# Patient Record
Sex: Female | Born: 1940
Health system: Southern US, Community
[De-identification: ages and names within clinical notes are randomized; demographics above are authoritative.]

## PROBLEM LIST (undated history)

## (undated) DIAGNOSIS — I10 Essential (primary) hypertension: Secondary | ICD-10-CM

## (undated) DIAGNOSIS — F32A Depression, unspecified: Secondary | ICD-10-CM

## (undated) DIAGNOSIS — R61 Generalized hyperhidrosis: Secondary | ICD-10-CM

## (undated) DIAGNOSIS — E119 Type 2 diabetes mellitus without complications: Secondary | ICD-10-CM

## (undated) HISTORY — PX: EYE SURGERY: SHX253

## (undated) HISTORY — DX: Generalized hyperhidrosis: R61

## (undated) HISTORY — PX: GALLBLADDER SURGERY: SHX652

## (undated) HISTORY — DX: Type 2 diabetes mellitus without complications: E11.9

## (undated) HISTORY — PX: OTHER SURGICAL HISTORY: SHX169

---

## 2002-08-04 ENCOUNTER — Ambulatory Visit (HOSPITAL_BASED_OUTPATIENT_CLINIC_OR_DEPARTMENT_OTHER): Admission: RE | Admit: 2002-08-04 | Discharge: 2002-08-04 | Payer: Self-pay | Admitting: Orthopedic Surgery

## 2002-09-03 ENCOUNTER — Ambulatory Visit (HOSPITAL_BASED_OUTPATIENT_CLINIC_OR_DEPARTMENT_OTHER): Admission: RE | Admit: 2002-09-03 | Discharge: 2002-09-03 | Payer: Self-pay | Admitting: Orthopedic Surgery

## 2004-05-17 ENCOUNTER — Encounter: Admission: RE | Admit: 2004-05-17 | Discharge: 2004-05-17 | Payer: Self-pay | Admitting: Internal Medicine

## 2004-09-09 ENCOUNTER — Encounter: Admission: RE | Admit: 2004-09-09 | Discharge: 2004-09-09 | Payer: Self-pay | Admitting: Neurosurgery

## 2013-03-04 ENCOUNTER — Ambulatory Visit (INDEPENDENT_AMBULATORY_CARE_PROVIDER_SITE_OTHER): Payer: Medicare Other

## 2013-03-04 VITALS — BP 148/86 | HR 85 | Resp 16

## 2013-03-04 DIAGNOSIS — R269 Unspecified abnormalities of gait and mobility: Secondary | ICD-10-CM

## 2013-03-04 DIAGNOSIS — M2020 Hallux rigidus, unspecified foot: Secondary | ICD-10-CM

## 2013-03-04 DIAGNOSIS — M1990 Unspecified osteoarthritis, unspecified site: Secondary | ICD-10-CM

## 2013-03-04 DIAGNOSIS — M79609 Pain in unspecified limb: Secondary | ICD-10-CM

## 2013-03-04 DIAGNOSIS — M79672 Pain in left foot: Secondary | ICD-10-CM

## 2013-03-04 DIAGNOSIS — M2022 Hallux rigidus, left foot: Secondary | ICD-10-CM

## 2013-03-04 MED ORDER — MELOXICAM 15 MG PO TABS: 15.0000 mg | ORAL_TABLET | Freq: Every day | ORAL | Status: DC

## 2013-03-04 NOTE — Patient Instructions (Signed)
Hallux Rigidus Hallux rigidus is a condition involving pain and a loss of motion of the first (big) toe. The pain gets worse with lifting up (extension) of the toe. This is usually due to arthritic bony bumps (spurring) of the joint at the base of the big toe.  SYMPTOMS   Pain, with lifting up of the toe.  Tenderness over the joint where the big toe meets the foot.  Redness, swelling, and warmth over the top of the base of the big toe (sometimes).  Foot pain, stiffness, and limping. CAUSES  Halllux rigidus is caused by arthritis of the joint where the big toe meets the foot. The arthritis creates a bone spur that pinches the soft tissues, when the toe is extended. RISK INCREASES WITH:  Tight shoes, with a narrow toe box.  Family history of foot problems.  Gout and rheumatoid and psoriatic arthritis.  History of previous toe injury, including "turf toe."  Long first toe, flat feet, and other big toe bony bumps.  Arthritis of the big toe. PREVENTION   Wear wide toed shoes that fit well.  Tape the big toe, to reduce motion and to prevent pinching of the tissues between the bone.  Maintain physical fitness:  Foot and ankle flexibility.  Muscle strength and endurance. PROGNOSIS  This condition can usually be managed with proper treatment. However, surgery is typically required to prevent the problem from recurring.  RELATED COMPLICATIONS  Injury to other areas of the foot or ankle, caused by abnormal walking in an attempt to avoid the pain felt when walking normally. TREATMENT Treatment first involves stopping the activities that aggravate your symptoms. Ice and medicine can be used to reduce the pain and inflammation. Modifications to shoes may help reduce pain, including wearing stiff-soled shoes, shoes with a wide toe box, inserting a padded donut to relieve pressure on top of the joint, or wearing an arch support. Corticosteroid injections may be given to reduce inflammation.  If non-surgical treatment is unsuccessful, surgery may be needed. Surgical options include removing the arthritic bony spur, cutting a bone in the foot to change the arc of motion (allowing the toe to extend more), or fusion of the joint (eliminating all motion in the joint at the base of the big toe).  MEDICATION   If pain medicine is needed, nonsteroidal anti-inflammatory medicines (aspirin and ibuprofen), or other minor pain relievers (acetaminophen), are often advised.  Do not take pain medicine for 7 days before surgery.  Prescription pain relievers are usually prescribed only after surgery. Use only as directed and only as much as you need.  Ointments for arthritis, applied to the skin, may give some relief.  Injections of corticosteroids may be given to reduce inflammation. HEAT AND COLD  Cold treatment (icing) relieves pain and reduces inflammation. Cold treatment should be applied for 10 to 15 minutes every 2 to 3 hours, and immediately after activity that aggravates your symptoms. Use ice packs or an ice massage.  Heat treatment may be used before performing the stretching and strengthening activities prescribed by your caregiver, physical therapist, or athletic trainer. Use a heat pack or a warm water soak. SEEK MEDICAL CARE IF:   Symptoms get worse or do not improve in 2 weeks, despite treatment.  After surgery you develop fever, increasing pain, redness, swelling, drainage of fluids, bleeding, or increasing warmth.  New, unexplained symptoms develop. (Drugs used in treatment may produce side effects.) Document Released: 04/16/2005 Document Revised: 07/09/2011 Document Reviewed: 07/29/2008 ExitCare Patient   Information 2014 [place], [place]. Osteoarthritis Osteoarthritis is the most common form of arthritis. It is redness, soreness, and swelling (inflammation) affecting the cartilage. Cartilage acts as a cushion, covering the ends of bones where they meet to form a joint. CAUSES   Over time, the cartilage begins to wear away. This causes bone to rub on bone. This produces pain and stiffness in the affected joints. Factors that contribute to this problem are:  Excessive body weight.  Age.  Overuse of joints. SYMPTOMS   People with osteoarthritis usually experience joint pain, swelling, or stiffness.  Over time, the joint may lose its normal shape.  Small deposits of bone (osteophytes) may grow on the edges of the joint.  Bits of bone or cartilage can break off and float inside the joint space. This may cause more pain and damage.  Osteoarthritis can lead to depression, anxiety, feelings of helplessness, and limitations on daily activities. The most commonly affected joints are in the:  Ends of the fingers.  Thumbs.  Neck.  Lower back.  Knees.  Hips. DIAGNOSIS  Diagnosis is mostly based on your symptoms and exam. Tests may be helpful, including:  X-rays of the affected joint.  A computerized magnetic scan (MRI).  Blood tests to rule out other types of arthritis.  Joint fluid tests. This involves using a needle to draw fluid from the joint and examining the fluid under a microscope. TREATMENT  Goals of treatment are to control pain, improve joint function, maintain a normal body weight, and maintain a healthy lifestyle. Treatment approaches may include:  A prescribed exercise program with rest and joint relief.  Weight control with nutritional education.  Pain relief techniques such as:  Properly applied heat and cold.  Electric pulses delivered to nerve endings under the skin (transcutaneous electrical nerve stimulation, TENS).  Massage.  Certain supplements. Ask your caregiver before using any supplements, especially in combination with prescribed drugs.  Medicines to control pain, such as:  Acetaminophen.  Nonsteroidal anti-inflammatory drugs (NSAIDs), such as naproxen.  Narcotic or central-acting agents, such as tramadol. This  drug carries a risk of addiction and is generally prescribed for short-term use.  Corticosteroids. These can be given orally or as injection. This is a short-term treatment, not recommended for routine use.  Surgery to reposition the bones and relieve pain (osteotomy) or to remove loose pieces of bone and cartilage. Joint replacement may be needed in advanced states of osteoarthritis. HOME CARE INSTRUCTIONS  Your caregiver can recommend specific types of exercise. These may include:  Strengthening exercises. These are done to strengthen the muscles that support joints affected by arthritis. They can be performed with weights or with exercise bands to add resistance.  Aerobic activities. These are exercises, such as brisk walking or low-impact aerobics, that get your heart pumping. They can help keep your lungs and circulatory system in shape.  Range-of-motion activities. These keep your joints limber.  Balance and agility exercises. These help you maintain daily living skills. Learning about your condition and being actively involved in your care will help improve the course of your osteoarthritis. SEEK MEDICAL CARE IF:   You feel hot or your skin turns red.  You develop a rash in addition to your joint pain.  You have an oral temperature above 102 F (38.9 C). [place] of Arthritis and Musculoskeletal and Skin Diseases: www.niams.SouthExposed.es [employer] on Aging: http://kim-miller.com/ American College of Rheumatology: www.rheumatology.org Document Released: 04/16/2005 Document Revised: 07/09/2011 Document Reviewed: 07/28/2009 ExitCare  Patient Information 2014 [place].  Specific instructions for her arthritic left great toe:  Maintain a stiff soled athletic shoe or sandal. No barefoot or flimsy shoes or flip-flops.  Avoid uneven surfaces or any excessive athletic type activities.  May apply warm compress and ice pack if there is any swelling and  persistent pain.  May require surgical intervention at some point in the future including possible joint replacement or joint effusion procedure.

## 2013-03-04 NOTE — Progress Notes (Signed)
  Subjective:    Patient ID: Kristin [name], female    DOB: [birth year], 72 y.o.   MRN: [id number]  HPI big toenail on left was taken off in December of last year and sore and tender and nail would never come back and feels like it has been jammed and aches at the tip of toe and i have bought a lot of shoes Patient has no history of injury or trauma however has stiffness and pain of left great toe joint for several months now.  Review of Systems  Constitutional: Negative.   HENT: Negative.   Eyes: Positive for itching.  Respiratory: Negative.   Cardiovascular:       Calf pain with walking  Gastrointestinal: Negative.   Endocrine: Negative.   Genitourinary: Negative.   Musculoskeletal:       Joint pain  Skin: Negative.   Allergic/Immunologic: Negative.   Neurological: Negative.   Hematological: Negative.   Psychiatric/Behavioral: Negative.        Objective:   Physical Exam Vascular status is intact with palpable pedal pulses DP post-ORIF 4 and PT postop over 4. Refill time 3 seconds all digits. Skin texture and turgor normal. Epicritic and proprioceptive sensations intact and symmetric bilateral. Orthopedic biomechanical rectus foot type significant swelling first MTP area left with limitation of motion dorsiflexion plantarflexion of the left great toe joint clinically. X-rays reveal asymmetric joint space narrowing first MTP joint left. There is some dorsal spurring first metatarsal head. Patient may have a slight hallux extensus is at the IP joint on lateral projection. This correlates with her clinical pain at the MTP joint has approximately 10-15 total motion. Lateral digits have good flexibility in motion dorsiflexion plantar flexion without restriction.       AssessmAssessment  & Plan:   Osteoarthritis with hallux rigidus first MTP area left foot. Patient recommended stiff soled shoe or sandal avoid any excessive activities or exercise programs. Patient will maintain warm  compress ice and prescription for Mobic 15 g daily as needed for pain. Followup in 2-3 months for possible surgical consultation and other alternatives may be candidate for implant arthroplasty her first MTP fusion based on progress her changes.  Harriet Masson DPM

## 2013-05-06 ENCOUNTER — Ambulatory Visit: Payer: Medicare Other

## 2013-09-01 ENCOUNTER — Telehealth: Payer: Self-pay | Admitting: *Deleted

## 2013-09-01 NOTE — Telephone Encounter (Signed)
Saw him back in January.  He recommended toe surgery.  I tried to put it off and put it off.  I think I have to have it done.  I don't know if I need to see him or what.  Please call back and talk to me.

## 2013-09-02 NOTE — Telephone Encounter (Signed)
Called patient left message to call us back and schedule an appointment

## 2013-09-09 ENCOUNTER — Ambulatory Visit (INDEPENDENT_AMBULATORY_CARE_PROVIDER_SITE_OTHER): Payer: Commercial Managed Care - HMO

## 2013-09-09 VITALS — BP 134/75 | HR 94 | Resp 18

## 2013-09-09 DIAGNOSIS — R269 Unspecified abnormalities of gait and mobility: Secondary | ICD-10-CM

## 2013-09-09 DIAGNOSIS — M79672 Pain in left foot: Secondary | ICD-10-CM

## 2013-09-09 DIAGNOSIS — M1990 Unspecified osteoarthritis, unspecified site: Secondary | ICD-10-CM

## 2013-09-09 DIAGNOSIS — M79609 Pain in unspecified limb: Secondary | ICD-10-CM

## 2013-09-09 DIAGNOSIS — M2020 Hallux rigidus, unspecified foot: Secondary | ICD-10-CM

## 2013-09-09 NOTE — Patient Instructions (Signed)
Pre-Operative Instructions  Congratulations, you have decided to take an important step to improving your quality of life.  You can be assured that the doctors of [facility] will be with you every step of the way.  1. Plan to be at the surgery center/hospital at least 1 (one) hour prior to your scheduled time unless otherwise directed by the surgical center/hospital staff.  You must have a responsible adult accompany you, remain during the surgery and drive you home.  Make sure you have directions to the surgical center/hospital and know how to get there on time. 2. For hospital based surgery you will need to obtain a history and physical form from your family physician within 1 month prior to the date of surgery- we will give you a form for you primary physician.  3. We make every effort to accommodate the date you request for surgery.  There are however, times where surgery dates or times have to be moved.  We will contact you as soon as possible if a change in schedule is required.   4. No Aspirin/Ibuprofen for one week before surgery.  If you are on aspirin, any non-steroidal anti-inflammatory medications (Mobic, Aleve, Ibuprofen) you should stop taking it 7 days prior to your surgery.  You make take Tylenol  For pain prior to surgery.  5. Medications- If you are taking daily heart and blood pressure medications, seizure, reflux, allergy, asthma, anxiety, pain or diabetes medications, make sure the surgery center/hospital is aware before the day of surgery so they may notify you which medications to take or avoid the day of surgery. 6. No food or drink after midnight the night before surgery unless directed otherwise by surgical center/hospital staff. 7. No alcoholic beverages 24 hours prior to surgery.  No smoking 24 hours prior to or 24 hours after surgery. 8. Wear loose pants or shorts- loose enough to fit over bandages, boots, and casts. 9. No slip on shoes, sneakers are best. 10. Bring  your boot with you to the surgery center/hospital.  Also bring crutches or a walker if your physician has prescribed it for you.  If you do not have this equipment, it will be provided for you after surgery. 11. If you have not been contracted by the surgery center/hospital by the day before your surgery, call to confirm the date and time of your surgery. 12. Leave-time from work may vary depending on the type of surgery you have.  Appropriate arrangements should be made prior to surgery with your employer. 13. Prescriptions will be provided immediately following surgery by your doctor.  Have these filled as soon as possible after surgery and take the medication as directed. 14. Remove nail polish on the operative foot. 15. Wash the night before surgery.  The night before surgery wash the foot and leg well with the antibacterial soap provided and water paying special attention to beneath the toenails and in between the toes.  Rinse thoroughly with water and dry well with a towel.  Perform this wash unless told not to do so by your physician.  Enclosed: 1 Ice pack (please put in freezer the night before surgery)   1 Hibiclens skin cleaner   Pre-op Instructions  If you have any questions regarding the instructions, do not hesitate to call our office.  [place]: [address] [place], [place] [address] [phone]  [place]: [address] [place], [place] [address] [phone]  [place]: [address]  [place], [place] [address] [phone]  Dr. Elliona Doddridge   Tuchman DPM, Dr. Norman Regal DPM Dr. Ireoluwa Grant DPM, Dr. M. Todd Hyatt DPM, Dr. Kathryn Egerton DPM 

## 2013-09-09 NOTE — Progress Notes (Signed)
   Subjective:    Patient ID: Kristin [name], female    DOB: 1941-01-26, 73 y.o.   MRN: [id number]  HPI My left foot hurts in this toe area near the big toe and burns and dull ache and feels like pins in it and tingles and no swelling    Review of Systems  Neurological: Positive for numbness.       I have neuropathy in left foot  Hematological: Bruises/bleeds easily.  All other systems reviewed and are negative.      Objective:   Physical Exam Lower extremity objective findings as follows should the patient is a 73 year old white female well-developed well-nourished oriented x3 mm distress is a painful left great toe joint which affected her gait and activities. No extremity objective findings as follows pedal pulses palpable DP +2/4 bilateral PT plus one over 4. Capillary refill time 3 seconds all digits skin temperature warm turgor normal no edema rubor pallor or varicosities noted. Neurologically epicritic and proprioceptive sensations appear to be intact there is some mild paresthesia to the toes and forefoot otherwise sensation intact on [employer] testing. Dermatologically skin color pigment normal hair growth absent nails somewhat criptotic orthopedic biomechanical exam rectus foot type bilateral left foot demonstrates almost complete reduction range of motion of left great toe joint posteriorly 510 motion with significant crepitus and pain and discomfort with walking attempted motion or palpation of the MTP joint. Trace from November reviewed reveal dorsal spurring asymmetric joint space narrowing first MTP joint with obliteration of the articular surface and joint space itself. There is a spurring of the metatarsal head radiographically and clinically the bony prominence of first MTP joint dorsally and medially with pain on palpation or attempted movement or ambulation. Patient is ready for surgical correction at this time in hopes of alleviating her pain in symptomology.         Assessment & Plan:  Assessment osteoarthritis with capsulitis and hallux rigidus first MTP joint left foot plan at this time consent form for Keller bunionectomy with implant arthroplasty recommended using a Silastic her Swanson type implant for the great toe joint. There are no contraindications the consent form is reviewed and signed at this time for surgery to be done at the [facility] surgical center under IV sedation and regional anesthetic block patient intraoperatively postop in a Darco shoe for proxy 1 month maybe annually immediately following surgery although moderate activities all risks questions asked by the patient's and discussion of risks and complications is carried out and consent forms are signed and surgery scheduled her convenience.  Harriet Masson DPM

## 2013-09-24 ENCOUNTER — Telehealth: Payer: Self-pay | Admitting: *Deleted

## 2013-09-24 NOTE — Telephone Encounter (Signed)
Patient filled out health history form on-line.  Kristin [name] she is having surgery on 10/05/2013.  We don't have a posting sheet.  Can you get that to [place] as soon as possible?

## 2013-10-05 DIAGNOSIS — M2020 Hallux rigidus, unspecified foot: Secondary | ICD-10-CM

## 2013-10-05 DIAGNOSIS — M19079 Primary osteoarthritis, unspecified ankle and foot: Secondary | ICD-10-CM

## 2013-10-08 ENCOUNTER — Ambulatory Visit (INDEPENDENT_AMBULATORY_CARE_PROVIDER_SITE_OTHER): Payer: Commercial Managed Care - HMO

## 2013-10-08 VITALS — BP 117/58 | HR 92 | Resp 18

## 2013-10-08 DIAGNOSIS — M2020 Hallux rigidus, unspecified foot: Secondary | ICD-10-CM

## 2013-10-08 DIAGNOSIS — M1990 Unspecified osteoarthritis, unspecified site: Secondary | ICD-10-CM

## 2013-10-08 DIAGNOSIS — Z09 Encounter for follow-up examination after completed treatment for conditions other than malignant neoplasm: Secondary | ICD-10-CM

## 2013-10-08 NOTE — Progress Notes (Signed)
   Subjective:    Patient ID: Kristin [name], female    DOB: 09-25-1940, 73 y.o.   MRN: [id number]  HPI I HAD SURGERY DONE ON 10/05/13 ON MY LEFT FOOT AND IT IS DOING GOOD SOME SWELLING AND I HAVE NOT HAD MUCH PAIN OR HAVE TAKEN MUCH MEDICINE    Review of Systems no new findings or systemic changes noted    Objective:   Physical Exam Neurovascular status is intact pedal pulses are palpable the patient is status post ORIF status post implant arthroplasty with placement of a silicone Silastic implant in the left great toe joint. Incision clean dry well coapted dressings intact and dry. Incision appears to be well coapted with mild edema mild ecchymosis consistent with postop course no dehiscence no discharge no drainage no signs of infection minimal pain or discomfort noted at this time. Patient is placed in new dry sterile compressive dressing and also demonstrates the patient appropriate active and passive range of motion exercises patient is advised to 100 toe pushups a day when the MTP joint as demonstrated to the patient maintain the dressing clean and dry       Assessment & Plan:  Assessment good postop progress 3-4 days status post Keller bunionectomy with implant arthroplasty left foot utilizing a silicone Silastic implant patient is having minimal or no pain excellent range of motion proximally 45 dorsiflexion 510 plantar flexion possible patient is advised to continue with range of motion exercises and ice daily reappointed one week plan for a dressing change at that time.  Harriet Masson DPM

## 2013-10-08 NOTE — Patient Instructions (Signed)
ICE INSTRUCTIONS  Apply ice or cold pack to the affected area at least 3 times a day for 10-15 minutes each time.  You should also use ice after prolonged activity or vigorous exercise.  Do not apply ice longer than 20 minutes at one time.  Always keep a cloth between your skin and the ice pack to prevent burns.  Being consistent and following these instructions will help control your symptoms.  We suggest you purchase a gel ice pack because they are reusable and do bit leak.  Some of them are designed to wrap around the area.  Use the method that works best for you.  Here are some other suggestions for icing.   Use a frozen bag of peas or corn-inexpensive and molds well to your body, usually stays frozen for 10 to 20 minutes.  Wet a towel with cold water and squeeze out the excess until it's damp.  Place in a bag in the freezer for 20 minutes. Then remove and use.   Also recommend toe exercises daily  Move the great toe joint 100 times daily divided until he is 3 or 4 sessions as needed fitting she remove the toe up and down at the ball of the foot not at the end of the toe also use ice and elevate whenever possible to keep down the swelling and bruising

## 2013-10-15 ENCOUNTER — Ambulatory Visit (INDEPENDENT_AMBULATORY_CARE_PROVIDER_SITE_OTHER): Payer: Commercial Managed Care - HMO

## 2013-10-15 VITALS — BP 128/71 | HR 91 | Resp 18

## 2013-10-15 DIAGNOSIS — M79672 Pain in left foot: Secondary | ICD-10-CM

## 2013-10-15 DIAGNOSIS — M79609 Pain in unspecified limb: Secondary | ICD-10-CM

## 2013-10-15 DIAGNOSIS — Z09 Encounter for follow-up examination after completed treatment for conditions other than malignant neoplasm: Secondary | ICD-10-CM

## 2013-10-15 DIAGNOSIS — M2020 Hallux rigidus, unspecified foot: Secondary | ICD-10-CM

## 2013-10-15 NOTE — Patient Instructions (Signed)
ICE INSTRUCTIONS  Apply ice or cold pack to the affected area at least 3 times a day for 10-15 minutes each time.  You should also use ice after prolonged activity or vigorous exercise.  Do not apply ice longer than 20 minutes at one time.  Always keep a cloth between your skin and the ice pack to prevent burns.  Being consistent and following these instructions will help control your symptoms.  We suggest you purchase a gel ice pack because they are reusable and do bit leak.  Some of them are designed to wrap around the area.  Use the method that works best for you.  Here are some other suggestions for icing.   Use a frozen bag of peas or corn-inexpensive and molds well to your body, usually stays frozen for 10 to 20 minutes.  Wet a towel with cold water and squeeze out the excess until it's damp.  Place in a bag in the freezer for 20 minutes. Then remove and use.  May resume normal bathing or shower quick shower bath 10 minutes,  no prolonged soaking. Recommended Tylenol or Advil as needed for pain. Continue range of motion movement exercises daily as instructed. Less maintain surgical shoe never walking or standing. Maintain compression stocking during the day can remove every evening  Return in 4 weeks for postop followup and x-ray

## 2013-10-15 NOTE — Progress Notes (Signed)
   Subjective:    Patient ID: Kristin [name], female    DOB: Dec 22, 1940, 73 y.o.   MRN: [id number]  HPI I HAD SURGERY ON MY LEFT FOOT ON 10/05/13 AND IT BETTER BUT IT DOES SWELL SOME AND I HOPE I CAN GET IT WET    Review of Systems no new findings or systemic changes noted    Objective:   Physical Exam Patient is ten-day status post arthrotomy, to the first MTP area left foot. Patient is doing well minimal pain or discomfort mild ecchymosis noted mild edema noted patient been doing wrap active range of motion and passive range of motion exercises as instructed and will continue to do so for the next month. Incision well coapted no dehiscence no discharge or drainage maintain Neosporin cocoa butter to the incision also dispensed anklet for compression at this time.       Assessment & Plan:  Assessment good postop progress no dehiscence no signs of infection range of motion of the hallux continue with range of motion exercises stretching and dressing changes maintain compression stocking and Darco shoe. Return in one month for followup  Harriet Masson DPM

## 2013-11-26 ENCOUNTER — Ambulatory Visit (INDEPENDENT_AMBULATORY_CARE_PROVIDER_SITE_OTHER): Payer: Commercial Managed Care - HMO

## 2013-11-26 VITALS — BP 143/76 | HR 86 | Resp 18

## 2013-11-26 DIAGNOSIS — M19079 Primary osteoarthritis, unspecified ankle and foot: Secondary | ICD-10-CM

## 2013-11-26 DIAGNOSIS — M2022 Hallux rigidus, left foot: Secondary | ICD-10-CM

## 2013-11-26 DIAGNOSIS — M2020 Hallux rigidus, unspecified foot: Secondary | ICD-10-CM

## 2013-11-26 DIAGNOSIS — E1149 Type 2 diabetes mellitus with other diabetic neurological complication: Secondary | ICD-10-CM

## 2013-11-26 DIAGNOSIS — M19072 Primary osteoarthritis, left ankle and foot: Secondary | ICD-10-CM

## 2013-11-26 DIAGNOSIS — Z09 Encounter for follow-up examination after completed treatment for conditions other than malignant neoplasm: Secondary | ICD-10-CM

## 2013-11-26 DIAGNOSIS — E1140 Type 2 diabetes mellitus with diabetic neuropathy, unspecified: Secondary | ICD-10-CM

## 2013-11-26 DIAGNOSIS — G609 Hereditary and idiopathic neuropathy, unspecified: Secondary | ICD-10-CM

## 2013-11-26 DIAGNOSIS — E1142 Type 2 diabetes mellitus with diabetic polyneuropathy: Secondary | ICD-10-CM

## 2013-11-26 DIAGNOSIS — G629 Polyneuropathy, unspecified: Secondary | ICD-10-CM

## 2013-11-26 MED ORDER — GABAPENTIN 300 MG PO CAPS: 300.0000 mg | ORAL_CAPSULE | Freq: Every day | ORAL | Status: DC

## 2013-11-26 NOTE — Patient Instructions (Signed)

## 2013-11-26 NOTE — Progress Notes (Signed)
   Subjective:    Patient ID: Kristin [name], female    DOB: Oct 31, 1940, 73 y.o.   MRN: [id number]  HPI I HAD SURGERY ON 10/05/13 ON MY LEFT FOOT AND IT HURTS SOME ON TOP AND LETS ME KNOW IF I AM ON IT TOO MUCH    Review of Systems neurovascular status is intact no new systemic changes or findings.     Objective:   Physical Exam Lower extremity objective findings reveal intact neurovascular status pedal pulses palpable DP and PT posterior were for left foot capillary fill time 3 seconds all digits incision well coapted hallux is in good alignment excellent range of motion approximately 45 dorsiflexion 510 plantar flexion no crepitus no pain occasionally has some soreness when she's on her feet for extended period of time although in general is very pleased minimal or no pain discomfort doing all her regular activities without restrictions wearing comfortable [place] or slip on type shoes. X-rays revealed alignment of the implant following joint replacement arthroplasty first MTP joint left foot.       Assessment & Plan:  Assessment good postop progress following implant arthroplasty first MTP area left recheck in 3 months for long-term followup maintain continued active range of motion exercises maintain good stable shoes. Only other concern pointed out at the visit today his patient is having some difficulty sleeping due to the neuropathy burning stinging tingling associated with her diabetes. She is taking vitamin supplements and vitamins he and B complex corn that hasn't helped at this time patient is given a prescription for Neurontin 300 mg each bedtime x30 days with 3 refills. Such Rt. seatbelts improve with her peripheral neuropathy pain and neuralgia at night. Followup in 3 months as scheduled  Harriet Masson DPM

## 2014-02-25 ENCOUNTER — Ambulatory Visit (INDEPENDENT_AMBULATORY_CARE_PROVIDER_SITE_OTHER): Payer: Commercial Managed Care - HMO

## 2014-02-25 VITALS — BP 143/74 | HR 86 | Resp 12

## 2014-02-25 DIAGNOSIS — E1140 Type 2 diabetes mellitus with diabetic neuropathy, unspecified: Secondary | ICD-10-CM

## 2014-02-25 DIAGNOSIS — Z09 Encounter for follow-up examination after completed treatment for conditions other than malignant neoplasm: Secondary | ICD-10-CM

## 2014-02-25 DIAGNOSIS — G629 Polyneuropathy, unspecified: Secondary | ICD-10-CM

## 2014-02-25 DIAGNOSIS — M79672 Pain in left foot: Secondary | ICD-10-CM

## 2014-02-25 MED ORDER — GABAPENTIN 300 MG PO CAPS: 300.0000 mg | ORAL_CAPSULE | Freq: Three times a day (TID) | ORAL | Status: DC

## 2014-02-25 NOTE — Progress Notes (Signed)
   Subjective:    Patient ID: Kristin [name], female    DOB: 08/26/1940, 73 y.o.   MRN: [id number]  HPI  ''LT FOOT STILL HAVING BURNING AND TINGLING SENSATION. ALSO, THE RX.NEUROTIN HELP WHEN TAKING IT.''  Review of Systems no new findings for systemic changes noted     Objective:   Physical Exam Neurovascular status is unchanged patient does have improvement when she is on gabapentin at bedtime that dose was helping think she would benefit from taking some during the day which is what we anticipate starting at this time. Patient continues to have good range of motion of the first MTP area left proximally 3540 dorsiflexion 510 plantar flexion of crepitus or pain the postop long-term progress noted on the left foot following implant arthroplasty. However continues to have diabetic neuropathy and neuritis is taking multivitamins in B complex and folic acid managing her diabetes well patient is again reminded that neuropathy is not to be [place] however can be managed with the medication. Patient is advised that gabapentin is just an older version of the Lyrica similar effects and side effects with gabapentin being generic and more affordable.       Assessment & Plan:  Assessment diabetic peripheral neuropathy and neuritis. Improved with gabapentin at this time will titrate up to gabapentin 3 mg 3 times a day Prescriptions are given with 6 month refills gabapentin 3 mg 3 times a day. Also continue with vitamins with appropriate accommodative shoes and maintain her diabetes management as as well as possible. Recheck in 6 months for reassessment next  Harriet Masson DPM

## 2014-02-25 NOTE — Patient Instructions (Signed)
Peripheral Neuropathy Peripheral neuropathy is a type of nerve damage. It affects nerves that carry signals between the spinal cord and other parts of the body. These are called peripheral nerves. With peripheral neuropathy, one nerve or a group of nerves may be damaged.  CAUSES  Many things can damage peripheral nerves. For some people with peripheral neuropathy, the cause is unknown. Some causes include:  Diabetes. This is the most common cause of peripheral neuropathy.  Injury to a nerve.  Pressure or stress on a nerve that lasts a long time.  Too little vitamin B. Alcoholism can lead to this.  Infections.  Autoimmune diseases, such as multiple sclerosis and systemic lupus erythematosus.  Inherited nerve diseases.  Some medicines, such as cancer drugs.  Toxic substances, such as lead and mercury.  Too little blood flowing to the legs.  Kidney disease.  Thyroid disease. SIGNS AND SYMPTOMS  Different people have different symptoms. The symptoms you have will depend on which of your nerves is damaged. Common symptoms include:  Loss of feeling (numbness) in the feet and hands.  Tingling in the feet and hands.  Pain that burns.  Very sensitive skin.  Weakness.  Not being able to move a part of the body (paralysis).  Muscle twitching.  Clumsiness or poor coordination.  Loss of balance.  Not being able to control your bladder.  Feeling dizzy.  Sexual problems. DIAGNOSIS  Peripheral neuropathy is a symptom, not a disease. Finding the cause of peripheral neuropathy can be hard. To figure that out, your health care provider will take a medical history and do a physical exam. A neurological exam will also be done. This involves checking things affected by your brain, spinal cord, and nerves (nervous system). For example, your health care provider will check your reflexes, how you move, and what you can feel.  Other types of tests may also be ordered, such as:  Blood  tests.  A test of the fluid in your spinal cord.  Imaging tests, such as CT scans or an MRI.  Electromyography (EMG). This test checks the nerves that control muscles.  Nerve conduction velocity tests. These tests check how fast messages pass through your nerves.  Nerve biopsy. A small piece of nerve is removed. It is then checked under a microscope. TREATMENT   Medicine is often used to treat peripheral neuropathy. Medicines may include:  Pain-relieving medicines. Prescription or over-the-counter medicine may be suggested.  Antiseizure medicine. This may be used for pain.  Antidepressants. These also may help ease pain from neuropathy.  Lidocaine. This is a numbing medicine. You might wear a patch or be given a shot.  Mexiletine. This medicine is typically used to help control irregular heart rhythms.  Surgery. Surgery may be needed to relieve pressure on a nerve or to destroy a nerve that is causing pain.  Physical therapy to help movement.  Assistive devices to help movement. HOME CARE INSTRUCTIONS   Only take over-the-counter or prescription medicines as directed by your health care provider. Follow the instructions carefully for any given medicines. Do not take any other medicines without first getting approval from your health care provider.  If you have diabetes, work closely with your health care provider to keep your blood sugar under control.  If you have numbness in your feet:  Check every day for signs of injury or infection. Watch for redness, warmth, and swelling.  Wear padded socks and comfortable shoes. These help protect your feet.  Do not do   things that put pressure on your damaged nerve.  Do not smoke. Smoking keeps blood from getting to damaged nerves.  Avoid or limit alcohol. Too much alcohol can cause a lack of B vitamins. These vitamins are needed for healthy nerves.  Develop a good support system. Coping with peripheral neuropathy can be  stressful. Talk to a mental health specialist or join a support group if you are struggling.  Follow up with your health care provider as directed. SEEK MEDICAL CARE IF:   You have new signs or symptoms of peripheral neuropathy.  You are struggling emotionally from dealing with peripheral neuropathy.  You have a fever. SEEK IMMEDIATE MEDICAL CARE IF:   You have an injury or infection that is not healing.  You feel very dizzy or begin vomiting.  You have chest pain.  You have trouble breathing. Document Released: 04/06/2002 Document Revised: 12/27/2010 Document Reviewed: 12/22/2012 [facility] Patient Information 2015 [place], [place]. This information is not intended to replace advice given to you by your health care provider. Make sure you discuss any questions you have with your health care provider.    Diabetes and Foot Care Diabetes may cause you to have problems because of poor blood supply (circulation) to your feet and legs. This may cause the skin on your feet to become thinner, break easier, and heal more slowly. Your skin may become dry, and the skin may peel and crack. You may also have nerve damage in your legs and feet causing decreased feeling in them. You may not notice minor injuries to your feet that could lead to infections or more serious problems. Taking care of your feet is one of the most important things you can do for yourself.  HOME CARE INSTRUCTIONS  Wear shoes at all times, even in the house. Do not go barefoot. Bare feet are easily injured.  Check your feet daily for blisters, cuts, and redness. If you cannot see the bottom of your feet, use a mirror or ask someone for help.  Wash your feet with warm water (do not use hot water) and mild soap. Then pat your feet and the areas between your toes until they are completely dry. Do not soak your feet as this can dry your skin.  Apply a moisturizing lotion or petroleum jelly (that does not contain alcohol and is  unscented) to the skin on your feet and to dry, brittle toenails. Do not apply lotion between your toes.  Trim your toenails straight across. Do not dig under them or around the cuticle. File the edges of your nails with an emery board or nail file.  Do not cut corns or calluses or try to remove them with medicine.  Wear clean socks or stockings every day. Make sure they are not too tight. Do not wear knee-high stockings since they may decrease blood flow to your legs.  Wear shoes that fit properly and have enough cushioning. To break in new shoes, wear them for just a few hours a day. This prevents you from injuring your feet. Always look in your shoes before you put them on to be sure there are no objects inside.  Do not cross your legs. This may decrease the blood flow to your feet.  If you find a minor scrape, cut, or break in the skin on your feet, keep it and the skin around it clean and dry. These areas may be cleansed with mild soap and water. Do not cleanse the area with peroxide, alcohol,  or iodine.  When you remove an adhesive bandage, be sure not to damage the skin around it.  If you have a wound, look at it several times a day to make sure it is healing.  Do not use heating pads or hot water bottles. They may burn your skin. If you have lost feeling in your feet or legs, you may not know it is happening until it is too late.  Make sure your health care provider performs a complete foot exam at least annually or more often if you have foot problems. Report any cuts, sores, or bruises to your health care provider immediately. SEEK MEDICAL CARE IF:   You have an injury that is not healing.  You have cuts or breaks in the skin.  You have an ingrown nail.  You notice redness on your legs or feet.  You feel burning or tingling in your legs or feet.  You have pain or cramps in your legs and feet.  Your legs or feet are numb.  Your feet always feel cold. SEEK IMMEDIATE  MEDICAL CARE IF:   There is increasing redness, swelling, or pain in or around a wound.  There is a red line that goes up your leg.  Pus is coming from a wound.  You develop a fever or as directed by your health care provider.  You notice a bad smell coming from an ulcer or wound. Document Released: 04/13/2000 Document Revised: 12/17/2012 Document Reviewed: 09/23/2012 [facility] Patient Information 2015 [place], [place]. This information is not intended to replace advice given to you by your health care provider. Make sure you discuss any questions you have with your health care provider.

## 2014-07-15 DIAGNOSIS — K219 Gastro-esophageal reflux disease without esophagitis: Secondary | ICD-10-CM | POA: Diagnosis not present

## 2014-07-15 DIAGNOSIS — I10 Essential (primary) hypertension: Secondary | ICD-10-CM | POA: Diagnosis not present

## 2014-07-15 DIAGNOSIS — Z79899 Other long term (current) drug therapy: Secondary | ICD-10-CM | POA: Diagnosis not present

## 2014-07-15 DIAGNOSIS — E1165 Type 2 diabetes mellitus with hyperglycemia: Secondary | ICD-10-CM | POA: Diagnosis not present

## 2014-07-15 DIAGNOSIS — E1140 Type 2 diabetes mellitus with diabetic neuropathy, unspecified: Secondary | ICD-10-CM | POA: Diagnosis not present

## 2014-07-15 DIAGNOSIS — E785 Hyperlipidemia, unspecified: Secondary | ICD-10-CM | POA: Diagnosis not present

## 2014-08-26 ENCOUNTER — Encounter: Payer: Self-pay | Admitting: Podiatrist

## 2014-08-26 ENCOUNTER — Ambulatory Visit (INDEPENDENT_AMBULATORY_CARE_PROVIDER_SITE_OTHER): Payer: Commercial Managed Care - HMO | Admitting: Podiatrist

## 2014-08-26 VITALS — BP 106/46 | HR 93 | Resp 18

## 2014-08-26 DIAGNOSIS — G629 Polyneuropathy, unspecified: Secondary | ICD-10-CM

## 2014-08-26 DIAGNOSIS — M25472 Effusion, left ankle: Secondary | ICD-10-CM | POA: Diagnosis not present

## 2014-08-26 DIAGNOSIS — E1140 Type 2 diabetes mellitus with diabetic neuropathy, unspecified: Secondary | ICD-10-CM

## 2014-08-26 MED ORDER — GABAPENTIN 300 MG PO CAPS: 300.0000 mg | ORAL_CAPSULE | Freq: Three times a day (TID) | ORAL | Status: DC

## 2014-08-26 NOTE — Progress Notes (Signed)
   Subjective:    Patient ID: Kristin [name], female    DOB: 1941-01-31, 74 y.o.   MRN: [id number] Chief Complaint  Patient presents with  . Foot Problem    I AM HERE TO HAVE A RECHECK OF MY FEET AND MY LEFT ANKLE SWELLS SOME    She presents today for neuropathy recheck. She states she is doing well with the Neurontin and is pleased with her progress. She also relates swelling on her left ankle with no acute trauma or injury reported.    Objective:   Physical Exam Neurovascular status unchanged peripheral neuropathy noted. She is doing well with gabapentin 300 mg 3 times daily. Moderate swelling on the left ankle is noted no pain along the lateral ankle is palpated. Some tightness and tenderness along the anterior ankle is reported subjectively.       Assessment & Plan:  Assessment diabetic peripheral neuropathy and neuritis. Improved with gabapentin   Plan: Recommended keeping her on gabapentin 300 mg 3 times daily.  Her prescription was written for her. She will call if any concerns or otherwise she'll be seen back as needed for follow-up.

## 2014-10-08 DIAGNOSIS — J189 Pneumonia, unspecified organism: Secondary | ICD-10-CM | POA: Diagnosis not present

## 2014-10-15 DIAGNOSIS — I10 Essential (primary) hypertension: Secondary | ICD-10-CM | POA: Diagnosis not present

## 2014-10-15 DIAGNOSIS — R062 Wheezing: Secondary | ICD-10-CM | POA: Diagnosis not present

## 2014-10-15 DIAGNOSIS — Z79899 Other long term (current) drug therapy: Secondary | ICD-10-CM | POA: Diagnosis not present

## 2014-10-15 DIAGNOSIS — E785 Hyperlipidemia, unspecified: Secondary | ICD-10-CM | POA: Diagnosis not present

## 2014-10-15 DIAGNOSIS — K219 Gastro-esophageal reflux disease without esophagitis: Secondary | ICD-10-CM | POA: Diagnosis not present

## 2014-10-15 DIAGNOSIS — E1165 Type 2 diabetes mellitus with hyperglycemia: Secondary | ICD-10-CM | POA: Diagnosis not present

## 2014-10-21 DIAGNOSIS — R918 Other nonspecific abnormal finding of lung field: Secondary | ICD-10-CM | POA: Diagnosis not present

## 2014-10-21 DIAGNOSIS — J9801 Acute bronchospasm: Secondary | ICD-10-CM | POA: Diagnosis not present

## 2014-10-21 DIAGNOSIS — R05 Cough: Secondary | ICD-10-CM | POA: Diagnosis not present

## 2015-01-21 DIAGNOSIS — J0100 Acute maxillary sinusitis, unspecified: Secondary | ICD-10-CM | POA: Diagnosis not present

## 2015-02-14 DIAGNOSIS — I10 Essential (primary) hypertension: Secondary | ICD-10-CM | POA: Diagnosis not present

## 2015-02-14 DIAGNOSIS — Z79899 Other long term (current) drug therapy: Secondary | ICD-10-CM | POA: Diagnosis not present

## 2015-02-14 DIAGNOSIS — R350 Frequency of micturition: Secondary | ICD-10-CM | POA: Diagnosis not present

## 2015-02-14 DIAGNOSIS — E785 Hyperlipidemia, unspecified: Secondary | ICD-10-CM | POA: Diagnosis not present

## 2015-02-14 DIAGNOSIS — E1142 Type 2 diabetes mellitus with diabetic polyneuropathy: Secondary | ICD-10-CM | POA: Diagnosis not present

## 2015-03-31 DIAGNOSIS — H5210 Myopia, unspecified eye: Secondary | ICD-10-CM | POA: Diagnosis not present

## 2015-03-31 DIAGNOSIS — E113293 Type 2 diabetes mellitus with mild nonproliferative diabetic retinopathy without macular edema, bilateral: Secondary | ICD-10-CM | POA: Diagnosis not present

## 2015-03-31 DIAGNOSIS — H52221 Regular astigmatism, right eye: Secondary | ICD-10-CM | POA: Diagnosis not present

## 2015-05-06 DIAGNOSIS — E119 Type 2 diabetes mellitus without complications: Secondary | ICD-10-CM | POA: Diagnosis not present

## 2015-05-06 DIAGNOSIS — K219 Gastro-esophageal reflux disease without esophagitis: Secondary | ICD-10-CM | POA: Diagnosis not present

## 2015-06-16 DIAGNOSIS — Z1211 Encounter for screening for malignant neoplasm of colon: Secondary | ICD-10-CM | POA: Diagnosis not present

## 2015-06-16 DIAGNOSIS — Z86010 Personal history of colonic polyps: Secondary | ICD-10-CM | POA: Diagnosis not present

## 2015-08-04 DIAGNOSIS — E119 Type 2 diabetes mellitus without complications: Secondary | ICD-10-CM | POA: Diagnosis not present

## 2015-08-04 DIAGNOSIS — Z86010 Personal history of colonic polyps: Secondary | ICD-10-CM | POA: Diagnosis not present

## 2015-08-04 DIAGNOSIS — K589 Irritable bowel syndrome without diarrhea: Secondary | ICD-10-CM | POA: Diagnosis not present

## 2015-08-04 DIAGNOSIS — K219 Gastro-esophageal reflux disease without esophagitis: Secondary | ICD-10-CM | POA: Diagnosis not present

## 2015-08-04 DIAGNOSIS — K648 Other hemorrhoids: Secondary | ICD-10-CM | POA: Diagnosis not present

## 2015-08-04 DIAGNOSIS — K5730 Diverticulosis of large intestine without perforation or abscess without bleeding: Secondary | ICD-10-CM | POA: Diagnosis not present

## 2015-08-04 DIAGNOSIS — I10 Essential (primary) hypertension: Secondary | ICD-10-CM | POA: Diagnosis not present

## 2015-08-04 DIAGNOSIS — E785 Hyperlipidemia, unspecified: Secondary | ICD-10-CM | POA: Diagnosis not present

## 2015-08-04 DIAGNOSIS — Z1211 Encounter for screening for malignant neoplasm of colon: Secondary | ICD-10-CM | POA: Diagnosis not present

## 2015-08-04 DIAGNOSIS — F419 Anxiety disorder, unspecified: Secondary | ICD-10-CM | POA: Diagnosis not present

## 2015-08-04 DIAGNOSIS — Z09 Encounter for follow-up examination after completed treatment for conditions other than malignant neoplasm: Secondary | ICD-10-CM | POA: Diagnosis not present

## 2015-08-16 DIAGNOSIS — F325 Major depressive disorder, single episode, in full remission: Secondary | ICD-10-CM | POA: Diagnosis not present

## 2015-08-16 DIAGNOSIS — E785 Hyperlipidemia, unspecified: Secondary | ICD-10-CM | POA: Diagnosis not present

## 2015-08-16 DIAGNOSIS — E1142 Type 2 diabetes mellitus with diabetic polyneuropathy: Secondary | ICD-10-CM | POA: Diagnosis not present

## 2015-08-16 DIAGNOSIS — K219 Gastro-esophageal reflux disease without esophagitis: Secondary | ICD-10-CM | POA: Diagnosis not present

## 2015-08-16 DIAGNOSIS — Z79899 Other long term (current) drug therapy: Secondary | ICD-10-CM | POA: Diagnosis not present

## 2015-08-16 DIAGNOSIS — N390 Urinary tract infection, site not specified: Secondary | ICD-10-CM | POA: Diagnosis not present

## 2015-08-16 DIAGNOSIS — I10 Essential (primary) hypertension: Secondary | ICD-10-CM | POA: Diagnosis not present

## 2015-09-07 ENCOUNTER — Telehealth: Payer: Self-pay | Admitting: *Deleted

## 2015-09-07 DIAGNOSIS — N390 Urinary tract infection, site not specified: Secondary | ICD-10-CM | POA: Diagnosis not present

## 2015-09-07 MED ORDER — GABAPENTIN 300 MG PO CAPS: 300.0000 mg | ORAL_CAPSULE | Freq: Three times a day (TID) | ORAL | Status: DC

## 2015-09-07 NOTE — Telephone Encounter (Signed)
Faxed request for Gabapentin 300mg  #90 one capsule tid received.  Dr. Paulla Dolly states pt needs an appt refill for 30 days, to be established with a doctor.

## 2015-10-10 DIAGNOSIS — E113411 Type 2 diabetes mellitus with severe nonproliferative diabetic retinopathy with macular edema, right eye: Secondary | ICD-10-CM | POA: Diagnosis not present

## 2015-11-07 DIAGNOSIS — E113411 Type 2 diabetes mellitus with severe nonproliferative diabetic retinopathy with macular edema, right eye: Secondary | ICD-10-CM | POA: Diagnosis not present

## 2015-11-12 DIAGNOSIS — N3090 Cystitis, unspecified without hematuria: Secondary | ICD-10-CM | POA: Diagnosis not present

## 2015-11-12 DIAGNOSIS — R300 Dysuria: Secondary | ICD-10-CM | POA: Diagnosis not present

## 2015-12-08 DIAGNOSIS — E113411 Type 2 diabetes mellitus with severe nonproliferative diabetic retinopathy with macular edema, right eye: Secondary | ICD-10-CM | POA: Diagnosis not present

## 2015-12-20 DIAGNOSIS — E162 Hypoglycemia, unspecified: Secondary | ICD-10-CM | POA: Diagnosis not present

## 2015-12-20 DIAGNOSIS — E161 Other hypoglycemia: Secondary | ICD-10-CM | POA: Diagnosis not present

## 2015-12-20 DIAGNOSIS — E11649 Type 2 diabetes mellitus with hypoglycemia without coma: Secondary | ICD-10-CM | POA: Diagnosis not present

## 2015-12-20 DIAGNOSIS — R404 Transient alteration of awareness: Secondary | ICD-10-CM | POA: Diagnosis not present

## 2015-12-20 DIAGNOSIS — Z794 Long term (current) use of insulin: Secondary | ICD-10-CM | POA: Diagnosis not present

## 2015-12-21 DIAGNOSIS — E1142 Type 2 diabetes mellitus with diabetic polyneuropathy: Secondary | ICD-10-CM | POA: Diagnosis not present

## 2016-01-08 DIAGNOSIS — S2232XA Fracture of one rib, left side, initial encounter for closed fracture: Secondary | ICD-10-CM | POA: Diagnosis not present

## 2016-01-08 DIAGNOSIS — S20229A Contusion of unspecified back wall of thorax, initial encounter: Secondary | ICD-10-CM | POA: Diagnosis not present

## 2016-01-09 DIAGNOSIS — E113411 Type 2 diabetes mellitus with severe nonproliferative diabetic retinopathy with macular edema, right eye: Secondary | ICD-10-CM | POA: Diagnosis not present

## 2016-01-11 DIAGNOSIS — S3991XA Unspecified injury of abdomen, initial encounter: Secondary | ICD-10-CM | POA: Diagnosis not present

## 2016-01-11 DIAGNOSIS — K5900 Constipation, unspecified: Secondary | ICD-10-CM | POA: Diagnosis not present

## 2016-01-11 DIAGNOSIS — E1165 Type 2 diabetes mellitus with hyperglycemia: Secondary | ICD-10-CM | POA: Diagnosis not present

## 2016-01-11 DIAGNOSIS — R0781 Pleurodynia: Secondary | ICD-10-CM | POA: Diagnosis not present

## 2016-01-11 DIAGNOSIS — Z794 Long term (current) use of insulin: Secondary | ICD-10-CM | POA: Diagnosis not present

## 2016-01-11 DIAGNOSIS — S301XXA Contusion of abdominal wall, initial encounter: Secondary | ICD-10-CM | POA: Diagnosis not present

## 2016-01-11 DIAGNOSIS — S299XXA Unspecified injury of thorax, initial encounter: Secondary | ICD-10-CM | POA: Diagnosis not present

## 2016-01-23 DIAGNOSIS — E878 Other disorders of electrolyte and fluid balance, not elsewhere classified: Secondary | ICD-10-CM | POA: Diagnosis not present

## 2016-01-25 DIAGNOSIS — E1165 Type 2 diabetes mellitus with hyperglycemia: Secondary | ICD-10-CM | POA: Diagnosis not present

## 2016-01-25 DIAGNOSIS — N390 Urinary tract infection, site not specified: Secondary | ICD-10-CM | POA: Diagnosis not present

## 2016-01-25 DIAGNOSIS — Z1231 Encounter for screening mammogram for malignant neoplasm of breast: Secondary | ICD-10-CM | POA: Diagnosis not present

## 2016-02-02 DIAGNOSIS — Z1231 Encounter for screening mammogram for malignant neoplasm of breast: Secondary | ICD-10-CM | POA: Diagnosis not present

## 2016-02-13 DIAGNOSIS — E113411 Type 2 diabetes mellitus with severe nonproliferative diabetic retinopathy with macular edema, right eye: Secondary | ICD-10-CM | POA: Diagnosis not present

## 2016-02-23 DIAGNOSIS — E113313 Type 2 diabetes mellitus with moderate nonproliferative diabetic retinopathy with macular edema, bilateral: Secondary | ICD-10-CM | POA: Diagnosis not present

## 2016-02-23 DIAGNOSIS — H3582 Retinal ischemia: Secondary | ICD-10-CM | POA: Diagnosis not present

## 2016-03-26 DIAGNOSIS — H43819 Vitreous degeneration, unspecified eye: Secondary | ICD-10-CM | POA: Diagnosis not present

## 2016-03-26 DIAGNOSIS — E113313 Type 2 diabetes mellitus with moderate nonproliferative diabetic retinopathy with macular edema, bilateral: Secondary | ICD-10-CM | POA: Diagnosis not present

## 2016-03-26 DIAGNOSIS — H3582 Retinal ischemia: Secondary | ICD-10-CM | POA: Diagnosis not present

## 2016-04-18 DIAGNOSIS — I10 Essential (primary) hypertension: Secondary | ICD-10-CM | POA: Diagnosis not present

## 2016-04-18 DIAGNOSIS — Z79899 Other long term (current) drug therapy: Secondary | ICD-10-CM | POA: Diagnosis not present

## 2016-04-18 DIAGNOSIS — E785 Hyperlipidemia, unspecified: Secondary | ICD-10-CM | POA: Diagnosis not present

## 2016-04-18 DIAGNOSIS — K219 Gastro-esophageal reflux disease without esophagitis: Secondary | ICD-10-CM | POA: Diagnosis not present

## 2016-04-18 DIAGNOSIS — F325 Major depressive disorder, single episode, in full remission: Secondary | ICD-10-CM | POA: Diagnosis not present

## 2016-04-18 DIAGNOSIS — E1142 Type 2 diabetes mellitus with diabetic polyneuropathy: Secondary | ICD-10-CM | POA: Diagnosis not present

## 2016-04-20 DIAGNOSIS — E1142 Type 2 diabetes mellitus with diabetic polyneuropathy: Secondary | ICD-10-CM | POA: Diagnosis not present

## 2016-05-11 ENCOUNTER — Encounter: Payer: Self-pay | Admitting: Sports Medicine

## 2016-05-11 ENCOUNTER — Ambulatory Visit (INDEPENDENT_AMBULATORY_CARE_PROVIDER_SITE_OTHER): Payer: Medicare HMO | Admitting: Sports Medicine

## 2016-05-11 DIAGNOSIS — I739 Peripheral vascular disease, unspecified: Secondary | ICD-10-CM

## 2016-05-11 DIAGNOSIS — B351 Tinea unguium: Secondary | ICD-10-CM

## 2016-05-11 DIAGNOSIS — L84 Corns and callosities: Secondary | ICD-10-CM

## 2016-05-11 DIAGNOSIS — E1140 Type 2 diabetes mellitus with diabetic neuropathy, unspecified: Secondary | ICD-10-CM | POA: Diagnosis not present

## 2016-05-11 DIAGNOSIS — M2040 Other hammer toe(s) (acquired), unspecified foot: Secondary | ICD-10-CM | POA: Diagnosis not present

## 2016-05-11 DIAGNOSIS — Z0189 Encounter for other specified special examinations: Secondary | ICD-10-CM

## 2016-05-11 DIAGNOSIS — E119 Type 2 diabetes mellitus without complications: Secondary | ICD-10-CM

## 2016-05-11 NOTE — Progress Notes (Signed)
Subjective: Kristin [name] is a 76 y.o. female patient with history of diabetes who presents to office today complaining of corn on left in between toes, long, painful nails  while ambulating in shoes; unable to trim. Patient states that the glucose reading this morning was good usually runs 120-150s. Patient denies any new changes in medication or new problems. Patient denies any new cramping, numbness, burning or tingling in the legs. Baseline neuropathy. Patient also desires diabetic shoes.   There are no active problems to display for this patient.  Current Outpatient Prescriptions on File Prior to Visit  Medication Sig Dispense Refill  . Alcohol Swabs (ALCOHOL PREP) 70 % PADS     . amitriptyline (ELAVIL) 10 MG tablet Take 10 mg by mouth at bedtime.    Marland Kitchen amLODipine (NORVASC) 5 MG tablet Take 5 mg by mouth daily.    Marland Kitchen aspirin 81 MG tablet Take 81 mg by mouth daily.    . ASSURE COMFORT LANCETS 30G MISC     . b complex vitamins tablet Take 1 tablet by mouth daily.    . Blood Glucose Calibration (EMBRACE CONTROL) LOW SOLN     . Blood Glucose Monitoring Suppl (EMBRACE BLOOD GLUCOSE MONITOR) DEVI     . buPROPion (WELLBUTRIN XL) 150 MG 24 hr tablet     . cephALEXin (KEFLEX) 500 MG capsule     . cholecalciferol (VITAMIN D) 1000 UNITS tablet Take 1,000 Units by mouth daily.    . COMFORT EZ PEN NEEDLES 31G X 8 MM MISC     . EMBRACE BLOOD GLUCOSE TEST test strip     . folic acid (FOLVITE) 1 MG tablet Take 1 mg by mouth daily. Patient not sure of the mg/lc    . gabapentin (NEURONTIN) 300 MG capsule Take 1 capsule (300 mg total) by mouth 3 (three) times daily. 90 capsule 5  . gabapentin (NEURONTIN) 300 MG capsule Take 1 capsule (300 mg total) by mouth 3 (three) times daily. 90 capsule 0  . insulin aspart protamine- aspart (NOVOLOG MIX 70/30) (70-30) 100 UNIT/ML injection Inject into the skin.    Marland Kitchen ketoconazole (NIZORAL) 2 % shampoo     . Lancet Devices (ADJUSTABLE LANCING DEVICE) MISC     .  lisinopril (PRINIVIL,ZESTRIL) 2.5 MG tablet     . meloxicam (MOBIC) 15 MG tablet Take 1 tablet (15 mg total) by mouth daily. 30 tablet 2  . metFORMIN (GLUCOPHAGE) 1000 MG tablet Take 1,000 mg by mouth 2 (two) times daily with a meal.    . NOVOLOG MIX 70/30 FLEXPEN (70-30) 100 UNIT/ML FlexPen     . oxyCODONE-acetaminophen (PERCOCET/ROXICET) 5-325 MG per tablet     . pioglitazone (ACTOS) 15 MG tablet     . pravastatin (PRAVACHOL) 40 MG tablet Take 40 mg by mouth daily.    . ranitidine (ZANTAC) 150 MG tablet Take 150 mg by mouth 2 (two) times daily.    . sertraline (ZOLOFT) 100 MG tablet Take 100 mg by mouth daily.    Marland Kitchen topiramate (TOPAMAX) 50 MG tablet     . vitamin B-12 (CYANOCOBALAMIN) 1000 MCG tablet Take 1,000 mcg by mouth daily.     No current facility-administered medications on file prior to visit.    Allergies  Allergen Reactions  . Sulfa Antibiotics     No results found for this or any previous visit (from the past 2160 hour(s)).  Objective: General: Patient is awake, alert, and oriented x 3 and in no acute distress.  Integument:  Skin is warm, dry and supple bilateral. Nails are tender, long, thickened and  dystrophic with subungual debris, consistent with onychomycosis, 1-5 bilateral. No signs of infection. No open lesions or preulcerative lesions present bilateral. + soft core at left lateral 4th toe and medial 5th toe.  Remaining integument unremarkable.  Vasculature:  Dorsalis Pedis pulse 1/4 bilateral. Posterior Tibial pulse  1/4 bilateral.  Capillary fill time <5 sec 1-5 bilateral. Scant hair growth to the level of the digits. Temperature gradient within normal limits. + varicosities present bilateral. No edema present bilateral.   Neurology: The patient has diminished sensation measured with a 5.07/10g Semmes Weinstein Monofilament at all pedal sites bilateral . Vibratory sensation diminished bilateral with tuning fork. No Babinski sign present bilateral.    Musculoskeletal: Asymptomatic hammertoe pedal deformities noted bilateral. Muscular strength 5/5 in all lower extremity muscular groups bilateral without pain on range of motion . No tenderness with calf compression bilateral.  Assessment and Plan: Problem List Items Addressed This Visit    None    Visit Diagnoses    Encounter for diabetic foot exam ([place])    -  Primary   Dermatophytosis of nail       Soft corn       Hammer toe, unspecified laterality       Type 2 diabetes, controlled, with neuropathy ([place])       PVD (peripheral vascular disease) ([place])          -Examined patient. -Discussed and educated patient on diabetic foot care, especially with  regards to the vascular, neurological and musculoskeletal systems.  -Stressed the importance of good glycemic control and the detriment of not  controlling glucose levels in relation to the foot. -Mechanically debrided all nails 1-5 bilateral using sterile nail nipper and filed with dremel without incident  -Gave spacer for left 4th interspace  -[employer] diabetic shoe order form was completed; office to contact primary care for approval / certification;  Office to arrange shoe fitting and dispensing. -Answered all patient questions -Patient to return  in 3 months for at risk foot care -Patient advised to call the office if any problems or questions arise in the meantime.  Landis Martins, DPM

## 2016-05-24 DIAGNOSIS — K5732 Diverticulitis of large intestine without perforation or abscess without bleeding: Secondary | ICD-10-CM | POA: Diagnosis not present

## 2016-08-10 ENCOUNTER — Ambulatory Visit: Payer: Medicare HMO | Admitting: Sports Medicine

## 2016-08-17 DIAGNOSIS — E538 Deficiency of other specified B group vitamins: Secondary | ICD-10-CM | POA: Diagnosis not present

## 2016-08-17 DIAGNOSIS — Z87440 Personal history of urinary (tract) infections: Secondary | ICD-10-CM | POA: Diagnosis not present

## 2016-08-17 DIAGNOSIS — E785 Hyperlipidemia, unspecified: Secondary | ICD-10-CM | POA: Diagnosis not present

## 2016-08-17 DIAGNOSIS — K219 Gastro-esophageal reflux disease without esophagitis: Secondary | ICD-10-CM | POA: Diagnosis not present

## 2016-08-17 DIAGNOSIS — I10 Essential (primary) hypertension: Secondary | ICD-10-CM | POA: Diagnosis not present

## 2016-08-17 DIAGNOSIS — E1142 Type 2 diabetes mellitus with diabetic polyneuropathy: Secondary | ICD-10-CM | POA: Diagnosis not present

## 2016-08-17 DIAGNOSIS — Z79899 Other long term (current) drug therapy: Secondary | ICD-10-CM | POA: Diagnosis not present

## 2016-08-22 DIAGNOSIS — E538 Deficiency of other specified B group vitamins: Secondary | ICD-10-CM | POA: Diagnosis not present

## 2016-09-05 DIAGNOSIS — E113293 Type 2 diabetes mellitus with mild nonproliferative diabetic retinopathy without macular edema, bilateral: Secondary | ICD-10-CM | POA: Diagnosis not present

## 2016-09-05 DIAGNOSIS — H5202 Hypermetropia, left eye: Secondary | ICD-10-CM | POA: Diagnosis not present

## 2016-09-26 DIAGNOSIS — E538 Deficiency of other specified B group vitamins: Secondary | ICD-10-CM | POA: Diagnosis not present

## 2016-11-07 DIAGNOSIS — E538 Deficiency of other specified B group vitamins: Secondary | ICD-10-CM | POA: Diagnosis not present

## 2016-12-12 DIAGNOSIS — E538 Deficiency of other specified B group vitamins: Secondary | ICD-10-CM | POA: Diagnosis not present

## 2016-12-19 DIAGNOSIS — Z794 Long term (current) use of insulin: Secondary | ICD-10-CM | POA: Diagnosis not present

## 2016-12-19 DIAGNOSIS — E785 Hyperlipidemia, unspecified: Secondary | ICD-10-CM | POA: Diagnosis not present

## 2016-12-19 DIAGNOSIS — N3941 Urge incontinence: Secondary | ICD-10-CM | POA: Diagnosis not present

## 2016-12-19 DIAGNOSIS — I10 Essential (primary) hypertension: Secondary | ICD-10-CM | POA: Diagnosis not present

## 2016-12-19 DIAGNOSIS — E1142 Type 2 diabetes mellitus with diabetic polyneuropathy: Secondary | ICD-10-CM | POA: Diagnosis not present

## 2016-12-19 DIAGNOSIS — N390 Urinary tract infection, site not specified: Secondary | ICD-10-CM | POA: Diagnosis not present

## 2017-01-16 DIAGNOSIS — K219 Gastro-esophageal reflux disease without esophagitis: Secondary | ICD-10-CM | POA: Diagnosis not present

## 2017-01-16 DIAGNOSIS — E785 Hyperlipidemia, unspecified: Secondary | ICD-10-CM | POA: Diagnosis not present

## 2017-01-16 DIAGNOSIS — Z79899 Other long term (current) drug therapy: Secondary | ICD-10-CM | POA: Diagnosis not present

## 2017-01-16 DIAGNOSIS — I10 Essential (primary) hypertension: Secondary | ICD-10-CM | POA: Diagnosis not present

## 2017-01-16 DIAGNOSIS — E538 Deficiency of other specified B group vitamins: Secondary | ICD-10-CM | POA: Diagnosis not present

## 2017-01-16 DIAGNOSIS — E1142 Type 2 diabetes mellitus with diabetic polyneuropathy: Secondary | ICD-10-CM | POA: Diagnosis not present

## 2017-02-15 DIAGNOSIS — R7989 Other specified abnormal findings of blood chemistry: Secondary | ICD-10-CM | POA: Diagnosis not present

## 2017-02-15 DIAGNOSIS — E538 Deficiency of other specified B group vitamins: Secondary | ICD-10-CM | POA: Diagnosis not present

## 2017-02-28 DIAGNOSIS — N189 Chronic kidney disease, unspecified: Secondary | ICD-10-CM | POA: Diagnosis not present

## 2017-02-28 DIAGNOSIS — R944 Abnormal results of kidney function studies: Secondary | ICD-10-CM | POA: Diagnosis not present

## 2017-02-28 DIAGNOSIS — R7989 Other specified abnormal findings of blood chemistry: Secondary | ICD-10-CM | POA: Diagnosis not present

## 2017-03-05 DIAGNOSIS — Z1231 Encounter for screening mammogram for malignant neoplasm of breast: Secondary | ICD-10-CM | POA: Diagnosis not present

## 2017-03-20 DIAGNOSIS — R7989 Other specified abnormal findings of blood chemistry: Secondary | ICD-10-CM | POA: Diagnosis not present

## 2017-03-20 DIAGNOSIS — E538 Deficiency of other specified B group vitamins: Secondary | ICD-10-CM | POA: Diagnosis not present

## 2017-04-17 DIAGNOSIS — E538 Deficiency of other specified B group vitamins: Secondary | ICD-10-CM | POA: Diagnosis not present

## 2017-05-17 DIAGNOSIS — E538 Deficiency of other specified B group vitamins: Secondary | ICD-10-CM | POA: Diagnosis not present

## 2017-05-28 DIAGNOSIS — E1165 Type 2 diabetes mellitus with hyperglycemia: Secondary | ICD-10-CM | POA: Diagnosis not present

## 2017-05-28 DIAGNOSIS — N289 Disorder of kidney and ureter, unspecified: Secondary | ICD-10-CM | POA: Diagnosis not present

## 2017-05-28 DIAGNOSIS — M79642 Pain in left hand: Secondary | ICD-10-CM | POA: Diagnosis not present

## 2017-05-28 DIAGNOSIS — Z79899 Other long term (current) drug therapy: Secondary | ICD-10-CM | POA: Diagnosis not present

## 2017-05-28 DIAGNOSIS — M79641 Pain in right hand: Secondary | ICD-10-CM | POA: Diagnosis not present

## 2017-06-04 DIAGNOSIS — E113293 Type 2 diabetes mellitus with mild nonproliferative diabetic retinopathy without macular edema, bilateral: Secondary | ICD-10-CM | POA: Diagnosis not present

## 2017-06-04 DIAGNOSIS — H35351 Cystoid macular degeneration, right eye: Secondary | ICD-10-CM | POA: Diagnosis not present

## 2017-06-18 DIAGNOSIS — E538 Deficiency of other specified B group vitamins: Secondary | ICD-10-CM | POA: Diagnosis not present

## 2017-07-16 DIAGNOSIS — E538 Deficiency of other specified B group vitamins: Secondary | ICD-10-CM | POA: Diagnosis not present

## 2017-07-19 DIAGNOSIS — K5732 Diverticulitis of large intestine without perforation or abscess without bleeding: Secondary | ICD-10-CM | POA: Diagnosis not present

## 2017-07-19 DIAGNOSIS — I10 Essential (primary) hypertension: Secondary | ICD-10-CM | POA: Diagnosis not present

## 2017-07-19 DIAGNOSIS — M79641 Pain in right hand: Secondary | ICD-10-CM | POA: Diagnosis not present

## 2017-07-19 DIAGNOSIS — E1142 Type 2 diabetes mellitus with diabetic polyneuropathy: Secondary | ICD-10-CM | POA: Diagnosis not present

## 2017-07-19 DIAGNOSIS — E785 Hyperlipidemia, unspecified: Secondary | ICD-10-CM | POA: Diagnosis not present

## 2017-07-19 DIAGNOSIS — K219 Gastro-esophageal reflux disease without esophagitis: Secondary | ICD-10-CM | POA: Diagnosis not present

## 2017-07-19 DIAGNOSIS — M79642 Pain in left hand: Secondary | ICD-10-CM | POA: Diagnosis not present

## 2017-08-22 DIAGNOSIS — E538 Deficiency of other specified B group vitamins: Secondary | ICD-10-CM | POA: Diagnosis not present

## 2017-09-25 DIAGNOSIS — E538 Deficiency of other specified B group vitamins: Secondary | ICD-10-CM | POA: Diagnosis not present

## 2017-10-14 DIAGNOSIS — M25511 Pain in right shoulder: Secondary | ICD-10-CM | POA: Diagnosis not present

## 2017-10-14 DIAGNOSIS — M7541 Impingement syndrome of right shoulder: Secondary | ICD-10-CM | POA: Diagnosis not present

## 2017-10-28 DIAGNOSIS — E538 Deficiency of other specified B group vitamins: Secondary | ICD-10-CM | POA: Diagnosis not present

## 2017-11-28 DIAGNOSIS — E538 Deficiency of other specified B group vitamins: Secondary | ICD-10-CM | POA: Diagnosis not present

## 2017-12-31 DIAGNOSIS — S90862A Insect bite (nonvenomous), left foot, initial encounter: Secondary | ICD-10-CM | POA: Diagnosis not present

## 2017-12-31 DIAGNOSIS — W57XXXA Bitten or stung by nonvenomous insect and other nonvenomous arthropods, initial encounter: Secondary | ICD-10-CM | POA: Diagnosis not present

## 2017-12-31 DIAGNOSIS — E663 Overweight: Secondary | ICD-10-CM | POA: Diagnosis not present

## 2017-12-31 DIAGNOSIS — R350 Frequency of micturition: Secondary | ICD-10-CM | POA: Diagnosis not present

## 2017-12-31 DIAGNOSIS — Z6826 Body mass index (BMI) 26.0-26.9, adult: Secondary | ICD-10-CM | POA: Diagnosis not present

## 2018-01-01 DIAGNOSIS — E538 Deficiency of other specified B group vitamins: Secondary | ICD-10-CM | POA: Diagnosis not present

## 2018-01-02 DIAGNOSIS — M25511 Pain in right shoulder: Secondary | ICD-10-CM | POA: Diagnosis not present

## 2018-01-13 DIAGNOSIS — N3941 Urge incontinence: Secondary | ICD-10-CM | POA: Diagnosis not present

## 2018-01-13 DIAGNOSIS — K219 Gastro-esophageal reflux disease without esophagitis: Secondary | ICD-10-CM | POA: Diagnosis not present

## 2018-01-13 DIAGNOSIS — Z79899 Other long term (current) drug therapy: Secondary | ICD-10-CM | POA: Diagnosis not present

## 2018-01-13 DIAGNOSIS — E1142 Type 2 diabetes mellitus with diabetic polyneuropathy: Secondary | ICD-10-CM | POA: Diagnosis not present

## 2018-01-13 DIAGNOSIS — I10 Essential (primary) hypertension: Secondary | ICD-10-CM | POA: Diagnosis not present

## 2018-01-13 DIAGNOSIS — E785 Hyperlipidemia, unspecified: Secondary | ICD-10-CM | POA: Diagnosis not present

## 2018-01-14 DIAGNOSIS — B351 Tinea unguium: Secondary | ICD-10-CM | POA: Diagnosis not present

## 2018-01-14 DIAGNOSIS — B356 Tinea cruris: Secondary | ICD-10-CM | POA: Diagnosis not present

## 2018-01-14 DIAGNOSIS — L820 Inflamed seborrheic keratosis: Secondary | ICD-10-CM | POA: Diagnosis not present

## 2018-01-14 DIAGNOSIS — L821 Other seborrheic keratosis: Secondary | ICD-10-CM | POA: Diagnosis not present

## 2018-01-17 DIAGNOSIS — Z1239 Encounter for other screening for malignant neoplasm of breast: Secondary | ICD-10-CM | POA: Diagnosis not present

## 2018-01-17 DIAGNOSIS — Z6826 Body mass index (BMI) 26.0-26.9, adult: Secondary | ICD-10-CM | POA: Diagnosis not present

## 2018-01-17 DIAGNOSIS — Z7189 Other specified counseling: Secondary | ICD-10-CM | POA: Diagnosis not present

## 2018-01-17 DIAGNOSIS — Z0001 Encounter for general adult medical examination with abnormal findings: Secondary | ICD-10-CM | POA: Diagnosis not present

## 2018-01-17 DIAGNOSIS — Z1339 Encounter for screening examination for other mental health and behavioral disorders: Secondary | ICD-10-CM | POA: Diagnosis not present

## 2018-01-17 DIAGNOSIS — Z1331 Encounter for screening for depression: Secondary | ICD-10-CM | POA: Diagnosis not present

## 2018-01-17 DIAGNOSIS — E663 Overweight: Secondary | ICD-10-CM | POA: Diagnosis not present

## 2018-01-30 DIAGNOSIS — Z794 Long term (current) use of insulin: Secondary | ICD-10-CM | POA: Diagnosis not present

## 2018-01-30 DIAGNOSIS — Z6825 Body mass index (BMI) 25.0-25.9, adult: Secondary | ICD-10-CM | POA: Diagnosis not present

## 2018-01-30 DIAGNOSIS — B372 Candidiasis of skin and nail: Secondary | ICD-10-CM | POA: Diagnosis not present

## 2018-01-30 DIAGNOSIS — E663 Overweight: Secondary | ICD-10-CM | POA: Diagnosis not present

## 2018-01-30 DIAGNOSIS — E1142 Type 2 diabetes mellitus with diabetic polyneuropathy: Secondary | ICD-10-CM | POA: Diagnosis not present

## 2018-01-31 DIAGNOSIS — E538 Deficiency of other specified B group vitamins: Secondary | ICD-10-CM | POA: Diagnosis not present

## 2018-03-03 DIAGNOSIS — E538 Deficiency of other specified B group vitamins: Secondary | ICD-10-CM | POA: Diagnosis not present

## 2018-03-06 DIAGNOSIS — Z1231 Encounter for screening mammogram for malignant neoplasm of breast: Secondary | ICD-10-CM | POA: Diagnosis not present

## 2018-03-11 DIAGNOSIS — H524 Presbyopia: Secondary | ICD-10-CM | POA: Diagnosis not present

## 2018-03-11 DIAGNOSIS — H35351 Cystoid macular degeneration, right eye: Secondary | ICD-10-CM | POA: Diagnosis not present

## 2018-03-11 DIAGNOSIS — E113311 Type 2 diabetes mellitus with moderate nonproliferative diabetic retinopathy with macular edema, right eye: Secondary | ICD-10-CM | POA: Diagnosis not present

## 2018-04-01 DIAGNOSIS — E538 Deficiency of other specified B group vitamins: Secondary | ICD-10-CM | POA: Diagnosis not present

## 2018-04-14 DIAGNOSIS — H26492 Other secondary cataract, left eye: Secondary | ICD-10-CM | POA: Diagnosis not present

## 2018-04-14 DIAGNOSIS — H2512 Age-related nuclear cataract, left eye: Secondary | ICD-10-CM | POA: Diagnosis not present

## 2018-04-15 DIAGNOSIS — E113293 Type 2 diabetes mellitus with mild nonproliferative diabetic retinopathy without macular edema, bilateral: Secondary | ICD-10-CM | POA: Diagnosis not present

## 2018-04-15 DIAGNOSIS — H26493 Other secondary cataract, bilateral: Secondary | ICD-10-CM | POA: Diagnosis not present

## 2018-04-15 DIAGNOSIS — Z961 Presence of intraocular lens: Secondary | ICD-10-CM | POA: Diagnosis not present

## 2018-04-15 DIAGNOSIS — H02831 Dermatochalasis of right upper eyelid: Secondary | ICD-10-CM | POA: Diagnosis not present

## 2018-04-15 DIAGNOSIS — H18413 Arcus senilis, bilateral: Secondary | ICD-10-CM | POA: Diagnosis not present

## 2018-04-15 DIAGNOSIS — H26492 Other secondary cataract, left eye: Secondary | ICD-10-CM | POA: Diagnosis not present

## 2018-05-02 DIAGNOSIS — E538 Deficiency of other specified B group vitamins: Secondary | ICD-10-CM | POA: Diagnosis not present

## 2018-05-09 DIAGNOSIS — Z6826 Body mass index (BMI) 26.0-26.9, adult: Secondary | ICD-10-CM | POA: Diagnosis not present

## 2018-05-09 DIAGNOSIS — I10 Essential (primary) hypertension: Secondary | ICD-10-CM | POA: Diagnosis not present

## 2018-05-09 DIAGNOSIS — E785 Hyperlipidemia, unspecified: Secondary | ICD-10-CM | POA: Diagnosis not present

## 2018-05-09 DIAGNOSIS — Z79899 Other long term (current) drug therapy: Secondary | ICD-10-CM | POA: Diagnosis not present

## 2018-05-09 DIAGNOSIS — E1142 Type 2 diabetes mellitus with diabetic polyneuropathy: Secondary | ICD-10-CM | POA: Diagnosis not present

## 2018-05-09 DIAGNOSIS — K5732 Diverticulitis of large intestine without perforation or abscess without bleeding: Secondary | ICD-10-CM | POA: Diagnosis not present

## 2018-09-12 DIAGNOSIS — E785 Hyperlipidemia, unspecified: Secondary | ICD-10-CM | POA: Diagnosis not present

## 2018-09-12 DIAGNOSIS — M25511 Pain in right shoulder: Secondary | ICD-10-CM | POA: Diagnosis not present

## 2018-09-12 DIAGNOSIS — K5900 Constipation, unspecified: Secondary | ICD-10-CM | POA: Diagnosis not present

## 2018-09-12 DIAGNOSIS — K219 Gastro-esophageal reflux disease without esophagitis: Secondary | ICD-10-CM | POA: Diagnosis not present

## 2018-09-12 DIAGNOSIS — Z79899 Other long term (current) drug therapy: Secondary | ICD-10-CM | POA: Diagnosis not present

## 2018-09-12 DIAGNOSIS — E1142 Type 2 diabetes mellitus with diabetic polyneuropathy: Secondary | ICD-10-CM | POA: Diagnosis not present

## 2018-09-12 DIAGNOSIS — I10 Essential (primary) hypertension: Secondary | ICD-10-CM | POA: Diagnosis not present

## 2018-09-12 DIAGNOSIS — Z6827 Body mass index (BMI) 27.0-27.9, adult: Secondary | ICD-10-CM | POA: Diagnosis not present

## 2018-12-04 DIAGNOSIS — N390 Urinary tract infection, site not specified: Secondary | ICD-10-CM | POA: Diagnosis not present

## 2019-04-27 DIAGNOSIS — M1811 Unilateral primary osteoarthritis of first carpometacarpal joint, right hand: Secondary | ICD-10-CM | POA: Diagnosis not present

## 2019-04-27 DIAGNOSIS — E785 Hyperlipidemia, unspecified: Secondary | ICD-10-CM | POA: Diagnosis not present

## 2019-04-27 DIAGNOSIS — F325 Major depressive disorder, single episode, in full remission: Secondary | ICD-10-CM | POA: Diagnosis not present

## 2019-04-27 DIAGNOSIS — I10 Essential (primary) hypertension: Secondary | ICD-10-CM | POA: Diagnosis not present

## 2019-04-27 DIAGNOSIS — Z6826 Body mass index (BMI) 26.0-26.9, adult: Secondary | ICD-10-CM | POA: Diagnosis not present

## 2019-04-27 DIAGNOSIS — E1142 Type 2 diabetes mellitus with diabetic polyneuropathy: Secondary | ICD-10-CM | POA: Diagnosis not present

## 2019-04-27 DIAGNOSIS — E1165 Type 2 diabetes mellitus with hyperglycemia: Secondary | ICD-10-CM | POA: Diagnosis not present

## 2019-04-27 DIAGNOSIS — Z79899 Other long term (current) drug therapy: Secondary | ICD-10-CM | POA: Diagnosis not present

## 2019-07-27 DIAGNOSIS — E119 Type 2 diabetes mellitus without complications: Secondary | ICD-10-CM | POA: Diagnosis not present

## 2019-07-27 DIAGNOSIS — R1084 Generalized abdominal pain: Secondary | ICD-10-CM | POA: Diagnosis not present

## 2019-09-23 DIAGNOSIS — E113293 Type 2 diabetes mellitus with mild nonproliferative diabetic retinopathy without macular edema, bilateral: Secondary | ICD-10-CM | POA: Diagnosis not present

## 2019-09-23 DIAGNOSIS — Z961 Presence of intraocular lens: Secondary | ICD-10-CM | POA: Diagnosis not present

## 2019-09-23 DIAGNOSIS — H26491 Other secondary cataract, right eye: Secondary | ICD-10-CM | POA: Diagnosis not present

## 2019-09-23 DIAGNOSIS — H524 Presbyopia: Secondary | ICD-10-CM | POA: Diagnosis not present

## 2019-09-23 DIAGNOSIS — Z794 Long term (current) use of insulin: Secondary | ICD-10-CM | POA: Diagnosis not present

## 2019-09-29 DIAGNOSIS — E113299 Type 2 diabetes mellitus with mild nonproliferative diabetic retinopathy without macular edema, unspecified eye: Secondary | ICD-10-CM | POA: Diagnosis not present

## 2019-09-29 DIAGNOSIS — Z6827 Body mass index (BMI) 27.0-27.9, adult: Secondary | ICD-10-CM | POA: Diagnosis not present

## 2019-09-29 DIAGNOSIS — I10 Essential (primary) hypertension: Secondary | ICD-10-CM | POA: Diagnosis not present

## 2019-09-29 DIAGNOSIS — E1165 Type 2 diabetes mellitus with hyperglycemia: Secondary | ICD-10-CM | POA: Diagnosis not present

## 2019-09-29 DIAGNOSIS — E785 Hyperlipidemia, unspecified: Secondary | ICD-10-CM | POA: Diagnosis not present

## 2019-09-29 DIAGNOSIS — E1142 Type 2 diabetes mellitus with diabetic polyneuropathy: Secondary | ICD-10-CM | POA: Diagnosis not present

## 2019-09-29 DIAGNOSIS — Z79899 Other long term (current) drug therapy: Secondary | ICD-10-CM | POA: Diagnosis not present

## 2019-09-29 DIAGNOSIS — Z1331 Encounter for screening for depression: Secondary | ICD-10-CM | POA: Diagnosis not present

## 2019-09-29 DIAGNOSIS — Z7189 Other specified counseling: Secondary | ICD-10-CM | POA: Diagnosis not present

## 2019-10-06 DIAGNOSIS — R1030 Lower abdominal pain, unspecified: Secondary | ICD-10-CM | POA: Diagnosis not present

## 2019-10-06 DIAGNOSIS — R1032 Left lower quadrant pain: Secondary | ICD-10-CM | POA: Diagnosis not present

## 2019-10-07 DIAGNOSIS — Z1231 Encounter for screening mammogram for malignant neoplasm of breast: Secondary | ICD-10-CM | POA: Diagnosis not present

## 2020-01-18 DIAGNOSIS — R300 Dysuria: Secondary | ICD-10-CM | POA: Diagnosis not present

## 2020-01-18 DIAGNOSIS — R0602 Shortness of breath: Secondary | ICD-10-CM | POA: Diagnosis not present

## 2020-01-19 DIAGNOSIS — R0602 Shortness of breath: Secondary | ICD-10-CM | POA: Diagnosis not present

## 2020-01-20 DIAGNOSIS — R42 Dizziness and giddiness: Secondary | ICD-10-CM | POA: Diagnosis not present

## 2020-01-20 DIAGNOSIS — R110 Nausea: Secondary | ICD-10-CM | POA: Diagnosis not present

## 2020-01-20 DIAGNOSIS — R0602 Shortness of breath: Secondary | ICD-10-CM | POA: Diagnosis not present

## 2020-01-20 DIAGNOSIS — I700 Atherosclerosis of aorta: Secondary | ICD-10-CM | POA: Diagnosis not present

## 2020-01-20 DIAGNOSIS — Z20828 Contact with and (suspected) exposure to other viral communicable diseases: Secondary | ICD-10-CM | POA: Diagnosis not present

## 2020-01-20 DIAGNOSIS — R112 Nausea with vomiting, unspecified: Secondary | ICD-10-CM | POA: Diagnosis not present

## 2020-02-03 DIAGNOSIS — Z0001 Encounter for general adult medical examination with abnormal findings: Secondary | ICD-10-CM | POA: Diagnosis not present

## 2020-02-03 DIAGNOSIS — Z6828 Body mass index (BMI) 28.0-28.9, adult: Secondary | ICD-10-CM | POA: Diagnosis not present

## 2020-02-03 DIAGNOSIS — E785 Hyperlipidemia, unspecified: Secondary | ICD-10-CM | POA: Diagnosis not present

## 2020-02-03 DIAGNOSIS — E1142 Type 2 diabetes mellitus with diabetic polyneuropathy: Secondary | ICD-10-CM | POA: Diagnosis not present

## 2020-02-03 DIAGNOSIS — Z1339 Encounter for screening examination for other mental health and behavioral disorders: Secondary | ICD-10-CM | POA: Diagnosis not present

## 2020-02-03 DIAGNOSIS — F330 Major depressive disorder, recurrent, mild: Secondary | ICD-10-CM | POA: Diagnosis not present

## 2020-02-03 DIAGNOSIS — E113299 Type 2 diabetes mellitus with mild nonproliferative diabetic retinopathy without macular edema, unspecified eye: Secondary | ICD-10-CM | POA: Diagnosis not present

## 2020-02-03 DIAGNOSIS — Z1331 Encounter for screening for depression: Secondary | ICD-10-CM | POA: Diagnosis not present

## 2020-02-03 DIAGNOSIS — R0602 Shortness of breath: Secondary | ICD-10-CM | POA: Diagnosis not present

## 2020-02-11 DIAGNOSIS — R0602 Shortness of breath: Secondary | ICD-10-CM | POA: Diagnosis not present

## 2020-02-11 DIAGNOSIS — I10 Essential (primary) hypertension: Secondary | ICD-10-CM | POA: Diagnosis not present

## 2020-04-05 DIAGNOSIS — Z961 Presence of intraocular lens: Secondary | ICD-10-CM | POA: Diagnosis not present

## 2020-04-05 DIAGNOSIS — Z794 Long term (current) use of insulin: Secondary | ICD-10-CM | POA: Diagnosis not present

## 2020-04-05 DIAGNOSIS — E113213 Type 2 diabetes mellitus with mild nonproliferative diabetic retinopathy with macular edema, bilateral: Secondary | ICD-10-CM | POA: Diagnosis not present

## 2020-04-05 DIAGNOSIS — H26491 Other secondary cataract, right eye: Secondary | ICD-10-CM | POA: Diagnosis not present

## 2020-07-20 ENCOUNTER — Inpatient Hospital Stay (HOSPITAL_COMMUNITY)
Admission: AD | Admit: 2020-07-20 | Discharge: 2020-08-10 | DRG: 233 | Disposition: A | Discharge status: Skilled Nursing Facility | Payer: Medicare HMO | Source: Other Acute Inpatient Hospital | Attending: Cardiothoracic Surgery | Admitting: Cardiothoracic Surgery

## 2020-07-20 ENCOUNTER — Encounter (HOSPITAL_COMMUNITY): Payer: Self-pay | Admitting: Cardiology

## 2020-07-20 ENCOUNTER — Other Ambulatory Visit: Payer: Self-pay

## 2020-07-20 ENCOUNTER — Inpatient Hospital Stay (HOSPITAL_COMMUNITY): Payer: Medicare HMO

## 2020-07-20 DIAGNOSIS — I130 Hypertensive heart and chronic kidney disease with heart failure and stage 1 through stage 4 chronic kidney disease, or unspecified chronic kidney disease: Secondary | ICD-10-CM | POA: Diagnosis not present

## 2020-07-20 DIAGNOSIS — R1310 Dysphagia, unspecified: Secondary | ICD-10-CM | POA: Diagnosis present

## 2020-07-20 DIAGNOSIS — I509 Heart failure, unspecified: Secondary | ICD-10-CM | POA: Diagnosis present

## 2020-07-20 DIAGNOSIS — E119 Type 2 diabetes mellitus without complications: Secondary | ICD-10-CM | POA: Diagnosis not present

## 2020-07-20 DIAGNOSIS — Z951 Presence of aortocoronary bypass graft: Secondary | ICD-10-CM

## 2020-07-20 DIAGNOSIS — I4891 Unspecified atrial fibrillation: Secondary | ICD-10-CM | POA: Diagnosis not present

## 2020-07-20 DIAGNOSIS — T8111XA Postprocedural  cardiogenic shock, initial encounter: Secondary | ICD-10-CM | POA: Diagnosis present

## 2020-07-20 DIAGNOSIS — J90 Pleural effusion, not elsewhere classified: Secondary | ICD-10-CM | POA: Diagnosis not present

## 2020-07-20 DIAGNOSIS — D6959 Other secondary thrombocytopenia: Secondary | ICD-10-CM | POA: Diagnosis present

## 2020-07-20 DIAGNOSIS — Z8249 Family history of ischemic heart disease and other diseases of the circulatory system: Secondary | ICD-10-CM

## 2020-07-20 DIAGNOSIS — I4892 Unspecified atrial flutter: Secondary | ICD-10-CM | POA: Diagnosis not present

## 2020-07-20 DIAGNOSIS — I712 Thoracic aortic aneurysm, without rupture: Secondary | ICD-10-CM | POA: Diagnosis not present

## 2020-07-20 DIAGNOSIS — I517 Cardiomegaly: Secondary | ICD-10-CM | POA: Diagnosis not present

## 2020-07-20 DIAGNOSIS — Z882 Allergy status to sulfonamides status: Secondary | ICD-10-CM

## 2020-07-20 DIAGNOSIS — E870 Hyperosmolality and hypernatremia: Secondary | ICD-10-CM | POA: Diagnosis present

## 2020-07-20 DIAGNOSIS — I214 Non-ST elevation (NSTEMI) myocardial infarction: Secondary | ICD-10-CM | POA: Diagnosis not present

## 2020-07-20 DIAGNOSIS — I313 Pericardial effusion (noninflammatory): Secondary | ICD-10-CM | POA: Diagnosis not present

## 2020-07-20 DIAGNOSIS — D62 Acute posthemorrhagic anemia: Secondary | ICD-10-CM | POA: Diagnosis not present

## 2020-07-20 DIAGNOSIS — I480 Paroxysmal atrial fibrillation: Secondary | ICD-10-CM | POA: Diagnosis present

## 2020-07-20 DIAGNOSIS — R092 Respiratory arrest: Secondary | ICD-10-CM

## 2020-07-20 DIAGNOSIS — E876 Hypokalemia: Secondary | ICD-10-CM | POA: Diagnosis not present

## 2020-07-20 DIAGNOSIS — J95821 Acute postprocedural respiratory failure: Secondary | ICD-10-CM | POA: Diagnosis not present

## 2020-07-20 DIAGNOSIS — I2510 Atherosclerotic heart disease of native coronary artery without angina pectoris: Secondary | ICD-10-CM | POA: Diagnosis present

## 2020-07-20 DIAGNOSIS — Z9889 Other specified postprocedural states: Secondary | ICD-10-CM

## 2020-07-20 DIAGNOSIS — I129 Hypertensive chronic kidney disease with stage 1 through stage 4 chronic kidney disease, or unspecified chronic kidney disease: Secondary | ICD-10-CM | POA: Diagnosis not present

## 2020-07-20 DIAGNOSIS — I10 Essential (primary) hypertension: Secondary | ICD-10-CM | POA: Diagnosis not present

## 2020-07-20 DIAGNOSIS — R918 Other nonspecific abnormal finding of lung field: Secondary | ICD-10-CM | POA: Diagnosis not present

## 2020-07-20 DIAGNOSIS — Z791 Long term (current) use of non-steroidal anti-inflammatories (NSAID): Secondary | ICD-10-CM

## 2020-07-20 DIAGNOSIS — I25110 Atherosclerotic heart disease of native coronary artery with unstable angina pectoris: Secondary | ICD-10-CM | POA: Diagnosis not present

## 2020-07-20 DIAGNOSIS — Z7982 Long term (current) use of aspirin: Secondary | ICD-10-CM

## 2020-07-20 DIAGNOSIS — I083 Combined rheumatic disorders of mitral, aortic and tricuspid valves: Secondary | ICD-10-CM | POA: Diagnosis not present

## 2020-07-20 DIAGNOSIS — N189 Chronic kidney disease, unspecified: Secondary | ICD-10-CM | POA: Diagnosis not present

## 2020-07-20 DIAGNOSIS — E785 Hyperlipidemia, unspecified: Secondary | ICD-10-CM | POA: Diagnosis not present

## 2020-07-20 DIAGNOSIS — J189 Pneumonia, unspecified organism: Secondary | ICD-10-CM | POA: Diagnosis not present

## 2020-07-20 DIAGNOSIS — I442 Atrioventricular block, complete: Secondary | ICD-10-CM | POA: Diagnosis not present

## 2020-07-20 DIAGNOSIS — I672 Cerebral atherosclerosis: Secondary | ICD-10-CM | POA: Diagnosis not present

## 2020-07-20 DIAGNOSIS — Z7401 Bed confinement status: Secondary | ICD-10-CM | POA: Diagnosis not present

## 2020-07-20 DIAGNOSIS — Z95818 Presence of other cardiac implants and grafts: Secondary | ICD-10-CM

## 2020-07-20 DIAGNOSIS — Z23 Encounter for immunization: Secondary | ICD-10-CM

## 2020-07-20 DIAGNOSIS — Z781 Physical restraint status: Secondary | ICD-10-CM

## 2020-07-20 DIAGNOSIS — Z20822 Contact with and (suspected) exposure to covid-19: Secondary | ICD-10-CM | POA: Diagnosis present

## 2020-07-20 DIAGNOSIS — Z6831 Body mass index (BMI) 31.0-31.9, adult: Secondary | ICD-10-CM

## 2020-07-20 DIAGNOSIS — I9581 Postprocedural hypotension: Secondary | ICD-10-CM | POA: Diagnosis present

## 2020-07-20 DIAGNOSIS — R001 Bradycardia, unspecified: Secondary | ICD-10-CM | POA: Diagnosis present

## 2020-07-20 DIAGNOSIS — Z794 Long term (current) use of insulin: Secondary | ICD-10-CM

## 2020-07-20 DIAGNOSIS — I314 Cardiac tamponade: Secondary | ICD-10-CM | POA: Diagnosis not present

## 2020-07-20 DIAGNOSIS — I700 Atherosclerosis of aorta: Secondary | ICD-10-CM | POA: Diagnosis not present

## 2020-07-20 DIAGNOSIS — I255 Ischemic cardiomyopathy: Secondary | ICD-10-CM | POA: Diagnosis present

## 2020-07-20 DIAGNOSIS — N170 Acute kidney failure with tubular necrosis: Secondary | ICD-10-CM | POA: Diagnosis not present

## 2020-07-20 DIAGNOSIS — I5021 Acute systolic (congestive) heart failure: Secondary | ICD-10-CM | POA: Diagnosis not present

## 2020-07-20 DIAGNOSIS — E1122 Type 2 diabetes mellitus with diabetic chronic kidney disease: Secondary | ICD-10-CM | POA: Diagnosis present

## 2020-07-20 DIAGNOSIS — D696 Thrombocytopenia, unspecified: Secondary | ICD-10-CM | POA: Diagnosis not present

## 2020-07-20 DIAGNOSIS — F05 Delirium due to known physiological condition: Secondary | ICD-10-CM | POA: Diagnosis not present

## 2020-07-20 DIAGNOSIS — F29 Unspecified psychosis not due to a substance or known physiological condition: Secondary | ICD-10-CM | POA: Diagnosis not present

## 2020-07-20 DIAGNOSIS — I959 Hypotension, unspecified: Secondary | ICD-10-CM | POA: Diagnosis not present

## 2020-07-20 DIAGNOSIS — R2689 Other abnormalities of gait and mobility: Secondary | ICD-10-CM | POA: Diagnosis not present

## 2020-07-20 DIAGNOSIS — J9690 Respiratory failure, unspecified, unspecified whether with hypoxia or hypercapnia: Secondary | ICD-10-CM | POA: Diagnosis not present

## 2020-07-20 DIAGNOSIS — J9811 Atelectasis: Secondary | ICD-10-CM | POA: Diagnosis not present

## 2020-07-20 DIAGNOSIS — Z950 Presence of cardiac pacemaker: Secondary | ICD-10-CM | POA: Diagnosis not present

## 2020-07-20 DIAGNOSIS — Z79899 Other long term (current) drug therapy: Secondary | ICD-10-CM

## 2020-07-20 DIAGNOSIS — Z7984 Long term (current) use of oral hypoglycemic drugs: Secondary | ICD-10-CM

## 2020-07-20 DIAGNOSIS — R531 Weakness: Secondary | ICD-10-CM | POA: Diagnosis not present

## 2020-07-20 DIAGNOSIS — R570 Cardiogenic shock: Secondary | ICD-10-CM | POA: Diagnosis not present

## 2020-07-20 DIAGNOSIS — R9431 Abnormal electrocardiogram [ECG] [EKG]: Secondary | ICD-10-CM | POA: Diagnosis not present

## 2020-07-20 DIAGNOSIS — R0602 Shortness of breath: Secondary | ICD-10-CM | POA: Diagnosis not present

## 2020-07-20 DIAGNOSIS — N1832 Chronic kidney disease, stage 3b: Secondary | ICD-10-CM | POA: Diagnosis present

## 2020-07-20 DIAGNOSIS — Z01810 Encounter for preprocedural cardiovascular examination: Secondary | ICD-10-CM | POA: Diagnosis not present

## 2020-07-20 DIAGNOSIS — M6281 Muscle weakness (generalized): Secondary | ICD-10-CM | POA: Diagnosis not present

## 2020-07-20 DIAGNOSIS — I5030 Unspecified diastolic (congestive) heart failure: Secondary | ICD-10-CM | POA: Diagnosis not present

## 2020-07-20 DIAGNOSIS — M2550 Pain in unspecified joint: Secondary | ICD-10-CM | POA: Diagnosis not present

## 2020-07-20 DIAGNOSIS — J690 Pneumonitis due to inhalation of food and vomit: Secondary | ICD-10-CM | POA: Diagnosis not present

## 2020-07-20 DIAGNOSIS — Z4682 Encounter for fitting and adjustment of non-vascular catheter: Secondary | ICD-10-CM | POA: Diagnosis not present

## 2020-07-20 HISTORY — DX: Depression, unspecified: F32.A

## 2020-07-20 HISTORY — DX: Essential (primary) hypertension: I10

## 2020-07-20 LAB — GLUCOSE, CAPILLARY
Glucose-Capillary: 158 mg/dL — ABNORMAL HIGH (ref 70–99)
Glucose-Capillary: 154 mg/dL — ABNORMAL HIGH (ref 70–99)
Glucose-Capillary: 165 mg/dL — ABNORMAL HIGH (ref 70–99)

## 2020-07-20 LAB — HEMOGLOBIN A1C
Hgb A1c MFr Bld: 8.8 % — ABNORMAL HIGH (ref 4.8–5.6)
Mean Plasma Glucose: 205.86 mg/dL

## 2020-07-20 LAB — TROPONIN I (HIGH SENSITIVITY)
Troponin I (High Sensitivity): 3198 ng/L (ref <18)
Troponin I (High Sensitivity): 3282 ng/L (ref <18)

## 2020-07-20 LAB — SARS CORONAVIRUS 2 (TAT 6-24 HRS): SARS Coronavirus 2: NEGATIVE

## 2020-07-20 LAB — HEPARIN LEVEL (UNFRACTIONATED): Heparin Unfractionated: 0.25 IU/mL — ABNORMAL LOW (ref 0.30–0.70)

## 2020-07-20 LAB — MAGNESIUM: Magnesium: 1.9 mg/dL (ref 1.7–2.4)

## 2020-07-20 MED ORDER — INSULIN ASPART 100 UNIT/ML ~~LOC~~ SOLN
0.0000 [IU] | Freq: Three times a day (TID) | SUBCUTANEOUS | Status: DC
  Administered 2020-07-20 – 2020-07-21 (×2): 2 [IU] via SUBCUTANEOUS

## 2020-07-20 MED ORDER — ASPIRIN 81 MG PO CHEW
81.0000 mg | CHEWABLE_TABLET | ORAL | Status: AC
  Administered 2020-07-21: 81 mg via ORAL
  Filled 2020-07-20: qty 1

## 2020-07-20 MED ORDER — PERFLUTREN LIPID MICROSPHERE
1.0000 mL | INTRAVENOUS | Status: AC | PRN
  Administered 2020-07-20: 5 mL via INTRAVENOUS
  Filled 2020-07-20: qty 10

## 2020-07-20 MED ORDER — ASPIRIN 300 MG RE SUPP
300.0000 mg | RECTAL | Status: AC
  Filled 2020-07-20: qty 1

## 2020-07-20 MED ORDER — BUPROPION HCL ER (XL) 150 MG PO TB24
150.0000 mg | ORAL_TABLET | Freq: Every day | ORAL | Status: DC
  Administered 2020-07-22: 150 mg via ORAL
  Filled 2020-07-20: qty 1

## 2020-07-20 MED ORDER — ACETAMINOPHEN 325 MG PO TABS: 650.0000 mg | ORAL_TABLET | ORAL | Status: DC | PRN

## 2020-07-20 MED ORDER — SODIUM CHLORIDE 0.9 % WEIGHT BASED INFUSION
1.0000 mL/kg/h | INTRAVENOUS | Status: DC
  Administered 2020-07-21: 1 mL/kg/h via INTRAVENOUS

## 2020-07-20 MED ORDER — ATORVASTATIN CALCIUM 80 MG PO TABS
80.0000 mg | ORAL_TABLET | Freq: Every day | ORAL | Status: DC
  Administered 2020-07-20: 80 mg via ORAL
  Filled 2020-07-20: qty 1

## 2020-07-20 MED ORDER — ASPIRIN 81 MG PO CHEW
324.0000 mg | CHEWABLE_TABLET | ORAL | Status: AC
  Administered 2020-07-20: 324 mg via ORAL
  Filled 2020-07-20: qty 4

## 2020-07-20 MED ORDER — HEPARIN BOLUS VIA INFUSION
1000.0000 [IU] | Freq: Once | INTRAVENOUS | Status: AC
  Administered 2020-07-20: 1000 [IU] via INTRAVENOUS
  Filled 2020-07-20: qty 1000

## 2020-07-20 MED ORDER — METOPROLOL TARTRATE 12.5 MG HALF TABLET
12.5000 mg | ORAL_TABLET | Freq: Two times a day (BID) | ORAL | Status: DC
  Administered 2020-07-20: 12.5 mg via ORAL
  Filled 2020-07-20: qty 1

## 2020-07-20 MED ORDER — SODIUM CHLORIDE 0.9% FLUSH: 3.0000 mL | INTRAVENOUS | Status: DC | PRN

## 2020-07-20 MED ORDER — SODIUM CHLORIDE 0.9 % IV SOLN: 250.0000 mL | INTRAVENOUS | Status: DC | PRN

## 2020-07-20 MED ORDER — SODIUM CHLORIDE 0.9% FLUSH
3.0000 mL | Freq: Two times a day (BID) | INTRAVENOUS | Status: DC
  Administered 2020-07-20: 3 mL via INTRAVENOUS

## 2020-07-20 MED ORDER — PANTOPRAZOLE SODIUM 40 MG PO TBEC
40.0000 mg | DELAYED_RELEASE_TABLET | Freq: Every day | ORAL | Status: DC
  Administered 2020-07-21: 40 mg via ORAL
  Filled 2020-07-20: qty 1

## 2020-07-20 MED ORDER — ASPIRIN EC 81 MG PO TBEC: 81.0000 mg | DELAYED_RELEASE_TABLET | Freq: Every day | ORAL | Status: DC

## 2020-07-20 MED ORDER — NITROGLYCERIN 2 % TD OINT
0.5000 [in_us] | TOPICAL_OINTMENT | Freq: Four times a day (QID) | TRANSDERMAL | Status: DC
  Administered 2020-07-20 – 2020-07-21 (×3): 0.5 [in_us] via TOPICAL
  Filled 2020-07-20: qty 30

## 2020-07-20 MED ORDER — ONDANSETRON HCL 4 MG/2ML IJ SOLN: 4.0000 mg | Freq: Four times a day (QID) | INTRAMUSCULAR | Status: DC | PRN

## 2020-07-20 MED ORDER — HEPARIN (PORCINE) 25000 UT/250ML-% IV SOLN
1000.0000 [IU]/h | INTRAVENOUS | Status: DC
  Administered 2020-07-20: 700 [IU]/h via INTRAVENOUS
  Filled 2020-07-20: qty 250

## 2020-07-20 MED ORDER — NITROGLYCERIN 0.4 MG SL SUBL: 0.4000 mg | SUBLINGUAL_TABLET | SUBLINGUAL | Status: DC | PRN

## 2020-07-20 MED ORDER — SODIUM CHLORIDE 0.9 % IV SOLN: INTRAVENOUS | Status: DC

## 2020-07-20 NOTE — H&P (Signed)
Kristin [name] is an 80 y.o. female.   Chief Complaint: Chest pain radiating to left arm associated with shortness of breath HPI: Patient is 80 year old female with past medical history significant for hypertension, hyperlipidemia, insulin requiring diabetes mellitus, morbid obesity, strong family history of coronary artery disease, father died of massive MI at the age of 43, was transferred from [facility] ED for further evaluation and treatment.  Patient complained of retrosternal and left-sided chest discomfort radiating to left arm which woke her up around 3 AM did not seek any medical attention woke up again in the morning had chest discomfort radiating to left arm associated with shortness of breath called PMDs office and was advised to go to ED EKG done in the ED showed normal sinus rhythm with left bundle branch block patient was noted to have mildly elevated troponin I of 1.92 patient was started on IV heparin received aspirin in the ED with relief of chest pain presently denies any chest pain.  Patient states she has been short of breath for last few months with minimal exertion associated with feeling tired and fatigued.  Denies any invasive cardiac work-up in the past.  States used to take aspirin but has stopped taking it.  Past Medical History:  Diagnosis Date  . Diabetes mellitus without complication ([place])   . Night sweats     Past Surgical History:  Procedure Laterality Date  . EYE SURGERY    . GALLBLADDER SURGERY    . toenail surgery     both big toenails    No family history on file. Social History:  reports that she has never smoked. She has never used smokeless tobacco. She reports that she does not drink alcohol and does not use drugs.  Allergies:  Allergies  Allergen Reactions  . Sulfa Antibiotics     Medications Prior to Admission  Medication Sig Dispense Refill  . Alcohol Swabs (ALCOHOL PREP) 70 % PADS     . amitriptyline (ELAVIL) 10 MG tablet Take 10 mg  by mouth at bedtime.    Marland Kitchen amLODipine (NORVASC) 5 MG tablet Take 5 mg by mouth daily.    Marland Kitchen aspirin 81 MG tablet Take 81 mg by mouth daily.    . ASSURE COMFORT LANCETS 30G MISC     . b complex vitamins tablet Take 1 tablet by mouth daily.    . Blood Glucose Calibration (EMBRACE CONTROL) LOW SOLN     . Blood Glucose Monitoring Suppl (EMBRACE BLOOD GLUCOSE MONITOR) DEVI     . buPROPion (WELLBUTRIN XL) 150 MG 24 hr tablet     . cephALEXin (KEFLEX) 500 MG capsule     . cholecalciferol (VITAMIN D) 1000 UNITS tablet Take 1,000 Units by mouth daily.    . COMFORT EZ PEN NEEDLES 31G X 8 MM MISC     . EMBRACE BLOOD GLUCOSE TEST test strip     . folic acid (FOLVITE) 1 MG tablet Take 1 mg by mouth daily. Patient not sure of the mg/lc    . gabapentin (NEURONTIN) 300 MG capsule Take 1 capsule (300 mg total) by mouth 3 (three) times daily. 90 capsule 5  . gabapentin (NEURONTIN) 300 MG capsule Take 1 capsule (300 mg total) by mouth 3 (three) times daily. 90 capsule 0  . glimepiride (AMARYL) 1 MG tablet Take 1 mg by mouth every morning.    . insulin aspart protamine- aspart (NOVOLOG MIX 70/30) (70-30) 100 UNIT/ML injection Inject into the skin.    Marland Kitchen  ketoconazole (NIZORAL) 2 % shampoo     . Lancet Devices (ADJUSTABLE LANCING DEVICE) MISC     . LEVEMIR FLEXTOUCH 100 UNIT/ML FlexPen Inject 42 Units into the skin daily.    Marland Kitchen lisinopril (PRINIVIL,ZESTRIL) 2.5 MG tablet     . meloxicam (MOBIC) 15 MG tablet Take 1 tablet (15 mg total) by mouth daily. 30 tablet 2  . metFORMIN (GLUCOPHAGE) 1000 MG tablet Take 1,000 mg by mouth 2 (two) times daily with a meal.    . NOVOLOG MIX 70/30 FLEXPEN (70-30) 100 UNIT/ML FlexPen     . oxyCODONE-acetaminophen (PERCOCET/ROXICET) 5-325 MG per tablet     . pioglitazone (ACTOS) 15 MG tablet     . pioglitazone (ACTOS) 30 MG tablet Take 30 mg by mouth daily.    . pravastatin (PRAVACHOL) 40 MG tablet Take 40 mg by mouth daily.    . ranitidine (ZANTAC) 150 MG tablet Take 150 mg by mouth  2 (two) times daily.    . rosuvastatin (CRESTOR) 40 MG tablet Take 40 mg by mouth daily.    . sertraline (ZOLOFT) 100 MG tablet Take 100 mg by mouth daily.    Marland Kitchen topiramate (TOPAMAX) 50 MG tablet     . trimethoprim (TRIMPEX) 100 MG tablet Take 100 mg by mouth daily.    . vitamin B-12 (CYANOCOBALAMIN) 1000 MCG tablet Take 1,000 mcg by mouth daily.      No results found for this or any previous visit (from the past 48 hour(s)). No results found.  Review of Systems  Constitutional: Negative for diaphoresis.  HENT: Negative for sore throat.   Eyes: Negative for discharge.  Respiratory: Positive for shortness of breath.   Cardiovascular: Positive for chest pain. Negative for palpitations and leg swelling.  Gastrointestinal: Negative for abdominal distention and abdominal pain.  Genitourinary: Negative for difficulty urinating.  Neurological: Negative for dizziness.    Blood pressure 132/77, pulse 98, temperature 98.2 F (36.8 C), temperature source Oral, resp. rate 18, height '5\' 6"'$  (1.676 m), weight 78.1 kg, SpO2 100 %. Physical Exam Constitutional:      Appearance: Normal appearance.  HENT:     Head: Normocephalic and atraumatic.  Eyes:     Extraocular Movements: Extraocular movements intact.     Conjunctiva/sclera: Conjunctivae normal.     Pupils: Pupils are equal, round, and reactive to light.  Neck:     Vascular: No carotid bruit.  Cardiovascular:     Rate and Rhythm: Normal rate and regular rhythm.     Comments: Soft systolic murmur and paradoxical splitting of second heart sound noted Pulmonary:     Effort: Pulmonary effort is normal.     Breath sounds: Normal breath sounds. No rales.  Abdominal:     General: There is distension.     Palpations: Abdomen is soft.     Tenderness: There is no abdominal tenderness.  Musculoskeletal:     Cervical back: Normal range of motion and neck supple.  Skin:    General: Skin is warm and dry.  Neurological:     General: No focal  deficit present.     Mental Status: She is alert and oriented to person, place, and time.      Assessment/Plan Acute non-STEMI Hypertension Diabetes mellitus Hyperlipidemia Strong family history of coronary artery disease Chronic kidney disease stage III Morbid obesity Plan As per orders Discussed with patient and her son regarding left cardiac catheterization possible PTCA stenting its risk and benefits i.e. death MI stroke need for emergency CABG  local vascular complications worsening renal function, staging the procedure etc. and consents for PCI. Charolette Forward, MD 07/20/2020, 2:57 PM

## 2020-07-20 NOTE — Progress Notes (Addendum)
ANTICOAGULATION CONSULT NOTE - Initial Consult  Pharmacy Consult for heparin Indication: chest pain/ACS  Allergies  Allergen Reactions  . Sulfa Antibiotics     Patient Measurements: Height: '5\' 6"'$  (167.6 cm) Weight: 78.1 kg (172 lb 3.2 oz) (scale b) IBW/kg (Calculated) : 59.3 Heparin Dosing Weight: 75 kg  Vital Signs: Temp: 98.2 F (36.8 C) (03/23 1420) Temp Source: Oral (03/23 1420) BP: 132/77 (03/23 1420) Pulse Rate: 98 (03/23 1420)  Labs: No results for input(s): HGB, HCT, PLT, APTT, LABPROT, INR, HEPARINUNFRC, HEPRLOWMOCWT, CREATININE, CKTOTAL, CKMB, TROPONINIHS in the last 72 hours.  CrCl cannot be calculated (No successful lab value found.).   Medical History: Past Medical History:  Diagnosis Date  . Diabetes mellitus without complication ([place])   . Night sweats     Medications:  Medications Prior to Admission  Medication Sig Dispense Refill Last Dose  . Alcohol Swabs (ALCOHOL PREP) 70 % PADS      . amitriptyline (ELAVIL) 10 MG tablet Take 10 mg by mouth at bedtime.     Marland Kitchen amLODipine (NORVASC) 5 MG tablet Take 5 mg by mouth daily.     Marland Kitchen aspirin 81 MG tablet Take 81 mg by mouth daily.     . ASSURE COMFORT LANCETS 30G MISC      . b complex vitamins tablet Take 1 tablet by mouth daily.     . Blood Glucose Calibration (EMBRACE CONTROL) LOW SOLN      . Blood Glucose Monitoring Suppl (EMBRACE BLOOD GLUCOSE MONITOR) DEVI      . buPROPion (WELLBUTRIN XL) 150 MG 24 hr tablet      . cephALEXin (KEFLEX) 500 MG capsule      . cholecalciferol (VITAMIN D) 1000 UNITS tablet Take 1,000 Units by mouth daily.     . COMFORT EZ PEN NEEDLES 31G X 8 MM MISC      . EMBRACE BLOOD GLUCOSE TEST test strip      . folic acid (FOLVITE) 1 MG tablet Take 1 mg by mouth daily. Patient not sure of the mg/lc     . gabapentin (NEURONTIN) 300 MG capsule Take 1 capsule (300 mg total) by mouth 3 (three) times daily. 90 capsule 5   . gabapentin (NEURONTIN) 300 MG capsule Take 1 capsule (300 mg  total) by mouth 3 (three) times daily. 90 capsule 0   . glimepiride (AMARYL) 1 MG tablet Take 1 mg by mouth every morning.     . insulin aspart protamine- aspart (NOVOLOG MIX 70/30) (70-30) 100 UNIT/ML injection Inject into the skin.     Marland Kitchen ketoconazole (NIZORAL) 2 % shampoo      . Lancet Devices (ADJUSTABLE LANCING DEVICE) MISC      . LEVEMIR FLEXTOUCH 100 UNIT/ML FlexPen Inject 42 Units into the skin daily.     Marland Kitchen lisinopril (PRINIVIL,ZESTRIL) 2.5 MG tablet      . meloxicam (MOBIC) 15 MG tablet Take 1 tablet (15 mg total) by mouth daily. 30 tablet 2   . metFORMIN (GLUCOPHAGE) 1000 MG tablet Take 1,000 mg by mouth 2 (two) times daily with a meal.     . NOVOLOG MIX 70/30 FLEXPEN (70-30) 100 UNIT/ML FlexPen      . oxyCODONE-acetaminophen (PERCOCET/ROXICET) 5-325 MG per tablet      . pioglitazone (ACTOS) 15 MG tablet      . pioglitazone (ACTOS) 30 MG tablet Take 30 mg by mouth daily.     . pravastatin (PRAVACHOL) 40 MG tablet Take 40 mg by mouth daily.     Marland Kitchen  ranitidine (ZANTAC) 150 MG tablet Take 150 mg by mouth 2 (two) times daily.     . rosuvastatin (CRESTOR) 40 MG tablet Take 40 mg by mouth daily.     . sertraline (ZOLOFT) 100 MG tablet Take 100 mg by mouth daily.     Marland Kitchen topiramate (TOPAMAX) 50 MG tablet      . trimethoprim (TRIMPEX) 100 MG tablet Take 100 mg by mouth daily.     . vitamin B-12 (CYANOCOBALAMIN) 1000 MCG tablet Take 1,000 mcg by mouth daily.       Assessment: 68 yof transferred from [facility] for NSTEMI - is on heparin drip currently. Plan is for cath. Pharmacy consulted to manage IV heparin. No AC PTA noted.  [facility] labs: WBC 6.8 Hgb 12.5/ Hct 37.7 Plt 199  SCr 1.6, BUN 26 Trop 1.92 Baseline aPTT 24.6  Goal of Therapy:  Heparin level 0.3-0.7 units/ml Monitor platelets by anticoagulation protocol: Yes   Plan:  [facility] gave a heparin 3000 unit IV bolus at 11:25 then started an infusion of 700 units/hr at 11:27 8 hr heparin level Daily  heparin level and CBC Monitor for s/sx of bleeding  Thank you for involving pharmacy in this patient's care.  Renold Genta, PharmD, BCPS Clinical Pharmacist Clinical phone for 07/20/2020 until 10p is [phone] 07/20/2020 3:15 PM  **Pharmacist phone directory can be found on [email or web address] listed under [place]**   Addendum: Heparin level is SUBtherapeutic at 0.25 on 700 units/hr. No bleeding or problems noted with infusion per RN.  Heparin 1000 unit IV bolus then increase infusion to 850 units/hr 8hr heparin level  Renold Genta, PharmD, BCPS 8:39 PM

## 2020-07-20 NOTE — Progress Notes (Signed)
  Echocardiogram 2D Echocardiogram has been performed with [facility]. Dr. Charolette Forward notified by secure chat in Epic and by text message at 6:20 PM that echo was complete.  Kristin [name] 07/20/2020, 6:22 PM

## 2020-07-21 ENCOUNTER — Inpatient Hospital Stay (HOSPITAL_COMMUNITY): Payer: Medicare HMO

## 2020-07-21 ENCOUNTER — Encounter (HOSPITAL_COMMUNITY)
Admission: AD | Disposition: A | Discharge status: Skilled Nursing Facility | Payer: Self-pay | Source: Other Acute Inpatient Hospital | Attending: Cardiothoracic Surgery

## 2020-07-21 ENCOUNTER — Inpatient Hospital Stay (HOSPITAL_COMMUNITY): Payer: Medicare HMO | Admitting: Certified Registered Nurse Anesthetist

## 2020-07-21 ENCOUNTER — Encounter (HOSPITAL_COMMUNITY): Payer: Self-pay | Admitting: Cardiology

## 2020-07-21 DIAGNOSIS — Z01810 Encounter for preprocedural cardiovascular examination: Secondary | ICD-10-CM

## 2020-07-21 DIAGNOSIS — I313 Pericardial effusion (noninflammatory): Secondary | ICD-10-CM

## 2020-07-21 DIAGNOSIS — I25110 Atherosclerotic heart disease of native coronary artery with unstable angina pectoris: Secondary | ICD-10-CM

## 2020-07-21 HISTORY — PX: TEE WITHOUT CARDIOVERSION: SHX5443

## 2020-07-21 HISTORY — PX: IABP INSERTION: CATH118242

## 2020-07-21 HISTORY — PX: LEFT HEART CATH AND CORONARY ANGIOGRAPHY: CATH118249

## 2020-07-21 HISTORY — PX: CORONARY ARTERY BYPASS GRAFT: SHX141

## 2020-07-21 HISTORY — PX: ENDOVEIN HARVEST OF GREATER SAPHENOUS VEIN: SHX5059

## 2020-07-21 LAB — POCT I-STAT, CHEM 8
BUN: 24 mg/dL — ABNORMAL HIGH (ref 8–23)
BUN: 24 mg/dL — ABNORMAL HIGH (ref 8–23)
BUN: 25 mg/dL — ABNORMAL HIGH (ref 8–23)
BUN: 24 mg/dL — ABNORMAL HIGH (ref 8–23)
BUN: 23 mg/dL (ref 8–23)
Calcium, Ion: 1.3 mmol/L (ref 1.15–1.40)
Calcium, Ion: 1.08 mmol/L — ABNORMAL LOW (ref 1.15–1.40)
Calcium, Ion: 1.32 mmol/L (ref 1.15–1.40)
Calcium, Ion: 1.03 mmol/L — ABNORMAL LOW (ref 1.15–1.40)
Calcium, Ion: 0.99 mmol/L — ABNORMAL LOW (ref 1.15–1.40)
Chloride: 113 mmol/L — ABNORMAL HIGH (ref 98–111)
Chloride: 110 mmol/L (ref 98–111)
Chloride: 112 mmol/L — ABNORMAL HIGH (ref 98–111)
Chloride: 112 mmol/L — ABNORMAL HIGH (ref 98–111)
Chloride: 110 mmol/L (ref 98–111)
Creatinine, Ser: 1.3 mg/dL — ABNORMAL HIGH (ref 0.44–1.00)
Creatinine, Ser: 1.1 mg/dL — ABNORMAL HIGH (ref 0.44–1.00)
Creatinine, Ser: 1.3 mg/dL — ABNORMAL HIGH (ref 0.44–1.00)
Creatinine, Ser: 1.1 mg/dL — ABNORMAL HIGH (ref 0.44–1.00)
Creatinine, Ser: 1.1 mg/dL — ABNORMAL HIGH (ref 0.44–1.00)
Glucose, Bld: 112 mg/dL — ABNORMAL HIGH (ref 70–99)
Glucose, Bld: 112 mg/dL — ABNORMAL HIGH (ref 70–99)
Glucose, Bld: 91 mg/dL (ref 70–99)
Glucose, Bld: 110 mg/dL — ABNORMAL HIGH (ref 70–99)
Glucose, Bld: 119 mg/dL — ABNORMAL HIGH (ref 70–99)
HCT: 31 % — ABNORMAL LOW (ref 36.0–46.0)
HCT: 23 % — ABNORMAL LOW (ref 36.0–46.0)
HCT: 31 % — ABNORMAL LOW (ref 36.0–46.0)
HCT: 22 % — ABNORMAL LOW (ref 36.0–46.0)
HCT: 21 % — ABNORMAL LOW (ref 36.0–46.0)
Hemoglobin: 10.5 g/dL — ABNORMAL LOW (ref 12.0–15.0)
Hemoglobin: 10.5 g/dL — ABNORMAL LOW (ref 12.0–15.0)
Hemoglobin: 7.8 g/dL — ABNORMAL LOW (ref 12.0–15.0)
Hemoglobin: 7.5 g/dL — ABNORMAL LOW (ref 12.0–15.0)
Hemoglobin: 7.1 g/dL — ABNORMAL LOW (ref 12.0–15.0)
Potassium: 3.6 mmol/L (ref 3.5–5.1)
Potassium: 3.6 mmol/L (ref 3.5–5.1)
Potassium: 5.6 mmol/L — ABNORMAL HIGH (ref 3.5–5.1)
Potassium: 5.5 mmol/L — ABNORMAL HIGH (ref 3.5–5.1)
Potassium: 4.5 mmol/L (ref 3.5–5.1)
Sodium: 144 mmol/L (ref 135–145)
Sodium: 140 mmol/L (ref 135–145)
Sodium: 143 mmol/L (ref 135–145)
Sodium: 141 mmol/L (ref 135–145)
Sodium: 144 mmol/L (ref 135–145)
TCO2: 21 mmol/L — ABNORMAL LOW (ref 22–32)
TCO2: 23 mmol/L (ref 22–32)
TCO2: 24 mmol/L (ref 22–32)
TCO2: 24 mmol/L (ref 22–32)
TCO2: 23 mmol/L (ref 22–32)

## 2020-07-21 LAB — POCT I-STAT EG7
Acid-base deficit: 5 mmol/L — ABNORMAL HIGH (ref 0.0–2.0)
Acid-base deficit: 5 mmol/L — ABNORMAL HIGH (ref 0.0–2.0)
Bicarbonate: 23.1 mmol/L (ref 20.0–28.0)
Bicarbonate: 19.8 mmol/L — ABNORMAL LOW (ref 20.0–28.0)
Calcium, Ion: 1.1 mmol/L — ABNORMAL LOW (ref 1.15–1.40)
Calcium, Ion: 1.33 mmol/L (ref 1.15–1.40)
HCT: 26 % — ABNORMAL LOW (ref 36.0–46.0)
HCT: 19 % — ABNORMAL LOW (ref 36.0–46.0)
Hemoglobin: 8.8 g/dL — ABNORMAL LOW (ref 12.0–15.0)
Hemoglobin: 6.5 g/dL — CL (ref 12.0–15.0)
O2 Saturation: 88 %
O2 Saturation: 62 %
Potassium: 3.8 mmol/L (ref 3.5–5.1)
Potassium: 4.3 mmol/L (ref 3.5–5.1)
Sodium: 145 mmol/L (ref 135–145)
Sodium: 145 mmol/L (ref 135–145)
TCO2: 25 mmol/L (ref 22–32)
TCO2: 21 mmol/L — ABNORMAL LOW (ref 22–32)
pCO2, Ven: 58.2 mmHg (ref 44.0–60.0)
pCO2, Ven: 33.2 mmHg — ABNORMAL LOW (ref 44.0–60.0)
pH, Ven: 7.207 — ABNORMAL LOW (ref 7.250–7.430)
pH, Ven: 7.383 (ref 7.250–7.430)
pO2, Ven: 67 mmHg — ABNORMAL HIGH (ref 32.0–45.0)
pO2, Ven: 32 mmHg (ref 32.0–45.0)

## 2020-07-21 LAB — POCT I-STAT 7, (LYTES, BLD GAS, ICA,H+H)
Acid-Base Excess: 0 mmol/L (ref 0.0–2.0)
Acid-base deficit: 5 mmol/L — ABNORMAL HIGH (ref 0.0–2.0)
Acid-base deficit: 4 mmol/L — ABNORMAL HIGH (ref 0.0–2.0)
Acid-base deficit: 2 mmol/L (ref 0.0–2.0)
Acid-base deficit: 4 mmol/L — ABNORMAL HIGH (ref 0.0–2.0)
Acid-base deficit: 10 mmol/L — ABNORMAL HIGH (ref 0.0–2.0)
Bicarbonate: 19.9 mmol/L — ABNORMAL LOW (ref 20.0–28.0)
Bicarbonate: 23.3 mmol/L (ref 20.0–28.0)
Bicarbonate: 23.4 mmol/L (ref 20.0–28.0)
Bicarbonate: 21 mmol/L (ref 20.0–28.0)
Bicarbonate: 20.5 mmol/L (ref 20.0–28.0)
Bicarbonate: 16.9 mmol/L — ABNORMAL LOW (ref 20.0–28.0)
Calcium, Ion: 1.28 mmol/L (ref 1.15–1.40)
Calcium, Ion: 1.05 mmol/L — ABNORMAL LOW (ref 1.15–1.40)
Calcium, Ion: 1.05 mmol/L — ABNORMAL LOW (ref 1.15–1.40)
Calcium, Ion: 0.96 mmol/L — ABNORMAL LOW (ref 1.15–1.40)
Calcium, Ion: 1.16 mmol/L (ref 1.15–1.40)
Calcium, Ion: 1.12 mmol/L — ABNORMAL LOW (ref 1.15–1.40)
HCT: 31 % — ABNORMAL LOW (ref 36.0–46.0)
HCT: 26 % — ABNORMAL LOW (ref 36.0–46.0)
HCT: 22 % — ABNORMAL LOW (ref 36.0–46.0)
HCT: 25 % — ABNORMAL LOW (ref 36.0–46.0)
HCT: 19 % — ABNORMAL LOW (ref 36.0–46.0)
HCT: 26 % — ABNORMAL LOW (ref 36.0–46.0)
Hemoglobin: 10.5 g/dL — ABNORMAL LOW (ref 12.0–15.0)
Hemoglobin: 8.8 g/dL — ABNORMAL LOW (ref 12.0–15.0)
Hemoglobin: 7.5 g/dL — ABNORMAL LOW (ref 12.0–15.0)
Hemoglobin: 8.5 g/dL — ABNORMAL LOW (ref 12.0–15.0)
Hemoglobin: 6.5 g/dL — CL (ref 12.0–15.0)
Hemoglobin: 8.8 g/dL — ABNORMAL LOW (ref 12.0–15.0)
O2 Saturation: 100 %
O2 Saturation: 100 %
O2 Saturation: 100 %
O2 Saturation: 100 %
O2 Saturation: 100 %
O2 Saturation: 98 %
Patient temperature: 35.2
Patient temperature: 35.8
Potassium: 3.6 mmol/L (ref 3.5–5.1)
Potassium: 4 mmol/L (ref 3.5–5.1)
Potassium: 5 mmol/L (ref 3.5–5.1)
Potassium: 4.5 mmol/L (ref 3.5–5.1)
Potassium: 4.1 mmol/L (ref 3.5–5.1)
Potassium: 4 mmol/L (ref 3.5–5.1)
Sodium: 145 mmol/L (ref 135–145)
Sodium: 145 mmol/L (ref 135–145)
Sodium: 142 mmol/L (ref 135–145)
Sodium: 144 mmol/L (ref 135–145)
Sodium: 145 mmol/L (ref 135–145)
Sodium: 145 mmol/L (ref 135–145)
TCO2: 21 mmol/L — ABNORMAL LOW (ref 22–32)
TCO2: 25 mmol/L (ref 22–32)
TCO2: 24 mmol/L (ref 22–32)
TCO2: 22 mmol/L (ref 22–32)
TCO2: 21 mmol/L — ABNORMAL LOW (ref 22–32)
TCO2: 18 mmol/L — ABNORMAL LOW (ref 22–32)
pCO2 arterial: 34.8 mmHg (ref 32.0–48.0)
pCO2 arterial: 56 mmHg — ABNORMAL HIGH (ref 32.0–48.0)
pCO2 arterial: 33.8 mmHg (ref 32.0–48.0)
pCO2 arterial: 30.1 mmHg — ABNORMAL LOW (ref 32.0–48.0)
pCO2 arterial: 30 mmHg — ABNORMAL LOW (ref 32.0–48.0)
pCO2 arterial: 40.9 mmHg (ref 32.0–48.0)
pH, Arterial: 7.364 (ref 7.350–7.450)
pH, Arterial: 7.228 — ABNORMAL LOW (ref 7.350–7.450)
pH, Arterial: 7.449 (ref 7.350–7.450)
pH, Arterial: 7.453 — ABNORMAL HIGH (ref 7.350–7.450)
pH, Arterial: 7.434 (ref 7.350–7.450)
pH, Arterial: 7.217 — ABNORMAL LOW (ref 7.350–7.450)
pO2, Arterial: 212 mmHg — ABNORMAL HIGH (ref 83.0–108.0)
pO2, Arterial: 459 mmHg — ABNORMAL HIGH (ref 83.0–108.0)
pO2, Arterial: 386 mmHg — ABNORMAL HIGH (ref 83.0–108.0)
pO2, Arterial: 448 mmHg — ABNORMAL HIGH (ref 83.0–108.0)
pO2, Arterial: 159 mmHg — ABNORMAL HIGH (ref 83.0–108.0)
pO2, Arterial: 130 mmHg — ABNORMAL HIGH (ref 83.0–108.0)

## 2020-07-21 LAB — BASIC METABOLIC PANEL
Anion gap: 6 (ref 5–15)
Anion gap: 11 (ref 5–15)
BUN: 26 mg/dL — ABNORMAL HIGH (ref 8–23)
BUN: 20 mg/dL (ref 8–23)
CO2: 21 mmol/L — ABNORMAL LOW (ref 22–32)
CO2: 17 mmol/L — ABNORMAL LOW (ref 22–32)
Calcium: 8.8 mg/dL — ABNORMAL LOW (ref 8.9–10.3)
Calcium: 7.6 mg/dL — ABNORMAL LOW (ref 8.9–10.3)
Chloride: 112 mmol/L — ABNORMAL HIGH (ref 98–111)
Chloride: 116 mmol/L — ABNORMAL HIGH (ref 98–111)
Creatinine, Ser: 1.57 mg/dL — ABNORMAL HIGH (ref 0.44–1.00)
Creatinine, Ser: 1.43 mg/dL — ABNORMAL HIGH (ref 0.44–1.00)
GFR, Estimated: 34 mL/min — ABNORMAL LOW (ref >60)
GFR, Estimated: 38 mL/min — ABNORMAL LOW (ref >60)
Glucose, Bld: 199 mg/dL — ABNORMAL HIGH (ref 70–99)
Glucose, Bld: 103 mg/dL — ABNORMAL HIGH (ref 70–99)
Potassium: 3.9 mmol/L (ref 3.5–5.1)
Potassium: 4 mmol/L (ref 3.5–5.1)
Sodium: 139 mmol/L (ref 135–145)
Sodium: 144 mmol/L (ref 135–145)

## 2020-07-21 LAB — CBC
HCT: 33.6 % — ABNORMAL LOW (ref 36.0–46.0)
HCT: 21 % — ABNORMAL LOW (ref 36.0–46.0)
HCT: 29.2 % — ABNORMAL LOW (ref 36.0–46.0)
Hemoglobin: 11.2 g/dL — ABNORMAL LOW (ref 12.0–15.0)
Hemoglobin: 7.1 g/dL — ABNORMAL LOW (ref 12.0–15.0)
Hemoglobin: 9.6 g/dL — ABNORMAL LOW (ref 12.0–15.0)
MCH: 33.2 pg (ref 26.0–34.0)
MCH: 34.1 pg — ABNORMAL HIGH (ref 26.0–34.0)
MCH: 32.5 pg (ref 26.0–34.0)
MCHC: 33.3 g/dL (ref 30.0–36.0)
MCHC: 33.8 g/dL (ref 30.0–36.0)
MCHC: 32.9 g/dL (ref 30.0–36.0)
MCV: 99.7 fL (ref 80.0–100.0)
MCV: 101 fL — ABNORMAL HIGH (ref 80.0–100.0)
MCV: 99 fL (ref 80.0–100.0)
Platelets: 181 10*3/uL (ref 150–400)
Platelets: 116 10*3/uL — ABNORMAL LOW (ref 150–400)
Platelets: 134 10*3/uL — ABNORMAL LOW (ref 150–400)
RBC: 3.37 MIL/uL — ABNORMAL LOW (ref 3.87–5.11)
RBC: 2.08 MIL/uL — ABNORMAL LOW (ref 3.87–5.11)
RBC: 2.95 MIL/uL — ABNORMAL LOW (ref 3.87–5.11)
RDW: 13.2 % (ref 11.5–15.5)
RDW: 13.2 % (ref 11.5–15.5)
RDW: 14.9 % (ref 11.5–15.5)
WBC: 6.9 10*3/uL (ref 4.0–10.5)
WBC: 6 10*3/uL (ref 4.0–10.5)
WBC: 10.5 10*3/uL (ref 4.0–10.5)
nRBC: 0 % (ref 0.0–0.2)
nRBC: 0 % (ref 0.0–0.2)
nRBC: 0 % (ref 0.0–0.2)

## 2020-07-21 LAB — ECHOCARDIOGRAM COMPLETE
AR max vel: 1.95 cm2
AV Area VTI: 2.06 cm2
AV Area mean vel: 2.03 cm2
AV Mean grad: 4 mmHg
AV Peak grad: 6.9 mmHg
Ao pk vel: 1.31 m/s
Area-P 1/2: 3.12 cm2
Height: 66 in
MV VTI: 1.83 cm2
S' Lateral: 3.35 cm
Weight: 2755.2 oz

## 2020-07-21 LAB — COOXEMETRY PANEL
Carboxyhemoglobin: 0.7 % (ref 0.5–1.5)
Methemoglobin: 1.1 % (ref 0.0–1.5)
O2 Saturation: 56.2 %
Total hemoglobin: 9.1 g/dL — ABNORMAL LOW (ref 12.0–16.0)

## 2020-07-21 LAB — SURGICAL PCR SCREEN
MRSA, PCR: NEGATIVE
Staphylococcus aureus: NEGATIVE

## 2020-07-21 LAB — PREPARE RBC (CROSSMATCH)

## 2020-07-21 LAB — LIPID PANEL
Cholesterol: 170 mg/dL (ref 0–200)
HDL: 51 mg/dL (ref >40)
LDL Cholesterol: 92 mg/dL (ref 0–99)
Total CHOL/HDL Ratio: 3.3 RATIO
Triglycerides: 134 mg/dL (ref <150)
VLDL: 27 mg/dL (ref 0–40)

## 2020-07-21 LAB — GLUCOSE, CAPILLARY
Glucose-Capillary: 178 mg/dL — ABNORMAL HIGH (ref 70–99)
Glucose-Capillary: 92 mg/dL (ref 70–99)
Glucose-Capillary: 152 mg/dL — ABNORMAL HIGH (ref 70–99)
Glucose-Capillary: 142 mg/dL — ABNORMAL HIGH (ref 70–99)
Glucose-Capillary: 125 mg/dL — ABNORMAL HIGH (ref 70–99)
Glucose-Capillary: 131 mg/dL — ABNORMAL HIGH (ref 70–99)

## 2020-07-21 LAB — ECHO INTRAOPERATIVE TEE
Height: 66 in
S' Lateral: 3.13 cm
Weight: 2754.87 oz

## 2020-07-21 LAB — HEMOGLOBIN AND HEMATOCRIT, BLOOD
HCT: 23.9 % — ABNORMAL LOW (ref 36.0–46.0)
Hemoglobin: 7.8 g/dL — ABNORMAL LOW (ref 12.0–15.0)

## 2020-07-21 LAB — PROTIME-INR
INR: 1.6 — ABNORMAL HIGH (ref 0.8–1.2)
Prothrombin Time: 18.2 seconds — ABNORMAL HIGH (ref 11.4–15.2)

## 2020-07-21 LAB — PLATELET COUNT: Platelets: 115 10*3/uL — ABNORMAL LOW (ref 150–400)

## 2020-07-21 LAB — ABO/RH: ABO/RH(D): O POS

## 2020-07-21 LAB — APTT: aPTT: 40 seconds — ABNORMAL HIGH (ref 24–36)

## 2020-07-21 LAB — HEPARIN LEVEL (UNFRACTIONATED): Heparin Unfractionated: 0.26 IU/mL — ABNORMAL LOW (ref 0.30–0.70)

## 2020-07-21 SURGERY — CORONARY ARTERY BYPASS GRAFTING (CABG)
Anesthesia: General | Site: Leg Upper

## 2020-07-21 SURGERY — LEFT HEART CATH AND CORONARY ANGIOGRAPHY
Anesthesia: LOCAL

## 2020-07-21 MED ORDER — METOPROLOL TARTRATE 25 MG/10 ML ORAL SUSPENSION: 12.5000 mg | Freq: Two times a day (BID) | ORAL | Status: DC

## 2020-07-21 MED ORDER — OXYCODONE HCL 5 MG PO TABS: 5.0000 mg | ORAL_TABLET | ORAL | Status: DC | PRN

## 2020-07-21 MED ORDER — HEPARIN SODIUM (PORCINE) 1000 UNIT/ML IJ SOLN
INTRAMUSCULAR | Status: DC | PRN
  Administered 2020-07-21: 3000 [IU] via INTRAVENOUS

## 2020-07-21 MED ORDER — EPINEPHRINE HCL 5 MG/250ML IV SOLN IN NS: 0.0000 ug/min | INTRAVENOUS | Status: DC

## 2020-07-21 MED ORDER — SODIUM CHLORIDE 0.9% FLUSH
3.0000 mL | Freq: Two times a day (BID) | INTRAVENOUS | Status: DC
  Administered 2020-07-22 – 2020-07-27 (×9): 3 mL via INTRAVENOUS

## 2020-07-21 MED ORDER — SODIUM CHLORIDE 0.9% IV SOLUTION: Freq: Once | INTRAVENOUS | Status: DC

## 2020-07-21 MED ORDER — PHENYLEPHRINE 40 MCG/ML (10ML) SYRINGE FOR IV PUSH (FOR BLOOD PRESSURE SUPPORT)
PREFILLED_SYRINGE | INTRAVENOUS | Status: DC | PRN
  Administered 2020-07-21: 80 ug via INTRAVENOUS
  Administered 2020-07-21 (×2): 40 ug via INTRAVENOUS

## 2020-07-21 MED ORDER — DEXMEDETOMIDINE HCL IN NACL 400 MCG/100ML IV SOLN
0.1000 ug/kg/h | INTRAVENOUS | Status: AC
  Administered 2020-07-21: .5 ug/kg/h via INTRAVENOUS
  Filled 2020-07-21: qty 100

## 2020-07-21 MED ORDER — SODIUM CHLORIDE 0.9% IV SOLUTION: Freq: Once | INTRAVENOUS | Status: AC

## 2020-07-21 MED ORDER — VASOPRESSIN 20 UNITS/100 ML INFUSION FOR SHOCK
0.0000 [IU]/min | INTRAVENOUS | Status: DC
Start: 2020-07-21 — End: 2020-07-21

## 2020-07-21 MED ORDER — FENTANYL CITRATE (PF) 100 MCG/2ML IJ SOLN
INTRAMUSCULAR | Status: AC
  Filled 2020-07-21: qty 2

## 2020-07-21 MED ORDER — BISACODYL 5 MG PO TBEC
10.0000 mg | DELAYED_RELEASE_TABLET | Freq: Every day | ORAL | Status: DC
  Administered 2020-07-23 – 2020-08-07 (×9): 10 mg via ORAL
  Filled 2020-07-21 (×12): qty 2

## 2020-07-21 MED ORDER — HEPARIN (PORCINE) IN NACL 1000-0.9 UT/500ML-% IV SOLN
INTRAVENOUS | Status: DC | PRN
  Administered 2020-07-21 (×2): 500 mL

## 2020-07-21 MED ORDER — CHLORHEXIDINE GLUCONATE 0.12 % MT SOLN: 15.0000 mL | OROMUCOSAL | Status: AC

## 2020-07-21 MED ORDER — INSULIN REGULAR(HUMAN) IN NACL 100-0.9 UT/100ML-% IV SOLN
INTRAVENOUS | Status: AC
  Administered 2020-07-21: 1.4 [IU]/h via INTRAVENOUS
  Filled 2020-07-21: qty 100

## 2020-07-21 MED ORDER — LACTATED RINGERS IV SOLN: 500.0000 mL | Freq: Once | INTRAVENOUS | Status: DC | PRN

## 2020-07-21 MED ORDER — NITROGLYCERIN IN D5W 200-5 MCG/ML-% IV SOLN
2.0000 ug/min | INTRAVENOUS | Status: AC
  Administered 2020-07-21: 5 ug/min via INTRAVENOUS
  Filled 2020-07-21: qty 250

## 2020-07-21 MED ORDER — VANCOMYCIN HCL 1000 MG IV SOLR
INTRAVENOUS | Status: AC
  Filled 2020-07-21: qty 3000

## 2020-07-21 MED ORDER — LACTATED RINGERS IV SOLN: INTRAVENOUS | Status: DC

## 2020-07-21 MED ORDER — HEPARIN SODIUM (PORCINE) 1000 UNIT/ML IJ SOLN
INTRAMUSCULAR | Status: AC
  Filled 2020-07-21: qty 1

## 2020-07-21 MED ORDER — METOPROLOL TARTRATE 12.5 MG HALF TABLET: 12.5000 mg | ORAL_TABLET | Freq: Two times a day (BID) | ORAL | Status: DC

## 2020-07-21 MED ORDER — FENTANYL CITRATE (PF) 100 MCG/2ML IJ SOLN
INTRAMUSCULAR | Status: DC | PRN
  Administered 2020-07-21: 25 ug via INTRAVENOUS

## 2020-07-21 MED ORDER — LACTATED RINGERS IV SOLN: INTRAVENOUS | Status: DC | PRN

## 2020-07-21 MED ORDER — SODIUM CHLORIDE 0.9% FLUSH: 3.0000 mL | INTRAVENOUS | Status: DC | PRN

## 2020-07-21 MED ORDER — MIDAZOLAM HCL (PF) 10 MG/2ML IJ SOLN
INTRAMUSCULAR | Status: AC
  Filled 2020-07-21: qty 2

## 2020-07-21 MED ORDER — SODIUM CHLORIDE 0.9% FLUSH: 3.0000 mL | Freq: Two times a day (BID) | INTRAVENOUS | Status: DC

## 2020-07-21 MED ORDER — MUPIROCIN 2 % EX OINT
TOPICAL_OINTMENT | CUTANEOUS | Status: AC
  Filled 2020-07-21: qty 22

## 2020-07-21 MED ORDER — LIDOCAINE 2% (20 MG/ML) 5 ML SYRINGE
INTRAMUSCULAR | Status: DC | PRN
  Administered 2020-07-21: 40 mg via INTRAVENOUS

## 2020-07-21 MED ORDER — SODIUM BICARBONATE 8.4 % IV SOLN
INTRAVENOUS | Status: DC
  Filled 2020-07-21: qty 850

## 2020-07-21 MED ORDER — POTASSIUM CHLORIDE 10 MEQ/50ML IV SOLN: 10.0000 meq | INTRAVENOUS | Status: AC

## 2020-07-21 MED ORDER — TRAMADOL HCL 50 MG PO TABS: 50.0000 mg | ORAL_TABLET | ORAL | Status: DC | PRN

## 2020-07-21 MED ORDER — ACETAMINOPHEN 160 MG/5ML PO SOLN: 650.0000 mg | Freq: Once | ORAL | Status: AC

## 2020-07-21 MED ORDER — BISACODYL 10 MG RE SUPP: 10.0000 mg | Freq: Every day | RECTAL | Status: DC

## 2020-07-21 MED ORDER — ACETAMINOPHEN 650 MG RE SUPP
650.0000 mg | Freq: Once | RECTAL | Status: AC
  Administered 2020-07-21: 650 mg via RECTAL

## 2020-07-21 MED ORDER — ALBUMIN HUMAN 5 % IV SOLN
250.0000 mL | INTRAVENOUS | Status: AC | PRN
  Administered 2020-07-21 – 2020-07-22 (×4): 12.5 g via INTRAVENOUS
  Filled 2020-07-21 (×2): qty 250

## 2020-07-21 MED ORDER — VANCOMYCIN HCL IN DEXTROSE 1-5 GM/200ML-% IV SOLN
1000.0000 mg | Freq: Once | INTRAVENOUS | Status: AC
  Administered 2020-07-21: 1000 mg via INTRAVENOUS
  Filled 2020-07-21: qty 200

## 2020-07-21 MED ORDER — ROCURONIUM BROMIDE 10 MG/ML (PF) SYRINGE
PREFILLED_SYRINGE | INTRAVENOUS | Status: AC
  Filled 2020-07-21: qty 10

## 2020-07-21 MED ORDER — SODIUM CHLORIDE 0.9 % IV SOLN
1.5000 g | INTRAVENOUS | Status: AC
  Administered 2020-07-21: 1.5 g via INTRAVENOUS
  Filled 2020-07-21: qty 1.5

## 2020-07-21 MED ORDER — CALCIUM CHLORIDE 10 % IV SOLN: 1.0000 g | Freq: Once | INTRAVENOUS | Status: AC

## 2020-07-21 MED ORDER — 0.9 % SODIUM CHLORIDE (POUR BTL) OPTIME
TOPICAL | Status: DC | PRN
  Administered 2020-07-21: 5000 mL

## 2020-07-21 MED ORDER — PHENYLEPHRINE HCL-NACL 20-0.9 MG/250ML-% IV SOLN
0.0000 ug/min | INTRAVENOUS | Status: DC
  Administered 2020-07-21: 25 ug/min via INTRAVENOUS
  Administered 2020-07-21: 100 ug/min via INTRAVENOUS
  Filled 2020-07-21 (×2): qty 250

## 2020-07-21 MED ORDER — DOCUSATE SODIUM 100 MG PO CAPS: 200.0000 mg | ORAL_CAPSULE | Freq: Every day | ORAL | Status: DC

## 2020-07-21 MED ORDER — CHLORHEXIDINE GLUCONATE CLOTH 2 % EX PADS: 6.0000 | MEDICATED_PAD | Freq: Once | CUTANEOUS | Status: DC

## 2020-07-21 MED ORDER — METOPROLOL TARTRATE 5 MG/5ML IV SOLN: 2.5000 mg | INTRAVENOUS | Status: DC | PRN

## 2020-07-21 MED ORDER — SODIUM CHLORIDE 0.9 % IV SOLN: 250.0000 mL | INTRAVENOUS | Status: DC | PRN

## 2020-07-21 MED ORDER — PLASMA-LYTE 148 IV SOLN
INTRAVENOUS | Status: DC | PRN
  Administered 2020-07-21: 500 mL

## 2020-07-21 MED ORDER — FAMOTIDINE IN NACL 20-0.9 MG/50ML-% IV SOLN
20.0000 mg | Freq: Two times a day (BID) | INTRAVENOUS | Status: AC
  Administered 2020-07-21 (×2): 20 mg via INTRAVENOUS
  Filled 2020-07-21: qty 50

## 2020-07-21 MED ORDER — PROTAMINE SULFATE 10 MG/ML IV SOLN
INTRAVENOUS | Status: AC
  Filled 2020-07-21: qty 25

## 2020-07-21 MED ORDER — MAGNESIUM SULFATE 4 GM/100ML IV SOLN
4.0000 g | Freq: Once | INTRAVENOUS | Status: AC
  Administered 2020-07-21: 4 g via INTRAVENOUS
  Filled 2020-07-21: qty 100

## 2020-07-21 MED ORDER — LIDOCAINE 2% (20 MG/ML) 5 ML SYRINGE
INTRAMUSCULAR | Status: AC
  Filled 2020-07-21: qty 5

## 2020-07-21 MED ORDER — POTASSIUM CHLORIDE 2 MEQ/ML IV SOLN
80.0000 meq | INTRAVENOUS | Status: DC
  Filled 2020-07-21: qty 40

## 2020-07-21 MED ORDER — ROCURONIUM BROMIDE 10 MG/ML (PF) SYRINGE
PREFILLED_SYRINGE | INTRAVENOUS | Status: DC | PRN
  Administered 2020-07-21: 40 mg via INTRAVENOUS
  Administered 2020-07-21: 50 mg via INTRAVENOUS
  Administered 2020-07-21: 60 mg via INTRAVENOUS
  Administered 2020-07-21 (×2): 50 mg via INTRAVENOUS

## 2020-07-21 MED ORDER — CALCIUM CHLORIDE 10 % IV SOLN
1.0000 g | Freq: Once | INTRAVENOUS | Status: AC
  Administered 2020-07-21: 1 g via INTRAVENOUS

## 2020-07-21 MED ORDER — ACETAMINOPHEN 160 MG/5ML PO SOLN
1000.0000 mg | Freq: Four times a day (QID) | ORAL | Status: DC
  Administered 2020-07-22 – 2020-07-23 (×5): 1000 mg
  Filled 2020-07-21 (×5): qty 40.6

## 2020-07-21 MED ORDER — TRANEXAMIC ACID (OHS) PUMP PRIME SOLUTION
2.0000 mg/kg | INTRAVENOUS | Status: DC
  Filled 2020-07-21: qty 1.56

## 2020-07-21 MED ORDER — ACETAMINOPHEN 500 MG PO TABS
1000.0000 mg | ORAL_TABLET | Freq: Four times a day (QID) | ORAL | Status: DC
  Administered 2020-07-23: 1000 mg via ORAL
  Filled 2020-07-21: qty 2

## 2020-07-21 MED ORDER — SODIUM CHLORIDE 0.9 % WEIGHT BASED INFUSION: 1.0000 mL/kg/h | INTRAVENOUS | Status: DC

## 2020-07-21 MED ORDER — CHLORHEXIDINE GLUCONATE 0.12% ORAL RINSE (MEDLINE KIT)
15.0000 mL | Freq: Two times a day (BID) | OROMUCOSAL | Status: DC
  Administered 2020-07-21 – 2020-07-22 (×3): 15 mL via OROMUCOSAL

## 2020-07-21 MED ORDER — PANTOPRAZOLE SODIUM 40 MG PO TBEC: 40.0000 mg | DELAYED_RELEASE_TABLET | Freq: Every day | ORAL | Status: DC

## 2020-07-21 MED ORDER — NITROGLYCERIN IN D5W 200-5 MCG/ML-% IV SOLN: 0.0000 ug/min | INTRAVENOUS | Status: DC

## 2020-07-21 MED ORDER — HEMOSTATIC AGENTS (NO CHARGE) OPTIME
TOPICAL | Status: DC | PRN
  Administered 2020-07-21 (×4): 1 via TOPICAL

## 2020-07-21 MED ORDER — HEPARIN (PORCINE) IN NACL 1000-0.9 UT/500ML-% IV SOLN
INTRAVENOUS | Status: AC
  Filled 2020-07-21: qty 1500

## 2020-07-21 MED ORDER — SODIUM BICARBONATE 8.4 % IV SOLN: 100.0000 meq | Freq: Once | INTRAVENOUS | Status: AC

## 2020-07-21 MED ORDER — ALBUMIN HUMAN 5 % IV SOLN
25.0000 g | Freq: Once | INTRAVENOUS | Status: AC
  Administered 2020-07-21: 25 g via INTRAVENOUS
  Filled 2020-07-21: qty 500

## 2020-07-21 MED ORDER — ONDANSETRON HCL 4 MG/2ML IJ SOLN: 4.0000 mg | Freq: Four times a day (QID) | INTRAMUSCULAR | Status: DC | PRN

## 2020-07-21 MED ORDER — INSULIN REGULAR(HUMAN) IN NACL 100-0.9 UT/100ML-% IV SOLN
INTRAVENOUS | Status: DC
  Administered 2020-07-22: 3.8 [IU]/h via INTRAVENOUS
  Filled 2020-07-21: qty 100

## 2020-07-21 MED ORDER — BISACODYL 5 MG PO TBEC: 5.0000 mg | DELAYED_RELEASE_TABLET | Freq: Once | ORAL | Status: DC

## 2020-07-21 MED ORDER — PLASMA-LYTE 148 IV SOLN
INTRAVENOUS | Status: DC
  Filled 2020-07-21: qty 2.5

## 2020-07-21 MED ORDER — SODIUM CHLORIDE 0.9 % IV SOLN
INTRAVENOUS | Status: DC
  Administered 2020-07-21: 10 mL/h via INTRAVENOUS

## 2020-07-21 MED ORDER — MIDAZOLAM HCL 2 MG/2ML IJ SOLN
2.0000 mg | INTRAMUSCULAR | Status: DC | PRN
  Administered 2020-07-22 – 2020-07-23 (×5): 2 mg via INTRAVENOUS
  Filled 2020-07-21 (×5): qty 2

## 2020-07-21 MED ORDER — FENTANYL CITRATE (PF) 250 MCG/5ML IJ SOLN
INTRAMUSCULAR | Status: DC | PRN
  Administered 2020-07-21 (×4): 100 ug via INTRAVENOUS
  Administered 2020-07-21: 150 ug via INTRAVENOUS
  Administered 2020-07-21: 50 ug via INTRAVENOUS
  Administered 2020-07-21: 100 ug via INTRAVENOUS
  Administered 2020-07-21 (×2): 150 ug via INTRAVENOUS

## 2020-07-21 MED ORDER — PLATELET RICH PLASMA OPTIME
Status: DC | PRN
  Administered 2020-07-21: 10 mL

## 2020-07-21 MED ORDER — VASOPRESSIN 20 UNITS/100 ML INFUSION FOR SHOCK
0.0000 [IU]/min | INTRAVENOUS | Status: DC
  Administered 2020-07-21: 0.03 [IU]/min via INTRAVENOUS
  Administered 2020-07-22: 0.04 [IU]/min via INTRAVENOUS
  Filled 2020-07-21 (×3): qty 100

## 2020-07-21 MED ORDER — MORPHINE SULFATE (PF) 2 MG/ML IV SOLN
1.0000 mg | INTRAVENOUS | Status: DC | PRN
Start: 2020-07-21 — End: 2020-07-27
  Administered 2020-07-22: 2 mg via INTRAVENOUS
  Filled 2020-07-21: qty 1

## 2020-07-21 MED ORDER — TEMAZEPAM 15 MG PO CAPS: 15.0000 mg | ORAL_CAPSULE | Freq: Once | ORAL | Status: DC | PRN

## 2020-07-21 MED ORDER — ORAL CARE MOUTH RINSE
15.0000 mL | OROMUCOSAL | Status: DC
  Administered 2020-07-21 – 2020-07-23 (×10): 15 mL via OROMUCOSAL

## 2020-07-21 MED ORDER — PROPOFOL 10 MG/ML IV BOLUS
INTRAVENOUS | Status: DC | PRN
  Administered 2020-07-21: 70 mg via INTRAVENOUS

## 2020-07-21 MED ORDER — NOREPINEPHRINE 16 MG/250ML-% IV SOLN
0.0000 ug/min | INTRAVENOUS | Status: DC
  Administered 2020-07-21: 5 ug/min via INTRAVENOUS
  Administered 2020-07-23: 3 ug/min via INTRAVENOUS
  Administered 2020-07-24: 5 ug/min via INTRAVENOUS
  Filled 2020-07-21 (×2): qty 250

## 2020-07-21 MED ORDER — SODIUM BICARBONATE 8.4 % IV SOLN
INTRAVENOUS | Status: AC
  Administered 2020-07-21: 150 meq via INTRAVENOUS
  Filled 2020-07-21: qty 150

## 2020-07-21 MED ORDER — VANCOMYCIN HCL 1000 MG IV SOLR
INTRAVENOUS | Status: DC | PRN
  Administered 2020-07-21: 3 g

## 2020-07-21 MED ORDER — SODIUM CHLORIDE 0.9 % IV SOLN: 250.0000 mL | INTRAVENOUS | Status: DC

## 2020-07-21 MED ORDER — CHLORHEXIDINE GLUCONATE CLOTH 2 % EX PADS
6.0000 | MEDICATED_PAD | Freq: Every day | CUTANEOUS | Status: DC
  Administered 2020-07-21 – 2020-08-04 (×11): 6 via TOPICAL

## 2020-07-21 MED ORDER — ASPIRIN 81 MG PO CHEW
324.0000 mg | CHEWABLE_TABLET | Freq: Every day | ORAL | Status: DC
  Administered 2020-07-22 – 2020-07-24 (×2): 324 mg
  Filled 2020-07-21 (×2): qty 4

## 2020-07-21 MED ORDER — LIDOCAINE HCL (PF) 1 % IJ SOLN
INTRAMUSCULAR | Status: DC | PRN
  Administered 2020-07-21: 15 mL

## 2020-07-21 MED ORDER — MIDAZOLAM HCL 2 MG/2ML IJ SOLN
INTRAMUSCULAR | Status: DC | PRN
  Administered 2020-07-21: 1 mg via INTRAVENOUS

## 2020-07-21 MED ORDER — IOHEXOL 350 MG/ML SOLN
INTRAVENOUS | Status: DC | PRN
  Administered 2020-07-21: 40 mL

## 2020-07-21 MED ORDER — BUPIVACAINE HCL (PF) 0.5 % IJ SOLN
INTRAMUSCULAR | Status: AC
  Filled 2020-07-21: qty 30

## 2020-07-21 MED ORDER — SODIUM BICARBONATE 8.4 % IV SOLN: 150.0000 meq | Freq: Once | INTRAVENOUS | Status: AC

## 2020-07-21 MED ORDER — EPINEPHRINE HCL 5 MG/250ML IV SOLN IN NS
0.0000 ug/min | INTRAVENOUS | Status: DC
  Filled 2020-07-21: qty 250

## 2020-07-21 MED ORDER — PLASMA-LYTE A IV SOLN: INTRAVENOUS | Status: DC

## 2020-07-21 MED ORDER — BUPIVACAINE LIPOSOME 1.3 % IJ SUSP
20.0000 mL | INTRAMUSCULAR | Status: DC
  Filled 2020-07-21: qty 20

## 2020-07-21 MED ORDER — METOCLOPRAMIDE HCL 5 MG/ML IJ SOLN
10.0000 mg | Freq: Four times a day (QID) | INTRAMUSCULAR | Status: AC
  Administered 2020-07-22 (×2): 10 mg via INTRAVENOUS
  Filled 2020-07-21 (×2): qty 2

## 2020-07-21 MED ORDER — PROPOFOL 10 MG/ML IV BOLUS
INTRAVENOUS | Status: AC
  Filled 2020-07-21: qty 20

## 2020-07-21 MED ORDER — ASPIRIN EC 325 MG PO TBEC
325.0000 mg | DELAYED_RELEASE_TABLET | Freq: Every day | ORAL | Status: DC
  Administered 2020-07-23: 325 mg via ORAL
  Filled 2020-07-21: qty 1

## 2020-07-21 MED ORDER — THROMBIN 5000 UNITS EX SOLR
INTRAVENOUS | Status: DC | PRN
  Administered 2020-07-21: 2 mL

## 2020-07-21 MED ORDER — SODIUM CHLORIDE 0.9 % IV SOLN
1.5000 g | Freq: Two times a day (BID) | INTRAVENOUS | Status: AC
  Administered 2020-07-22 – 2020-07-23 (×4): 1.5 g via INTRAVENOUS
  Filled 2020-07-21 (×3): qty 1.5

## 2020-07-21 MED ORDER — SODIUM CHLORIDE 0.9 % IV BOLUS
INTRAVENOUS | Status: AC | PRN
  Administered 2020-07-21: 250 mL via INTRAVENOUS

## 2020-07-21 MED ORDER — SODIUM CHLORIDE 0.45 % IV SOLN
INTRAVENOUS | Status: DC | PRN
Start: 2020-07-21 — End: 2020-07-25

## 2020-07-21 MED ORDER — DEXTROSE 50 % IV SOLN: 0.0000 mL | INTRAVENOUS | Status: DC | PRN

## 2020-07-21 MED ORDER — TRANEXAMIC ACID (OHS) BOLUS VIA INFUSION
15.0000 mg/kg | INTRAVENOUS | Status: AC
  Administered 2020-07-21: 1171.5 mg via INTRAVENOUS
  Filled 2020-07-21: qty 1172

## 2020-07-21 MED ORDER — MIDAZOLAM HCL 2 MG/2ML IJ SOLN
INTRAMUSCULAR | Status: AC
  Filled 2020-07-21: qty 2

## 2020-07-21 MED ORDER — SODIUM BICARBONATE 8.4 % IV SOLN
INTRAVENOUS | Status: AC
  Administered 2020-07-21: 100 meq via INTRAVENOUS
  Filled 2020-07-21: qty 100

## 2020-07-21 MED ORDER — DEXMEDETOMIDINE HCL IN NACL 400 MCG/100ML IV SOLN
0.0000 ug/kg/h | INTRAVENOUS | Status: DC
  Administered 2020-07-21 – 2020-07-22 (×2): 0.7 ug/kg/h via INTRAVENOUS
  Administered 2020-07-22 (×2): 0.5 ug/kg/h via INTRAVENOUS
  Administered 2020-07-23: 0.7 ug/kg/h via INTRAVENOUS
  Administered 2020-07-23: 0.5 ug/kg/h via INTRAVENOUS
  Administered 2020-07-24 – 2020-07-25 (×2): 0.7 ug/kg/h via INTRAVENOUS
  Filled 2020-07-21 (×9): qty 100

## 2020-07-21 MED ORDER — PROTAMINE SULFATE 10 MG/ML IV SOLN
INTRAVENOUS | Status: DC | PRN
  Administered 2020-07-21: 250 mg via INTRAVENOUS
  Administered 2020-07-21: 50 mg via INTRAVENOUS

## 2020-07-21 MED ORDER — TRANEXAMIC ACID 1000 MG/10ML IV SOLN
1.5000 mg/kg/h | INTRAVENOUS | Status: AC
  Administered 2020-07-21: 1.5 mg/kg/h via INTRAVENOUS
  Filled 2020-07-21: qty 25

## 2020-07-21 MED ORDER — HEPARIN SODIUM (PORCINE) 1000 UNIT/ML IJ SOLN
INTRAMUSCULAR | Status: DC | PRN
  Administered 2020-07-21: 25000 [IU] via INTRAVENOUS

## 2020-07-21 MED ORDER — MIDAZOLAM HCL 5 MG/5ML IJ SOLN
INTRAMUSCULAR | Status: DC | PRN
  Administered 2020-07-21 (×5): 2 mg via INTRAVENOUS

## 2020-07-21 MED ORDER — CHLORHEXIDINE GLUCONATE 0.12 % MT SOLN: 15.0000 mL | Freq: Once | OROMUCOSAL | Status: DC

## 2020-07-21 MED ORDER — VANCOMYCIN HCL 1250 MG/250ML IV SOLN
1250.0000 mg | INTRAVENOUS | Status: AC
  Administered 2020-07-21: 1250 mg via INTRAVENOUS
  Filled 2020-07-21: qty 250

## 2020-07-21 MED ORDER — METOPROLOL TARTRATE 12.5 MG HALF TABLET: 12.5000 mg | ORAL_TABLET | Freq: Once | ORAL | Status: DC

## 2020-07-21 MED ORDER — LACTATED RINGERS IV SOLN
INTRAVENOUS | Status: DC
  Administered 2020-07-22: 20 mL/h via INTRAVENOUS

## 2020-07-21 MED ORDER — SODIUM CHLORIDE 0.9 % IV SOLN
750.0000 mg | INTRAVENOUS | Status: AC
  Administered 2020-07-21: 750 mg via INTRAVENOUS
  Filled 2020-07-21: qty 750

## 2020-07-21 MED ORDER — SODIUM BICARBONATE 8.4 % IV SOLN
100.0000 meq | Freq: Once | INTRAVENOUS | Status: AC
  Administered 2020-07-21: 100 meq via INTRAVENOUS

## 2020-07-21 MED ORDER — LIDOCAINE HCL (PF) 1 % IJ SOLN
INTRAMUSCULAR | Status: AC
  Filled 2020-07-21: qty 30

## 2020-07-21 MED ORDER — CALCIUM CHLORIDE 10 % IV SOLN
INTRAVENOUS | Status: DC | PRN
  Administered 2020-07-21 (×2): 500 mg via INTRAVENOUS

## 2020-07-21 MED ORDER — MAGNESIUM SULFATE 50 % IJ SOLN
40.0000 meq | INTRAMUSCULAR | Status: DC
  Filled 2020-07-21: qty 9.85

## 2020-07-21 MED ORDER — MILRINONE LACTATE IN DEXTROSE 20-5 MG/100ML-% IV SOLN
0.3000 ug/kg/min | INTRAVENOUS | Status: DC
  Filled 2020-07-21: qty 100

## 2020-07-21 MED ORDER — STERILE WATER FOR INJECTION IJ SOLN
INTRAMUSCULAR | Status: AC
  Filled 2020-07-21: qty 10

## 2020-07-21 MED ORDER — NOREPINEPHRINE 4 MG/250ML-% IV SOLN
0.0000 ug/min | INTRAVENOUS | Status: DC
  Filled 2020-07-21: qty 250

## 2020-07-21 MED ORDER — SODIUM CHLORIDE 0.9 % IV SOLN
INTRAVENOUS | Status: DC
  Filled 2020-07-21: qty 30

## 2020-07-21 MED ORDER — PHENYLEPHRINE HCL-NACL 20-0.9 MG/250ML-% IV SOLN
30.0000 ug/min | INTRAVENOUS | Status: AC
  Administered 2020-07-21: 30 ug/min via INTRAVENOUS
  Filled 2020-07-21: qty 250

## 2020-07-21 MED ORDER — EPINEPHRINE HCL 5 MG/250ML IV SOLN IN NS
0.5000 ug/min | INTRAVENOUS | Status: DC
  Administered 2020-07-21: 5 ug/min via INTRAVENOUS
  Administered 2020-07-22: 2 ug/min via INTRAVENOUS
  Administered 2020-07-22 – 2020-07-23 (×2): 3 ug/min via INTRAVENOUS
  Filled 2020-07-21 (×2): qty 250

## 2020-07-21 MED ORDER — ALBUMIN HUMAN 5 % IV SOLN: INTRAVENOUS | Status: DC | PRN

## 2020-07-21 MED ORDER — PLATELET POOR PLASMA OPTIME
Status: DC | PRN
  Administered 2020-07-21: 10 mL

## 2020-07-21 MED ORDER — FENTANYL CITRATE (PF) 250 MCG/5ML IJ SOLN
INTRAMUSCULAR | Status: AC
  Filled 2020-07-21: qty 20

## 2020-07-21 MED ORDER — PHENYLEPHRINE 40 MCG/ML (10ML) SYRINGE FOR IV PUSH (FOR BLOOD PRESSURE SUPPORT)
PREFILLED_SYRINGE | INTRAVENOUS | Status: AC
  Filled 2020-07-21: qty 10

## 2020-07-21 SURGICAL SUPPLY — 8 items
BALLN IABP SENSA PLUS 8F 50CC (BALLOONS) ×2
BALLOON IABP SENS PLUS 8F 50CC (BALLOONS) ×1 IMPLANT
CATH INFINITI 5FR MULTPACK ANG (CATHETERS) ×2 IMPLANT
KIT HEART LEFT (KITS) ×2 IMPLANT
PACK CARDIAC CATHETERIZATION (CUSTOM PROCEDURE TRAY) ×2 IMPLANT
SHEATH PINNACLE 5F 10CM (SHEATH) ×2 IMPLANT
TRANSDUCER W/STOPCOCK (MISCELLANEOUS) ×2 IMPLANT
WIRE EMERALD 3MM-J .035X150CM (WIRE) ×2 IMPLANT

## 2020-07-21 SURGICAL SUPPLY — 95 items
APPLICATOR TIP COSEAL (VASCULAR PRODUCTS) IMPLANT
BAG DECANTER FOR FLEXI CONT (MISCELLANEOUS) ×4 IMPLANT
BLADE STERNUM SYSTEM 6 (BLADE) ×4 IMPLANT
BLADE SURG 11 STRL SS (BLADE) ×4 IMPLANT
BNDG ELASTIC 4X5.8 VLCR STR LF (GAUZE/BANDAGES/DRESSINGS) ×8 IMPLANT
BNDG ELASTIC 6X5.8 VLCR STR LF (GAUZE/BANDAGES/DRESSINGS) ×8 IMPLANT
BNDG GAUZE ELAST 4 BULKY (GAUZE/BANDAGES/DRESSINGS) ×8 IMPLANT
CANISTER SUCT 3000ML PPV (MISCELLANEOUS) ×4 IMPLANT
CANISTER WOUND CARE 500ML ATS (WOUND CARE) ×4 IMPLANT
CANNULA GUNDRY RCSP 15FR (MISCELLANEOUS) ×4 IMPLANT
CATH CPB KIT HENDRICKSON (MISCELLANEOUS) ×4 IMPLANT
CATH ROBINSON RED A/P 18FR (CATHETERS) ×8 IMPLANT
CLIP RETRACTION 3.0MM CORONARY (MISCELLANEOUS) IMPLANT
CLIP VESOCCLUDE SM WIDE 24/CT (CLIP) ×8 IMPLANT
DEFOGGER ANTIFOG KIT (MISCELLANEOUS) ×4 IMPLANT
DERMABOND ADVANCED (GAUZE/BANDAGES/DRESSINGS) ×3
DERMABOND ADVANCED .7 DNX12 (GAUZE/BANDAGES/DRESSINGS) ×9 IMPLANT
DRAIN CHANNEL 28F RND 3/8 FF (WOUND CARE) ×12 IMPLANT
DRAPE CARDIOVASCULAR INCISE (DRAPES) ×4
DRAPE INCISE IOBAN 66X45 STRL (DRAPES) ×4 IMPLANT
DRAPE SLUSH/WARMER DISC (DRAPES) ×4 IMPLANT
DRAPE SRG 135X102X78XABS (DRAPES) ×3 IMPLANT
DRAPE SURG 17X23 STRL (DRAPES) ×4 IMPLANT
DRESSING PEEL AND PLAC PRVNA20 (GAUZE/BANDAGES/DRESSINGS) ×3 IMPLANT
DRSG AQUACEL AG ADV 3.5X14 (GAUZE/BANDAGES/DRESSINGS) ×4 IMPLANT
DRSG PEEL AND PLACE PREVENA 20 (GAUZE/BANDAGES/DRESSINGS) ×4
ELECT CAUTERY BLADE 6.4 (BLADE) ×4 IMPLANT
ELECT REM PT RETURN 9FT ADLT (ELECTROSURGICAL) ×8
ELECTRODE REM PT RTRN 9FT ADLT (ELECTROSURGICAL) ×6 IMPLANT
FELT TEFLON 1X6 (MISCELLANEOUS) ×4 IMPLANT
GAUZE SPONGE 4X4 12PLY STRL (GAUZE/BANDAGES/DRESSINGS) ×8 IMPLANT
GAUZE SPONGE 4X4 12PLY STRL LF (GAUZE/BANDAGES/DRESSINGS) ×8 IMPLANT
GLOVE NEODERM STRL 7.5  LF PF (GLOVE) ×9
GLOVE NEODERM STRL 7.5 LF PF (GLOVE) ×9 IMPLANT
GLOVE SURG NEODERM 7.5  LF PF (GLOVE) ×3
GOWN STRL REUS W/ TWL LRG LVL3 (GOWN DISPOSABLE) ×12 IMPLANT
GOWN STRL REUS W/TWL LRG LVL3 (GOWN DISPOSABLE) ×16
INSERT FOGARTY XLG (MISCELLANEOUS) ×4 IMPLANT
INSERT SUTURE HOLDER (MISCELLANEOUS) ×4 IMPLANT
IV ADAPTER SYR DOUBLE MALE LL (MISCELLANEOUS) ×4 IMPLANT
KIT APPLICATOR RATIO 11:1 (KITS) ×4 IMPLANT
KIT BASIN OR (CUSTOM PROCEDURE TRAY) ×4 IMPLANT
KIT SUCTION CATH 14FR (SUCTIONS) ×4 IMPLANT
KIT TURNOVER KIT B (KITS) ×4 IMPLANT
KIT VASOVIEW HEMOPRO 2 VH 4000 (KITS) ×4 IMPLANT
NEEDLE 18GX1X1/2 (RX/OR ONLY) (NEEDLE) ×4 IMPLANT
NS IRRIG 1000ML POUR BTL (IV SOLUTION) ×20 IMPLANT
PACK E OPEN HEART (SUTURE) ×4 IMPLANT
PACK OPEN HEART (CUSTOM PROCEDURE TRAY) ×4 IMPLANT
PACK PLATELET PROCEDURE 60 (MISCELLANEOUS) ×4 IMPLANT
PACK SPY-PHI (KITS) ×4 IMPLANT
PAD ARMBOARD 7.5X6 YLW CONV (MISCELLANEOUS) ×8 IMPLANT
PAD ELECT DEFIB RADIOL ZOLL (MISCELLANEOUS) ×4 IMPLANT
PENCIL BUTTON HOLSTER BLD 10FT (ELECTRODE) ×4 IMPLANT
POSITIONER HEAD DONUT 9IN (MISCELLANEOUS) ×4 IMPLANT
POWDER SURGICEL 3.0 GRAM (HEMOSTASIS) ×4 IMPLANT
PUNCH AORTIC ROTATE 4.5MM 8IN (MISCELLANEOUS) ×4 IMPLANT
SEALANT SURG COSEAL 8ML (VASCULAR PRODUCTS) ×4 IMPLANT
SET CARDIOPLEGIA MPS 5001102 (MISCELLANEOUS) ×4 IMPLANT
SUT BONE WAX W31G (SUTURE) ×4 IMPLANT
SUT MNCRL AB 3-0 PS2 18 (SUTURE) ×8 IMPLANT
SUT MNCRL AB 4-0 PS2 18 (SUTURE) ×8 IMPLANT
SUT PDS AB 1 CTX 36 (SUTURE) ×8 IMPLANT
SUT PROLENE 3 0 SH DA (SUTURE) ×8 IMPLANT
SUT PROLENE 4 0 RB 1 (SUTURE) ×4
SUT PROLENE 4 0 SH DA (SUTURE) ×8 IMPLANT
SUT PROLENE 4-0 RB1 .5 CRCL 36 (SUTURE) ×3 IMPLANT
SUT PROLENE 5 0 C 1 36 (SUTURE) IMPLANT
SUT PROLENE 6 0 C 1 30 (SUTURE) ×12 IMPLANT
SUT PROLENE 7 0 BV 1 (SUTURE) ×4 IMPLANT
SUT PROLENE 7 0 BV1 MDA (SUTURE) ×4 IMPLANT
SUT PROLENE 8 0 BV175 6 (SUTURE) IMPLANT
SUT PROLENE BLUE 7 0 (SUTURE) ×4 IMPLANT
SUT SILK 2 0 SH CR/8 (SUTURE) IMPLANT
SUT SILK 3 0 SH CR/8 (SUTURE) IMPLANT
SUT STEEL 6MS V (SUTURE) ×4 IMPLANT
SUT STEEL SZ 6 DBL 3X14 BALL (SUTURE) ×4 IMPLANT
SUT VIC AB 1 CTX 36 (SUTURE) ×4
SUT VIC AB 1 CTX36XBRD ANBCTR (SUTURE) ×3 IMPLANT
SUT VIC AB 2-0 CT1 27 (SUTURE) ×4
SUT VIC AB 2-0 CT1 TAPERPNT 27 (SUTURE) ×3 IMPLANT
SUT VIC AB 2-0 CTX 27 (SUTURE) IMPLANT
SUT VIC AB 3-0 X1 27 (SUTURE) IMPLANT
SYR 10ML LL (SYRINGE) ×4 IMPLANT
SYSTEM SAHARA CHEST DRAIN ATS (WOUND CARE) ×4 IMPLANT
TIP DUAL SPRAY TOPICAL (TIP) ×8 IMPLANT
TOWEL GREEN STERILE (TOWEL DISPOSABLE) ×4 IMPLANT
TOWEL GREEN STERILE FF (TOWEL DISPOSABLE) ×4 IMPLANT
TRAY FOLEY SLVR 14FR TEMP STAT (SET/KITS/TRAYS/PACK) ×4 IMPLANT
TUBING ART PRESS 72  MALE/FEM (TUBING) ×8
TUBING ART PRESS 72 MALE/FEM (TUBING) ×6 IMPLANT
TUBING LAP HI FLOW INSUFFLATIO (TUBING) ×4 IMPLANT
UNDERPAD 30X36 HEAVY ABSORB (UNDERPADS AND DIAPERS) ×4 IMPLANT
WATER STERILE IRR 1000ML POUR (IV SOLUTION) ×8 IMPLANT
WATER STERILE IRR 1000ML UROMA (IV SOLUTION) IMPLANT

## 2020-07-21 NOTE — Anesthesia Preprocedure Evaluation (Signed)
Anesthesia Evaluation  Patient identified by MRN, date of birth, ID band Patient awake    Reviewed: Allergy & Precautions, NPO status , Patient's Chart, lab work & pertinent test results  Airway Mallampati: II  TM Distance: >3 FB     Dental  (+) Edentulous Upper, Partial Lower   Pulmonary    breath sounds clear to auscultation       Cardiovascular hypertension,  Rhythm:Regular Rate:Normal     Neuro/Psych    GI/Hepatic   Endo/Other  diabetes  Renal/GU      Musculoskeletal   Abdominal   Peds  Hematology   Anesthesia Other Findings   Reproductive/Obstetrics                             Anesthesia Physical Anesthesia Plan  ASA: IV and emergent  Anesthesia Plan: General   Post-op Pain Management:    Induction: Intravenous  PONV Risk Score and Plan: Ondansetron  Airway Management Planned: Oral ETT  Additional Equipment: Arterial line, CVP, PA Cath, 3D TEE and Ultrasound Guidance Line Placement  Intra-op Plan:   Post-operative Plan: Post-operative intubation/ventilation  Informed Consent: I have reviewed the patients History and Physical, chart, labs and discussed the procedure including the risks, benefits and alternatives for the proposed anesthesia with the patient or authorized representative who has indicated his/her understanding and acceptance.       Plan Discussed with: CRNA and Anesthesiologist  Anesthesia Plan Comments:         Anesthesia Quick Evaluation

## 2020-07-21 NOTE — Transfer of Care (Signed)
Immediate Anesthesia Transfer of Care Note  Patient: Kristin [name]  Procedure(s) Performed: CORONARY ARTERY BYPASS GRAFTING (CABG), TIMES FIVE, USING LEFT INTERNAL MAMMARY ARTERY AND ENDOSCOPICALLY HARVESTED BILATERAL GREATER SAPHENOUS VEINS (N/A Chest) TRANSESOPHAGEAL ECHOCARDIOGRAM (TEE) (N/A ) INDOCYANINE GREEN FLUORESCENCE IMAGING (ICG) (N/A ) ENDOVEIN HARVEST OF BILATERAL GREATER SAPHENOUS VEINS (N/A Leg Upper)  Patient Location: ICU  Anesthesia Type:General  Level of Consciousness: sedated and Patient remains intubated per anesthesia plan  Airway & Oxygen Therapy: Patient remains intubated per anesthesia plan and Patient placed on Ventilator (see vital sign flow sheet for setting)  Post-op Assessment: Report given to RN and Post -op Vital signs reviewed and stable  Post vital signs: Reviewed and stable  Last Vitals:  Vitals Value Taken Time  BP 117/49 07/21/20 1622  Temp 34.9 C 07/21/20 1624  Pulse 80 07/21/20 1624  Resp 12 07/21/20 1624  SpO2 98 % 07/21/20 1624  Vitals shown include unvalidated device data.  Last Pain:  Vitals:   07/21/20 0823  TempSrc:   PainSc: 0-No pain         Complications: No complications documented.

## 2020-07-21 NOTE — Progress Notes (Signed)
Carotid artery duplex has been completed. Preliminary results can be found in CV Proc through chart review.   07/21/20 9:24 AM Kristin [name] RVT

## 2020-07-21 NOTE — H&P (Signed)
History and Physical Interval Note:  07/21/2020 10:32 AM  Kristin [name]  has presented today for surgery, with the diagnosis of Coronary Artery Disease Left Main Disease.  The various methods of treatment have been discussed with the patient and family. After consideration of risks, benefits and other options for treatment, the patient has consented to  Procedure(s): CORONARY ARTERY BYPASS GRAFTING (CABG) (N/A) TRANSESOPHAGEAL ECHOCARDIOGRAM (TEE) (N/A) INDOCYANINE GREEN FLUORESCENCE IMAGING (ICG) (N/A) ENDOVEIN HARVEST OF GREATER SAPHENOUS VEIN (Right) as a surgical intervention.  The patient's history has been reviewed, patient examined, no change in status, stable for surgery.  I have reviewed the patient's chart and labs.  Questions were answered to the patient's satisfaction.     [name]

## 2020-07-21 NOTE — Brief Op Note (Signed)
07/20/2020 - 07/21/2020  2:18 PM  PATIENT:  Kristin [name]  80 y.o. female  PRE-OPERATIVE DIAGNOSIS:  Coronary Artery Disease Left Main Disease  POST-OPERATIVE DIAGNOSIS:  Coronary Artery Disease Left Main Disease  PROCEDURE:  Procedure(s):  CORONARY ARTERY BYPASS GRAFTING x 5 -LIMA to LAD -SEQ SVG to OM and Diagonal -SEQ to PL and PDA  ENDOSCOPIC HARVEST GREATER SAPHENOUS VEIN -Right and Left Thigh (46/15)  TRANSESOPHAGEAL ECHOCARDIOGRAM (TEE) (N/A)  INDOCYANINE GREEN FLUORESCENCE IMAGING (ICG) (N/A)   SURGEON:  Surgeon(s) and Role:    * [name], MD - Primary  PHYSICIAN ASSISTANT: Ellwood Handler PA-C  ANESTHESIA:   general  EBL:  Per anes record   BLOOD ADMINISTERED: CELLSAVER   FFP  DRAINS: Left Pleural Chest Tube, Mediastinal Chest Drains   LOCAL MEDICATIONS USED:  NONE  SPECIMEN:  No Specimen  DISPOSITION OF SPECIMEN:  N/A  COUNTS:  YES  TOURNIQUET:  * No tourniquets in log *  DICTATION: .Dragon Dictation  PLAN OF CARE: Admit to inpatient   PATIENT DISPOSITION:  ICU - intubated and hemodynamically stable.   Delay start of Pharmacological VTE agent (>24hrs) due to surgical blood loss or risk of bleeding: yes

## 2020-07-21 NOTE — Anesthesia Postprocedure Evaluation (Signed)
Anesthesia Post Note  Patient: Kristin [name]  Procedure(s) Performed: CORONARY ARTERY BYPASS GRAFTING (CABG), TIMES FIVE, USING LEFT INTERNAL MAMMARY ARTERY AND ENDOSCOPICALLY HARVESTED BILATERAL GREATER SAPHENOUS VEINS (N/A Chest) TRANSESOPHAGEAL ECHOCARDIOGRAM (TEE) (N/A ) INDOCYANINE GREEN FLUORESCENCE IMAGING (ICG) (N/A ) ENDOVEIN HARVEST OF BILATERAL GREATER SAPHENOUS VEINS (N/A Leg Upper)     Patient location during evaluation: SICU Anesthesia Type: General Level of consciousness: sedated and patient remains intubated per anesthesia plan Pain management: pain level controlled Vital Signs Assessment: post-procedure vital signs reviewed and stable Respiratory status: patient remains intubated per anesthesia plan and patient on ventilator - see flowsheet for VS Cardiovascular status: stable Anesthetic complications: no   No complications documented.  Last Vitals:  Vitals:   07/21/20 0900 07/21/20 1548  BP: (!) 154/128   Pulse: 65 81  Resp: 14 12  Temp:    SpO2: 100% 100%    Last Pain:  Vitals:   07/21/20 0823  TempSrc:   PainSc: 0-No pain                 Fabian Coca COKER

## 2020-07-21 NOTE — Op Note (Signed)
CARDIOTHORACIC SURGERY OPERATIVE NOTE  Date of Procedure: 07/21/2020  Preoperative Diagnosis: Severe 3-vessel Coronary Artery Disease  Postoperative Diagnosis: Same  Procedure:    Coronary Artery Bypass Grafting x 5  Left Internal Mammary Artery to Distal Left Anterior Descending Coronary Artery; Saphenous Vein Graft to  Posterior Descending Coronary Artery and right PLA as sequenced graft; Saphenous Vein Graft to  Obtuse Marginal Branch of Left Circumflex Coronary Artery and 1st Diagonal Branch Coronary Artery as sequenced graft; Endoscopic Vein Harvest from bilateral Thighs Completion graft surveillance with indocyanine green fluorescence imaging (SPY)  Surgeon: B. Murvin Natal, MD  Assistant: Leretha Pol, PA-C  Anesthesia: get  Operative Findings:  preserved left ventricular systolic function  Good quality left internal mammary artery conduit  good quality saphenous vein conduit  Fair quality target vessels for grafting    BRIEF CLINICAL NOTE AND INDICATIONS FOR SURGERY  80 yo lady with several cardiac risk factors was awakened from sleep 2 nights ago with burning CP and presented to local ED where NSTEMI diagnosed. Txferred to [facility] for LHC, which was done this am, showing severe 3V CAD including LM disease. She is taken to the OR for CABG.   DETAILS OF THE OPERATIVE PROCEDURE  Preparation:  The patient is brought to the operating room on the above mentioned date and central monitoring was established by the anesthesia team including placement of Swan-Ganz catheter and radial arterial line. The patient is placed in the supine position on the operating table.  Intravenous antibiotics are administered. General endotracheal anesthesia is induced uneventfully. A Foley catheter is placed.  Baseline transesophageal echocardiogram was performed.  Findings were notable for mildly reduced LV function and mild MR.  The patient's chest, abdomen, both groins, and both lower extremities  are prepared and draped in a sterile manner. A time out procedure is performed.   Surgical Approach and Conduit Harvest:  A median sternotomy incision was performed and the left internal mammary artery is dissected from the chest wall and prepared for bypass grafting. The left internal mammary artery is notably good quality conduit. Simultaneously, the greater saphenous vein is obtained from the patient's bilateral thighs using endoscopic vein harvest technique. The saphenous vein conduit is notably good quality . After removal of the saphenous vein, the small surgical incisions in the lower extremity are closed with absorbable suture. Following systemic heparinization, the left internal mammary artery was transected distally noted to have excellent flow.   Extracorporeal Cardiopulmonary Bypass and Myocardial Protection:  The pericardium is opened. The ascending aorta is nondiseased in appearance. The ascending aorta and the right atrium are cannulated for cardiopulmonary bypass.  Adequate heparinization is verified.    A retrograde cardioplegia cannula is placed through the right atrium into the coronary sinus.  The entire pre-bypass portion of the operation was notable for stable hemodynamics.  Cardiopulmonary bypass was begun and the surface of the heart is inspected. Distal target vessels are selected for coronary artery bypass grafting. A cardioplegia cannula is placed in the ascending aorta.   The patient is allowed to cool passively to 34C systemic temperature.  The aortic cross clamp is applied and cold blood cardioplegia is delivered initially in an antegrade fashion through the aortic root.   Supplemental cardioplegia is given retrograde through the coronary sinus catheter.  Iced saline slush is applied for topical hypothermia.  The initial cardioplegic arrest is rapid with early diastolic arrest.  Repeat doses of cardioplegia are administered intermittently throughout the entire cross  clamp portion of the  operation through the aortic root,  through the coronary sinus catheter, and through subsequently placed vein grafts in order to maintain completely flat electrocardiogram.   Coronary Artery Bypass Grafting:   The posterior descending branch of the right coronary artery and the right PLA were grafted using a reversed saphenous vein graft in a sequenced fashion.  At the site of distal anastomoses the target vessels were good quality and measured approximately 1.5 mm in diameter.  The  obtuse marginal branch of the left circumflex coronary artery and 1st diagonal artery were grafted using a reversed saphenous vein graft in a sequenced fashion.  At the site of distal anastomosis the target vessels were good quality and measured approximately 1.5 mm in diameter.    The distal left anterior coronary artery was grafted with the left internal mammary artery in an end-to-side fashion.  At the site of distal anastomosis the target vessel was fair quality and measured approximately 1.3 mm in diameter.  All proximal vein graft anastomoses were placed directly to the ascending aorta prior to removal of the aortic cross clamp.  A reanimation dose of cardioplegia was delivered and deairing procedures were performed before removing the aortic cross clamp.  Procedure Completion:  All proximal and distal coronary anastomoses were inspected for hemostasis and appropriate graft orientation. Epicardial pacing wires are fixed to the right ventricular outflow tract and to the right atrial appendage. The patient is rewarmed to 37C temperature. The patient is weaned and disconnected from cardiopulmonary bypass.  The patient's rhythm at separation from bypass was sinus bradycardia.  The patient was weaned from cardiopulmonary bypass without any inotropic support.  Followup transesophageal echocardiogram performed after separation from bypass revealed  no changes from the preoperative exam.  The  aortic and venous cannula were removed uneventfully. Protamine was administered to reverse the anticoagulation. The mediastinum and pleural space were inspected for hemostasis and irrigated with saline solution. The mediastinum and left pleural space were drained using fluted chest tubes placed through separate stab incisions inferiorly.  The soft tissues anterior to the aorta were reapproximated loosely. The sternum is closed with double strength sternal wire. The soft tissues anterior to the sternum were closed in multiple layers and the skin is closed with a running subcuticular skin closure.  The post-bypass portion of the operation was notable for stable rhythm and hemodynamics.    Disposition:  The patient tolerated the procedure well and is transported to the surgical intensive care in stable condition. There are no intraoperative complications. All sponge instrument and needle counts are verified correct at completion of the operation.    Jayme Cloud, MD 07/21/2020 4:07 PM

## 2020-07-21 NOTE — Anesthesia Procedure Notes (Signed)
Central Venous Catheter Insertion Performed by: Roberts Gaudy, MD, anesthesiologist Start/End3/24/2022 11:05 AM, 07/21/2020 11:10 AM Patient location: Pre-op. Preanesthetic checklist: patient identified, IV checked, site marked, risks and benefits discussed, surgical consent, monitors and equipment checked, pre-op evaluation, timeout performed and anesthesia consent Lidocaine 1% used for infiltration and patient sedated Hand hygiene performed  and maximum sterile barriers used  Catheter size: 9 Fr Sheath introducer Procedure performed using ultrasound guided technique. Ultrasound Notes:anatomy identified, needle tip was noted to be adjacent to the nerve/plexus identified, no ultrasound evidence of intravascular and/or intraneural injection and image(s) printed for medical record Attempts: 1 Following insertion, line sutured and dressing applied. Post procedure assessment: blood return through all ports, free fluid flow and no air  Patient tolerated the procedure well with no immediate complications.

## 2020-07-21 NOTE — Plan of Care (Signed)

## 2020-07-21 NOTE — Interval H&P Note (Signed)
Cath Lab Visit (complete for each Cath Lab visit)  Clinical Evaluation Leading to the Procedure:   ACS: Yes.    Non-ACS:    Anginal Classification: CCS IV  Anti-ischemic medical therapy: Maximal Therapy (2 or more classes of medications)  Non-Invasive Test Results: No non-invasive testing performed  Prior CABG: No previous CABG      History and Physical Interval Note:  07/21/2020 7:29 AM  Clair Cory Roughen  has presented today for surgery, with the diagnosis of nstemi.  The various methods of treatment have been discussed with the patient and family. After consideration of risks, benefits and other options for treatment, the patient has consented to  Procedure(s): LEFT HEART CATH AND CORONARY ANGIOGRAPHY (N/A) as a surgical intervention.  The patient's history has been reviewed, patient examined, no change in status, stable for surgery.  I have reviewed the patient's chart and labs.  Questions were answered to the patient's satisfaction.     Charolette Forward

## 2020-07-21 NOTE — Progress Notes (Signed)
[place] for heparin Indication: chest pain/ACS  Allergies  Allergen Reactions  . Sulfa Antibiotics Hives    Patient Measurements: Height: '5\' 6"'$  (167.6 cm) Weight: 78.1 kg (172 lb 3.2 oz) (scale b) IBW/kg (Calculated) : 59.3 Heparin Dosing Weight: 75 kg  Vital Signs: Temp: 98.1 F (36.7 C) (03/24 0436) Temp Source: Oral (03/24 0436) BP: 104/54 (03/24 0436) Pulse Rate: 68 (03/24 0436)  Labs: Recent Labs    07/20/20 1538 07/20/20 1729 07/20/20 1934 07/21/20 0442  HGB  --   --   --  11.2*  HCT  --   --   --  33.6*  PLT  --   --   --  181  HEPARINUNFRC  --   --  0.25* 0.26*  CREATININE  --   --   --  1.57*  TROPONINIHS 3,198* 3,282*  --   --     Estimated Creatinine Clearance: 31.1 mL/min (A) (by C-G formula based on SCr of 1.57 mg/dL (H)).  Assessment: 80 y.o. female with NSTEMI for heparin  Goal of Therapy:  Heparin level 0.3-0.7 units/ml Monitor platelets by anticoagulation protocol: Yes   Plan:  Increase Heparin 1000 units/hr Follow up after cath today  Phillis Knack, PharmD, BCPS

## 2020-07-21 NOTE — Anesthesia Procedure Notes (Signed)
Procedure Name: Intubation Date/Time: 07/21/2020 10:53 AM Performed by: Trinna Post., CRNA Pre-anesthesia Checklist: Patient identified, Emergency Drugs available, Suction available, Patient being monitored and Timeout performed Patient Re-evaluated:Patient Re-evaluated prior to induction Oxygen Delivery Method: Circle system utilized Preoxygenation: Pre-oxygenation with 100% oxygen Induction Type: IV induction Ventilation: Mask ventilation without difficulty and Oral airway inserted - appropriate to patient size Laryngoscope Size: Mac and 3 Grade View: Grade I Tube type: Oral Tube size: 8.0 mm Number of attempts: 1 Airway Equipment and Method: Stylet Placement Confirmation: ETT inserted through vocal cords under direct vision,  positive ETCO2 and breath sounds checked- equal and bilateral Secured at: 22 cm Tube secured with: Tape Dental Injury: Teeth and Oropharynx as per pre-operative assessment

## 2020-07-21 NOTE — Anesthesia Procedure Notes (Signed)
Central Venous Catheter Insertion Performed by: Roberts Gaudy, MD, anesthesiologist Start/End3/24/2022 11:10 AM, 07/21/2020 11:15 AM Patient location: Pre-op. Preanesthetic checklist: patient identified, IV checked, site marked, risks and benefits discussed, surgical consent, monitors and equipment checked, pre-op evaluation, timeout performed and anesthesia consent Hand hygiene performed  and maximum sterile barriers used  PA cath was placed.Swan type:thermodilution Procedure performed without using ultrasound guided technique. Attempts: 1 Following insertion, line sutured, dressing applied and Biopatch. Post procedure assessment: blood return through all ports and free fluid flow  Patient tolerated the procedure well with no immediate complications. Additional procedure comments: Secured at 45 cm at skin insertion site.

## 2020-07-21 NOTE — Consult Note (Signed)
[address]       [place] [address]             [phone]        Kristin [name] [place] Medical Record [id number] Date of Birth: 03/15/1941  Referring: No ref. provider found Primary Care: Ernestene Kiel, MD Primary Cardiologist:No primary care provider on file.  Chief Complaint:   No chief complaint on file. DOE/NSTEMI  History of Present Illness:      80 yo lady with PMHx for DM, HL, HTN was in [place] until past few months when she noted progressive fatigue and DOE. There was associated mild chest "discomfort" with activity but not typical angina. She did have severe CP 2 nights ago which awoke her. She presented to local ED where dx'd with NSTEMI. Txferred to [facility] for LHC/tx. This was done today, showing severe 3V CAD including thrombotic distal LM CAD. Consult received for CABG. She is stable hemodyn with IABP. Echo shows LV EF 45%  Current Activity/ Functional Status: Patient will be independent with mobility/ambulation, transfers, ADL's, IADL's.   Zubrod Score: At the time of surgery this patient's most appropriate activity status/level should be described as: '[]'$     0    Normal activity, no symptoms '[x]'$     1    Restricted in physical strenuous activity but ambulatory, able to do out light work '[]'$     2    Ambulatory and capable of self care, unable to do work activities, up and about                 more than 50%  Of the time                            '[]'$     3    Only limited self care, in bed greater than 50% of waking hours '[]'$     4    Completely disabled, no self care, confined to bed or chair '[]'$     5    Moribund  Past Medical History:  Diagnosis Date  . Depression   . Diabetes mellitus without complication ([place])   . Hypertension   . Night sweats     Past Surgical History:  Procedure Laterality Date  . EYE SURGERY    . GALLBLADDER SURGERY    . toenail surgery     both big toenails    Social History   Tobacco Use  Smoking  Status Never Smoker  Smokeless Tobacco Never Used    Social History   Substance and Sexual Activity  Alcohol Use No     Allergies  Allergen Reactions  . Sulfa Antibiotics Hives    Current Facility-Administered Medications  Medication Dose Route Frequency Provider Last Rate Last Admin  . 0.9 %  sodium chloride infusion   Intravenous Continuous Charolette Forward, MD   Stopped at 07/21/20 0541  . 0.9 %  sodium chloride infusion  250 mL Intravenous PRN Charolette Forward, MD      . 0.9% sodium chloride infusion  1 mL/kg/hr Intravenous Continuous Charolette Forward, MD      . acetaminophen (TYLENOL) tablet 650 mg  650 mg Oral Q4H PRN Charolette Forward, MD      . aspirin EC tablet 81 mg  81 mg Oral Daily Charolette Forward, MD      . atorvastatin (LIPITOR) tablet 80 mg  80 mg Oral q1800 Charolette Forward, MD  80 mg at 07/20/20 1741  . bisacodyl (DULCOLAX) EC tablet 5 mg  5 mg Oral Once Krystal Teachey, Glenice Bow, MD      . buPROPion (WELLBUTRIN XL) 24 hr tablet 150 mg  150 mg Oral Daily Charolette Forward, MD      . cefUROXime (ZINACEF) 1.5 g in sodium chloride 0.9 % 100 mL IVPB  1.5 g Intravenous To OR Skeet Simmer, RPH      . cefUROXime (ZINACEF) 750 mg in sodium chloride 0.9 % 100 mL IVPB  750 mg Intravenous To OR Skeet Simmer, RPH      . [START ON 07/22/2020] chlorhexidine (PERIDEX) 0.12 % solution 15 mL  15 mL Mouth/Throat Once Jimi Giza, Glenice Bow, MD      . Chlorhexidine Gluconate Cloth 2 % PADS 6 each  6 each Topical Once [name], MD       And  . Chlorhexidine Gluconate Cloth 2 % PADS 6 each  6 each Topical Once Landree Fernholz Z, MD      . dexmedetomidine (PRECEDEX) 400 MCG/100ML (4 mcg/mL) infusion  0.1-0.7 mcg/kg/hr Intravenous To OR Skeet Simmer, RPH      . EPINEPHrine (ADRENALIN) 4 mg in NS 250 mL (0.016 mg/mL) premix infusion  0-10 mcg/min Intravenous To OR Skeet Simmer, RPH      . heparin 30,000 units/NS 1000 mL solution for CELLSAVER   Other To OR Skeet Simmer, [facility]      . heparin  sodium (porcine) 2,500 Units, papaverine 30 mg in electrolyte-148 (PLASMALYTE-148) 500 mL irrigation   Irrigation To OR Skeet Simmer, [facility]      . insulin aspart (novoLOG) injection 0-9 Units  0-9 Units Subcutaneous TID WC Charolette Forward, MD   2 Units at 07/21/20 0704  . insulin regular, human (MYXREDLIN) 100 units/ 100 mL infusion   Intravenous To OR Skeet Simmer, RPH      . magnesium sulfate (IV Push/IM) injection 40 mEq  40 mEq Other To OR Skeet Simmer, [facility]      . metoprolol tartrate (LOPRESSOR) tablet 12.5 mg  12.5 mg Oral BID Charolette Forward, MD   12.5 mg at 07/20/20 2115  . [START ON 07/22/2020] metoprolol tartrate (LOPRESSOR) tablet 12.5 mg  12.5 mg Oral Once Jerzi Tigert, Glenice Bow, MD      . milrinone (PRIMACOR) 20 MG/100 ML (0.2 mg/mL) infusion  0.3 mcg/kg/min Intravenous To OR Skeet Simmer, RPH      . nitroGLYCERIN (NITROGLYN) 2 % ointment 0.5 inch  0.5 inch Topical Q6H Charolette Forward, MD   0.5 inch at 07/21/20 0541  . nitroGLYCERIN (NITROSTAT) SL tablet 0.4 mg  0.4 mg Sublingual Q5 Min x 3 PRN Charolette Forward, MD      . nitroGLYCERIN 50 mg in dextrose 5 % 250 mL (0.2 mg/mL) infusion  2-200 mcg/min Intravenous To OR Skeet Simmer, RPH      . norepinephrine (LEVOPHED) '4mg'$  in 264m premix infusion  0-40 mcg/min Intravenous To OR ESkeet Simmer RPH      . ondansetron ([facility] injection 4 mg  4 mg Intravenous Q6H PRN HCharolette Forward MD      . pantoprazole (PROTONIX) EC tablet 40 mg  40 mg Oral Q0600 HCharolette Forward MD   40 mg at 07/21/20 0541  . phenylephrine (NEOSYNEPHRINE) 20-0.9 MG/250ML-% infusion  30-200 mcg/min Intravenous [facility]     . potassium chloride injection 80 mEq  80 mEq Other To OR  Skeet Simmer, RPH      . sodium chloride flush (NS) 0.9 % injection 3 mL  3 mL Intravenous Q12H Charolette Forward, MD   3 mL at 07/20/20 2115  . sodium chloride flush (NS) 0.9 % injection 3 mL  3 mL Intravenous Q12H Harwani, Prudencio Burly, MD      . sodium chloride flush (NS) 0.9 %  injection 3 mL  3 mL Intravenous PRN Charolette Forward, MD      . temazepam (RESTORIL) capsule 15 mg  15 mg Oral Once PRN Saige Canton, Glenice Bow, MD      . tranexamic acid (CYKLOKAPRON) 2,500 mg in sodium chloride 0.9 % 250 mL (10 mg/mL) infusion  1.5 mg/kg/hr Intravenous To OR Skeet Simmer, RPH      . tranexamic acid (CYKLOKAPRON) bolus via infusion - over 30 minutes 1,171.5 mg  15 mg/kg Intravenous To OR Skeet Simmer, RPH      . tranexamic acid (CYKLOKAPRON) pump prime solution 156 mg  2 mg/kg Intracatheter To OR Skeet Simmer, RPH      . vancomycin (VANCOREADY) IVPB 1250 mg/250 mL  1,250 mg Intravenous To OR Skeet Simmer, [facility]        Medications Prior to Admission  Medication Sig Dispense Refill Last Dose  . albuterol (VENTOLIN HFA) 108 (90 Base) MCG/ACT inhaler Inhale 2 puffs into the lungs every 6 (six) hours as needed for wheezing or shortness of breath.   07/19/2020 at Unknown time  . Alcohol Swabs (ALCOHOL PREP) 70 % PADS    unknown at unknown  . amLODipine (NORVASC) 5 MG tablet Take 5 mg by mouth daily.   07/19/2020 at Unknown time  . ASSURE COMFORT LANCETS 30G MISC    unknown at unknown  . b complex vitamins tablet Take 1 tablet by mouth daily.   07/19/2020 at Unknown time  . Blood Glucose Monitoring Suppl (EMBRACE BLOOD GLUCOSE MONITOR) DEVI    unknown at unknown  . buPROPion (WELLBUTRIN XL) 150 MG 24 hr tablet    07/19/2020 at Unknown time  . cholecalciferol (VITAMIN D) 1000 UNITS tablet Take 1,000 Units by mouth daily.   Past Week at Unknown time  . COMFORT EZ PEN NEEDLES 31G X 8 MM MISC    unknown at unknown  . EMBRACE BLOOD GLUCOSE TEST test strip    unknown at unknown  . glimepiride (AMARYL) 1 MG tablet Take 1 mg by mouth every morning.   07/19/2020 at Unknown time  . LEVEMIR FLEXTOUCH 100 UNIT/ML FlexPen Inject 42 Units into the skin daily.   07/19/2020 at Unknown time  . Multiple Vitamins-Minerals (ZINC PO) Take 1 tablet by mouth daily.   07/19/2020 at Unknown time  . pioglitazone  (ACTOS) 30 MG tablet Take 30 mg by mouth daily.   07/19/2020 at Unknown time  . rosuvastatin (CRESTOR) 40 MG tablet Take 40 mg by mouth daily.   07/19/2020 at Unknown time  . topiramate (TOPAMAX) 50 MG tablet Take 50 mg by mouth daily.   07/19/2020 at Unknown time  . trimethoprim (TRIMPEX) 100 MG tablet Take 100 mg by mouth daily.   07/19/2020 at Unknown time    Family History  Problem Relation Age of Onset  . Diabetes Mother   . Heart attack Mother   . Heart attack Father      Review of Systems:   ROS Pertinent items noted in HPI and remainder of comprehensive ROS otherwise negative.     Cardiac Review of Systems: Darreld Mclean  or  [    ]= no  Chest Pain [    ]  Resting SOB [   ] Exertional SOB  [  ]  Orthopnea [  ]   Pedal Edema [   ]    Palpitations [  ] Syncope  [  ]   Presyncope [   ]  General Review of Systems: [Y] = yes [  ]=no Constitional: recent weight change [  ]; anorexia [  ]; fatigue [  ]; nausea [  ]; night sweats [  ]; fever [  ]; or chills [  ]                                                               Dental: Last Dentist visit:   Eye : blurred vision [  ]; diplopia [   ]; vision changes [  ];  Amaurosis fugax[  ]; Resp: cough [  ];  wheezing[  ];  hemoptysis[  ]; shortness of breath[  ]; paroxysmal nocturnal dyspnea[  ]; dyspnea on exertion[  ]; or orthopnea[  ];  GI:  gallstones[  ], vomiting[  ];  dysphagia[  ]; melena[  ];  hematochezia [  ]; heartburn[  ];   Hx of  Colonoscopy[  ]; GU: kidney stones [  ]; hematuria[  ];   dysuria [  ];  nocturia[  ];  history of     obstruction [  ]; urinary frequency [  ]             Skin: rash, swelling[  ];, hair loss[  ];  peripheral edema[  ];  or itching[  ]; Musculosketetal: myalgias[  ];  joint swelling[  ];  joint erythema[  ];  joint pain[  ];  back pain[  ];  Heme/Lymph: bruising[  ];  bleeding[  ];  anemia[  ];  Neuro: TIA[  ];  headaches[  ];  stroke[  ];  vertigo[  ];  seizures[  ];   paresthesias[  ];  difficulty walking[   ];  Psych:depression[  ]; anxiety[  ];  Endocrine: diabetes[  ];  thyroid dysfunction[  ];     Physical Exam: BP (!) 146/111   Pulse 68   Temp 98.1 F (36.7 C) (Oral)   Resp 18   Ht '5\' 6"'$  (1.676 m)   Wt 78.1 kg   SpO2 100%   BMI 27.79 kg/m    General appearance: alert, cooperative and appears stated age Head: Normocephalic, without obvious abnormality, atraumatic Resp: clear to auscultation bilaterally Cardio: regular rate and rhythm, S1, S2 normal, no murmur, click, rub or gallop GI: soft, non-tender; bowel sounds normal; no masses,  no organomegaly Extremities: extremities normal, atraumatic, no cyanosis or edema Neurologic: Alert and oriented X 3, normal strength and tone. Normal symmetric reflexes. Normal coordination and gait  Diagnostic Studies & Laboratory data:     Recent Radiology Findings:   CARDIAC CATHETERIZATION  Result Date: 07/21/2020  Mid LM lesion is 95% stenosed.  Ost Cx lesion is 99% stenosed.  Dist LM to Ost LAD lesion is 95% stenosed.  Mid LAD lesion is 95% stenosed.  Prox Cx lesion is 90% stenosed.  Prox RCA to Mid RCA lesion is 80% stenosed.  RPDA  lesion is 80% stenosed.  RPAV lesion is 80% stenosed.  Ost LM lesion is 30% stenosed.    ECHOCARDIOGRAM COMPLETE  Result Date: 07/21/2020    ECHOCARDIOGRAM REPORT   Patient Name:   Kristin [name] Date of Exam: 07/20/2020 Medical Rec #:  [id number]       Height:       66.0 in Accession #:    [id number]      Weight:       172.2 lb Date of Birth:  April 03, 1941       BSA:          1.877 m Patient Age:    55 years        BP:           132/77 mmHg Patient Gender: F               HR:           79 bpm. Exam Location:  Inpatient Procedure: 2D Echo, Cardiac Doppler, Color Doppler and Intracardiac            Opacification Agent Indications:     NSTEMI  History:         Patient has no prior history of Echocardiogram examinations.                  Signs/Symptoms:Shortness of Breath; Risk Factors:Hypertension,                   Diabetes and Dyslipidemia.  Sonographer:     Clayton Lefort RDCS (AE) Referring Phys:  [place] Diagnosing Phys: Charolette Forward MD  Sonographer Comments: Technically difficult study due to poor echo windows. Image acquisition challenging due to respiratory motion. IMPRESSIONS  1. Left ventricular ejection fraction, by estimation, is 40 to 45%. The left ventricle has mild to moderately decreased function. The left ventricle demonstrates regional wall motion abnormalities (see scoring diagram/findings for description). Left ventricular diastolic parameters are consistent with Grade I diastolic dysfunction (impaired relaxation).  2. Right ventricular systolic function is normal. The right ventricular size is normal.  3. The mitral valve is normal in structure. Mild mitral valve regurgitation.  4. The aortic valve is calcified. Aortic valve regurgitation is not visualized. Mild aortic valve sclerosis is present, with no evidence of aortic valve stenosis.  5. The inferior vena cava is normal in size with <50% respiratory variability, suggesting right atrial pressure of 8 mmHg. FINDINGS  Left Ventricle: Left ventricular ejection fraction, by estimation, is 40 to 45%. The left ventricle has mild to moderately decreased function. The left ventricle demonstrates regional wall motion abnormalities. Moderate hypokinesis of the left ventricular, mid-apical anterolateral wall. Definity contrast agent was given IV to delineate the left ventricular endocardial borders. The left ventricular internal cavity size was normal in size. There is no left ventricular hypertrophy. Left ventricular diastolic parameters are consistent with Grade I diastolic dysfunction (impaired relaxation). Right Ventricle: The right ventricular size is normal. No increase in right ventricular wall thickness. Right ventricular systolic function is normal. Left Atrium: Left atrial size was normal in size. Right Atrium: Right atrial size was normal in  size. Pericardium: There is no evidence of pericardial effusion. Mitral Valve: The mitral valve is normal in structure. Mild mitral valve regurgitation. MV peak gradient, 6.5 mmHg. The mean mitral valve gradient is 2.0 mmHg. Tricuspid Valve: The tricuspid valve is normal in structure. Tricuspid valve regurgitation is not demonstrated. Aortic Valve: The aortic valve is calcified. Aortic valve regurgitation is  not visualized. Mild aortic valve sclerosis is present, with no evidence of aortic valve stenosis. Aortic valve mean gradient measures 4.0 mmHg. Aortic valve peak gradient measures 6.9 mmHg. Aortic valve area, by VTI measures 2.06 cm. Pulmonic Valve: The pulmonic valve was normal in structure. Pulmonic valve regurgitation is not visualized. Aorta: The aortic root is normal in size and structure. Venous: The inferior vena cava is normal in size with less than 50% respiratory variability, suggesting right atrial pressure of 8 mmHg. IAS/Shunts: No atrial level shunt detected by color flow Doppler.  LEFT VENTRICLE PLAX 2D LVIDd:         4.40 cm  Diastology LVIDs:         3.35 cm  LV e' medial:    4.46 cm/s LV PW:         1.20 cm  LV E/e' medial:  19.3 LV IVS:        1.20 cm  LV e' lateral:   5.44 cm/s LVOT diam:     1.95 cm  LV E/e' lateral: 15.8 LV SV:         50 LV SV Index:   27 LVOT Area:     2.99 cm  RIGHT VENTRICLE             IVC RV Basal diam:  3.00 cm     IVC diam: 1.80 cm RV S prime:     12.70 cm/s TAPSE (M-mode): 1.9 cm LEFT ATRIUM           Index       RIGHT ATRIUM           Index LA diam:      3.20 cm 1.71 cm/m  RA Area:     12.50 cm LA Vol (A2C): 29.1 ml 15.51 ml/m RA Volume:   30.40 ml  16.20 ml/m LA Vol (A4C): 44.3 ml 23.61 ml/m  AORTIC VALVE AV Area (Vmax):    1.95 cm AV Area (Vmean):   2.03 cm AV Area (VTI):     2.06 cm AV Vmax:           131.00 cm/s AV Vmean:          89.600 cm/s AV VTI:            0.245 m AV Peak Grad:      6.9 mmHg AV Mean Grad:      4.0 mmHg LVOT Vmax:         85.50  cm/s LVOT Vmean:        61.000 cm/s LVOT VTI:          0.169 m LVOT/AV VTI ratio: 0.69  AORTA Ao Root diam: 3.15 cm Ao Asc diam:  3.40 cm MITRAL VALVE MV Area (PHT): 3.12 cm     SHUNTS MV Area VTI:   1.83 cm     Systemic VTI:  0.17 m MV Peak grad:  6.5 mmHg     Systemic Diam: 1.95 cm MV Mean grad:  2.0 mmHg MV Vmax:       1.27 m/s MV Vmean:      59.1 cm/s MV Decel Time: 243 msec MV E velocity: 85.90 cm/s MV A velocity: 120.00 cm/s MV E/A ratio:  0.72 Charolette Forward MD Electronically signed by Charolette Forward MD Signature Date/Time: 07/21/2020/3:01:51 AM    Final    VAS US DOPPLER PRE CABG  Result Date: 07/21/2020 PREOPERATIVE VASCULAR EVALUATION  Indications:      Pre-CABG. Risk Factors:  Hypertension, Diabetes. Limitations:      IABP Comparison Study: No prior studies. Performing Technologist: Oliver Hum RVT  Examination Guidelines: A complete evaluation includes B-mode imaging, spectral Doppler, color Doppler, and power Doppler as needed of all accessible portions of each vessel. Bilateral testing is considered an integral part of a complete examination. Limited examinations for reoccurring indications may be performed as noted.  Right Carotid Findings: +----------+--------+--------+--------+-----------------------+--------+           PSV cm/sEDV cm/sStenosisDescribe               Comments +----------+--------+--------+--------+-----------------------+--------+ CCA Prox                          smooth and heterogenous         +----------+--------+--------+--------+-----------------------+--------+ CCA Distal                        smooth and heterogenous         +----------+--------+--------+--------+-----------------------+--------+ ICA Prox                          smooth and heterogenous         +----------+--------+--------+--------+-----------------------+--------+ Portions of this table do not appear on this page. +---------+--------+--------+---------+ VertebralPSV  cm/sEDV cm/sAntegrade +---------+--------+--------+---------+ Waveforms unreliable due to the presence of an IABP. Velocities not recorded. Left Carotid Findings: +----------+--------+--------+--------+-----------------------+--------+           PSV cm/sEDV cm/sStenosisDescribe               Comments +----------+--------+--------+--------+-----------------------+--------+ CCA Prox                          smooth and heterogenous         +----------+--------+--------+--------+-----------------------+--------+ CCA Distal                        smooth and heterogenous         +----------+--------+--------+--------+-----------------------+--------+ ICA Prox                          smooth and heterogenous         +----------+--------+--------+--------+-----------------------+--------+ ICA Distal                                               tortuous +----------+--------+--------+--------+-----------------------+--------+ +---------+--------+--------+---------+ VertebralPSV cm/sEDV cm/sAntegrade +---------+--------+--------+---------+ Waveforms unreliable due to the presence of an IABP. Velocities not recorded.  Summary: Right Carotid: There is no evidence of hemodynamically significant plaque                formation. Left Carotid: There is no evidence of hemodynamically significant plaque               formation. Vertebrals: Bilateral vertebral arteries demonstrate antegrade flow.     Preliminary      I have independently reviewed the above radiologic studies and discussed with the patient   Recent Lab Findings: Lab Results  Component Value Date   WBC 6.9 07/21/2020   HGB 11.2 (L) 07/21/2020   HCT 33.6 (L) 07/21/2020   PLT 181 07/21/2020   GLUCOSE 199 (H) 07/21/2020   CHOL 170 07/21/2020   TRIG 134 07/21/2020   HDL 51 07/21/2020   LDLCALC 92 07/21/2020  NA 139 07/21/2020   K 3.9 07/21/2020   CL 112 (H) 07/21/2020   CREATININE 1.57 (H) 07/21/2020   BUN 26  (H) 07/21/2020   CO2 21 (L) 07/21/2020   HGBA1C 8.8 (H) 07/20/2020      Assessment / Plan:      80 yo lady with multiple cardiac risk factors presents with NSTEMI. She is found to have severe, life-threatening CAD. Fortunately, she is stable currrently. Agree with CABG done urgently as the best medical therapy. She and the family agree with wish to proceed.      I  spent 30 minutes counseling the patient face to face.  Daryn Pisani Z. Orvan Seen, MD [phone] 07/21/2020 9:41 AM

## 2020-07-21 NOTE — Hospital Course (Addendum)
History of Present Illness:  Ms. Kristin [name] is a 80 yo female with history of DM, Hyperlipidemia, and HTN.  The patient has been in her usual state of health until recently.  Over the past several months the patient has developed more fatigue and progressively worse dyspnea on exertion.  The patient had an episode of chest pain 2 nights ago that was retrosternal with radiation into the left arm.  It awoke her from sleep but she didn't seek medical attention.  She awoke the morning and the chest pain occurred again with radiation to her left arm with associated shortness of breath.  She contacted her primary medical doctors office and was advised to go to the ED.  Workup in the ED showed NSR with a left BBB.  Her troponin was mildly elevated.  She was started on Heparin with relief of her chest pain.  She was transferred to [facility] for additional workup.    Hospital Course:  The patient' was chest pain free on arrival to [facility].  It was felt she would require cardiac catheterization and she was agreeable to proceed.  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  SHe required placement of an IABP.  It was felt emergent coronary bypass grafting would be indicated and TCTS consult was obtained.  The patient was evaluated by [name] who was in agreement the patient would require emergent coronary bypass grafting.  The risks and benefits of the procedure were explained to the patient and she was agreeable to proceed.  She was taken to the operating room and underwent CABG x 5 utilizing LIMA to LAD, Sequential SVG to OM and Diagonal, and SVG to PDA and PLVB.  She also underwent endoscopic harvest of greater saphenous vein from her right and left thigh.  She tolerated the procedure without difficulty and was taken to the SICU in stable condition.  The patient remained hypotensive post operatively despite pressor support.  Emergent AHF consult was  requested and bedside Echocardiogram was performed.  This showed evidence of a pericardial effusion with tamponade.  She was taken back to the operating room and underwent mediastinal exploration.  She tolerated the procedure and was taken back to the SICU in critically ill condition.  Her hemodynamics improved post exploration.  She remains on Epinephrine and Vasopressin for support.  She was started on a Lasix drip due to volume overload state.  Her creatinine level increased due to shock/ATN and peaked at 2.01.  She required Milrinone drip to be initiated with low cardiac output, elevated SVR.  She was also treated with Albumin.  She was weaned and extubated on 07/23/2020.  She underwent evaluation by SLP.  They felt she was a moderate aspiration risk.  They recommended the patient remain NPO accept for medications.  The patient was confused and pulled her [name] out.  This persisted and the patient required Precedex to aid in management.  Her volume status improved and she was transitioned to an IV regimen of Lasix.  Milrinone was stopped on 07/25/2020.  She required AV pacing.  Unfortunately her underlying heart rhythm was complete heart block.  EP consult was obtained.  Unfortunately the patients CHB persisted.  It was felt the patient would require placement of PPM.  This was performed by Dr. [name] on 07/27/2020.  She continued to work with SLP and was progressed to a dysphagia 2 diet.  The patient was able to be  weaned off all remaining pressor support.  She was orthostatic and Midodrine was initiated.  She was stable for transfer to the progressive care unit on 07/27/2020.  The patient continues to progress.  Her weight is trending down with the use of Lasix.  She continues to work with SLP and remains on a dysphagia 2 diet.  Her surgical incisions are healing without evidence of infection.  On 07/31/2020 was noted that her creatinine was trending up a little bit to 1.83.  The advanced heart  failure team will reevaluate diuretics in the morning.  Hemoglobin was also noted to be down a little bit to 8.3 and we are continuing to monitor with daily labs.  If it continues to trend lower she may require further evaluation.  It is noted she is on aspirin, Plavix and Lovenox.  She has subsiquentlly been started on apixaban for recurrent afib. She is in sinus nowHer SNF consult has been placed and the transition of care team is assisting with potential placement soon.  On 08/01/2020 she was noted to have complaints of increasing shortness of breath and a chest x-ray was obtained which showed a moderately large sized pleural effusion.  Subsequent to this interventional radiology did an ultrasound-guided thoracentesis with some improvement in her symptoms.  She has also continue to be followed by the advanced heart failure team continue to diurese her aggressively.  Portable CXR on 04/10 showed partially loculated left pleural effusion, increased from previously, atelectasis at bases with ? superimposed pneumonia left base, and smaller right pleural effusion.She underwent repeat left thoracentesis on 4/11 for 500 cc of fluid.  She continues to make slow but steady progress in regard to her physical rehabilitation and social work continues to assist with finding a bed at a skilled nursing facility.

## 2020-07-22 ENCOUNTER — Inpatient Hospital Stay (HOSPITAL_COMMUNITY): Payer: Medicare HMO

## 2020-07-22 ENCOUNTER — Inpatient Hospital Stay (HOSPITAL_COMMUNITY): Payer: Medicare HMO | Admitting: Anesthesiology

## 2020-07-22 ENCOUNTER — Encounter (HOSPITAL_COMMUNITY)
Admission: AD | Disposition: A | Discharge status: Skilled Nursing Facility | Payer: Self-pay | Source: Other Acute Inpatient Hospital | Attending: Cardiothoracic Surgery

## 2020-07-22 ENCOUNTER — Other Ambulatory Visit (HOSPITAL_COMMUNITY): Payer: Medicare HMO

## 2020-07-22 ENCOUNTER — Encounter (HOSPITAL_COMMUNITY): Payer: Self-pay | Admitting: Cardiology

## 2020-07-22 DIAGNOSIS — R570 Cardiogenic shock: Secondary | ICD-10-CM | POA: Diagnosis not present

## 2020-07-22 DIAGNOSIS — I314 Cardiac tamponade: Secondary | ICD-10-CM | POA: Diagnosis not present

## 2020-07-22 HISTORY — PX: MEDIASTINAL EXPLORATION: SHX5065

## 2020-07-22 HISTORY — PX: TEE WITHOUT CARDIOVERSION: SHX5443

## 2020-07-22 LAB — GLUCOSE, CAPILLARY
Glucose-Capillary: 167 mg/dL — ABNORMAL HIGH (ref 70–99)
Glucose-Capillary: 102 mg/dL — ABNORMAL HIGH (ref 70–99)
Glucose-Capillary: 119 mg/dL — ABNORMAL HIGH (ref 70–99)
Glucose-Capillary: 150 mg/dL — ABNORMAL HIGH (ref 70–99)
Glucose-Capillary: 159 mg/dL — ABNORMAL HIGH (ref 70–99)
Glucose-Capillary: 181 mg/dL — ABNORMAL HIGH (ref 70–99)
Glucose-Capillary: 76 mg/dL (ref 70–99)
Glucose-Capillary: 83 mg/dL (ref 70–99)
Glucose-Capillary: 125 mg/dL — ABNORMAL HIGH (ref 70–99)
Glucose-Capillary: 145 mg/dL — ABNORMAL HIGH (ref 70–99)

## 2020-07-22 LAB — PREPARE FRESH FROZEN PLASMA
Unit division: 0
Unit division: 0

## 2020-07-22 LAB — COOXEMETRY PANEL
Carboxyhemoglobin: 0.9 % (ref 0.5–1.5)
Methemoglobin: 1 % (ref 0.0–1.5)
O2 Saturation: 77.3 %
Total hemoglobin: 11.9 g/dL — ABNORMAL LOW (ref 12.0–16.0)

## 2020-07-22 LAB — BPAM FFP
Blood Product Expiration Date: 202203282359
Blood Product Expiration Date: 202203292359
Blood Product Expiration Date: 202203292359
Blood Product Expiration Date: 202203292359
ISSUE DATE / TIME: 202203241418
ISSUE DATE / TIME: 202203241418
ISSUE DATE / TIME: 202203241941
ISSUE DATE / TIME: 202203241941
Unit Type and Rh: 6200
Unit Type and Rh: 6200
Unit Type and Rh: 5100
Unit Type and Rh: 5100

## 2020-07-22 LAB — BASIC METABOLIC PANEL
Anion gap: 19 — ABNORMAL HIGH (ref 5–15)
Anion gap: 9 (ref 5–15)
BUN: 19 mg/dL (ref 8–23)
BUN: 24 mg/dL — ABNORMAL HIGH (ref 8–23)
CO2: 20 mmol/L — ABNORMAL LOW (ref 22–32)
CO2: 27 mmol/L (ref 22–32)
Calcium: 7.5 mg/dL — ABNORMAL LOW (ref 8.9–10.3)
Calcium: 8.3 mg/dL — ABNORMAL LOW (ref 8.9–10.3)
Chloride: 111 mmol/L (ref 98–111)
Chloride: 114 mmol/L — ABNORMAL HIGH (ref 98–111)
Creatinine, Ser: 1.7 mg/dL — ABNORMAL HIGH (ref 0.44–1.00)
Creatinine, Ser: 1.91 mg/dL — ABNORMAL HIGH (ref 0.44–1.00)
GFR, Estimated: 31 mL/min — ABNORMAL LOW (ref >60)
GFR, Estimated: 27 mL/min — ABNORMAL LOW (ref >60)
Glucose, Bld: 124 mg/dL — ABNORMAL HIGH (ref 70–99)
Glucose, Bld: 148 mg/dL — ABNORMAL HIGH (ref 70–99)
Potassium: 4 mmol/L (ref 3.5–5.1)
Potassium: 3.6 mmol/L (ref 3.5–5.1)
Sodium: 150 mmol/L — ABNORMAL HIGH (ref 135–145)
Sodium: 150 mmol/L — ABNORMAL HIGH (ref 135–145)

## 2020-07-22 LAB — POCT I-STAT 7, (LYTES, BLD GAS, ICA,H+H)
Acid-base deficit: 9 mmol/L — ABNORMAL HIGH (ref 0.0–2.0)
Acid-base deficit: 1 mmol/L (ref 0.0–2.0)
Acid-base deficit: 7 mmol/L — ABNORMAL HIGH (ref 0.0–2.0)
Bicarbonate: 17.8 mmol/L — ABNORMAL LOW (ref 20.0–28.0)
Bicarbonate: 20.2 mmol/L (ref 20.0–28.0)
Bicarbonate: 23.3 mmol/L (ref 20.0–28.0)
Calcium, Ion: 1.17 mmol/L (ref 1.15–1.40)
Calcium, Ion: 0.97 mmol/L — ABNORMAL LOW (ref 1.15–1.40)
Calcium, Ion: 1 mmol/L — ABNORMAL LOW (ref 1.15–1.40)
HCT: 22 % — ABNORMAL LOW (ref 36.0–46.0)
HCT: 32 % — ABNORMAL LOW (ref 36.0–46.0)
HCT: 34 % — ABNORMAL LOW (ref 36.0–46.0)
Hemoglobin: 7.5 g/dL — ABNORMAL LOW (ref 12.0–15.0)
Hemoglobin: 10.9 g/dL — ABNORMAL LOW (ref 12.0–15.0)
Hemoglobin: 11.6 g/dL — ABNORMAL LOW (ref 12.0–15.0)
O2 Saturation: 99 %
O2 Saturation: 90 %
O2 Saturation: 95 %
Patient temperature: 36.9
Patient temperature: 35
Patient temperature: 36.3
Potassium: 4.4 mmol/L (ref 3.5–5.1)
Potassium: 3.4 mmol/L — ABNORMAL LOW (ref 3.5–5.1)
Potassium: 4.1 mmol/L (ref 3.5–5.1)
Sodium: 147 mmol/L — ABNORMAL HIGH (ref 135–145)
Sodium: 150 mmol/L — ABNORMAL HIGH (ref 135–145)
Sodium: 150 mmol/L — ABNORMAL HIGH (ref 135–145)
TCO2: 19 mmol/L — ABNORMAL LOW (ref 22–32)
TCO2: 22 mmol/L (ref 22–32)
TCO2: 24 mmol/L (ref 22–32)
pCO2 arterial: 45.3 mmHg (ref 32.0–48.0)
pCO2 arterial: 37.3 mmHg (ref 32.0–48.0)
pCO2 arterial: 43.4 mmHg (ref 32.0–48.0)
pH, Arterial: 7.203 — ABNORMAL LOW (ref 7.350–7.450)
pH, Arterial: 7.265 — ABNORMAL LOW (ref 7.350–7.450)
pH, Arterial: 7.4 (ref 7.350–7.450)
pO2, Arterial: 142 mmHg — ABNORMAL HIGH (ref 83.0–108.0)
pO2, Arterial: 60 mmHg — ABNORMAL LOW (ref 83.0–108.0)
pO2, Arterial: 74 mmHg — ABNORMAL LOW (ref 83.0–108.0)

## 2020-07-22 LAB — CBC
HCT: 35 % — ABNORMAL LOW (ref 36.0–46.0)
HCT: 31.1 % — ABNORMAL LOW (ref 36.0–46.0)
Hemoglobin: 11.5 g/dL — ABNORMAL LOW (ref 12.0–15.0)
Hemoglobin: 10.9 g/dL — ABNORMAL LOW (ref 12.0–15.0)
MCH: 31.3 pg (ref 26.0–34.0)
MCH: 30.9 pg (ref 26.0–34.0)
MCHC: 32.9 g/dL (ref 30.0–36.0)
MCHC: 35 g/dL (ref 30.0–36.0)
MCV: 95.1 fL (ref 80.0–100.0)
MCV: 88.1 fL (ref 80.0–100.0)
Platelets: 70 10*3/uL — ABNORMAL LOW (ref 150–400)
Platelets: 52 10*3/uL — ABNORMAL LOW (ref 150–400)
RBC: 3.68 MIL/uL — ABNORMAL LOW (ref 3.87–5.11)
RBC: 3.53 MIL/uL — ABNORMAL LOW (ref 3.87–5.11)
RDW: 16.7 % — ABNORMAL HIGH (ref 11.5–15.5)
RDW: 16.7 % — ABNORMAL HIGH (ref 11.5–15.5)
WBC: 8.6 10*3/uL (ref 4.0–10.5)
WBC: 9.3 10*3/uL (ref 4.0–10.5)
nRBC: 0.8 % — ABNORMAL HIGH (ref 0.0–0.2)
nRBC: 1.1 % — ABNORMAL HIGH (ref 0.0–0.2)

## 2020-07-22 LAB — PREPARE PLATELET PHERESIS
Unit division: 0
Unit division: 0

## 2020-07-22 LAB — BPAM PLATELET PHERESIS
Blood Product Expiration Date: 202203252359
Blood Product Expiration Date: 202203252359
ISSUE DATE / TIME: 202203241441
ISSUE DATE / TIME: 202203241441
Unit Type and Rh: 6200
Unit Type and Rh: 6200

## 2020-07-22 LAB — PREPARE RBC (CROSSMATCH)

## 2020-07-22 LAB — MAGNESIUM
Magnesium: 2.5 mg/dL — ABNORMAL HIGH (ref 1.7–2.4)
Magnesium: 2.4 mg/dL (ref 1.7–2.4)

## 2020-07-22 SURGERY — EXPLORATION, MEDIASTINUM
Anesthesia: General

## 2020-07-22 MED ORDER — OXYCODONE HCL 5 MG PO TABS: 5.0000 mg | ORAL_TABLET | ORAL | Status: DC | PRN

## 2020-07-22 MED ORDER — MILRINONE LACTATE IN DEXTROSE 20-5 MG/100ML-% IV SOLN
0.1250 ug/kg/min | INTRAVENOUS | Status: DC
  Administered 2020-07-22 – 2020-07-24 (×3): 0.125 ug/kg/min via INTRAVENOUS
  Filled 2020-07-22 (×3): qty 100

## 2020-07-22 MED ORDER — SODIUM BICARBONATE 8.4 % IV SOLN
100.0000 meq | Freq: Once | INTRAVENOUS | Status: AC
  Administered 2020-07-22: 100 meq via INTRAVENOUS

## 2020-07-22 MED ORDER — SODIUM BICARBONATE 8.4 % IV SOLN
50.0000 meq | Freq: Once | INTRAVENOUS | Status: AC
  Administered 2020-07-22: 50 meq via INTRAVENOUS

## 2020-07-22 MED ORDER — MIDAZOLAM HCL 2 MG/2ML IJ SOLN
INTRAMUSCULAR | Status: DC | PRN
  Administered 2020-07-22: 4 mg via INTRAVENOUS

## 2020-07-22 MED ORDER — ALBUMIN HUMAN 5 % IV SOLN
INTRAVENOUS | Status: AC
  Filled 2020-07-22: qty 250

## 2020-07-22 MED ORDER — SODIUM CHLORIDE 0.9% IV SOLUTION: Freq: Once | INTRAVENOUS | Status: DC

## 2020-07-22 MED ORDER — ATORVASTATIN CALCIUM 80 MG PO TABS
80.0000 mg | ORAL_TABLET | Freq: Every day | ORAL | Status: DC
  Administered 2020-07-22: 80 mg
  Filled 2020-07-22: qty 1

## 2020-07-22 MED ORDER — SODIUM CHLORIDE (PF) 0.9 % IJ SOLN
OROMUCOSAL | Status: DC | PRN
  Administered 2020-07-22 (×2): 4 mL via TOPICAL

## 2020-07-22 MED ORDER — POTASSIUM CHLORIDE 10 MEQ/50ML IV SOLN
10.0000 meq | INTRAVENOUS | Status: AC
  Administered 2020-07-22 (×3): 10 meq via INTRAVENOUS

## 2020-07-22 MED ORDER — POTASSIUM CHLORIDE 10 MEQ/50ML IV SOLN
10.0000 meq | INTRAVENOUS | Status: AC
  Administered 2020-07-22 (×3): 10 meq via INTRAVENOUS
  Filled 2020-07-22 (×3): qty 50

## 2020-07-22 MED ORDER — DOCUSATE SODIUM 50 MG/5ML PO LIQD: 200.0000 mg | Freq: Every day | ORAL | Status: DC

## 2020-07-22 MED ORDER — ALBUMIN HUMAN 5 % IV SOLN
12.5000 g | INTRAVENOUS | Status: AC
  Administered 2020-07-22: 12.5 g via INTRAVENOUS
  Filled 2020-07-22: qty 250

## 2020-07-22 MED ORDER — 0.9 % SODIUM CHLORIDE (POUR BTL) OPTIME
TOPICAL | Status: DC | PRN
  Administered 2020-07-22: 4000 mL

## 2020-07-22 MED ORDER — MIDAZOLAM HCL 2 MG/2ML IJ SOLN
INTRAMUSCULAR | Status: AC
  Filled 2020-07-22: qty 4

## 2020-07-22 MED ORDER — SODIUM BICARBONATE 8.4 % IV SOLN
INTRAVENOUS | Status: AC
  Filled 2020-07-22: qty 50

## 2020-07-22 MED ORDER — ROCURONIUM BROMIDE 10 MG/ML (PF) SYRINGE
PREFILLED_SYRINGE | INTRAVENOUS | Status: DC | PRN
  Administered 2020-07-22: 80 mg via INTRAVENOUS

## 2020-07-22 MED ORDER — TRAMADOL HCL 50 MG PO TABS: 50.0000 mg | ORAL_TABLET | ORAL | Status: DC | PRN

## 2020-07-22 MED ORDER — FENTANYL CITRATE (PF) 250 MCG/5ML IJ SOLN
INTRAMUSCULAR | Status: AC
  Filled 2020-07-22: qty 10

## 2020-07-22 MED ORDER — HEMOSTATIC AGENTS (NO CHARGE) OPTIME
TOPICAL | Status: DC | PRN
  Administered 2020-07-22: 1 via TOPICAL

## 2020-07-22 MED ORDER — CALCIUM CHLORIDE 10 % IV SOLN
1.0000 g | Freq: Once | INTRAVENOUS | Status: AC
  Administered 2020-07-22: 1 g via INTRAVENOUS

## 2020-07-22 MED ORDER — FENTANYL CITRATE (PF) 250 MCG/5ML IJ SOLN
INTRAMUSCULAR | Status: DC | PRN
  Administered 2020-07-22 (×2): 250 ug via INTRAVENOUS

## 2020-07-22 MED ORDER — PANTOPRAZOLE SODIUM 40 MG PO PACK
40.0000 mg | PACK | Freq: Every day | ORAL | Status: DC
  Filled 2020-07-22: qty 20

## 2020-07-22 SURGICAL SUPPLY — 43 items
BLADE SURG 10 STRL SS (BLADE) ×2 IMPLANT
CANISTER SUCT 3000ML PPV (MISCELLANEOUS) ×2 IMPLANT
CONN ST 1/4X3/8  BEN (MISCELLANEOUS) ×6
CONN ST 1/4X3/8 BEN (MISCELLANEOUS) ×3 IMPLANT
DRAIN CHANNEL 28F RND 3/8 FF (WOUND CARE) ×6 IMPLANT
DRAPE CARDIOVASCULAR INCISE (DRAPES) ×2
DRAPE INCISE IOBAN 66X45 STRL (DRAPES) IMPLANT
DRAPE SLUSH MACHINE 52X66 (DRAPES) ×2 IMPLANT
DRAPE SRG 135X102X78XABS (DRAPES) ×1 IMPLANT
DRSG AQUACEL AG ADV 3.5X14 (GAUZE/BANDAGES/DRESSINGS) ×2 IMPLANT
ELECT BLADE 4.0 EZ CLEAN MEGAD (MISCELLANEOUS) ×2
ELECT REM PT RETURN 9FT ADLT (ELECTROSURGICAL) ×4
ELECTRODE BLDE 4.0 EZ CLN MEGD (MISCELLANEOUS) ×1 IMPLANT
ELECTRODE REM PT RTRN 9FT ADLT (ELECTROSURGICAL) ×2 IMPLANT
FELT TEFLON 1X6 (MISCELLANEOUS) ×2 IMPLANT
GAUZE SPONGE 4X4 12PLY STRL (GAUZE/BANDAGES/DRESSINGS) ×2 IMPLANT
GAUZE SPONGE 4X4 12PLY STRL LF (GAUZE/BANDAGES/DRESSINGS) ×2 IMPLANT
GLOVE NEODERM STRL 7.5  LF PF (GLOVE) ×1
GLOVE NEODERM STRL 7.5 LF PF (GLOVE) ×1 IMPLANT
GLOVE SURG NEODERM 7.5  LF PF (GLOVE) ×1
GOWN STRL REUS W/ TWL LRG LVL3 (GOWN DISPOSABLE) ×3 IMPLANT
GOWN STRL REUS W/TWL LRG LVL3 (GOWN DISPOSABLE) ×6
HEMOSTAT POWDER SURGIFOAM 1G (HEMOSTASIS) ×4 IMPLANT
INSERT SUTURE HOLDER (MISCELLANEOUS) ×2 IMPLANT
KIT BASIN OR (CUSTOM PROCEDURE TRAY) ×2 IMPLANT
KIT TURNOVER KIT B (KITS) ×2 IMPLANT
NS IRRIG 1000ML POUR BTL (IV SOLUTION) ×2 IMPLANT
PACK OPEN HEART (CUSTOM PROCEDURE TRAY) ×2 IMPLANT
PAD ARMBOARD 7.5X6 YLW CONV (MISCELLANEOUS) ×4 IMPLANT
POWDER SURGICEL 3.0 GRAM (HEMOSTASIS) ×2 IMPLANT
SUT MNCRL AB 3-0 PS2 18 (SUTURE) ×4 IMPLANT
SUT PDS AB 1 CTX 36 (SUTURE) ×4 IMPLANT
SUT PROLENE 5 0 C 1 36 (SUTURE) ×4 IMPLANT
SUT PROLENE 7 0 BV 1 (SUTURE) ×6 IMPLANT
SUT SILK  1 MH (SUTURE) ×6
SUT SILK 1 MH (SUTURE) ×3 IMPLANT
SUT SILK 1 TIES 10X30 (SUTURE) ×2 IMPLANT
SUT STEEL 6MS V (SUTURE) ×2 IMPLANT
SUT STEEL SZ 6 DBL 3X14 BALL (SUTURE) ×2 IMPLANT
SUT VIC AB 2-0 CTX 27 (SUTURE) ×4 IMPLANT
TOWEL GREEN STERILE (TOWEL DISPOSABLE) ×2 IMPLANT
TOWEL GREEN STERILE FF (TOWEL DISPOSABLE) ×2 IMPLANT
WATER STERILE IRR 1000ML POUR (IV SOLUTION) ×2 IMPLANT

## 2020-07-22 NOTE — Progress Notes (Signed)
Initial Nutrition Assessment  DOCUMENTATION CODES:   Not applicable  INTERVENTION:   OG tube coiled in esophagus this AM and repositioning needed. RD discussed with RN  If unable to extubate within 24 hours, recommend initiation of TF  Tube Feeding recommendations via OG:  Pivot 1.5 at 45 ml/hr Provides 1620 kcals, 101 g of protein and 821 mL of free water  NUTRITION DIAGNOSIS:   Inadequate oral intake related to acute illness as evidenced by NPO status.  GOAL:   Patient will meet greater than or equal to 90% of their needs  MONITOR:   Vent status,Labs,Weight trends  REASON FOR ASSESSMENT:   Ventilator    ASSESSMENT:   80 yo female admitted with NSTEMI, required emergent CABG x 5 on 3/24, cardiogenic shock. PMH includes DM, obesity, HTN, HL  3/24 OR for CABG x 5 3/25 Tamponade, OR for Mediastinal exploration, TEE-LVEF 55%  Patient is currently intubated on ventilator support, remains on vasopressin and epinehrpine. IABP in place MV: 10.8 L/min Temp (24hrs), Avg:96.5 F (35.8 C), Min:94.46 F (34.7 C), Max:99.32 F (37.4 C)  Current wt 78 kg  Unable to obtain diet and weight history from patient at this time  Per chest xray, OG tube coiled in esophagus and repositioning recommended  Labs: reviewed; sodium 150 (H), potassium 3.4 (L) Meds: insulin drip, sodium bicarb  Diet Order:   Diet Order    None      EDUCATION NEEDS:   Not appropriate for education at this time  Skin:  Skin Assessment: Skin Integrity Issues: Skin Integrity Issues:: Incisions Incisions: chest, leg  Last BM:  3/22  Height:   Ht Readings from Last 1 Encounters:  07/20/20 '5\' 6"'$  (1.676 m)    Weight:   Wt Readings from Last 1 Encounters:  07/21/20 78.1 kg    BMI:  Body mass index is 27.79 kg/m.  Estimated Nutritional Needs:   Kcal:  1600 kcals  Protein:  85-100 g  Fluid:  >/= 1.5 L    Kerman Passey MS, RDN, LDN, CNSC Registered Dietitian III Clinical  Nutrition RD Pager and On-Call Pager Number Located in [place]

## 2020-07-22 NOTE — Progress Notes (Signed)
Balloon Pump and sheath removed from right groin. Manual pressure applied per Mindi Curling, RN x 35 min. No bleeding or hematoma present. 4x4 dressing applied. Patient's RN aware of site precautions. Patient will be continuously monitored.Cindee Salt

## 2020-07-22 NOTE — Progress Notes (Signed)
  Echocardiogram Echocardiogram Transesophageal has been performed.  Kristin [name] 07/22/2020, 12:32 AM

## 2020-07-22 NOTE — Anesthesia Postprocedure Evaluation (Signed)
Anesthesia Post Note  Patient: Kristin [name]  Procedure(s) Performed: MEDIASTINAL EXPLORATION (N/A ) TRANSESOPHAGEAL ECHOCARDIOGRAM (TEE) (N/A )     Patient location during evaluation: SICU Anesthesia Type: General Level of consciousness: sedated Pain management: pain level controlled Vital Signs Assessment: post-procedure vital signs reviewed and stable Respiratory status: patient remains intubated per anesthesia plan Cardiovascular status: stable Postop Assessment: no apparent nausea or vomiting Anesthetic complications: no   No complications documented.  Last Vitals:  Vitals:   07/22/20 0600 07/22/20 0602  BP: (!) 172/54   Pulse: 89 70  Resp: 16 16  Temp: (!) 35.1 C (!) 35.1 C  SpO2: 99% 99%    Last Pain:  Vitals:   07/22/20 0545  TempSrc: Core  PainSc:                  March Rummage Erykah Lippert

## 2020-07-22 NOTE — Progress Notes (Signed)
Patient transported from 2H05 to OR 17 with no complications.

## 2020-07-22 NOTE — Transfer of Care (Signed)
Immediate Anesthesia Transfer of Care Note  Patient: Kristin [name]  Procedure(s) Performed: MEDIASTINAL EXPLORATION (N/A ) TRANSESOPHAGEAL ECHOCARDIOGRAM (TEE) (N/A )  Patient Location: ICU  Anesthesia Type:General  Level of Consciousness: Patient remains intubated per anesthesia plan  Airway & Oxygen Therapy: Patient remains intubated per anesthesia plan and Patient placed on Ventilator (see vital sign flow sheet for setting)  Post-op Assessment: Report given to RN and Post -op Vital signs reviewed and stable  Post vital signs: Reviewed and stable  Last Vitals:  Vitals Value Taken Time  BP    Temp    Pulse    Resp    SpO2      Last Pain:  Vitals:   07/21/20 2020  TempSrc: Core  PainSc:          Complications: No complications documented.

## 2020-07-22 NOTE — Consult Note (Signed)
   ADVANCED HF/SHOCK TEAM CONSULT NOTE  Requesting: [name]  Indication: Post-cardiotomy shock  80 y/o woman with DM2, obesity, HTN, HL admitted with NSTEMI and found to have critical LM disease. IABP placed.   Taken for emergent CABG today with DR. Atkins.   Post -CABG patient has remained hypotensive despite increase vasopressors, IVF and blood products.  Hemodynamics suggestive of tamponade  CVP 15  PA 27/16 (19) Thermo 2.6/1.4  AVpaced 90   On IABP pressures 107/12 On radial arterial line 65/49 (56)  General:  Sedated on vent  HEENT: normal +ETT Neck: supple. RIJ swan Carotids 2+ bilat; no bruits. No lymphadenopathy or thryomegaly appreciated. Cor: Sternal dressing. + CTs  Lungs: coarse Abdomen: obese No hepatosplenomegaly. No bruits or masses. Quiet  bowel sounds. Extremities: no cyanosis, clubbing, rash, edema  Pale. Cool  Neuro: intubated sedated  TEE done emergently  LEFT VENTRICLE: EF = 40-45%.The LV has marked LVH and is underfilled.There is external compression of the LV from a fluid collection posterior and lateral to the heart. Suggestive of tamponade.  A)  1. Cardiogenic shock 2. Suspect tamponade physiology 3. CAD with NSTEMI and critical ML disease. S/p emergent CABG 07/21/20  P) Discussed with [name] at bedside. Will return to OR for chest re-exploration.   CRITICAL CARE Performed by: [name]  Total critical care time: 50 minutes  Critical care time was exclusive of separately billable procedures and treating other patients.  Critical care was necessary to treat or prevent imminent or life-threatening deterioration.  Critical care was time spent personally by me (independent of midlevel providers or residents) on the following activities: development of treatment plan with patient and/or surrogate as well as nursing, discussions with consultants, evaluation of patient's response to treatment, examination of patient, obtaining  history from patient or surrogate, ordering and performing treatments and interventions, ordering and review of laboratory studies, ordering and review of radiographic studies, pulse oximetry and re-evaluation of patient's condition.  [name], MD  12:36 AM

## 2020-07-22 NOTE — Anesthesia Preprocedure Evaluation (Signed)
Anesthesia Evaluation  Patient identified by MRN, date of birth, ID band  Reviewed: Patient's Chart, lab work & pertinent test results  Airway Mallampati: Intubated  TM Distance: >3 FB     Dental   Pulmonary    Pulmonary exam normal        Cardiovascular hypertension,  Rhythm:Regular Rate:Normal     Neuro/Psych    GI/Hepatic   Endo/Other  diabetes  Renal/GU      Musculoskeletal   Abdominal   Peds  Hematology   Anesthesia Other Findings   Reproductive/Obstetrics                             Anesthesia Physical  Anesthesia Plan  ASA: IV and emergent  Anesthesia Plan: General   Post-op Pain Management:    Induction: Intravenous  PONV Risk Score and Plan: Ondansetron  Airway Management Planned: Oral ETT  Additional Equipment: Arterial line, CVP, PA Cath, 3D TEE and TEE  Intra-op Plan:   Post-operative Plan: Post-operative intubation/ventilation  Informed Consent: I have reviewed the patients History and Physical, chart, labs and discussed the procedure including the risks, benefits and alternatives for the proposed anesthesia with the patient or authorized representative who has indicated his/her understanding and acceptance.       Plan Discussed with: CRNA and Anesthesiologist  Anesthesia Plan Comments: (Patient s/p CABG for left main disease 07/21/20 found to have pericardial effusion with tamponade physiology taken emergently to the OR for mediastinal exploration  Bedside ECHO 07/22/20 Dr. Haroldine Laws: FINDINGS:  LEFT VENTRICLE: EF = 40-45%.The LV has marked LVH and is underfilled.There is external compression of the LV from a fluid collection posterior an lateral to the heart.   RIGHT VENTRICLE: Moderate HK + swan  LEFT ATRIUM: Dilated. Mild external compression   LEFT ATRIAL APPENDAGE: Not visualized  RIGHT ATRIUM: Dialted  AORTIC VALVE:  Trileaflet. No  AI/AS  MITRAL VALVE:    Normal. Trivial MR  TRICUSPID VALVE: Normal. Trivial TR  PULMONIC VALVE: Normal  INTERATRIAL SEPTUM: Not well visualized  PERICARDIUM: Large echodense collection posterior and lateral to heart.   DESCENDING AORTA: Not visualized)        Anesthesia Quick Evaluation

## 2020-07-22 NOTE — Progress Notes (Signed)
Cardiologist: Terrence Dupont  Advanced Heart Failure Rounding Note   Subjective:    - 3/24 NSTEMI - 3/24 Emergent CABG x 5 (LIMA to LAD, SEQ SVG to OM and Diagonal, SEQ to PL and PDA) - 3/25 take back to OR for tampnade  Went back to the OR emergently this am for tamponade and evacuation. Hemodynamics much improved  Remains intubated an sedated. On epi 2. VP 0.02   Creatinine 1.1 -> 1.4 -> 1.7  Swan #s  CVP 9 PA 20/12 PCWP 14 Thermo 2.6/1.4 SVR 2083  EF on TEE 55%, RV mildly down  Objective:   Weight Range:  Vital Signs:   Temp:  [94.46 F (34.7 C)-98.24 F (36.8 C)] 95.9 F (35.5 C) (03/25 0700) Pulse Rate:  [68-90] 90 (03/25 0812) Resp:  [0-16] 16 (03/25 0812) BP: (98-172)/(38-71) 128/56 (03/25 0812) SpO2:  [95 %-100 %] 99 % (03/25 0812) Arterial Line BP: (122-212)/(15-42) 144/35 (03/25 0700) FiO2 (%):  [50 %] 50 % (03/25 0812) Last BM Date: 07/19/20  Weight change: Filed Weights   07/20/20 1420 07/21/20 0601  Weight: 78.1 kg 78.1 kg    Intake/Output:   Intake/Output Summary (Last 24 hours) at 07/22/2020 0900 Last data filed at 07/22/2020 0800 Gross per 24 hour  Intake 8785.07 ml  Output 3398 ml  Net 5387.07 ml      Physical Exam: General:  Intubated sedated HEENT: normal + ETT Neck: supple. RIJ swan . Carotids 2+ bilat; no bruits. No lymphadenopathy or thryomegaly appreciated. Cor: + dressing. +CTs Lungs: coarse Abdomen: +distended. hypoactive BS Extremities: no cyanosis, clubbing, rash, 2+ edema + IABP Neuro: intubated/sedated  Telemetry: AV paced 70 Personally reviewed   Labs: Basic Metabolic Panel: Recent Labs  Lab 07/20/20 1538 07/21/20 0442 07/21/20 1125 07/21/20 1324 07/21/20 1354 07/21/20 1424 07/21/20 1452 07/21/20 1455 07/21/20 2105 07/21/20 2111 07/21/20 2332 07/22/20 0404 07/22/20 0422 07/22/20 0814  NA  --  139   < > 140   < > 141 144   < > 144 145 147* 150* 150* 150*  K  --  3.9   < > 5.6*   < > 5.5* 4.5   < > 4.0  4.0 4.4 4.0 4.1 3.4*  CL  --  112*   < > 110  --  112* 110  --  116*  --   --  111  --   --   CO2  --  21*  --   --   --   --   --   --  17*  --   --  20*  --   --   GLUCOSE  --  199*   < > 91  --  110* 119*  --  103*  --   --  124*  --   --   BUN  --  26*   < > 24*  --  24* 23  --  20  --   --  19  --   --   CREATININE  --  1.57*   < > 1.10*  --  1.10* 1.10*  --  1.43*  --   --  1.70*  --   --   CALCIUM  --  8.8*  --   --   --   --   --   --  7.6*  --   --  7.5*  --   --   MG 1.9  --   --   --   --   --   --   --   --   --   --  2.5*  --   --    < > = values in this interval not displayed.    Liver Function Tests: No results for input(s): AST, ALT, ALKPHOS, BILITOT, PROT, ALBUMIN in the last 168 hours. No results for input(s): LIPASE, AMYLASE in the last 168 hours. No results for input(s): AMMONIA in the last 168 hours.  CBC: Recent Labs  Lab 07/21/20 0442 07/21/20 1125 07/21/20 1351 07/21/20 1354 07/21/20 1602 07/21/20 2105 07/21/20 2111 07/21/20 2332 07/22/20 0404 07/22/20 0422 07/22/20 0814  WBC 6.9  --   --   --  6.0 10.5  --   --  8.6  --   --   HGB 11.2*   < > 7.8*   < > 7.1* 9.6* 8.8* 7.5* 11.5* 10.9* 11.6*  HCT 33.6*   < > 23.9*   < > 21.0* 29.2* 26.0* 22.0* 35.0* 32.0* 34.0*  MCV 99.7  --   --   --  101.0* 99.0  --   --  95.1  --   --   PLT 181  --  115*  --  116* 134*  --   --  70*  --   --    < > = values in this interval not displayed.    Cardiac Enzymes: No results for input(s): CKTOTAL, CKMB, CKMBINDEX, TROPONINI in the last 168 hours.  BNP: BNP (last 3 results) No results for input(s): BNP in the last 8760 hours.  ProBNP (last 3 results) No results for input(s): PROBNP in the last 8760 hours.    Other results:  Imaging: CT HEAD WO CONTRAST  Addendum Date: 07/21/2020   ADDENDUM REPORT: 07/21/2020 10:39 ADDENDUM: Mild cerebral atrophy. Electronically Signed   By: Zetta Bills M.D.   On: 07/21/2020 10:39   Result Date: 07/21/2020 CLINICAL DATA:   Cerebral atherosclerosis, preop planning for CABG. EXAM: CT HEAD WITHOUT CONTRAST TECHNIQUE: Contiguous axial images were obtained from the base of the skull through the vertex without intravenous contrast. COMPARISON:  February 08, 2012 FINDINGS: Brain: No evidence of acute infarction, hemorrhage, hydrocephalus, extra-axial collection or mass lesion/mass effect. Vascular: No hyperdense vessel or unexpected calcification. Signs of intracranial atherosclerosis involving carotid and basilar arteries. Skull: Normal. Negative for fracture or focal lesion. Sinuses/Orbits: No acute finding. Other: None IMPRESSION: 1. No acute intracranial abnormality. 2. Signs of intracranial atherosclerosis involving carotid and basilar arteries. Electronically Signed: By: Zetta Bills M.D. On: 07/21/2020 10:25   CT CHEST WO CONTRAST  Result Date: 07/21/2020 CLINICAL DATA:  Thoracic aortic disease. EXAM: CT CHEST WITHOUT CONTRAST TECHNIQUE: Multidetector CT imaging of the chest was performed following the standard protocol without IV contrast. COMPARISON:  January 20, 2020. FINDINGS: Cardiovascular: Atherosclerosis of thoracic aorta is noted without aneurysm formation. Normal cardiac size. No pericardial effusion. Extensive coronary artery calcifications are noted. Intra-aortic balloon is noted in the distal portion of the descending thoracic aorta. Mediastinum/Nodes: No enlarged mediastinal or axillary lymph nodes. Thyroid gland, trachea, and esophagus demonstrate no significant findings. Lungs/Pleura: No pneumothorax or pleural effusion is noted. Minimal bilateral posterior basilar subsegmental atelectasis or scarring is noted. Upper Abdomen: No acute abnormality. Musculoskeletal: No chest wall mass or suspicious bone lesions identified. IMPRESSION: 1. Intra-aortic balloon is noted in the distal portion of the descending thoracic aorta. 2. Extensive coronary artery calcifications are noted suggesting coronary artery disease. 3.  Minimal bilateral posterior basilar subsegmental atelectasis or scarring is noted. 4. Aortic atherosclerosis. Aortic Atherosclerosis (ICD10-I70.0). Electronically Signed   By: Bobbe Medico.D.  On: 07/21/2020 10:39   CARDIAC CATHETERIZATION  Result Date: 07/21/2020  Mid LM lesion is 95% stenosed.  Ost Cx lesion is 99% stenosed.  Dist LM to Ost LAD lesion is 95% stenosed.  Mid LAD lesion is 95% stenosed.  Prox Cx lesion is 90% stenosed.  Prox RCA to Mid RCA lesion is 80% stenosed.  RPDA lesion is 80% stenosed.  RPAV lesion is 80% stenosed.  Ost LM lesion is 30% stenosed.    DG Chest Port 1 View  Result Date: 07/22/2020 CLINICAL DATA:  Status post CABG X 5 EXAM: PORTABLE CHEST 1 VIEW COMPARISON:  Radiograph 07/21/2020 FINDINGS: Endotracheal tube tip terminates in the mid trachea 5 cm from the carina. Right IJ catheter sheath remains in place with Swan-Ganz catheter terminating near the pulmonary trunk. A transesophageal tube appears curled within the distal thoracic esophagus, redirected superiorly. Recommend repositioning. Mediastinal and left pleural drains in place. Epicardial pacer wires are seen. Telemetry leads and additional external support devices overlie the chest. Stable postoperative cardiomediastinal contours. Some residual hazy opacities in the lungs may reflect a combination of atelectasis and edema with trace residual left effusion. No visible pneumothorax. Linear lucency extending from the lower mediastinum across the upper abdomen may be related to skin fold. No other acute osseous or soft tissue abnormality. Degenerative changes are present in the imaged spine and shoulders. IMPRESSION: 1. Lines and tubes as described above. 2. A transesophageal tube appears curled within the distal thoracic esophagus, redirected superiorly. Recommend repositioning. 3. Linear lucency extending from the lower mediastinum across the upper abdomen may be related to skin fold, could consider  repositioning and reimaging. 4. Otherwise satisfactory postoperative appearance of the chest. Electronically Signed   By: Lovena Le M.D.   On: 07/22/2020 04:31   DG CHEST PORT 1 VIEW  Result Date: 07/21/2020 CLINICAL DATA:  Cardiorespiratory failure EXAM: PORTABLE CHEST 1 VIEW COMPARISON:  07/21/2020 FINDINGS: Changes of CABG. Support devices including left chest tubes are stable. No pneumothorax. Left base atelectasis with small bilateral effusions. IMPRESSION: Postoperative changes. Small bilateral effusions with left base atelectasis. No pneumothorax. Electronically Signed   By: Rolm Baptise M.D.   On: 07/21/2020 22:15   DG Chest Port 1 View  Result Date: 07/21/2020 CLINICAL DATA:  Status post coronary bypass grafting EXAM: PORTABLE CHEST 1 VIEW COMPARISON:  CT from earlier in the same day. FINDINGS: Cardiac shadow is enlarged. Postsurgical changes are now seen. Endotracheal tube and gastric catheter are noted in satisfactory position. Improved placement of intra-aortic balloon pump is noted just below the aortic knob. Swan-Ganz catheter is noted in the pulmonary outflow tract. Mediastinal and left thoracostomy catheters are seen. No pneumothorax is noted. No focal infiltrate is seen. IMPRESSION: Postsurgical changes with tubes and lines as described above. No acute abnormality noted. Electronically Signed   By: Inez Catalina M.D.   On: 07/21/2020 16:47   ECHOCARDIOGRAM COMPLETE  Result Date: 07/21/2020    ECHOCARDIOGRAM REPORT   Patient Name:   Kristin [name] Date of Exam: 07/20/2020 Medical Rec #:  [id number]       Height:       66.0 in Accession #:    [id number]      Weight:       172.2 lb Date of Birth:  19-Jun-1940       BSA:          1.877 m Patient Age:    35 years        BP:  132/77 mmHg Patient Gender: F               HR:           79 bpm. Exam Location:  Inpatient Procedure: 2D Echo, Cardiac Doppler, Color Doppler and Intracardiac            Opacification Agent Indications:      NSTEMI  History:         Patient has no prior history of Echocardiogram examinations.                  Signs/Symptoms:Shortness of Breath; Risk Factors:Hypertension,                  Diabetes and Dyslipidemia.  Sonographer:     Clayton Lefort RDCS (AE) Referring Phys:  Gonzales Diagnosing Phys: Charolette Forward MD  Sonographer Comments: Technically difficult study due to poor echo windows. Image acquisition challenging due to respiratory motion. IMPRESSIONS  1. Left ventricular ejection fraction, by estimation, is 40 to 45%. The left ventricle has mild to moderately decreased function. The left ventricle demonstrates regional wall motion abnormalities (see scoring diagram/findings for description). Left ventricular diastolic parameters are consistent with Grade I diastolic dysfunction (impaired relaxation).  2. Right ventricular systolic function is normal. The right ventricular size is normal.  3. The mitral valve is normal in structure. Mild mitral valve regurgitation.  4. The aortic valve is calcified. Aortic valve regurgitation is not visualized. Mild aortic valve sclerosis is present, with no evidence of aortic valve stenosis.  5. The inferior vena cava is normal in size with <50% respiratory variability, suggesting right atrial pressure of 8 mmHg. FINDINGS  Left Ventricle: Left ventricular ejection fraction, by estimation, is 40 to 45%. The left ventricle has mild to moderately decreased function. The left ventricle demonstrates regional wall motion abnormalities. Moderate hypokinesis of the left ventricular, mid-apical anterolateral wall. Definity contrast agent was given IV to delineate the left ventricular endocardial borders. The left ventricular internal cavity size was normal in size. There is no left ventricular hypertrophy. Left ventricular diastolic parameters are consistent with Grade I diastolic dysfunction (impaired relaxation). Right Ventricle: The right ventricular size is normal. No increase  in right ventricular wall thickness. Right ventricular systolic function is normal. Left Atrium: Left atrial size was normal in size. Right Atrium: Right atrial size was normal in size. Pericardium: There is no evidence of pericardial effusion. Mitral Valve: The mitral valve is normal in structure. Mild mitral valve regurgitation. MV peak gradient, 6.5 mmHg. The mean mitral valve gradient is 2.0 mmHg. Tricuspid Valve: The tricuspid valve is normal in structure. Tricuspid valve regurgitation is not demonstrated. Aortic Valve: The aortic valve is calcified. Aortic valve regurgitation is not visualized. Mild aortic valve sclerosis is present, with no evidence of aortic valve stenosis. Aortic valve mean gradient measures 4.0 mmHg. Aortic valve peak gradient measures 6.9 mmHg. Aortic valve area, by VTI measures 2.06 cm. Pulmonic Valve: The pulmonic valve was normal in structure. Pulmonic valve regurgitation is not visualized. Aorta: The aortic root is normal in size and structure. Venous: The inferior vena cava is normal in size with less than 50% respiratory variability, suggesting right atrial pressure of 8 mmHg. IAS/Shunts: No atrial level shunt detected by color flow Doppler.  LEFT VENTRICLE PLAX 2D LVIDd:         4.40 cm  Diastology LVIDs:         3.35 cm  LV e' medial:    4.46 cm/s  LV PW:         1.20 cm  LV E/e' medial:  19.3 LV IVS:        1.20 cm  LV e' lateral:   5.44 cm/s LVOT diam:     1.95 cm  LV E/e' lateral: 15.8 LV SV:         50 LV SV Index:   27 LVOT Area:     2.99 cm  RIGHT VENTRICLE             IVC RV Basal diam:  3.00 cm     IVC diam: 1.80 cm RV S prime:     12.70 cm/s TAPSE (M-mode): 1.9 cm LEFT ATRIUM           Index       RIGHT ATRIUM           Index LA diam:      3.20 cm 1.71 cm/m  RA Area:     12.50 cm LA Vol (A2C): 29.1 ml 15.51 ml/m RA Volume:   30.40 ml  16.20 ml/m LA Vol (A4C): 44.3 ml 23.61 ml/m  AORTIC VALVE AV Area (Vmax):    1.95 cm AV Area (Vmean):   2.03 cm AV Area (VTI):      2.06 cm AV Vmax:           131.00 cm/s AV Vmean:          89.600 cm/s AV VTI:            0.245 m AV Peak Grad:      6.9 mmHg AV Mean Grad:      4.0 mmHg LVOT Vmax:         85.50 cm/s LVOT Vmean:        61.000 cm/s LVOT VTI:          0.169 m LVOT/AV VTI ratio: 0.69  AORTA Ao Root diam: 3.15 cm Ao Asc diam:  3.40 cm MITRAL VALVE MV Area (PHT): 3.12 cm     SHUNTS MV Area VTI:   1.83 cm     Systemic VTI:  0.17 m MV Peak grad:  6.5 mmHg     Systemic Diam: 1.95 cm MV Mean grad:  2.0 mmHg MV Vmax:       1.27 m/s MV Vmean:      59.1 cm/s MV Decel Time: 243 msec MV E velocity: 85.90 cm/s MV A velocity: 120.00 cm/s MV E/A ratio:  0.72 Charolette Forward MD Electronically signed by Charolette Forward MD Signature Date/Time: 07/21/2020/3:01:51 AM    Final    ECHO INTRAOPERATIVE TEE  Result Date: 07/21/2020  *INTRAOPERATIVE TRANSESOPHAGEAL REPORT *  Patient Name:   Kristin [name] Date of Exam: 07/21/2020 Medical Rec #:  [id number]       Height:       66.0 in Accession #:    [id number]      Weight:       172.2 lb Date of Birth:  Sep 17, 1940       BSA:          1.88 m Patient Age:    28 years        BP:           154/128 mmHg Patient Gender: F               HR:           65 bpm. Exam Location:  Anesthesiology Transesophogeal exam was perform intraoperatively during surgical procedure. Patient  was closely monitored under general anesthesia during the entirety of examination. Indications:     CAD Sonographer:     Dustin Flock RDCS Performing Phys: [id number] [facility] Z ATKINS Diagnosing Phys: Roberts Gaudy MD Complications: No known complications during this procedure. PRE-OP FINDINGS  Left Ventricle: The left ventricle has mildly reduced systolic function, with an ejection fraction of 45-50%. The cavity size was normal. There is no left ventricular hypertrophy. The LV cavity was normal in diameter and measured 5.1 cm at end-diastole at the mid-papillary level. There was normal LV wall thickness. There was akinesis of the distal anterior  wall and distal anterior septum. The remaining LV wall segments appeared to contract adequately. The LV ejection fraction was estimated at 45-50% using the 2D Simpson's method in the four chamber and 2 chamber views. On the post-bypass exa, the LV systolic function appeared unchanged from the pre-bypass exam and the LV EF was again estimated at 45-50%. Right Ventricle: The right ventricle has normal systolic function. The cavity was normal. There is no increase in right ventricular wall thickness. On the post-bypass exam, the RV size and systolic function appared normal and unchanged from the pre-bypass exam. Left Atrium: No left atrial/left atrial appendage thrombus was detected. Right Atrium: Right atrial size was normal in size. Interatrial Septum: No atrial level shunt detected by color flow Doppler. There is no evidence of a patent foramen ovale. Pericardium: There is no evidence of pericardial effusion. Mitral Valve: The mitral valve leaflets appeared structurally normal and coapted adequately without billowing, porlapsing or flail leaflet segments. There was mild mitral regurgitation with a central jet. Tricuspid Valve: The tricuspid valve was normal in structure. Tricuspid valve regurgitation is mild by color flow Doppler. Aortic Valve: The aortic valve is tricuspid Aortic valve regurgitation is trivial by color flow Doppler. There is no stenosis of the aortic valve. Pulmonic Valve: The pulmonic valve was normal in structure, with normal leaflet excursion. Pulmonic valve regurgitation is trivial by color flow Doppler. Aorta: The aortic root and proximal ascending aorta were normal in diameter with a well defined sino-tubular junction without effacement. There was mild intimal thickening present with no protruding atheromatous disease visualized. The descending thoracic aorta was normal in diameter and measured 2.48 cm. There was scattered Grade 2-3 atheromatous disease noted in the descending aorta. There  was an intra-aortic balloon pump in the descending aorta which appeared to be 3-4 cm distal  to the aortic arch.  Pulmonary Artery: The pulmonary artery is of normal size. Shunts: There is no evidence of an atrial septal defect. +--------------+-------++ LEFT VENTRICLE        +--------------+-------++ PLAX 2D               +--------------+-------++ LVIDd:        5.10 cm +--------------+-------++ LVIDs:                +--------------+-------++ LV PW:        0.95 cm +--------------+-------++ LV IVS:       1.00 cm +--------------+-------++ LV SV:        85 ml   +--------------+-------++ LV SV Index:  44.14   +--------------+-------++                       +--------------+-------++  +--------------+-------++ AORTA                 +--------------+-------++ Ao Sinus diam:3.18 cm +--------------+-------++ Ao STJ diam:  2.8 cm  +--------------+-------++ Ao Asc diam:  3.08 cm +--------------+-------++  Roberts Gaudy MD Electronically signed by Roberts Gaudy MD Signature Date/Time: 07/21/2020/5:37:46 PM    Final    VAS US DOPPLER PRE CABG  Result Date: 07/21/2020 PREOPERATIVE VASCULAR EVALUATION  Indications:      Pre-CABG. Risk Factors:     Hypertension, Diabetes. Limitations:      IABP Comparison Study: No prior studies. Performing Technologist: Oliver Hum RVT  Examination Guidelines: A complete evaluation includes B-mode imaging, spectral Doppler, color Doppler, and power Doppler as needed of all accessible portions of each vessel. Bilateral testing is considered an integral part of a complete examination. Limited examinations for reoccurring indications may be performed as noted.  Right Carotid Findings: +----------+--------+--------+--------+-----------------------+--------+           PSV cm/sEDV cm/sStenosisDescribe               Comments +----------+--------+--------+--------+-----------------------+--------+ CCA Prox                          smooth  and heterogenous         +----------+--------+--------+--------+-----------------------+--------+ CCA Distal                        smooth and heterogenous         +----------+--------+--------+--------+-----------------------+--------+ ICA Prox                          smooth and heterogenous         +----------+--------+--------+--------+-----------------------+--------+ Portions of this table do not appear on this page. +---------+--------+--------+---------+ VertebralPSV cm/sEDV cm/sAntegrade +---------+--------+--------+---------+ Waveforms unreliable due to the presence of an IABP. Velocities not recorded. Left Carotid Findings: +----------+--------+--------+--------+-----------------------+--------+           PSV cm/sEDV cm/sStenosisDescribe               Comments +----------+--------+--------+--------+-----------------------+--------+ CCA Prox                          smooth and heterogenous         +----------+--------+--------+--------+-----------------------+--------+ CCA Distal                        smooth and heterogenous         +----------+--------+--------+--------+-----------------------+--------+ ICA Prox                          smooth and heterogenous         +----------+--------+--------+--------+-----------------------+--------+ ICA Distal                                               tortuous +----------+--------+--------+--------+-----------------------+--------+ +---------+--------+--------+---------+ VertebralPSV cm/sEDV cm/sAntegrade +---------+--------+--------+---------+ Waveforms unreliable due to the presence of an IABP. Velocities not recorded.  Summary: Right Carotid: There is no evidence of hemodynamically significant plaque                formation. Left Carotid: There is no evidence of hemodynamically significant plaque               formation. Vertebrals: Bilateral vertebral arteries demonstrate antegrade flow.   Electronically signed by Monica Martinez MD on 07/21/2020 at 1:04:50 PM.    Final       Medications:     Scheduled Medications: . sodium chloride  Intravenous Once  . sodium chloride   Intravenous Once  . sodium chloride   Intravenous Once  . acetaminophen  1,000 mg Oral Q6H   Or  . acetaminophen (TYLENOL) oral liquid 160 mg/5 mL  1,000 mg Per Tube Q6H  . aspirin EC  325 mg Oral Daily   Or  . aspirin  324 mg Per Tube Daily  . atorvastatin  80 mg Oral q1800  . bisacodyl  10 mg Oral Daily   Or  . bisacodyl  10 mg Rectal Daily  . buPROPion  150 mg Oral Daily  . chlorhexidine  15 mL Mouth/Throat NOW  . chlorhexidine gluconate (MEDLINE KIT)  15 mL Mouth Rinse BID  . Chlorhexidine Gluconate Cloth  6 each Topical Daily  . docusate sodium  200 mg Oral Daily  . mouth rinse  15 mL Mouth Rinse 10 times per day  . metoCLOPramide (REGLAN) injection  10 mg Intravenous Q6H  . metoprolol tartrate  12.5 mg Oral BID   Or  . metoprolol tartrate  12.5 mg Per Tube BID  . [START ON 07/23/2020] pantoprazole  40 mg Oral Daily  . sodium bicarbonate      . sodium chloride flush  3 mL Intravenous Q12H     Infusions: . sodium chloride 10 mL/hr at 07/22/20 0800  . sodium chloride    . sodium chloride 10 mL/hr (07/21/20 1545)  . albumin human 12.5 g (07/21/20 2216)  . cefUROXime (ZINACEF)  IV    . dexmedetomidine (PRECEDEX) IV infusion 0.5 mcg/kg/hr (07/22/20 0800)  . electrolyte-A Stopped (07/22/20 0030)  . epinephrine 2 mcg/min (07/22/20 0455)  . insulin 4.6 mL/hr at 07/22/20 0800  . lactated ringers    . lactated ringers    . lactated ringers 20 mL/hr at 07/21/20 1602  . nitroGLYCERIN 0 mcg/min (07/21/20 1900)  . norepinephrine (LEVOPHED) Adult infusion Stopped (07/22/20 0405)  . phenylephrine (NEO-SYNEPHRINE) Adult infusion Stopped (07/22/20 0149)  . potassium chloride 10 mEq (07/22/20 0831)  . sodium bicarbonate (isotonic) 150 mEq in D5W 1000 mL infusion Stopped (07/22/20 0112)  .  vasopressin 0.02 Units/min (07/22/20 0800)     PRN Medications:  sodium chloride, albumin human, dextrose, lactated ringers, metoprolol tartrate, midazolam, morphine injection, ondansetron (ZOFRAN) IV, oxyCODONE, sodium chloride flush, traMADol   Assessment/Plan:   1. Cardiogenic shock - s/p CABG 3/24 with take back for tamponade - LVEF 55% on post-op TEE - Remains on epi 2 and VP 0.04 + IABP.  - SVR high. Filling pressures low. CO low - Start milrinone. Give 2 bottles albumin. Repeat co-ox and hemodynamics at noon - No diuretics yet   2. CAD s/p NSTEMI  - CABG x 5 3/24 - no evidence of ongoing ischemia - continue ASA, statin. Stop b-blocker with shock - will need DAPT prior to d/c  3. Acute hypoxic respiratory failure post-op - wean vent as tolerated  4. AKI - baseline creatinine 1.1 -> 1.5 -> 1.7 - due to shock/ATN - continue hemodynamic support    5. Acute blood loss anemia, post -op - transfuse to keep hgb >= 7.5   CRITICAL CARE Performed by: [name]  Total critical care time: 45 minutes  Critical care time was exclusive of separately billable procedures and treating other patients.  Critical care was necessary to treat or prevent imminent or life-threatening deterioration.  Critical care was time spent personally by me (independent of midlevel providers or residents) on the following activities: development of treatment plan with patient and/or surrogate  as well as nursing, discussions with consultants, evaluation of patient's response to treatment, examination of patient, obtaining history from patient or surrogate, ordering and performing treatments and interventions, ordering and review of laboratory studies, ordering and review of radiographic studies, pulse oximetry and re-evaluation of patient's condition.    Length of Stay: 2   [name] MD 07/22/2020, 9:00 AM  Advanced Heart Failure Team Pager [phone] (M-F; [place])  Please  contact [facility] for night-coverage after hours (4p -7a ) and weekends on [email or web address]

## 2020-07-22 NOTE — H&P (Signed)
History and Physical Interval Note:  07/22/2020 12:47 AM  Kristin [name]  has presented today for surgery, with the diagnosis of s/p CABG, tamponade.  The various methods of treatment have been discussed with the patient and family. After consideration of risks, benefits and other options for treatment, the patient has consented to  Procedure(s): MEDIASTINAL EXPLORATION (N/A) TRANSESOPHAGEAL ECHOCARDIOGRAM (TEE) (N/A) as a surgical intervention.  The patient's history has been reviewed, patient examined, no change in status, stable for surgery.  I have reviewed the patient's chart and labs.  Questions were answered to the patient's satisfaction.     [name]

## 2020-07-22 NOTE — Progress Notes (Signed)
Subjective:  Events of yesterday noted patient remains intubated sedated and on multiple pressors.  AV paced rhythm on the monitor.  Intra-aortic balloon pump being weaned off  Objective:  Vital Signs in the last 24 hours: Temp:  [94.46 F (34.7 C)-99.14 F (37.3 C)] 98.96 F (37.2 C) (03/25 1101) Pulse Rate:  [44-90] 90 (03/25 1109) Resp:  [0-16] 16 (03/25 1109) BP: (98-172)/(38-71) 135/64 (03/25 1109) SpO2:  [95 %-100 %] 99 % (03/25 1109) Arterial Line BP: (122-212)/(15-42) 122/42 (03/25 1101) FiO2 (%):  [50 %] 50 % (03/25 1109)  Intake/Output from previous day: 03/24 0701 - 03/25 0700 In: 8747.3 [I.V.:3811.8; Blood:3400; IV Piggyback:1535.5] Out: [id number] [Urine:2040; Blood:573; Chest Tube:650] Intake/Output from this shift: Total I/O In: 238.4 [I.V.:129.4; IV Piggyback:109] Out: 245 [Urine:110; Chest Tube:135]  Physical Exam: Neck: no adenopathy, no carotid bruit, no JVD and supple, symmetrical, trachea midline Lungs: Decreased breath sound at bases bilateral coarse sounds Heart: regular rate and rhythm and S1, S2 normal Abdomen: Soft mildly distended Extremities no clubbing cyanosis 1+ edema right groin puncture site minimal oozing No hematoma Lab Results: Recent Labs    07/21/20 2105 07/21/20 2111 07/22/20 0404 07/22/20 0422 07/22/20 0814  WBC 10.5  --  8.6  --   --   HGB 9.6*   < > 11.5* 10.9* 11.6*  PLT 134*  --  70*  --   --    < > = values in this interval not displayed.   Recent Labs    07/21/20 2105 07/21/20 2111 07/22/20 0404 07/22/20 0422 07/22/20 0814  NA 144   < > 150* 150* 150*  K 4.0   < > 4.0 4.1 3.4*  CL 116*  --  111  --   --   CO2 17*  --  20*  --   --   GLUCOSE 103*  --  124*  --   --   BUN 20  --  19  --   --   CREATININE 1.43*  --  1.70*  --   --    < > = values in this interval not displayed.   No results for input(s): TROPONINI in the last 72 hours.  Invalid input(s): CK, MB Hepatic Function Panel No results for input(s): PROT,  ALBUMIN, AST, ALT, ALKPHOS, BILITOT, BILIDIR, IBILI in the last 72 hours. Recent Labs    07/21/20 0442  CHOL 170   No results for input(s): PROTIME in the last 72 hours.  Imaging: Imaging results have been reviewed and CT HEAD WO CONTRAST  Addendum Date: 07/21/2020   ADDENDUM REPORT: 07/21/2020 10:39 ADDENDUM: Mild cerebral atrophy. Electronically Signed   By: Zetta Bills M.D.   On: 07/21/2020 10:39   Result Date: 07/21/2020 CLINICAL DATA:  Cerebral atherosclerosis, preop planning for CABG. EXAM: CT HEAD WITHOUT CONTRAST TECHNIQUE: Contiguous axial images were obtained from the base of the skull through the vertex without intravenous contrast. COMPARISON:  February 08, 2012 FINDINGS: Brain: No evidence of acute infarction, hemorrhage, hydrocephalus, extra-axial collection or mass lesion/mass effect. Vascular: No hyperdense vessel or unexpected calcification. Signs of intracranial atherosclerosis involving carotid and basilar arteries. Skull: Normal. Negative for fracture or focal lesion. Sinuses/Orbits: No acute finding. Other: None IMPRESSION: 1. No acute intracranial abnormality. 2. Signs of intracranial atherosclerosis involving carotid and basilar arteries. Electronically Signed: By: Zetta Bills M.D. On: 07/21/2020 10:25   CT CHEST WO CONTRAST  Result Date: 07/21/2020 CLINICAL DATA:  Thoracic aortic disease. EXAM: CT CHEST WITHOUT CONTRAST TECHNIQUE: Multidetector CT  imaging of the chest was performed following the standard protocol without IV contrast. COMPARISON:  January 20, 2020. FINDINGS: Cardiovascular: Atherosclerosis of thoracic aorta is noted without aneurysm formation. Normal cardiac size. No pericardial effusion. Extensive coronary artery calcifications are noted. Intra-aortic balloon is noted in the distal portion of the descending thoracic aorta. Mediastinum/Nodes: No enlarged mediastinal or axillary lymph nodes. Thyroid gland, trachea, and esophagus demonstrate no  significant findings. Lungs/Pleura: No pneumothorax or pleural effusion is noted. Minimal bilateral posterior basilar subsegmental atelectasis or scarring is noted. Upper Abdomen: No acute abnormality. Musculoskeletal: No chest wall mass or suspicious bone lesions identified. IMPRESSION: 1. Intra-aortic balloon is noted in the distal portion of the descending thoracic aorta. 2. Extensive coronary artery calcifications are noted suggesting coronary artery disease. 3. Minimal bilateral posterior basilar subsegmental atelectasis or scarring is noted. 4. Aortic atherosclerosis. Aortic Atherosclerosis (ICD10-I70.0). Electronically Signed   By: Marijo Conception M.D.   On: 07/21/2020 10:39   CARDIAC CATHETERIZATION  Result Date: 07/21/2020  Mid LM lesion is 95% stenosed.  Ost Cx lesion is 99% stenosed.  Dist LM to Ost LAD lesion is 95% stenosed.  Mid LAD lesion is 95% stenosed.  Prox Cx lesion is 90% stenosed.  Prox RCA to Mid RCA lesion is 80% stenosed.  RPDA lesion is 80% stenosed.  RPAV lesion is 80% stenosed.  Ost LM lesion is 30% stenosed.    DG Chest Port 1 View  Result Date: 07/22/2020 CLINICAL DATA:  Status post CABG X 5 EXAM: PORTABLE CHEST 1 VIEW COMPARISON:  Radiograph 07/21/2020 FINDINGS: Endotracheal tube tip terminates in the mid trachea 5 cm from the carina. Right IJ catheter sheath remains in place with Swan-Ganz catheter terminating near the pulmonary trunk. A transesophageal tube appears curled within the distal thoracic esophagus, redirected superiorly. Recommend repositioning. Mediastinal and left pleural drains in place. Epicardial pacer wires are seen. Telemetry leads and additional external support devices overlie the chest. Stable postoperative cardiomediastinal contours. Some residual hazy opacities in the lungs may reflect a combination of atelectasis and edema with trace residual left effusion. No visible pneumothorax. Linear lucency extending from the lower mediastinum across the  upper abdomen may be related to skin fold. No other acute osseous or soft tissue abnormality. Degenerative changes are present in the imaged spine and shoulders. IMPRESSION: 1. Lines and tubes as described above. 2. A transesophageal tube appears curled within the distal thoracic esophagus, redirected superiorly. Recommend repositioning. 3. Linear lucency extending from the lower mediastinum across the upper abdomen may be related to skin fold, could consider repositioning and reimaging. 4. Otherwise satisfactory postoperative appearance of the chest. Electronically Signed   By: Lovena Le M.D.   On: 07/22/2020 04:31   DG CHEST PORT 1 VIEW  Result Date: 07/21/2020 CLINICAL DATA:  Cardiorespiratory failure EXAM: PORTABLE CHEST 1 VIEW COMPARISON:  07/21/2020 FINDINGS: Changes of CABG. Support devices including left chest tubes are stable. No pneumothorax. Left base atelectasis with small bilateral effusions. IMPRESSION: Postoperative changes. Small bilateral effusions with left base atelectasis. No pneumothorax. Electronically Signed   By: Rolm Baptise M.D.   On: 07/21/2020 22:15   DG Chest Port 1 View  Result Date: 07/21/2020 CLINICAL DATA:  Status post coronary bypass grafting EXAM: PORTABLE CHEST 1 VIEW COMPARISON:  CT from earlier in the same day. FINDINGS: Cardiac shadow is enlarged. Postsurgical changes are now seen. Endotracheal tube and gastric catheter are noted in satisfactory position. Improved placement of intra-aortic balloon pump is noted just below the aortic knob.  Swan-Ganz catheter is noted in the pulmonary outflow tract. Mediastinal and left thoracostomy catheters are seen. No pneumothorax is noted. No focal infiltrate is seen. IMPRESSION: Postsurgical changes with tubes and lines as described above. No acute abnormality noted. Electronically Signed   By: Inez Catalina M.D.   On: 07/21/2020 16:47   ECHOCARDIOGRAM COMPLETE  Result Date: 07/21/2020    ECHOCARDIOGRAM REPORT   Patient Name:    Kristin [name] Date of Exam: 07/20/2020 Medical Rec #:  [id number]       Height:       66.0 in Accession #:    [id number]      Weight:       172.2 lb Date of Birth:  16-Aug-1940       BSA:          1.877 m Patient Age:    33 years        BP:           132/77 mmHg Patient Gender: F               HR:           79 bpm. Exam Location:  Inpatient Procedure: 2D Echo, Cardiac Doppler, Color Doppler and Intracardiac            Opacification Agent Indications:     NSTEMI  History:         Patient has no prior history of Echocardiogram examinations.                  Signs/Symptoms:Shortness of Breath; Risk Factors:Hypertension,                  Diabetes and Dyslipidemia.  Sonographer:     Clayton Lefort RDCS (AE) Referring Phys:  [place] Diagnosing Phys: Charolette Forward MD  Sonographer Comments: Technically difficult study due to poor echo windows. Image acquisition challenging due to respiratory motion. IMPRESSIONS  1. Left ventricular ejection fraction, by estimation, is 40 to 45%. The left ventricle has mild to moderately decreased function. The left ventricle demonstrates regional wall motion abnormalities (see scoring diagram/findings for description). Left ventricular diastolic parameters are consistent with Grade I diastolic dysfunction (impaired relaxation).  2. Right ventricular systolic function is normal. The right ventricular size is normal.  3. The mitral valve is normal in structure. Mild mitral valve regurgitation.  4. The aortic valve is calcified. Aortic valve regurgitation is not visualized. Mild aortic valve sclerosis is present, with no evidence of aortic valve stenosis.  5. The inferior vena cava is normal in size with <50% respiratory variability, suggesting right atrial pressure of 8 mmHg. FINDINGS  Left Ventricle: Left ventricular ejection fraction, by estimation, is 40 to 45%. The left ventricle has mild to moderately decreased function. The left ventricle demonstrates regional wall motion  abnormalities. Moderate hypokinesis of the left ventricular, mid-apical anterolateral wall. Definity contrast agent was given IV to delineate the left ventricular endocardial borders. The left ventricular internal cavity size was normal in size. There is no left ventricular hypertrophy. Left ventricular diastolic parameters are consistent with Grade I diastolic dysfunction (impaired relaxation). Right Ventricle: The right ventricular size is normal. No increase in right ventricular wall thickness. Right ventricular systolic function is normal. Left Atrium: Left atrial size was normal in size. Right Atrium: Right atrial size was normal in size. Pericardium: There is no evidence of pericardial effusion. Mitral Valve: The mitral valve is normal in structure. Mild mitral valve regurgitation. MV peak gradient, 6.5  mmHg. The mean mitral valve gradient is 2.0 mmHg. Tricuspid Valve: The tricuspid valve is normal in structure. Tricuspid valve regurgitation is not demonstrated. Aortic Valve: The aortic valve is calcified. Aortic valve regurgitation is not visualized. Mild aortic valve sclerosis is present, with no evidence of aortic valve stenosis. Aortic valve mean gradient measures 4.0 mmHg. Aortic valve peak gradient measures 6.9 mmHg. Aortic valve area, by VTI measures 2.06 cm. Pulmonic Valve: The pulmonic valve was normal in structure. Pulmonic valve regurgitation is not visualized. Aorta: The aortic root is normal in size and structure. Venous: The inferior vena cava is normal in size with less than 50% respiratory variability, suggesting right atrial pressure of 8 mmHg. IAS/Shunts: No atrial level shunt detected by color flow Doppler.  LEFT VENTRICLE PLAX 2D LVIDd:         4.40 cm  Diastology LVIDs:         3.35 cm  LV e' medial:    4.46 cm/s LV PW:         1.20 cm  LV E/e' medial:  19.3 LV IVS:        1.20 cm  LV e' lateral:   5.44 cm/s LVOT diam:     1.95 cm  LV E/e' lateral: 15.8 LV SV:         50 LV SV Index:   27  LVOT Area:     2.99 cm  RIGHT VENTRICLE             IVC RV Basal diam:  3.00 cm     IVC diam: 1.80 cm RV S prime:     12.70 cm/s TAPSE (M-mode): 1.9 cm LEFT ATRIUM           Index       RIGHT ATRIUM           Index LA diam:      3.20 cm 1.71 cm/m  RA Area:     12.50 cm LA Vol (A2C): 29.1 ml 15.51 ml/m RA Volume:   30.40 ml  16.20 ml/m LA Vol (A4C): 44.3 ml 23.61 ml/m  AORTIC VALVE AV Area (Vmax):    1.95 cm AV Area (Vmean):   2.03 cm AV Area (VTI):     2.06 cm AV Vmax:           131.00 cm/s AV Vmean:          89.600 cm/s AV VTI:            0.245 m AV Peak Grad:      6.9 mmHg AV Mean Grad:      4.0 mmHg LVOT Vmax:         85.50 cm/s LVOT Vmean:        61.000 cm/s LVOT VTI:          0.169 m LVOT/AV VTI ratio: 0.69  AORTA Ao Root diam: 3.15 cm Ao Asc diam:  3.40 cm MITRAL VALVE MV Area (PHT): 3.12 cm     SHUNTS MV Area VTI:   1.83 cm     Systemic VTI:  0.17 m MV Peak grad:  6.5 mmHg     Systemic Diam: 1.95 cm MV Mean grad:  2.0 mmHg MV Vmax:       1.27 m/s MV Vmean:      59.1 cm/s MV Decel Time: 243 msec MV E velocity: 85.90 cm/s MV A velocity: 120.00 cm/s MV E/A ratio:  0.72 Charolette Forward MD Electronically signed by Charolette Forward MD Signature  Date/Time: 07/21/2020/3:01:51 AM    Final    ECHO INTRAOPERATIVE TEE  Result Date: 07/21/2020  *INTRAOPERATIVE TRANSESOPHAGEAL REPORT *  Patient Name:   Kristin [name] Date of Exam: 07/21/2020 Medical Rec #:  [id number]       Height:       66.0 in Accession #:    [id number]      Weight:       172.2 lb Date of Birth:  [birth year]       BSA:          1.88 m Patient Age:    80 years        BP:           154/128 mmHg Patient Gender: F               HR:           65 bpm. Exam Location:  Anesthesiology Transesophogeal exam was perform intraoperatively during surgical procedure. Patient was closely monitored under general anesthesia during the entirety of examination. Indications:     CAD Sonographer:     Dustin Flock RDCS Performing Phys: [id number] ATKINS  Diagnosing Phys: Roberts Gaudy MD Complications: No known complications during this procedure. PRE-OP FINDINGS  Left Ventricle: The left ventricle has mildly reduced systolic function, with an ejection fraction of 45-50%. The cavity size was normal. There is no left ventricular hypertrophy. The LV cavity was normal in diameter and measured 5.1 cm at end-diastole at the mid-papillary level. There was normal LV wall thickness. There was akinesis of the distal anterior wall and distal anterior septum. The remaining LV wall segments appeared to contract adequately. The LV ejection fraction was estimated at 45-50% using the 2D Simpson's method in the four chamber and 2 chamber views. On the post-bypass exa, the LV systolic function appeared unchanged from the pre-bypass exam and the LV EF was again estimated at 45-50%. Right Ventricle: The right ventricle has normal systolic function. The cavity was normal. There is no increase in right ventricular wall thickness. On the post-bypass exam, the RV size and systolic function appared normal and unchanged from the pre-bypass exam. Left Atrium: No left atrial/left atrial appendage thrombus was detected. Right Atrium: Right atrial size was normal in size. Interatrial Septum: No atrial level shunt detected by color flow Doppler. There is no evidence of a patent foramen ovale. Pericardium: There is no evidence of pericardial effusion. Mitral Valve: The mitral valve leaflets appeared structurally normal and coapted adequately without billowing, porlapsing or flail leaflet segments. There was mild mitral regurgitation with a central jet. Tricuspid Valve: The tricuspid valve was normal in structure. Tricuspid valve regurgitation is mild by color flow Doppler. Aortic Valve: The aortic valve is tricuspid Aortic valve regurgitation is trivial by color flow Doppler. There is no stenosis of the aortic valve. Pulmonic Valve: The pulmonic valve was normal in structure, with normal leaflet  excursion. Pulmonic valve regurgitation is trivial by color flow Doppler. Aorta: The aortic root and proximal ascending aorta were normal in diameter with a well defined sino-tubular junction without effacement. There was mild intimal thickening present with no protruding atheromatous disease visualized. The descending thoracic aorta was normal in diameter and measured 2.48 cm. There was scattered Grade 2-3 atheromatous disease noted in the descending aorta. There was an intra-aortic balloon pump in the descending aorta which appeared to be 3-4 cm distal  to the aortic arch.  Pulmonary Artery: The pulmonary artery is of normal size. Shunts: There is  no evidence of an atrial septal defect. +--------------+-------++ LEFT VENTRICLE        +--------------+-------++ PLAX 2D               +--------------+-------++ LVIDd:        5.10 cm +--------------+-------++ LVIDs:                +--------------+-------++ LV PW:        0.95 cm +--------------+-------++ LV IVS:       1.00 cm +--------------+-------++ LV SV:        85 ml   +--------------+-------++ LV SV Index:  44.14   +--------------+-------++                       +--------------+-------++  +--------------+-------++ AORTA                 +--------------+-------++ Ao Sinus diam:3.18 cm +--------------+-------++ Ao STJ diam:  2.8 cm  +--------------+-------++ Ao Asc diam:  3.08 cm +--------------+-------++  Roberts Gaudy MD Electronically signed by Roberts Gaudy MD Signature Date/Time: 07/21/2020/5:37:46 PM    Final    VAS US DOPPLER PRE CABG  Result Date: 07/21/2020 PREOPERATIVE VASCULAR EVALUATION  Indications:      Pre-CABG. Risk Factors:     Hypertension, Diabetes. Limitations:      IABP Comparison Study: No prior studies. Performing Technologist: Oliver Hum RVT  Examination Guidelines: A complete evaluation includes B-mode imaging, spectral Doppler, color Doppler, and power Doppler as needed of all  accessible portions of each vessel. Bilateral testing is considered an integral part of a complete examination. Limited examinations for reoccurring indications may be performed as noted.  Right Carotid Findings: +----------+--------+--------+--------+-----------------------+--------+           PSV cm/sEDV cm/sStenosisDescribe               Comments +----------+--------+--------+--------+-----------------------+--------+ CCA Prox                          smooth and heterogenous         +----------+--------+--------+--------+-----------------------+--------+ CCA Distal                        smooth and heterogenous         +----------+--------+--------+--------+-----------------------+--------+ ICA Prox                          smooth and heterogenous         +----------+--------+--------+--------+-----------------------+--------+ Portions of this table do not appear on this page. +---------+--------+--------+---------+ VertebralPSV cm/sEDV cm/sAntegrade +---------+--------+--------+---------+ Waveforms unreliable due to the presence of an IABP. Velocities not recorded. Left Carotid Findings: +----------+--------+--------+--------+-----------------------+--------+           PSV cm/sEDV cm/sStenosisDescribe               Comments +----------+--------+--------+--------+-----------------------+--------+ CCA Prox                          smooth and heterogenous         +----------+--------+--------+--------+-----------------------+--------+ CCA Distal                        smooth and heterogenous         +----------+--------+--------+--------+-----------------------+--------+ ICA Prox                          smooth and heterogenous         +----------+--------+--------+--------+-----------------------+--------+  ICA Distal                                               tortuous +----------+--------+--------+--------+-----------------------+--------+  +---------+--------+--------+---------+ VertebralPSV cm/sEDV cm/sAntegrade +---------+--------+--------+---------+ Waveforms unreliable due to the presence of an IABP. Velocities not recorded.  Summary: Right Carotid: There is no evidence of hemodynamically significant plaque                formation. Left Carotid: There is no evidence of hemodynamically significant plaque               formation. Vertebrals: Bilateral vertebral arteries demonstrate antegrade flow.  Electronically signed by Monica Martinez MD on 07/21/2020 at 1:04:50 PM.    Final     Cardiac Studies:  Assessment/Plan:  Acute non-Q wave myocardial infarction status post left cardiac catheterization noted to have three-vessel coronary artery disease status post CABG x5 complicated postoperatively by cardiac tamponade requiring evacuation of the hematoma Vent dependent hypoxic respiratory failure Hypertension Diabetes mellitus Postop anemia Acute on chronic kidney injury Morbid obesity Plan Continue present management per CVTS   LOS: 2 days    Charolette Forward 07/22/2020, 12:01 PM

## 2020-07-22 NOTE — CV Procedure (Signed)
    TRANSESOPHAGEAL ECHOCARDIOGRAM   NAME:  Kristin [name]   MRN: [id number] DOB:  Mar 14, 1941   ADMIT DATE: 07/20/2020  INDICATIONS:  Cardiogenic shock  PROCEDURE:   Emergent consent. Patient intubated and sedated. The transesophageal probe was inserted in the esophagus and stomach without difficulty and multiple views were obtained.    COMPLICATIONS:    There were no immediate complications.  FINDINGS:  LEFT VENTRICLE: EF = 40-45%.The LV has marked LVH and is underfilled.There is external compression of the LV from a fluid collection posterior an lateral to the heart.   RIGHT VENTRICLE: Moderate HK + swan  LEFT ATRIUM: Dilated. Mild external compression   LEFT ATRIAL APPENDAGE: Not visualized  RIGHT ATRIUM: Dialted  AORTIC VALVE:  Trileaflet. No AI/AS  MITRAL VALVE:    Normal. Trivial MR  TRICUSPID VALVE: Normal. Trivial TR  PULMONIC VALVE: Normal  INTERATRIAL SEPTUM: Not well visualized  PERICARDIUM: Large echodense collection posterior and lateral to heart.   DESCENDING AORTA: Not visualized    [name] 12:24 AM

## 2020-07-22 NOTE — Brief Op Note (Signed)
07/22/2020  3:37 AM  PATIENT:  Kristin [name]  80 y.o. female  PRE-OPERATIVE DIAGNOSIS:  s/p CABG, tamponade  POST-OPERATIVE DIAGNOSIS:  s/p CABG, tamponade  PROCEDURE:  Procedure(s): MEDIASTINAL EXPLORATION (N/A) TRANSESOPHAGEAL ECHOCARDIOGRAM (TEE) (N/A)  SURGEON:  Surgeon(s) and Role:    * [name], MD - Primary  PHYSICIAN ASSISTANT:   ASSISTANTS: staff   ANESTHESIA:   general  EBL:  Per anes  BLOOD ADMINISTERED: per anes record  DRAINS: 3 Chest Tube(s) in the mediastinum and left pleural space   LOCAL MEDICATIONS USED:  NONE  SPECIMEN:  No Specimen  DISPOSITION OF SPECIMEN:  N/A  COUNTS:  YES  TOURNIQUET:  * No tourniquets in log *  DICTATION: .Note written in EPIC  PLAN OF CARE: Admit to inpatient   PATIENT DISPOSITION:  ICU - intubated and critically ill.   Delay start of Pharmacological VTE agent (>24hrs) due to surgical blood loss or risk of bleeding: yes

## 2020-07-23 DIAGNOSIS — I214 Non-ST elevation (NSTEMI) myocardial infarction: Secondary | ICD-10-CM | POA: Diagnosis not present

## 2020-07-23 DIAGNOSIS — R570 Cardiogenic shock: Secondary | ICD-10-CM | POA: Diagnosis not present

## 2020-07-23 LAB — CBC
HCT: 28 % — ABNORMAL LOW (ref 36.0–46.0)
Hemoglobin: 9.6 g/dL — ABNORMAL LOW (ref 12.0–15.0)
MCH: 31.1 pg (ref 26.0–34.0)
MCHC: 34.3 g/dL (ref 30.0–36.0)
MCV: 90.6 fL (ref 80.0–100.0)
Platelets: 47 10*3/uL — ABNORMAL LOW (ref 150–400)
RBC: 3.09 MIL/uL — ABNORMAL LOW (ref 3.87–5.11)
RDW: 17 % — ABNORMAL HIGH (ref 11.5–15.5)
WBC: 9.7 10*3/uL (ref 4.0–10.5)
nRBC: 1.6 % — ABNORMAL HIGH (ref 0.0–0.2)

## 2020-07-23 LAB — GLUCOSE, CAPILLARY
Glucose-Capillary: 124 mg/dL — ABNORMAL HIGH (ref 70–99)
Glucose-Capillary: 135 mg/dL — ABNORMAL HIGH (ref 70–99)
Glucose-Capillary: 140 mg/dL — ABNORMAL HIGH (ref 70–99)
Glucose-Capillary: 137 mg/dL — ABNORMAL HIGH (ref 70–99)
Glucose-Capillary: 122 mg/dL — ABNORMAL HIGH (ref 70–99)
Glucose-Capillary: 142 mg/dL — ABNORMAL HIGH (ref 70–99)
Glucose-Capillary: 126 mg/dL — ABNORMAL HIGH (ref 70–99)
Glucose-Capillary: 94 mg/dL (ref 70–99)
Glucose-Capillary: 147 mg/dL — ABNORMAL HIGH (ref 70–99)
Glucose-Capillary: 131 mg/dL — ABNORMAL HIGH (ref 70–99)
Glucose-Capillary: 133 mg/dL — ABNORMAL HIGH (ref 70–99)
Glucose-Capillary: 135 mg/dL — ABNORMAL HIGH (ref 70–99)
Glucose-Capillary: 137 mg/dL — ABNORMAL HIGH (ref 70–99)
Glucose-Capillary: 139 mg/dL — ABNORMAL HIGH (ref 70–99)
Glucose-Capillary: 140 mg/dL — ABNORMAL HIGH (ref 70–99)
Glucose-Capillary: 141 mg/dL — ABNORMAL HIGH (ref 70–99)
Glucose-Capillary: 141 mg/dL — ABNORMAL HIGH (ref 70–99)
Glucose-Capillary: 143 mg/dL — ABNORMAL HIGH (ref 70–99)
Glucose-Capillary: 150 mg/dL — ABNORMAL HIGH (ref 70–99)
Glucose-Capillary: 155 mg/dL — ABNORMAL HIGH (ref 70–99)
Glucose-Capillary: 162 mg/dL — ABNORMAL HIGH (ref 70–99)
Glucose-Capillary: 138 mg/dL — ABNORMAL HIGH (ref 70–99)

## 2020-07-23 LAB — POCT I-STAT 7, (LYTES, BLD GAS, ICA,H+H)
Acid-Base Excess: 5 mmol/L — ABNORMAL HIGH (ref 0.0–2.0)
Acid-base deficit: 8 mmol/L — ABNORMAL HIGH (ref 0.0–2.0)
Bicarbonate: 19 mmol/L — ABNORMAL LOW (ref 20.0–28.0)
Bicarbonate: 29.2 mmol/L — ABNORMAL HIGH (ref 20.0–28.0)
Calcium, Ion: 0.97 mmol/L — ABNORMAL LOW (ref 1.15–1.40)
Calcium, Ion: 1.16 mmol/L (ref 1.15–1.40)
HCT: 22 % — ABNORMAL LOW (ref 36.0–46.0)
HCT: 25 % — ABNORMAL LOW (ref 36.0–46.0)
Hemoglobin: 7.5 g/dL — ABNORMAL LOW (ref 12.0–15.0)
Hemoglobin: 8.5 g/dL — ABNORMAL LOW (ref 12.0–15.0)
O2 Saturation: 98 %
O2 Saturation: 94 %
Patient temperature: 36
Patient temperature: 36.6
Potassium: 3.9 mmol/L (ref 3.5–5.1)
Potassium: 3.4 mmol/L — ABNORMAL LOW (ref 3.5–5.1)
Sodium: 152 mmol/L — ABNORMAL HIGH (ref 135–145)
Sodium: 152 mmol/L — ABNORMAL HIGH (ref 135–145)
TCO2: 20 mmol/L — ABNORMAL LOW (ref 22–32)
TCO2: 31 mmol/L (ref 22–32)
pCO2 arterial: 43.5 mmHg (ref 32.0–48.0)
pCO2 arterial: 42.7 mmHg (ref 32.0–48.0)
pH, Arterial: 7.244 — ABNORMAL LOW (ref 7.350–7.450)
pH, Arterial: 7.442 (ref 7.350–7.450)
pO2, Arterial: 112 mmHg — ABNORMAL HIGH (ref 83.0–108.0)
pO2, Arterial: 68 mmHg — ABNORMAL LOW (ref 83.0–108.0)

## 2020-07-23 LAB — COOXEMETRY PANEL
Carboxyhemoglobin: 0.7 % (ref 0.5–1.5)
Methemoglobin: 0.9 % (ref 0.0–1.5)
O2 Saturation: 69.4 %
Total hemoglobin: 9.8 g/dL — ABNORMAL LOW (ref 12.0–16.0)

## 2020-07-23 LAB — BASIC METABOLIC PANEL
Anion gap: 10 (ref 5–15)
BUN: 28 mg/dL — ABNORMAL HIGH (ref 8–23)
CO2: 27 mmol/L (ref 22–32)
Calcium: 8.3 mg/dL — ABNORMAL LOW (ref 8.9–10.3)
Chloride: 113 mmol/L — ABNORMAL HIGH (ref 98–111)
Creatinine, Ser: 1.9 mg/dL — ABNORMAL HIGH (ref 0.44–1.00)
GFR, Estimated: 27 mL/min — ABNORMAL LOW (ref >60)
Glucose, Bld: 139 mg/dL — ABNORMAL HIGH (ref 70–99)
Potassium: 3.4 mmol/L — ABNORMAL LOW (ref 3.5–5.1)
Sodium: 150 mmol/L — ABNORMAL HIGH (ref 135–145)

## 2020-07-23 MED ORDER — INSULIN DETEMIR 100 UNIT/ML ~~LOC~~ SOLN
10.0000 [IU] | Freq: Once | SUBCUTANEOUS | Status: AC
  Administered 2020-07-23: 10 [IU] via SUBCUTANEOUS
  Filled 2020-07-23: qty 0.1

## 2020-07-23 MED ORDER — PANTOPRAZOLE SODIUM 40 MG PO TBEC
40.0000 mg | DELAYED_RELEASE_TABLET | Freq: Every day | ORAL | Status: DC
  Administered 2020-07-23 – 2020-07-25 (×3): 40 mg via ORAL
  Filled 2020-07-23 (×3): qty 1

## 2020-07-23 MED ORDER — QUETIAPINE FUMARATE ER 50 MG PO TB24
50.0000 mg | ORAL_TABLET | Freq: Every day | ORAL | Status: DC
  Administered 2020-07-23 – 2020-07-24 (×2): 50 mg via ORAL
  Filled 2020-07-23 (×3): qty 1

## 2020-07-23 MED ORDER — ATORVASTATIN CALCIUM 80 MG PO TABS
80.0000 mg | ORAL_TABLET | Freq: Every day | ORAL | Status: DC
  Administered 2020-07-23 – 2020-07-24 (×2): 80 mg via ORAL
  Filled 2020-07-23 (×2): qty 1

## 2020-07-23 MED ORDER — OXYCODONE HCL 5 MG PO TABS: 5.0000 mg | ORAL_TABLET | ORAL | Status: DC | PRN

## 2020-07-23 MED ORDER — POTASSIUM CHLORIDE 10 MEQ/50ML IV SOLN
10.0000 meq | INTRAVENOUS | Status: AC
  Administered 2020-07-23 (×3): 10 meq via INTRAVENOUS
  Filled 2020-07-23 (×3): qty 50

## 2020-07-23 MED ORDER — FUROSEMIDE 10 MG/ML IJ SOLN
40.0000 mg | Freq: Two times a day (BID) | INTRAMUSCULAR | Status: DC
  Administered 2020-07-23 – 2020-07-24 (×4): 40 mg via INTRAVENOUS
  Filled 2020-07-23 (×4): qty 4

## 2020-07-23 MED ORDER — INSULIN ASPART 100 UNIT/ML ~~LOC~~ SOLN
0.0000 [IU] | SUBCUTANEOUS | Status: DC
  Administered 2020-07-23 (×3): 2 [IU] via SUBCUTANEOUS
  Administered 2020-07-24: 4 [IU] via SUBCUTANEOUS
  Administered 2020-07-24 – 2020-07-25 (×4): 2 [IU] via SUBCUTANEOUS
  Administered 2020-07-25: 4 [IU] via SUBCUTANEOUS
  Administered 2020-07-25: 2 [IU] via SUBCUTANEOUS
  Administered 2020-07-26: 4 [IU] via SUBCUTANEOUS
  Administered 2020-07-26 (×2): 8 [IU] via SUBCUTANEOUS
  Administered 2020-07-26: 16 [IU] via SUBCUTANEOUS
  Administered 2020-07-26: 12 [IU] via SUBCUTANEOUS
  Administered 2020-07-26: 2 [IU] via SUBCUTANEOUS
  Administered 2020-07-27: 8 [IU] via SUBCUTANEOUS
  Administered 2020-07-27: 2 [IU] via SUBCUTANEOUS
  Administered 2020-07-27: 12 [IU] via SUBCUTANEOUS
  Administered 2020-07-27: 2 [IU] via SUBCUTANEOUS
  Administered 2020-07-28: 4 [IU] via SUBCUTANEOUS
  Administered 2020-07-28 (×2): 8 [IU] via SUBCUTANEOUS
  Administered 2020-07-28: 16 [IU] via SUBCUTANEOUS
  Administered 2020-07-28: 4 [IU] via SUBCUTANEOUS
  Administered 2020-07-28 – 2020-07-29 (×3): 2 [IU] via SUBCUTANEOUS

## 2020-07-23 MED ORDER — INSULIN DETEMIR 100 UNIT/ML ~~LOC~~ SOLN
10.0000 [IU] | Freq: Every day | SUBCUTANEOUS | Status: DC
  Administered 2020-07-24 – 2020-07-30 (×6): 10 [IU] via SUBCUTANEOUS
  Filled 2020-07-23 (×8): qty 0.1

## 2020-07-23 MED ORDER — ORAL CARE MOUTH RINSE
15.0000 mL | Freq: Two times a day (BID) | OROMUCOSAL | Status: DC
  Administered 2020-07-23 – 2020-08-09 (×27): 15 mL via OROMUCOSAL

## 2020-07-23 MED ORDER — ACETAMINOPHEN 500 MG PO TABS
1000.0000 mg | ORAL_TABLET | Freq: Four times a day (QID) | ORAL | Status: DC
  Administered 2020-07-23 – 2020-07-25 (×8): 1000 mg via ORAL
  Filled 2020-07-23 (×8): qty 2

## 2020-07-23 MED ORDER — ENOXAPARIN SODIUM 30 MG/0.3ML ~~LOC~~ SOLN
30.0000 mg | Freq: Every day | SUBCUTANEOUS | Status: DC
  Administered 2020-07-23 – 2020-08-05 (×14): 30 mg via SUBCUTANEOUS
  Filled 2020-07-23 (×14): qty 0.3

## 2020-07-23 MED ORDER — TRAMADOL HCL 50 MG PO TABS
50.0000 mg | ORAL_TABLET | ORAL | Status: DC | PRN
  Administered 2020-07-28: 50 mg via ORAL
  Administered 2020-08-02: 100 mg via ORAL
  Filled 2020-07-23: qty 1
  Filled 2020-07-23 (×2): qty 2

## 2020-07-23 MED ORDER — ACETAMINOPHEN 160 MG/5ML PO SOLN: 1000.0000 mg | Freq: Four times a day (QID) | ORAL | Status: DC

## 2020-07-23 MED ORDER — DOCUSATE SODIUM 100 MG PO CAPS
200.0000 mg | ORAL_CAPSULE | Freq: Every day | ORAL | Status: DC
Start: 2020-07-23 — End: 2020-07-25
  Administered 2020-07-23 – 2020-07-25 (×2): 200 mg via ORAL
  Filled 2020-07-23 (×3): qty 2

## 2020-07-23 NOTE — Progress Notes (Signed)
Patient increasingly anxious and screaming despite redirection and reassurance. Dex restarted at .3, Dr. Kipp Brood made aware.

## 2020-07-23 NOTE — Progress Notes (Signed)
Patient ID: Kristin [name], female   DOB: 07-17-1940, 80 y.o.   MRN: [id number]    Cardiologist: Terrence Dupont  Advanced Heart Failure Rounding Note   Subjective:    - 3/24 NSTEMI - 3/24 Emergent CABG x 5 (LIMA to LAD, SEQ SVG to OM and Diagonal, SEQ to PL and PDA) - 3/25 take back to OR for tampnade  Extubated yesterday, awake/alert.  She is on epinephrine 3, milrinone 0.125, vasopressin 0.02.  Pulled out swan by accident.  Na remains 150 today but just passed swallow evaluation and starting to drink fluid.     Platelets continue to fall, 47K today.   Creatinine 1.1 -> 1.4 -> 1.7 -> 1.9  Last Swan #s  CVP 12 PA 25/7 Thermo CI 2.5 Co-ox 69%  EF on TEE 55%, RV mildly down (3/25)  Objective:   Weight Range:  Vital Signs:   Temp:  [97.88 F (36.6 C)-99.32 F (37.4 C)] 97.88 F (36.6 C) (03/26 0600) Pulse Rate:  [29-90] 89 (03/26 0600) Resp:  [15-28] 15 (03/26 0600) BP: (64-149)/(50-102) 118/66 (03/26 0600) SpO2:  [96 %-100 %] 97 % (03/26 0600) Arterial Line BP: (74-146)/(31-69) 123/54 (03/26 0600) FiO2 (%):  [40 %-50 %] 40 % (03/26 0410) Last BM Date: 07/19/20  Weight change: Filed Weights   07/20/20 1420 07/21/20 0601  Weight: 78.1 kg 78.1 kg    Intake/Output:   Intake/Output Summary (Last 24 hours) at 07/23/2020 0815 Last data filed at 07/23/2020 0700 Gross per 24 hour  Intake 1771.14 ml  Output 1420 ml  Net 351.14 ml      Physical Exam: General: NAD Neck: JVP 12+ cm, no thyromegaly or thyroid nodule.  Lungs: Decreased at bases.  CV: Nondisplaced PMI.  Heart regular S1/S2, no S3/S4, no murmur.  1+ ankle edema.  Abdomen: Soft, nontender, no hepatosplenomegaly, no distention.  Skin: Intact without lesions or rashes.  Neurologic: Alert and oriented x 3.  Psych: Normal affect. Extremities: No clubbing or cyanosis.  HEENT: Normal.   Telemetry: AV paced 70 Personally reviewed   Labs: Basic Metabolic Panel: Recent Labs  Lab  0000 07/20/20 1538  07/21/20 0442 07/21/20 1125 07/21/20 1452 07/21/20 1455 07/21/20 2105 07/21/20 2111 07/22/20 0404 07/22/20 0422 07/22/20 0814 07/22/20 1700 07/23/20 0300 07/23/20 0434  NA  --   --  139   < > 144   < > 144   < > 150* 150* 150* 150* 150* 152*  K  --   --  3.9   < > 4.5   < > 4.0   < > 4.0 4.1 3.4* 3.6 3.4* 3.4*  CL  --   --  112*   < > 110  --  116*  --  111  --   --  114* 113*  --   CO2  --   --  21*  --   --   --  17*  --  20*  --   --  27 27  --   GLUCOSE  --   --  199*   < > 119*  --  103*  --  124*  --   --  148* 139*  --   BUN  --   --  26*   < > 23  --  20  --  19  --   --  24* 28*  --   CREATININE  --   --  1.57*   < > 1.10*  --  1.43*  --  1.70*  --   --  1.91* 1.90*  --   CALCIUM   < >  --  8.8*  --   --   --  7.6*  --  7.5*  --   --  8.3* 8.3*  --   MG  --  1.9  --   --   --   --   --   --  2.5*  --   --  2.4  --   --    < > = values in this interval not displayed.    Liver Function Tests: No results for input(s): AST, ALT, ALKPHOS, BILITOT, PROT, ALBUMIN in the last 168 hours. No results for input(s): LIPASE, AMYLASE in the last 168 hours. No results for input(s): AMMONIA in the last 168 hours.  CBC: Recent Labs  Lab 07/21/20 1602 07/21/20 2105 07/21/20 2111 07/22/20 0404 07/22/20 0422 07/22/20 0814 07/22/20 1700 07/23/20 0300 07/23/20 0434  WBC 6.0 10.5  --  8.6  --   --  9.3 9.7  --   HGB 7.1* 9.6*   < > 11.5* 10.9* 11.6* 10.9* 9.6* 8.5*  HCT 21.0* 29.2*   < > 35.0* 32.0* 34.0* 31.1* 28.0* 25.0*  MCV 101.0* 99.0  --  95.1  --   --  88.1 90.6  --   PLT 116* 134*  --  70*  --   --  52* 47*  --    < > = values in this interval not displayed.    Cardiac Enzymes: No results for input(s): CKTOTAL, CKMB, CKMBINDEX, TROPONINI in the last 168 hours.  BNP: BNP (last 3 results) No results for input(s): BNP in the last 8760 hours.  ProBNP (last 3 results) No results for input(s): PROBNP in the last 8760 hours.    Other results:  Imaging: CT HEAD WO  CONTRAST  Addendum Date: 07/21/2020   ADDENDUM REPORT: 07/21/2020 10:39 ADDENDUM: Mild cerebral atrophy. Electronically Signed   By: Donzetta Kohut M.D.   On: 07/21/2020 10:39   Result Date: 07/21/2020 CLINICAL DATA:  Cerebral atherosclerosis, preop planning for CABG. EXAM: CT HEAD WITHOUT CONTRAST TECHNIQUE: Contiguous axial images were obtained from the base of the skull through the vertex without intravenous contrast. COMPARISON:  February 08, 2012 FINDINGS: Brain: No evidence of acute infarction, hemorrhage, hydrocephalus, extra-axial collection or mass lesion/mass effect. Vascular: No hyperdense vessel or unexpected calcification. Signs of intracranial atherosclerosis involving carotid and basilar arteries. Skull: Normal. Negative for fracture or focal lesion. Sinuses/Orbits: No acute finding. Other: None IMPRESSION: 1. No acute intracranial abnormality. 2. Signs of intracranial atherosclerosis involving carotid and basilar arteries. Electronically Signed: By: Donzetta Kohut M.D. On: 07/21/2020 10:25   CT CHEST WO CONTRAST  Result Date: 07/21/2020 CLINICAL DATA:  Thoracic aortic disease. EXAM: CT CHEST WITHOUT CONTRAST TECHNIQUE: Multidetector CT imaging of the chest was performed following the standard protocol without IV contrast. COMPARISON:  January 20, 2020. FINDINGS: Cardiovascular: Atherosclerosis of thoracic aorta is noted without aneurysm formation. Normal cardiac size. No pericardial effusion. Extensive coronary artery calcifications are noted. Intra-aortic balloon is noted in the distal portion of the descending thoracic aorta. Mediastinum/Nodes: No enlarged mediastinal or axillary lymph nodes. Thyroid gland, trachea, and esophagus demonstrate no significant findings. Lungs/Pleura: No pneumothorax or pleural effusion is noted. Minimal bilateral posterior basilar subsegmental atelectasis or scarring is noted. Upper Abdomen: No acute abnormality. Musculoskeletal: No chest wall mass or  suspicious bone lesions identified. IMPRESSION: 1. Intra-aortic balloon is noted in the distal portion of  the descending thoracic aorta. 2. Extensive coronary artery calcifications are noted suggesting coronary artery disease. 3. Minimal bilateral posterior basilar subsegmental atelectasis or scarring is noted. 4. Aortic atherosclerosis. Aortic Atherosclerosis (ICD10-I70.0). Electronically Signed   By: Marijo Conception M.D.   On: 07/21/2020 10:39   DG Chest Port 1 View  Result Date: 07/22/2020 CLINICAL DATA:  Status post CABG X 5 EXAM: PORTABLE CHEST 1 VIEW COMPARISON:  Radiograph 07/21/2020 FINDINGS: Endotracheal tube tip terminates in the mid trachea 5 cm from the carina. Right IJ catheter sheath remains in place with Swan-Ganz catheter terminating near the pulmonary trunk. A transesophageal tube appears curled within the distal thoracic esophagus, redirected superiorly. Recommend repositioning. Mediastinal and left pleural drains in place. Epicardial pacer wires are seen. Telemetry leads and additional external support devices overlie the chest. Stable postoperative cardiomediastinal contours. Some residual hazy opacities in the lungs may reflect a combination of atelectasis and edema with trace residual left effusion. No visible pneumothorax. Linear lucency extending from the lower mediastinum across the upper abdomen may be related to skin fold. No other acute osseous or soft tissue abnormality. Degenerative changes are present in the imaged spine and shoulders. IMPRESSION: 1. Lines and tubes as described above. 2. A transesophageal tube appears curled within the distal thoracic esophagus, redirected superiorly. Recommend repositioning. 3. Linear lucency extending from the lower mediastinum across the upper abdomen may be related to skin fold, could consider repositioning and reimaging. 4. Otherwise satisfactory postoperative appearance of the chest. Electronically Signed   By: Lovena Le M.D.   On:  07/22/2020 04:31   DG CHEST PORT 1 VIEW  Result Date: 07/21/2020 CLINICAL DATA:  Cardiorespiratory failure EXAM: PORTABLE CHEST 1 VIEW COMPARISON:  07/21/2020 FINDINGS: Changes of CABG. Support devices including left chest tubes are stable. No pneumothorax. Left base atelectasis with small bilateral effusions. IMPRESSION: Postoperative changes. Small bilateral effusions with left base atelectasis. No pneumothorax. Electronically Signed   By: Rolm Baptise M.D.   On: 07/21/2020 22:15   DG Chest Port 1 View  Result Date: 07/21/2020 CLINICAL DATA:  Status post coronary bypass grafting EXAM: PORTABLE CHEST 1 VIEW COMPARISON:  CT from earlier in the same day. FINDINGS: Cardiac shadow is enlarged. Postsurgical changes are now seen. Endotracheal tube and gastric catheter are noted in satisfactory position. Improved placement of intra-aortic balloon pump is noted just below the aortic knob. Swan-Ganz catheter is noted in the pulmonary outflow tract. Mediastinal and left thoracostomy catheters are seen. No pneumothorax is noted. No focal infiltrate is seen. IMPRESSION: Postsurgical changes with tubes and lines as described above. No acute abnormality noted. Electronically Signed   By: Inez Catalina M.D.   On: 07/21/2020 16:47   ECHO INTRAOPERATIVE TEE  Result Date: 07/21/2020  *INTRAOPERATIVE TRANSESOPHAGEAL REPORT *  Patient Name:   SONYA GUNNOE Date of Exam: 07/21/2020 Medical Rec #:  [id number]       Height:       66.0 in Accession #:    [id number]      Weight:       172.2 lb Date of Birth:  Dec 30, 1940       BSA:          1.88 m Patient Age:    70 years        BP:           154/128 mmHg Patient Gender: F               HR:  65 bpm. Exam Location:  Anesthesiology Transesophogeal exam was perform intraoperatively during surgical procedure. Patient was closely monitored under general anesthesia during the entirety of examination. Indications:     CAD Sonographer:     Dustin Flock RDCS Performing Phys:  [id number] ZMOQHUT Z ATKINS Diagnosing Phys: Roberts Gaudy MD Complications: No known complications during this procedure. PRE-OP FINDINGS  Left Ventricle: The left ventricle has mildly reduced systolic function, with an ejection fraction of 45-50%. The cavity size was normal. There is no left ventricular hypertrophy. The LV cavity was normal in diameter and measured 5.1 cm at end-diastole at the mid-papillary level. There was normal LV wall thickness. There was akinesis of the distal anterior wall and distal anterior septum. The remaining LV wall segments appeared to contract adequately. The LV ejection fraction was estimated at 45-50% using the 2D Simpson's method in the four chamber and 2 chamber views. On the post-bypass exa, the LV systolic function appeared unchanged from the pre-bypass exam and the LV EF was again estimated at 45-50%. Right Ventricle: The right ventricle has normal systolic function. The cavity was normal. There is no increase in right ventricular wall thickness. On the post-bypass exam, the RV size and systolic function appared normal and unchanged from the pre-bypass exam. Left Atrium: No left atrial/left atrial appendage thrombus was detected. Right Atrium: Right atrial size was normal in size. Interatrial Septum: No atrial level shunt detected by color flow Doppler. There is no evidence of a patent foramen ovale. Pericardium: There is no evidence of pericardial effusion. Mitral Valve: The mitral valve leaflets appeared structurally normal and coapted adequately without billowing, porlapsing or flail leaflet segments. There was mild mitral regurgitation with a central jet. Tricuspid Valve: The tricuspid valve was normal in structure. Tricuspid valve regurgitation is mild by color flow Doppler. Aortic Valve: The aortic valve is tricuspid Aortic valve regurgitation is trivial by color flow Doppler. There is no stenosis of the aortic valve. Pulmonic Valve: The pulmonic valve was normal in  structure, with normal leaflet excursion. Pulmonic valve regurgitation is trivial by color flow Doppler. Aorta: The aortic root and proximal ascending aorta were normal in diameter with a well defined sino-tubular junction without effacement. There was mild intimal thickening present with no protruding atheromatous disease visualized. The descending thoracic aorta was normal in diameter and measured 2.48 cm. There was scattered Grade 2-3 atheromatous disease noted in the descending aorta. There was an intra-aortic balloon pump in the descending aorta which appeared to be 3-4 cm distal  to the aortic arch.  Pulmonary Artery: The pulmonary artery is of normal size. Shunts: There is no evidence of an atrial septal defect. +--------------+-------++ LEFT VENTRICLE        +--------------+-------++ PLAX 2D               +--------------+-------++ LVIDd:        5.10 cm +--------------+-------++ LVIDs:                +--------------+-------++ LV PW:        0.95 cm +--------------+-------++ LV IVS:       1.00 cm +--------------+-------++ LV SV:        85 ml   +--------------+-------++ LV SV Index:  44.14   +--------------+-------++                       +--------------+-------++  +--------------+-------++ AORTA                 +--------------+-------++ Ao  Sinus diam:3.18 cm +--------------+-------++ Ao STJ diam:  2.8 cm  +--------------+-------++ Ao Asc diam:  3.08 cm +--------------+-------++  Kipp Brood MD Electronically signed by Kipp Brood MD Signature Date/Time: 07/21/2020/5:37:46 PM    Final    VAS US DOPPLER PRE CABG  Result Date: 07/21/2020 PREOPERATIVE VASCULAR EVALUATION  Indications:      Pre-CABG. Risk Factors:     Hypertension, Diabetes. Limitations:      IABP Comparison Study: No prior studies. Performing Technologist: Chanda Busing RVT  Examination Guidelines: A complete evaluation includes B-mode imaging, spectral Doppler, color Doppler, and power  Doppler as needed of all accessible portions of each vessel. Bilateral testing is considered an integral part of a complete examination. Limited examinations for reoccurring indications may be performed as noted.  Right Carotid Findings: +----------+--------+--------+--------+-----------------------+--------+           PSV cm/sEDV cm/sStenosisDescribe               Comments +----------+--------+--------+--------+-----------------------+--------+ CCA Prox                          smooth and heterogenous         +----------+--------+--------+--------+-----------------------+--------+ CCA Distal                        smooth and heterogenous         +----------+--------+--------+--------+-----------------------+--------+ ICA Prox                          smooth and heterogenous         +----------+--------+--------+--------+-----------------------+--------+ Portions of this table do not appear on this page. +---------+--------+--------+---------+ VertebralPSV cm/sEDV cm/sAntegrade +---------+--------+--------+---------+ Waveforms unreliable due to the presence of an IABP. Velocities not recorded. Left Carotid Findings: +----------+--------+--------+--------+-----------------------+--------+           PSV cm/sEDV cm/sStenosisDescribe               Comments +----------+--------+--------+--------+-----------------------+--------+ CCA Prox                          smooth and heterogenous         +----------+--------+--------+--------+-----------------------+--------+ CCA Distal                        smooth and heterogenous         +----------+--------+--------+--------+-----------------------+--------+ ICA Prox                          smooth and heterogenous         +----------+--------+--------+--------+-----------------------+--------+ ICA Distal                                               tortuous  +----------+--------+--------+--------+-----------------------+--------+ +---------+--------+--------+---------+ VertebralPSV cm/sEDV cm/sAntegrade +---------+--------+--------+---------+ Waveforms unreliable due to the presence of an IABP. Velocities not recorded.  Summary: Right Carotid: There is no evidence of hemodynamically significant plaque                formation. Left Carotid: There is no evidence of hemodynamically significant plaque               formation. Vertebrals: Bilateral vertebral arteries demonstrate antegrade flow.  Electronically signed by Sherald Hess MD on 07/21/2020 at 1:04:50 PM.    Final  Medications:     Scheduled Medications: . sodium chloride   Intravenous Once  . sodium chloride   Intravenous Once  . sodium chloride   Intravenous Once  . acetaminophen  1,000 mg Oral Q6H   Or  . acetaminophen (TYLENOL) oral liquid 160 mg/5 mL  1,000 mg Per Tube Q6H  . aspirin EC  325 mg Oral Daily   Or  . aspirin  324 mg Per Tube Daily  . atorvastatin  80 mg Per Tube q1800  . bisacodyl  10 mg Oral Daily   Or  . bisacodyl  10 mg Rectal Daily  . chlorhexidine gluconate (MEDLINE KIT)  15 mL Mouth Rinse BID  . Chlorhexidine Gluconate Cloth  6 each Topical Daily  . docusate  200 mg Per Tube Daily  . furosemide  40 mg Intravenous BID  . mouth rinse  15 mL Mouth Rinse 10 times per day  . pantoprazole sodium  40 mg Per Tube Daily  . sodium chloride flush  3 mL Intravenous Q12H    Infusions: . sodium chloride Stopped (07/22/20 2016)  . sodium chloride    . sodium chloride 10 mL/hr (07/21/20 1545)  . albumin human    . cefUROXime (ZINACEF)  IV Stopped (07/22/20 2344)  . dexmedetomidine (PRECEDEX) IV infusion Stopped (07/23/20 0542)  . electrolyte-A Stopped (07/22/20 0030)  . epinephrine 3 mcg/min (07/23/20 0215)  . insulin 2.2 mL/hr at 07/23/20 0700  . lactated ringers    . lactated ringers    . lactated ringers 20 mL/hr at 07/23/20 0700  . milrinone 0.125  mcg/kg/min (07/23/20 0700)  . nitroGLYCERIN 0 mcg/min (07/21/20 1900)  . norepinephrine (LEVOPHED) Adult infusion Stopped (07/22/20 0405)  . phenylephrine (NEO-SYNEPHRINE) Adult infusion Stopped (07/22/20 0149)  . potassium chloride 10 mEq (07/23/20 0754)  . sodium bicarbonate (isotonic) 150 mEq in D5W 1000 mL infusion Stopped (07/22/20 0112)  . vasopressin 0.02 Units/min (07/23/20 0700)    PRN Medications: sodium chloride, dextrose, lactated ringers, metoprolol tartrate, morphine injection, ondansetron (ZOFRAN) IV, oxyCODONE, sodium chloride flush, traMADol   Assessment/Plan:   1. Cardiogenic shock - s/p CABG 3/24 with take back for tamponade on 3/25 - LVEF 55% on post-op TEE 3/25 - Remains on epi 3, milrinone 0.125, and VP 0.02.   - CI 2.5 on Swan (pulled by accident) and co-ox 69%.  MAP stable, think we can stop vasopressin.   - CVP elevated/volume overloaded => start Lasix 40 mg IV bid.  - Follow CVP/co-ox off central line.   2. CAD s/p NSTEMI  - CABG x 5 3/24 - no evidence of ongoing ischemia - continue ASA, statin. Stop b-blocker with shock - will need DAPT prior to d/c  3. Acute hypoxic respiratory failure post-op - Extubated.  - Diurese today.   4. AKI - baseline creatinine 1.1 -> 1.5 -> 1.7 -> 1.9 - due to shock/ATN - continue hemodynamic support   5. Acute blood loss anemia, post -op - transfuse to keep hgb >= 7.5  6. Hypernatremia - Stable Na 150, now able to take po and should correct.   7. Low platelets - Steady fall post-op, suspect inflammatory and doubt HIT.  Follow closely.    CRITICAL CARE Performed by: Loralie Champagne  Total critical care time: 40 minutes  Critical care time was exclusive of separately billable procedures and treating other patients.  Critical care was necessary to treat or prevent imminent or life-threatening deterioration.  Critical care was time spent personally by me on the following  activities: development of treatment  plan with patient and/or surrogate as well as nursing, discussions with consultants, evaluation of patient's response to treatment, examination of patient, obtaining history from patient or surrogate, ordering and performing treatments and interventions, ordering and review of laboratory studies, ordering and review of radiographic studies, pulse oximetry and re-evaluation of patient's condition.   Length of Stay: 3   Loralie Champagne MD 07/23/2020, 8:15 AM  Advanced Heart Failure Team Pager [phone] (M-F; 7a - 4p)  Please contact [facility] for night-coverage after hours (4p -7a ) and weekends on [email or web address]

## 2020-07-23 NOTE — Progress Notes (Addendum)
      [address]       [place] [address]             [phone]                 1 Day Post-Op Procedure(s) (LRB): MEDIASTINAL EXPLORATION (N/A) TRANSESOPHAGEAL ECHOCARDIOGRAM (TEE) (N/A)   Events: Confused this am Pulled her swan _______________________________________________________________ Vitals: BP 97/63   Pulse 88   Temp 97.88 F (36.6 C)   Resp (!) 8   Ht '5\' 6"'$  (1.676 m)   Wt 78.1 kg   SpO2 97%   BMI 27.79 kg/m   - Neuro: arousable, disoriented  - Cardiovascular: junctional in 50s.    Drips: milr 0.125, epi 3.   PAP: (18-36)/(7-18) 29/12 CVP:  [8 mmHg-16 mmHg] 16 mmHg CO:  [2.6 L/min-4.3 L/min] 4 L/min CI:  [1.4 L/min/m2-2.3 L/min/m2] 2.1 L/min/m2  - Pulm: EWOB, SS CT output ABG    Component Value Date/Time   PHART 7.442 07/23/2020 0434   PCO2ART 42.7 07/23/2020 0434   PO2ART 68 (L) 07/23/2020 0434   HCO3 29.2 (H) 07/23/2020 0434   TCO2 31 07/23/2020 0434   ACIDBASEDEF 1.0 07/22/2020 0814   O2SAT 94.0 07/23/2020 0434    - Abd: soft - Extremity: warm  .Intake/Output      03/25 0701 03/26 0700 03/26 0701 03/27 0700   I.V. (mL/kg) 1109.8 (14.2) 84.8 (1.1)   Blood     IV Piggyback 699.1 51.1   Total Intake(mL/kg) 1808.9 (23.2) 135.9 (1.7)   Urine (mL/kg/hr) 680 (0.4) 125 (0.7)   Blood     Chest Tube 875 90   Total Output 1555 215   Net +253.9 -79.1           _______________________________________________________________ Labs: CBC Latest Ref Rng & Units 07/23/2020 07/23/2020 07/22/2020  WBC 4.0 - 10.5 K/uL - 9.7 9.3  Hemoglobin 12.0 - 15.0 g/dL 8.5(L) 9.6(L) 10.9(L)  Hematocrit 36.0 - 46.0 % 25.0(L) 28.0(L) 31.1(L)  Platelets 150 - 400 K/uL - 47(L) 52(L)   CMP Latest Ref Rng & Units 07/23/2020 07/23/2020 07/22/2020  Glucose 70 - 99 mg/dL - 139(H) 148(H)  BUN 8 - 23 mg/dL - 28(H) 24(H)  Creatinine 0.44 - 1.00 mg/dL - 1.90(H) 1.91(H)  Sodium 135 - 145 mmol/L 152(H) 150(H) 150(H)  Potassium 3.5 - 5.1 mmol/L 3.4(L)  3.4(L) 3.6  Chloride 98 - 111 mmol/L - 113(H) 114(H)  CO2 22 - 32 mmol/L - 27 27  Calcium 8.9 - 10.3 mg/dL - 8.3(L) 8.3(L)    CXR: pending  _______________________________________________________________  Assessment and Plan: POD 2 s/p CABG 5.  POD 1 s/p re-exploration  Neuro: will re-orient CV: on milr, and epi.  Will wean epi first.  On asp/statin.  DAPT prior to D/C Pulm: pulm toilet Renal: diuresing today per CHF.  Creat trending up.  Will continue to watch for now GI: confused so not on diet.  Will obtain swallow study. Heme: hgb stable.  plts low.  Will continue to follow ID: afebrile Endo: SSI Dispo: continue ICU care   Lajuana Matte 07/23/2020 9:14 AM

## 2020-07-23 NOTE — Addendum Note (Signed)
Addendum  created 07/23/20 0735 by Roberts Gaudy, MD   Clinical Note Signed

## 2020-07-23 NOTE — Procedures (Signed)
Extubation Procedure Note  Patient Details:   Name: Kristin [name] DOB: 1940/07/29 MRN: [id number]   Airway Documentation:    Vent end date: 07/23/20 Vent end time: 0445   Evaluation  O2 sats: stable throughout Complications: No apparent complications Patient did tolerate procedure well. Bilateral Breath Sounds: Clear,Diminished   Yes   Patient performed NIF -24 and FVC 716m. Audible cuff leak. Patient able to lift head off pillow. RT extubated patient to 4L . Patient able to speak. Spo2 93%.  JPatsy BaltimoreMeeks 07/23/2020, 4:59 AM

## 2020-07-23 NOTE — Progress Notes (Signed)
Subjective:  Patient awake denies any complaints successfully extubated  early in this morning.  AV paced rhythm on the monitor.  Swan-Ganz accidentally pulled out already.  Intra-aortic balloon pump also DC'd yesterday.  Now on milrinone only.  Objective:  Vital Signs in the last 24 hours: Temp:  [97.88 F (36.6 C)-99.32 F (37.4 C)] 97.88 F (36.6 C) (03/26 0600) Pulse Rate:  [29-90] 89 (03/26 1000) Resp:  [0-28] 0 (03/26 1000) BP: (64-149)/(50-102) 92/52 (03/26 1000) SpO2:  [96 %-100 %] 96 % (03/26 1000) Arterial Line BP: (74-146)/(31-69) 99/36 (03/26 1000) FiO2 (%):  [40 %-50 %] 40 % (03/26 0410)  Intake/Output from previous day: 03/25 0701 - 03/26 0700 In: 1808.9 [I.V.:1109.8; IV Piggyback:699.1] Out: [id number] [Urine:680; Chest Tube:875] Intake/Output from this shift: Total I/O In: 173 [I.V.:121.9; IV Piggyback:51.1] Out: 545 [Urine:425; Chest Tube:120]  Physical Exam: Neck: no adenopathy, no carotid bruit, no JVD and supple, symmetrical, trachea midline Lungs: Decreased breath sound at bases occasional rhonchi noted Heart: regular rate and rhythm and S1, S2 normal Abdomen: soft, non-tender; bowel sounds normal; no masses,  no organomegaly Extremities: Clubbing cyanosis 1+ edema noted right groin stable  Lab Results: Recent Labs    07/22/20 1700 07/23/20 0300 07/23/20 0434  WBC 9.3 9.7  --   HGB 10.9* 9.6* 8.5*  PLT 52* 47*  --    Recent Labs    07/22/20 1700 07/23/20 0300 07/23/20 0434  NA 150* 150* 152*  K 3.6 3.4* 3.4*  CL 114* 113*  --   CO2 27 27  --   GLUCOSE 148* 139*  --   BUN 24* 28*  --   CREATININE 1.91* 1.90*  --    No results for input(s): TROPONINI in the last 72 hours.  Invalid input(s): CK, MB Hepatic Function Panel No results for input(s): PROT, ALBUMIN, AST, ALT, ALKPHOS, BILITOT, BILIDIR, IBILI in the last 72 hours. Recent Labs    07/21/20 0442  CHOL 170   No results for input(s): PROTIME in the last 72 hours.  Imaging: Imaging  results have been reviewed and DG Chest Port 1 View  Result Date: 07/22/2020 CLINICAL DATA:  Status post CABG X 5 EXAM: PORTABLE CHEST 1 VIEW COMPARISON:  Radiograph 07/21/2020 FINDINGS: Endotracheal tube tip terminates in the mid trachea 5 cm from the carina. Right IJ catheter sheath remains in place with Swan-Ganz catheter terminating near the pulmonary trunk. A transesophageal tube appears curled within the distal thoracic esophagus, redirected superiorly. Recommend repositioning. Mediastinal and left pleural drains in place. Epicardial pacer wires are seen. Telemetry leads and additional external support devices overlie the chest. Stable postoperative cardiomediastinal contours. Some residual hazy opacities in the lungs may reflect a combination of atelectasis and edema with trace residual left effusion. No visible pneumothorax. Linear lucency extending from the lower mediastinum across the upper abdomen may be related to skin fold. No other acute osseous or soft tissue abnormality. Degenerative changes are present in the imaged spine and shoulders. IMPRESSION: 1. Lines and tubes as described above. 2. A transesophageal tube appears curled within the distal thoracic esophagus, redirected superiorly. Recommend repositioning. 3. Linear lucency extending from the lower mediastinum across the upper abdomen may be related to skin fold, could consider repositioning and reimaging. 4. Otherwise satisfactory postoperative appearance of the chest. Electronically Signed   By: Lovena Le M.D.   On: 07/22/2020 04:31   DG CHEST PORT 1 VIEW  Result Date: 07/21/2020 CLINICAL DATA:  Cardiorespiratory failure EXAM: PORTABLE CHEST 1 VIEW COMPARISON:  07/21/2020 FINDINGS: Changes of CABG. Support devices including left chest tubes are stable. No pneumothorax. Left base atelectasis with small bilateral effusions. IMPRESSION: Postoperative changes. Small bilateral effusions with left base atelectasis. No pneumothorax.  Electronically Signed   By: Rolm Baptise M.D.   On: 07/21/2020 22:15   DG Chest Port 1 View  Result Date: 07/21/2020 CLINICAL DATA:  Status post coronary bypass grafting EXAM: PORTABLE CHEST 1 VIEW COMPARISON:  CT from earlier in the same day. FINDINGS: Cardiac shadow is enlarged. Postsurgical changes are now seen. Endotracheal tube and gastric catheter are noted in satisfactory position. Improved placement of intra-aortic balloon pump is noted just below the aortic knob. Swan-Ganz catheter is noted in the pulmonary outflow tract. Mediastinal and left thoracostomy catheters are seen. No pneumothorax is noted. No focal infiltrate is seen. IMPRESSION: Postsurgical changes with tubes and lines as described above. No acute abnormality noted. Electronically Signed   By: Inez Catalina M.D.   On: 07/21/2020 16:47    Cardiac Studies:  Assessment/Plan:  Acute non-Q wave myocardial infarction status post left cardiac catheterization noted to have three-vessel coronary artery disease status post CABG x5 complicated postoperatively by cardiac tamponade requiring evacuation of the hematoma postop day 2 doing well Status post acute respiratory failure status post extubation tolerating well on nasal cannula Hypertension Diabetes mellitus Postop anemia Thrombocytopenia Acute on chronic kidney injury Morbid obesity Plan Continue present management per CVTS/heart failure team Replace K Check labs in a.m.  LOS: 3 days    Charolette Forward 07/23/2020, 10:31 AM

## 2020-07-23 NOTE — Progress Notes (Signed)
Patient pulled on wedge holding a-line in place and partially removed catheter. RN removed a line and placed dressing.

## 2020-07-23 NOTE — Evaluation (Signed)
Clinical/Bedside Swallow Evaluation Patient Details  Name: Kristin [name] MRN: [id number] Date of Birth: 27-Sep-1940  Today's Date: 07/23/2020 Time: SLP Start Time (ACUTE ONLY): [id number] SLP Stop Time (ACUTE ONLY): 1437 SLP Time Calculation (min) (ACUTE ONLY): 22 min  Past Medical History:  Past Medical History:  Diagnosis Date  . Depression   . Diabetes mellitus without complication ([place])   . Hypertension   . Night sweats    Past Surgical History:  Past Surgical History:  Procedure Laterality Date  . CORONARY ARTERY BYPASS GRAFT N/A 07/21/2020   Procedure: CORONARY ARTERY BYPASS GRAFTING (CABG), TIMES FIVE, USING LEFT INTERNAL MAMMARY ARTERY AND ENDOSCOPICALLY HARVESTED BILATERAL GREATER SAPHENOUS VEINS;  Surgeon: [name], MD;  Location: [place];  Service: Open Heart Surgery;  Laterality: N/A;  . ENDOVEIN HARVEST OF GREATER SAPHENOUS VEIN N/A 07/21/2020   Procedure: ENDOVEIN HARVEST OF BILATERAL GREATER SAPHENOUS VEINS;  Surgeon: [name], MD;  Location: [place];  Service: Open Heart Surgery;  Laterality: N/A;  . EYE SURGERY    . GALLBLADDER SURGERY    . IABP INSERTION N/A 07/21/2020   Procedure: IABP Insertion;  Surgeon: Charolette Forward, MD;  Location: [place] [facility];  Service: Cardiovascular;  Laterality: N/A;  . LEFT HEART CATH AND CORONARY ANGIOGRAPHY N/A 07/21/2020   Procedure: LEFT HEART CATH AND CORONARY ANGIOGRAPHY;  Surgeon: Charolette Forward, MD;  Location: [place] [facility];  Service: Cardiovascular;  Laterality: N/A;  . TEE WITHOUT CARDIOVERSION N/A 07/21/2020   Procedure: TRANSESOPHAGEAL ECHOCARDIOGRAM (TEE);  Surgeon: [name], MD;  Location: [place];  Service: Open Heart Surgery;  Laterality: N/A;  . toenail surgery     both big toenails   HPI:  Pt is a 80 yo female admitted with NSTEMI, cadiogenic shock s/p emergent CABG x5 on 3/24. She returned to the OR on 3/25 for tamponade requiring evacuation f hematoma. ETT 3/24-3/26. PMH includes DM, obesity,  HTN, HL   Assessment / Plan / Recommendation Clinical Impression  Pt shows signs of an acute dysphagia suspected to be related to recent intubation as well as current mentation. Her vocal quality sounds clear, but she does have facial grimacing as she tries to swallow. Pt also needs frequent cues to attend to and accept bolues. Immediate coughing is noted fairly consistently with thin liquids, but not with purees. She does not appear ready to initiate a PO diet today, but would consider providing meds crushed in puree as long as mentation allows. If she becomes more alert, could also provide a few single pieces of ice at a time after oral care is completed, in order to introduce some moisture into her oral cavity and pharynx. SLP will continue to follow for readiness to advance diet, with prognosis good given additional time post-extubation and improved alertness.  SLP Visit Diagnosis: Dysphagia, unspecified (R13.10)    Aspiration Risk  Moderate aspiration risk    Diet Recommendation NPO except meds   Medication Administration: Via alternative means    Other  Recommendations Oral Care Recommendations: Oral care QID Other Recommendations: Have oral suction available   Follow up Recommendations  (tba)      Frequency and Duration min 2x/week  2 weeks       Prognosis Prognosis for Safe Diet Advancement: Good Barriers to Reach Goals: Cognitive deficits      Swallow Study   General HPI: Pt is a 80 yo female admitted with NSTEMI, cadiogenic shock s/p emergent CABG x5 on 3/24. She returned to the  OR on 3/25 for tamponade requiring evacuation f hematoma. ETT 3/24-3/26. PMH includes DM, obesity, HTN, HL Type of Study: Bedside Swallow Evaluation Previous Swallow Assessment: none in chart Diet Prior to this Study: NPO Temperature Spikes Noted: No Respiratory Status: Nasal cannula History of Recent Intubation: No Behavior/Cognition: Lethargic/Drowsy;Cooperative;Confused;Requires  cueing Oral Cavity Assessment: Within Functional Limits Oral Care Completed by SLP: No Oral Cavity - Dentition: Missing dentition;Other (Comment) (partial dentures present but not placed for eval) Vision:  (mostly keeps her eyes closed) Self-Feeding Abilities: Total assist Patient Positioning: Upright in bed Baseline Vocal Quality: Normal Volitional Cough: Weak Volitional Swallow: Unable to elicit    Oral/Motor/Sensory Function Overall Oral Motor/Sensory Function: Mild impairment Facial ROM: Within Functional Limits Facial Symmetry: Within Functional Limits Facial Strength: Reduced right;Reduced left Lingual ROM: Within Functional Limits Lingual Symmetry: Abnormal symmetry right (mild deviation to the R) Lingual Strength: Reduced   Ice Chips Ice chips: Within functional limits Presentation: Spoon   Thin Liquid Thin Liquid: Impaired Presentation: Cup;Spoon Pharyngeal  Phase Impairments: Cough - Immediate    Nectar Thick Nectar Thick Liquid: Not tested   Honey Thick Honey Thick Liquid: Not tested   Puree Puree: Within functional limits Presentation: Spoon   Solid     Solid: Not tested      Osie Bond., M.A. [place] Pager [phone] Office [phone]  07/23/2020,3:04 PM

## 2020-07-23 NOTE — Progress Notes (Signed)
Anesthesiology Follow-up:  80 year old female admitted 3/23 with NSTEMI and severe LMCA  stenosis and 3V disease by cath She underwent emergent CABG X 5 and subsequently required reexploration for mediastinal bleeding early 3/24. Patient extubated at  0500 today. Balloon pump removed  Yesterday afternoon. Now awake, sleepy, answering simple questions complaining of dry mouth. Hemodynamically stable on Epinephrine 4 mcg/min Milrinone 0.125 mcg/kg/min and vasopressin 0.03 u/min.  VS: T- 36.7 BP- 131/50 HR- 90 (AV paced) RR- 15 O2 sat 97% on 4L PA 29/9  K- 3.4 BUN/Cr- 28/1.9 H/H- 8.5/25.0 Platelets- 47,000  Doing better following mediastinal exploration, patient has longstanding DM and baseline renal insufficiency.   Roberts Gaudy

## 2020-07-24 ENCOUNTER — Inpatient Hospital Stay (HOSPITAL_COMMUNITY): Payer: Medicare HMO

## 2020-07-24 DIAGNOSIS — R570 Cardiogenic shock: Secondary | ICD-10-CM | POA: Diagnosis not present

## 2020-07-24 DIAGNOSIS — D696 Thrombocytopenia, unspecified: Secondary | ICD-10-CM | POA: Diagnosis not present

## 2020-07-24 DIAGNOSIS — F29 Unspecified psychosis not due to a substance or known physiological condition: Secondary | ICD-10-CM | POA: Diagnosis not present

## 2020-07-24 LAB — BPAM RBC
Blood Product Expiration Date: 202204212359
Blood Product Expiration Date: 202204232359
Blood Product Expiration Date: 202204232359
Blood Product Expiration Date: 202204242359
Blood Product Expiration Date: 202204242359
Blood Product Expiration Date: 202204242359
Blood Product Expiration Date: 202204242359
Blood Product Expiration Date: 202204242359
ISSUE DATE / TIME: 202203241658
ISSUE DATE / TIME: 202203241802
ISSUE DATE / TIME: 202203250002
ISSUE DATE / TIME: 202203250002
ISSUE DATE / TIME: 202203250238
ISSUE DATE / TIME: 202203250238
ISSUE DATE / TIME: 202203250238
ISSUE DATE / TIME: 202203250535
Unit Type and Rh: 5100
Unit Type and Rh: 5100
Unit Type and Rh: 5100
Unit Type and Rh: 5100
Unit Type and Rh: 5100
Unit Type and Rh: 5100
Unit Type and Rh: 5100
Unit Type and Rh: 5100

## 2020-07-24 LAB — COOXEMETRY PANEL
Carboxyhemoglobin: 1.2 % (ref 0.5–1.5)
Methemoglobin: 1.2 % (ref 0.0–1.5)
O2 Saturation: 67.7 %
Total hemoglobin: 8.9 g/dL — ABNORMAL LOW (ref 12.0–16.0)

## 2020-07-24 LAB — TYPE AND SCREEN
ABO/RH(D): O POS
Antibody Screen: NEGATIVE
Unit division: 0
Unit division: 0
Unit division: 0
Unit division: 0
Unit division: 0
Unit division: 0
Unit division: 0
Unit division: 0

## 2020-07-24 LAB — CBC
HCT: 26.5 % — ABNORMAL LOW (ref 36.0–46.0)
Hemoglobin: 8.6 g/dL — ABNORMAL LOW (ref 12.0–15.0)
MCH: 30.9 pg (ref 26.0–34.0)
MCHC: 32.5 g/dL (ref 30.0–36.0)
MCV: 95.3 fL (ref 80.0–100.0)
Platelets: 50 10*3/uL — ABNORMAL LOW (ref 150–400)
RBC: 2.78 MIL/uL — ABNORMAL LOW (ref 3.87–5.11)
RDW: 17.3 % — ABNORMAL HIGH (ref 11.5–15.5)
WBC: 10.5 10*3/uL (ref 4.0–10.5)
nRBC: 0.7 % — ABNORMAL HIGH (ref 0.0–0.2)

## 2020-07-24 LAB — GLUCOSE, CAPILLARY
Glucose-Capillary: 135 mg/dL — ABNORMAL HIGH (ref 70–99)
Glucose-Capillary: 150 mg/dL — ABNORMAL HIGH (ref 70–99)
Glucose-Capillary: 151 mg/dL — ABNORMAL HIGH (ref 70–99)
Glucose-Capillary: 166 mg/dL — ABNORMAL HIGH (ref 70–99)
Glucose-Capillary: 86 mg/dL (ref 70–99)
Glucose-Capillary: 99 mg/dL (ref 70–99)

## 2020-07-24 LAB — BASIC METABOLIC PANEL
Anion gap: 6 (ref 5–15)
BUN: 36 mg/dL — ABNORMAL HIGH (ref 8–23)
CO2: 29 mmol/L (ref 22–32)
Calcium: 7.9 mg/dL — ABNORMAL LOW (ref 8.9–10.3)
Chloride: 113 mmol/L — ABNORMAL HIGH (ref 98–111)
Creatinine, Ser: 2.01 mg/dL — ABNORMAL HIGH (ref 0.44–1.00)
GFR, Estimated: 25 mL/min — ABNORMAL LOW (ref >60)
Glucose, Bld: 170 mg/dL — ABNORMAL HIGH (ref 70–99)
Potassium: 3.4 mmol/L — ABNORMAL LOW (ref 3.5–5.1)
Sodium: 148 mmol/L — ABNORMAL HIGH (ref 135–145)

## 2020-07-24 MED ORDER — POTASSIUM CHLORIDE CRYS ER 20 MEQ PO TBCR
20.0000 meq | EXTENDED_RELEASE_TABLET | ORAL | Status: AC
  Administered 2020-07-24 (×3): 20 meq via ORAL
  Filled 2020-07-24 (×3): qty 1

## 2020-07-24 MED ORDER — MIDODRINE HCL 5 MG PO TABS
5.0000 mg | ORAL_TABLET | Freq: Three times a day (TID) | ORAL | Status: DC
  Administered 2020-07-24 – 2020-07-25 (×4): 5 mg via ORAL
  Filled 2020-07-24 (×4): qty 1

## 2020-07-24 MED ORDER — HALOPERIDOL LACTATE 5 MG/ML IJ SOLN
1.0000 mg | Freq: Once | INTRAMUSCULAR | Status: AC
  Administered 2020-07-24: 1 mg via INTRAVENOUS
  Filled 2020-07-24: qty 1

## 2020-07-24 NOTE — Progress Notes (Signed)
Subjective:  Patient drowsy easily arousable denies any complaints.  AV sequential paced rhythm on the monitor now had episode of asystole for 15 seconds earlier.  Objective:  Vital Signs in the last 24 hours: Temp:  [96.26 F (35.7 C)-98.3 F (36.8 C)] 96.26 F (35.7 C) (03/27 1100) Pulse Rate:  [88-90] 90 (03/27 1100) Resp:  [0-29] 20 (03/27 1100) BP: (64-138)/(40-70) 118/59 (03/27 1100) SpO2:  [88 %-99 %] 95 % (03/27 1100) Arterial Line BP: (65-109)/(31-37) 65/37 (03/26 1500) FiO2 (%):  [40 %] 40 % (03/27 0756) Weight:  [87.7 kg] 87.7 kg (03/27 0858)  Intake/Output from previous day: 03/26 0701 - 03/27 0700 In: 631 [I.V.:479.9; IV Piggyback:151.1] Out: [id number] [Urine:1249; Chest Tube:570] Intake/Output from this shift: Total I/O In: 1024.7 [P.O.:240; I.V.:784.7] Out: 605 [Urine:525; Chest Tube:80]  Physical Exam: Neck: no adenopathy, no carotid bruit, no JVD and supple, symmetrical, trachea midline Lungs: Clear to auscultation anteriorly Heart: regular rate and rhythm and S1, S2 normal Abdomen: soft, non-tender; bowel sounds normal; no masses,  no organomegaly Extremities: No clubbing cyanosis 1+ edema  Lab Results: Recent Labs    07/23/20 0300 07/23/20 0434 07/24/20 0315  WBC 9.7  --  10.5  HGB 9.6* 8.5* 8.6*  PLT 47*  --  50*   Recent Labs    07/23/20 0300 07/23/20 0434 07/24/20 0315  NA 150* 152* 148*  K 3.4* 3.4* 3.4*  CL 113*  --  113*  CO2 27  --  29  GLUCOSE 139*  --  170*  BUN 28*  --  36*  CREATININE 1.90*  --  2.01*   No results for input(s): TROPONINI in the last 72 hours.  Invalid input(s): CK, MB Hepatic Function Panel No results for input(s): PROT, ALBUMIN, AST, ALT, ALKPHOS, BILITOT, BILIDIR, IBILI in the last 72 hours. No results for input(s): CHOL in the last 72 hours. No results for input(s): PROTIME in the last 72 hours.  Imaging: Imaging results have been reviewed and DG Chest Port 1 View  Result Date: 07/24/2020 CLINICAL DATA:   Pleural effusion EXAM: PORTABLE CHEST 1 VIEW COMPARISON:  07/22/2020 FINDINGS: Interval extubation and removal of right IJ Swan-Ganz catheter. Two left chest tubes and mediastinal drain. No pneumothorax is seen. Small layering right pleural effusion. Associated mild right lower lobe atelectasis. Cardiomegaly.  Postsurgical changes related to prior CABG. Median sternotomy.  Right IJ venous sheath. IMPRESSION: Two left chest tubes and mediastinal drain. No pneumothorax is seen. Small layering right pleural effusion. Associated mild right lower lobe atelectasis. Interval extubation. Additional support apparatus as above. Electronically Signed   By: Julian Hy M.D.   On: 07/24/2020 07:26    Cardiac Studies:  Assessment/Plan:  Acute non-Q wave myocardial infarction status post left cardiac catheterization noted to have three-vessel coronary artery disease status post CABG x5 complicated postoperatively by cardiac tamponade requiring evacuation of the hematoma postop day 3 doing well Status post acute respiratory failure status post extubation tolerating well on nasal cannula Status post transient asystole now pacemaker dependent Hypertension Diabetes mellitus Postop anemia Thrombocytopenia Acute on chronic kidney injury Morbid obesity CCU psychosis Plan Continue present management per CVTS May need EP consult in few days if remains pacemaker dependent  LOS: 4 days    Charolette Forward 07/24/2020, 12:23 PM

## 2020-07-24 NOTE — Progress Notes (Addendum)
At [place] patient had a 10 second run of asystole in which no measurable p waves or QRS complexes were noted. Dr. Kipp Brood paged and made aware. RN ordered to turn both mA's to 20 and RN informed that patient is in complete heart block and that patient should wear mitts.

## 2020-07-24 NOTE — Progress Notes (Signed)
  Speech Language Pathology Treatment: Dysphagia  Patient Details Name: Kristin [name] MRN: [id number] DOB: [birth year] Today's Date: 07/24/2020 Time: [id number] SLP Time Calculation (min) (ACUTE ONLY): 25 min  Assessment / Plan / Recommendation Clinical Impression  Patient seen to address dysphagia goals by ST. Patient lethargic and keeps eyes mostly closed but able to be alert enough to safely take PO's of water sips via cup, small ice chips and puree solids (applesauce). Patient exhibited delayed oral transit and suspected delayed swallow initiation with puree solids, thin liquids, ice chips, however she did not exhibit any coughing, throat clearing or changes in vitals; voice remained clear throughout. As patient continues to not be alert enough for trial/recommendation of full PO's, SLP is currently recommending NPO except meds crushed in puree, small cup sips plain water when patient is alert. SLP to continue to follow patient for readiness to initiate full PO diet.    HPI HPI: Pt is a 80 yo female admitted with NSTEMI, cadiogenic shock s/p emergent CABG x5 on 3/24. She returned to the OR on 3/25 for tamponade requiring evacuation f hematoma. ETT 3/24-3/26. PMH includes DM, obesity, HTN, HL      SLP Plan  Continue with current plan of care       Recommendations  Diet recommendations: NPO;Other(comment) (meds crushed in puree, small cup sips water if alert PRN) Liquids provided via: Cup;No straw Medication Administration: Crushed with puree Compensations: Minimize environmental distractions;Slow rate;Small sips/bites Postural Changes and/or Swallow Maneuvers: Seated upright 90 degrees                Oral Care Recommendations: Oral care QID Follow up Recommendations: Skilled Nursing facility;24 hour supervision/assistance SLP Visit Diagnosis: Dysphagia, unspecified (R13.10) Plan: Continue with current plan of care       De[place], [place],  [place] 07/24/20 9:58 AM

## 2020-07-24 NOTE — Progress Notes (Signed)
Patient ID: Kristin [name], female   DOB: 11-01-1940, 80 y.o.   MRN: [id number]    Cardiologist: Terrence Dupont  Advanced Heart Failure Rounding Note   Subjective:    - 3/24 NSTEMI - 3/24 Emergent CABG x 5 (LIMA to LAD, SEQ SVG to OM and Diagonal, SEQ to PL and PDA) - 3/25 take back to OR for tamponade  Very agitated yesterday, screaming.  Currently on Precedex drip and more calm, when I ask her where she is, she says "in the crazy house."   She is on NE 3, milrinone 0.125. CVP 9, no weight yet, BP stable, co-ox 68%.  Na lower 148 today.      Plts 47K => 50K.   Creatinine 1.1 -> 1.4 -> 1.7 -> 1.9 -> 2.01  EF on TEE 55%, RV mildly down (3/25)  Objective:   Weight Range:  Vital Signs:   Temp:  [97.4 F (36.3 C)-98.3 F (36.8 C)] 97.4 F (36.3 C) (03/27 0353) Pulse Rate:  [88-90] 88 (03/27 0700) Resp:  [0-29] 19 (03/27 0700) BP: (64-138)/(40-70) 138/70 (03/27 0700) SpO2:  [88 %-99 %] 98 % (03/27 0700) Arterial Line BP: (65-139)/(31-52) 65/37 (03/26 1500) Last BM Date: 07/19/20  Weight change: Filed Weights   07/20/20 1420 07/21/20 0601  Weight: 78.1 kg 78.1 kg    Intake/Output:   Intake/Output Summary (Last 24 hours) at 07/24/2020 0724 Last data filed at 07/24/2020 0600 Gross per 24 hour  Intake 631.02 ml  Output 1819 ml  Net -1187.98 ml      Physical Exam: General: Restrained Neck: No JVD, no thyromegaly or thyroid nodule.  Lungs: Clear to auscultation bilaterally with normal respiratory effort. CV: Nondisplaced PMI.  Heart regular S1/S2, no S3/S4, no murmur.  No peripheral edema.   Abdomen: Soft, nontender, no hepatosplenomegaly, no distention.  Skin: Intact without lesions or rashes.  Neurologic: Alert and oriented x 3.  Psych: Normal affect. Extremities: No clubbing or cyanosis.  HEENT: Normal.   Telemetry: AV paced 70 Personally reviewed   Labs: Basic Metabolic Panel: Recent Labs  Lab 07/20/20 1538 07/21/20 0442 07/21/20 2105 07/21/20 2111  07/22/20 0404 07/22/20 0422 07/22/20 0814 07/22/20 1700 07/23/20 0300 07/23/20 0434 07/24/20 0315  NA  --    < > 144   < > 150*   < > 150* 150* 150* 152* 148*  K  --    < > 4.0   < > 4.0   < > 3.4* 3.6 3.4* 3.4* 3.4*  CL  --    < > 116*  --  111  --   --  114* 113*  --  113*  CO2  --    < > 17*  --  20*  --   --  27 27  --  29  GLUCOSE  --    < > 103*  --  124*  --   --  148* 139*  --  170*  BUN  --    < > 20  --  19  --   --  24* 28*  --  36*  CREATININE  --    < > 1.43*  --  1.70*  --   --  1.91* 1.90*  --  2.01*  CALCIUM  --    < > 7.6*  --  7.5*  --   --  8.3* 8.3*  --  7.9*  MG 1.9  --   --   --  2.5*  --   --  2.4  --   --   --    < > = values in this interval not displayed.    Liver Function Tests: No results for input(s): AST, ALT, ALKPHOS, BILITOT, PROT, ALBUMIN in the last 168 hours. No results for input(s): LIPASE, AMYLASE in the last 168 hours. No results for input(s): AMMONIA in the last 168 hours.  CBC: Recent Labs  Lab 07/21/20 2105 07/21/20 2111 07/22/20 0404 07/22/20 0422 07/22/20 0814 07/22/20 1700 07/23/20 0300 07/23/20 0434 07/24/20 0315  WBC 10.5  --  8.6  --   --  9.3 9.7  --  10.5  HGB 9.6*   < > 11.5*   < > 11.6* 10.9* 9.6* 8.5* 8.6*  HCT 29.2*   < > 35.0*   < > 34.0* 31.1* 28.0* 25.0* 26.5*  MCV 99.0  --  95.1  --   --  88.1 90.6  --  95.3  PLT 134*  --  70*  --   --  52* 47*  --  50*   < > = values in this interval not displayed.    Cardiac Enzymes: No results for input(s): CKTOTAL, CKMB, CKMBINDEX, TROPONINI in the last 168 hours.  BNP: BNP (last 3 results) No results for input(s): BNP in the last 8760 hours.  ProBNP (last 3 results) No results for input(s): PROBNP in the last 8760 hours.    Other results:  Imaging: No results found.   Medications:     Scheduled Medications: . sodium chloride   Intravenous Once  . sodium chloride   Intravenous Once  . sodium chloride   Intravenous Once  . acetaminophen  1,000 mg Oral Q6H    Or  . acetaminophen (TYLENOL) oral liquid 160 mg/5 mL  1,000 mg Per Tube Q6H  . aspirin EC  325 mg Oral Daily   Or  . aspirin  324 mg Per Tube Daily  . atorvastatin  80 mg Oral q1800  . bisacodyl  10 mg Oral Daily   Or  . bisacodyl  10 mg Rectal Daily  . Chlorhexidine Gluconate Cloth  6 each Topical Daily  . docusate sodium  200 mg Oral Daily  . enoxaparin (LOVENOX) injection  30 mg Subcutaneous QHS  . furosemide  40 mg Intravenous BID  . insulin aspart  0-24 Units Subcutaneous Q4H  . insulin detemir  10 Units Subcutaneous Daily  . mouth rinse  15 mL Mouth Rinse BID  . pantoprazole  40 mg Oral Daily  . potassium chloride  20 mEq Oral Q4H  . QUEtiapine  50 mg Oral Daily  . sodium chloride flush  3 mL Intravenous Q12H    Infusions: . sodium chloride Stopped (07/22/20 2016)  . sodium chloride    . sodium chloride 10 mL/hr (07/21/20 1545)  . dexmedetomidine (PRECEDEX) IV infusion 0.5 mcg/kg/hr (07/23/20 2229)  . electrolyte-A Stopped (07/22/20 0030)  . epinephrine 3 mcg/min (07/23/20 0851)  . insulin Stopped (07/23/20 1457)  . lactated ringers    . lactated ringers    . lactated ringers 20 mL/hr at 07/23/20 1900  . milrinone 0.125 mcg/kg/min (07/23/20 1900)  . nitroGLYCERIN 0 mcg/min (07/21/20 1900)  . norepinephrine (LEVOPHED) Adult infusion 3 mcg/min (07/23/20 2236)  . phenylephrine (NEO-SYNEPHRINE) Adult infusion Stopped (07/22/20 0149)  . sodium bicarbonate (isotonic) 150 mEq in D5W 1000 mL infusion Stopped (07/22/20 0112)  . vasopressin Stopped (07/23/20 0910)    PRN Medications: sodium chloride, dextrose, lactated ringers, metoprolol tartrate, morphine injection, ondansetron (ZOFRAN) IV,  oxyCODONE, sodium chloride flush, traMADol   Assessment/Plan:   1. Cardiogenic shock - s/p CABG 3/24 with take back for tamponade on 3/25 - LVEF 55% on post-op TEE 3/25 - Remains on NE 3, milrinone 0.125.   - Co-ox 68%.  MAP stable, wean down NE today.   - CVP 9 today,  creatinine mildly higher at 2.  Will give 1 dose of Lasix 40 mg IV today.     2. CAD s/p NSTEMI  - CABG x 5 3/24 - no evidence of ongoing ischemia - continue ASA, statin. Stop b-blocker with shock - will need DAPT prior to d/c  3. Acute hypoxic respiratory failure post-op - Extubated.  - Diurese today.   4. AKI - baseline creatinine 1.1 -> 1.5 -> 1.7 -> 1.9 -> 2 - due to shock/ATN - continue hemodynamic support   5. Acute blood loss anemia, post -op - transfuse to keep hgb >= 7.5, 8.6 today.  6. Hypernatremia - She has been eating/drinking, Na lower at 148 today.   7. Low platelets - Plts have stabilized and mildly higher today at 50K, suspect inflammatory and doubt HIT.  Follow closely.   8. Post-op delirium - Baseline very anxious.  Now on Precedex gtt.  Wean slowly today.   Mobilize.    CRITICAL CARE Performed by: Loralie Champagne  Total critical care time: 35 minutes  Critical care time was exclusive of separately billable procedures and treating other patients.  Critical care was necessary to treat or prevent imminent or life-threatening deterioration.  Critical care was time spent personally by me on the following activities: development of treatment plan with patient and/or surrogate as well as nursing, discussions with consultants, evaluation of patient's response to treatment, examination of patient, obtaining history from patient or surrogate, ordering and performing treatments and interventions, ordering and review of laboratory studies, ordering and review of radiographic studies, pulse oximetry and re-evaluation of patient's condition.   Length of Stay: 4   Loralie Champagne MD 07/24/2020, 7:24 AM  Advanced Heart Failure Team Pager [phone] (M-F; 7a - 4p)  Please contact [facility] for night-coverage after hours (4p -7a ) and weekends on [email or web address]

## 2020-07-24 NOTE — Progress Notes (Signed)
      [address]       [place] [address]             [phone]                 2 Days Post-Op Procedure(s) (LRB): MEDIASTINAL EXPLORATION (N/A) TRANSESOPHAGEAL ECHOCARDIOGRAM (TEE) (N/A)   Events: Confused this am  _______________________________________________________________ Vitals: BP (!) 96/52   Pulse 88   Temp (!) 96.62 F (35.9 C)   Resp 20   Ht '5\' 6"'$  (1.676 m)   Wt 87.7 kg   SpO2 93%   BMI 31.21 kg/m   - Neuro: arousable, disoriented  - Cardiovascular: complete   Drips: milr 0.125, levo 3.   CVP:  [4 mmHg-66 mmHg] 8 mmHg  - Pulm: EWOB, SS CT output ABG    Component Value Date/Time   PHART 7.442 07/23/2020 0434   PCO2ART 42.7 07/23/2020 0434   PO2ART 68 (L) 07/23/2020 0434   HCO3 29.2 (H) 07/23/2020 0434   TCO2 31 07/23/2020 0434   ACIDBASEDEF 1.0 07/22/2020 0814   O2SAT 67.7 07/24/2020 0315    - Abd: soft - Extremity: warm  .Intake/Output      03/26 0701 03/27 0700 03/27 0701 03/28 0700   I.V. (mL/kg) 479.9 (6.1) 697.1 (7.9)   IV Piggyback 151.1    Total Intake(mL/kg) 631 (8.1) 697.1 (7.9)   Urine (mL/kg/hr) 1249 (0.7) 50 (0.3)   Chest Tube 570 20   Total Output 1819 70   Net -1188 +627.1           _______________________________________________________________ Labs: CBC Latest Ref Rng & Units 07/24/2020 07/23/2020 07/23/2020  WBC 4.0 - 10.5 K/uL 10.5 - 9.7  Hemoglobin 12.0 - 15.0 g/dL 8.6(L) 8.5(L) 9.6(L)  Hematocrit 36.0 - 46.0 % 26.5(L) 25.0(L) 28.0(L)  Platelets 150 - 400 K/uL 50(L) - 47(L)   CMP Latest Ref Rng & Units 07/24/2020 07/23/2020 07/23/2020  Glucose 70 - 99 mg/dL 170(H) - 139(H)  BUN 8 - 23 mg/dL 36(H) - 28(H)  Creatinine 0.44 - 1.00 mg/dL 2.01(H) - 1.90(H)  Sodium 135 - 145 mmol/L 148(H) 152(H) 150(H)  Potassium 3.5 - 5.1 mmol/L 3.4(L) 3.4(L) 3.4(L)  Chloride 98 - 111 mmol/L 113(H) - 113(H)  CO2 22 - 32 mmol/L 29 - 27  Calcium 8.9 - 10.3 mg/dL 7.9(L) - 8.3(L)    CXR: Right  effusion  _______________________________________________________________  Assessment and Plan: POD3 s/p CABG 5.  POD 2 s/p re-exploration  Neuro: on seroquel and precedex.  In restraints overnight CV: on milr, and levo.  Will add midodrine.  On asp/statin.  DAPT prior to D/C Pulm: pulm toilet Renal: diuresing today per CHF.  Creat trending up.  Will continue to watch for now GI: confused so not on diet.  Will obtain swallow study. Heme: hgb stable.  plts low.  Will continue to follow ID: afebrile Endo: SSI Dispo: continue ICU care   Lajuana Matte 07/24/2020 9:17 AM

## 2020-07-24 NOTE — Progress Notes (Signed)
Pt became increasingly agitated throughout the night despite frequent verbal contacts, reorientation, maximum dosage of Precedex, and mitts. Pt removed sternal dressing and attempted to remove chest tubes, foley, and introducer. Dr. Kipp Brood was paged at this time, and orders were received and implemented for 1 mg of Haldol and nonviolent restraints. Will continue to monitor closely.

## 2020-07-25 DIAGNOSIS — I442 Atrioventricular block, complete: Secondary | ICD-10-CM | POA: Diagnosis not present

## 2020-07-25 DIAGNOSIS — Z951 Presence of aortocoronary bypass graft: Secondary | ICD-10-CM | POA: Diagnosis not present

## 2020-07-25 DIAGNOSIS — I214 Non-ST elevation (NSTEMI) myocardial infarction: Principal | ICD-10-CM

## 2020-07-25 DIAGNOSIS — I314 Cardiac tamponade: Secondary | ICD-10-CM | POA: Diagnosis not present

## 2020-07-25 LAB — CBC
HCT: 26.9 % — ABNORMAL LOW (ref 36.0–46.0)
Hemoglobin: 8.6 g/dL — ABNORMAL LOW (ref 12.0–15.0)
MCH: 30.8 pg (ref 26.0–34.0)
MCHC: 32 g/dL (ref 30.0–36.0)
MCV: 96.4 fL (ref 80.0–100.0)
Platelets: 64 10*3/uL — ABNORMAL LOW (ref 150–400)
RBC: 2.79 MIL/uL — ABNORMAL LOW (ref 3.87–5.11)
RDW: 16.7 % — ABNORMAL HIGH (ref 11.5–15.5)
WBC: 8.3 10*3/uL (ref 4.0–10.5)
nRBC: 0.7 % — ABNORMAL HIGH (ref 0.0–0.2)

## 2020-07-25 LAB — BASIC METABOLIC PANEL
Anion gap: 4 — ABNORMAL LOW (ref 5–15)
Anion gap: 6 (ref 5–15)
BUN: 35 mg/dL — ABNORMAL HIGH (ref 8–23)
BUN: 33 mg/dL — ABNORMAL HIGH (ref 8–23)
CO2: 30 mmol/L (ref 22–32)
CO2: 31 mmol/L (ref 22–32)
Calcium: 7.8 mg/dL — ABNORMAL LOW (ref 8.9–10.3)
Calcium: 8.2 mg/dL — ABNORMAL LOW (ref 8.9–10.3)
Chloride: 111 mmol/L (ref 98–111)
Chloride: 108 mmol/L (ref 98–111)
Creatinine, Ser: 1.77 mg/dL — ABNORMAL HIGH (ref 0.44–1.00)
Creatinine, Ser: 1.65 mg/dL — ABNORMAL HIGH (ref 0.44–1.00)
GFR, Estimated: 29 mL/min — ABNORMAL LOW (ref >60)
GFR, Estimated: 32 mL/min — ABNORMAL LOW (ref >60)
Glucose, Bld: 96 mg/dL (ref 70–99)
Glucose, Bld: 119 mg/dL — ABNORMAL HIGH (ref 70–99)
Potassium: 3.4 mmol/L — ABNORMAL LOW (ref 3.5–5.1)
Potassium: 3.4 mmol/L — ABNORMAL LOW (ref 3.5–5.1)
Sodium: 145 mmol/L (ref 135–145)
Sodium: 145 mmol/L (ref 135–145)

## 2020-07-25 LAB — GLUCOSE, CAPILLARY
Glucose-Capillary: 97 mg/dL (ref 70–99)
Glucose-Capillary: 121 mg/dL — ABNORMAL HIGH (ref 70–99)
Glucose-Capillary: 104 mg/dL — ABNORMAL HIGH (ref 70–99)
Glucose-Capillary: 125 mg/dL — ABNORMAL HIGH (ref 70–99)
Glucose-Capillary: 186 mg/dL — ABNORMAL HIGH (ref 70–99)

## 2020-07-25 LAB — COOXEMETRY PANEL
Carboxyhemoglobin: 1.5 % (ref 0.5–1.5)
Methemoglobin: 0.8 % (ref 0.0–1.5)
O2 Saturation: 65.1 %
Total hemoglobin: 8.9 g/dL — ABNORMAL LOW (ref 12.0–16.0)

## 2020-07-25 LAB — ECHO INTRAOPERATIVE TEE
Height: 66 in
Weight: 3171.1 oz

## 2020-07-25 LAB — MAGNESIUM: Magnesium: 1.8 mg/dL (ref 1.7–2.4)

## 2020-07-25 MED ORDER — ADULT MULTIVITAMIN W/MINERALS CH
1.0000 | ORAL_TABLET | Freq: Every day | ORAL | Status: DC
  Administered 2020-07-25: 1 via ORAL
  Filled 2020-07-25: qty 1

## 2020-07-25 MED ORDER — ACETAMINOPHEN 500 MG PO TABS
1000.0000 mg | ORAL_TABLET | Freq: Four times a day (QID) | ORAL | Status: DC
  Administered 2020-07-26 – 2020-08-10 (×32): 1000 mg via ORAL
  Filled 2020-07-25 (×38): qty 2

## 2020-07-25 MED ORDER — MIDODRINE HCL 5 MG PO TABS
5.0000 mg | ORAL_TABLET | Freq: Three times a day (TID) | ORAL | Status: DC
  Administered 2020-07-25 – 2020-08-04 (×29): 5 mg via ORAL
  Filled 2020-07-25 (×29): qty 1

## 2020-07-25 MED ORDER — PANTOPRAZOLE SODIUM 40 MG PO PACK
40.0000 mg | PACK | Freq: Every day | ORAL | Status: DC
  Administered 2020-07-26 – 2020-07-27 (×2): 40 mg via ORAL
  Filled 2020-07-25 (×2): qty 20

## 2020-07-25 MED ORDER — ACETAMINOPHEN 160 MG/5ML PO SOLN
1000.0000 mg | Freq: Four times a day (QID) | ORAL | Status: DC
  Administered 2020-07-26 – 2020-08-01 (×11): 1000 mg via ORAL
  Filled 2020-07-25 (×14): qty 40.6

## 2020-07-25 MED ORDER — MIDODRINE HCL 5 MG PO TABS: 5.0000 mg | ORAL_TABLET | Freq: Three times a day (TID) | ORAL | Status: DC

## 2020-07-25 MED ORDER — ASPIRIN EC 81 MG PO TBEC
81.0000 mg | DELAYED_RELEASE_TABLET | Freq: Every day | ORAL | Status: DC
Start: 2020-07-25 — End: 2020-07-25
  Administered 2020-07-25: 81 mg via ORAL
  Filled 2020-07-25: qty 1

## 2020-07-25 MED ORDER — QUETIAPINE FUMARATE 50 MG PO TABS: 50.0000 mg | ORAL_TABLET | Freq: Every day | ORAL | Status: DC

## 2020-07-25 MED ORDER — CLOPIDOGREL BISULFATE 75 MG PO TABS
75.0000 mg | ORAL_TABLET | Freq: Every day | ORAL | Status: DC
  Administered 2020-07-26: 75 mg via ORAL
  Filled 2020-07-25: qty 1

## 2020-07-25 MED ORDER — POTASSIUM CHLORIDE CRYS ER 20 MEQ PO TBCR
40.0000 meq | EXTENDED_RELEASE_TABLET | Freq: Four times a day (QID) | ORAL | Status: AC
  Administered 2020-07-25 (×2): 40 meq via ORAL
  Filled 2020-07-25 (×2): qty 2

## 2020-07-25 MED ORDER — ASPIRIN 81 MG PO CHEW: 81.0000 mg | CHEWABLE_TABLET | Freq: Every day | ORAL | Status: DC

## 2020-07-25 MED ORDER — FUROSEMIDE 10 MG/ML IJ SOLN
80.0000 mg | Freq: Two times a day (BID) | INTRAMUSCULAR | Status: DC
  Administered 2020-07-25 – 2020-07-28 (×6): 80 mg via INTRAVENOUS
  Filled 2020-07-25 (×6): qty 8

## 2020-07-25 MED ORDER — DOCUSATE SODIUM 50 MG/5ML PO LIQD
200.0000 mg | Freq: Every day | ORAL | Status: DC
  Administered 2020-07-26 – 2020-07-27 (×2): 200 mg via ORAL
  Filled 2020-07-25 (×2): qty 20

## 2020-07-25 MED ORDER — CLOPIDOGREL BISULFATE 75 MG PO TABS
75.0000 mg | ORAL_TABLET | Freq: Every day | ORAL | Status: DC
  Administered 2020-07-25: 75 mg via ORAL
  Filled 2020-07-25: qty 1

## 2020-07-25 MED ORDER — MELATONIN 3 MG PO TABS: 3.0000 mg | ORAL_TABLET | Freq: Every day | ORAL | Status: DC

## 2020-07-25 MED ORDER — POTASSIUM CHLORIDE CRYS ER 20 MEQ PO TBCR
20.0000 meq | EXTENDED_RELEASE_TABLET | ORAL | Status: DC
  Administered 2020-07-25: 20 meq via ORAL
  Filled 2020-07-25: qty 1

## 2020-07-25 MED ORDER — WHITE PETROLATUM EX OINT
TOPICAL_OINTMENT | CUTANEOUS | Status: AC
  Administered 2020-07-25: 1 via CUTANEOUS
  Filled 2020-07-25: qty 28.35

## 2020-07-25 MED ORDER — POLYETHYLENE GLYCOL 3350 17 G PO PACK
17.0000 g | PACK | Freq: Every day | ORAL | Status: DC
  Administered 2020-07-25 – 2020-08-07 (×5): 17 g via ORAL
  Filled 2020-07-25 (×11): qty 1

## 2020-07-25 MED ORDER — ADULT MULTIVITAMIN W/MINERALS CH: 1.0000 | ORAL_TABLET | Freq: Every day | ORAL | Status: DC

## 2020-07-25 MED ORDER — ATORVASTATIN CALCIUM 80 MG PO TABS: 80.0000 mg | ORAL_TABLET | Freq: Every day | ORAL | Status: DC

## 2020-07-25 MED ORDER — DOCUSATE SODIUM 50 MG/5ML PO LIQD: 200.0000 mg | Freq: Every day | ORAL | Status: DC

## 2020-07-25 MED ORDER — SORBITOL 70 % SOLN
30.0000 mL | Freq: Every day | Status: DC
  Administered 2020-07-25 – 2020-07-26 (×2): 30 mL via ORAL
  Filled 2020-07-25 (×2): qty 30

## 2020-07-25 MED ORDER — ADULT MULTIVITAMIN W/MINERALS CH
1.0000 | ORAL_TABLET | Freq: Every day | ORAL | Status: DC
  Administered 2020-07-26 – 2020-08-10 (×14): 1 via ORAL
  Filled 2020-07-25 (×16): qty 1

## 2020-07-25 MED ORDER — QUETIAPINE FUMARATE ER 50 MG PO TB24: 50.0000 mg | ORAL_TABLET | Freq: Every day | ORAL | Status: DC

## 2020-07-25 MED ORDER — ASPIRIN 81 MG PO CHEW
81.0000 mg | CHEWABLE_TABLET | Freq: Every day | ORAL | Status: DC
  Administered 2020-07-26 – 2020-08-10 (×16): 81 mg via ORAL
  Filled 2020-07-25 (×16): qty 1

## 2020-07-25 MED ORDER — QUETIAPINE FUMARATE 25 MG PO TABS
50.0000 mg | ORAL_TABLET | Freq: Every day | ORAL | Status: DC
  Administered 2020-07-25 – 2020-08-09 (×15): 50 mg via ORAL
  Filled 2020-07-25 (×3): qty 2
  Filled 2020-07-25: qty 1
  Filled 2020-07-25 (×7): qty 2
  Filled 2020-07-25: qty 1
  Filled 2020-07-25 (×4): qty 2

## 2020-07-25 MED ORDER — MAGNESIUM SULFATE 2 GM/50ML IV SOLN
2.0000 g | Freq: Once | INTRAVENOUS | Status: AC
  Administered 2020-07-25: 2 g via INTRAVENOUS
  Filled 2020-07-25: qty 50

## 2020-07-25 MED ORDER — ENSURE ENLIVE PO LIQD
237.0000 mL | Freq: Three times a day (TID) | ORAL | Status: DC
  Administered 2020-07-25 – 2020-08-08 (×26): 237 mL via ORAL

## 2020-07-25 MED ORDER — CLOPIDOGREL BISULFATE 75 MG PO TABS: 75.0000 mg | ORAL_TABLET | Freq: Every day | ORAL | Status: DC

## 2020-07-25 MED ORDER — ATORVASTATIN CALCIUM 80 MG PO TABS
80.0000 mg | ORAL_TABLET | Freq: Every day | ORAL | Status: DC
  Administered 2020-07-25 – 2020-08-09 (×15): 80 mg via ORAL
  Filled 2020-07-25 (×15): qty 1

## 2020-07-25 MED ORDER — PANTOPRAZOLE SODIUM 40 MG PO PACK: 40.0000 mg | PACK | Freq: Every day | ORAL | Status: DC

## 2020-07-25 MED ORDER — POTASSIUM CHLORIDE CRYS ER 20 MEQ PO TBCR
40.0000 meq | EXTENDED_RELEASE_TABLET | ORAL | Status: AC
  Administered 2020-07-25 (×2): 40 meq via ORAL
  Filled 2020-07-25 (×2): qty 2

## 2020-07-25 MED ORDER — MELATONIN 3 MG PO TABS
3.0000 mg | ORAL_TABLET | Freq: Every day | ORAL | Status: DC
  Administered 2020-07-25 – 2020-08-09 (×15): 3 mg via ORAL
  Filled 2020-07-25 (×16): qty 1

## 2020-07-25 MED FILL — Sodium Bicarbonate IV Soln 8.4%: INTRAVENOUS | Qty: 50 | Status: AC

## 2020-07-25 MED FILL — Calcium Chloride Inj 10%: INTRAVENOUS | Qty: 10 | Status: AC

## 2020-07-25 MED FILL — Mannitol IV Soln 20%: INTRAVENOUS | Qty: 500 | Status: AC

## 2020-07-25 MED FILL — Magnesium Sulfate Inj 50%: INTRAMUSCULAR | Qty: 10 | Status: AC

## 2020-07-25 MED FILL — Sodium Chloride IV Soln 0.9%: INTRAVENOUS | Qty: 3000 | Status: AC

## 2020-07-25 MED FILL — Heparin Sodium (Porcine) Inj 1000 Unit/ML: INTRAMUSCULAR | Qty: 10 | Status: AC

## 2020-07-25 MED FILL — Electrolyte-R (PH 7.4) Solution: INTRAVENOUS | Qty: 4000 | Status: AC

## 2020-07-25 MED FILL — Potassium Chloride Inj 2 mEq/ML: INTRAVENOUS | Qty: 40 | Status: AC

## 2020-07-25 MED FILL — Heparin Sodium (Porcine) Inj 1000 Unit/ML: INTRAMUSCULAR | Qty: 30 | Status: AC

## 2020-07-25 NOTE — Progress Notes (Signed)
Nutrition Follow-up  DOCUMENTATION CODES:  Not applicable  INTERVENTION:  Continue diet advancement per SLP.  Add Ensure Enlive po TID, each supplement provides 350 kcal and 20 grams of protein.  Add MVI with minerals daily.  NUTRITION DIAGNOSIS:  Inadequate oral intake related to acute illness,inability to eat,decreased appetite (Cardiogenic shock and CAD) as evidenced by meal completion < 50%,NPO status.  GOAL:  Patient will meet greater than or equal to 90% of their needs  MONITOR:  PO intake,Supplement acceptance,Diet advancement  REASON FOR ASSESSMENT:  Ventilator    ASSESSMENT:  80 yo female admitted with NSTEMI, required emergent CABG x 5 on 3/24, cardiogenic shock. PMH includes DM, obesity, HTN, HL 3/24 - OR for CABG x 5 3/25 - Tamponade, OR for Mediastinal exploration, TEE-LVEF 55% 3/26 - extubated 3/28 - SLP recs Dys 2 with thins  Spoke with pt at bedside. Pt in good spirits. She reports that her appetite at home was decreased, but she is starting to feel hungry again here. She was able to eat some of her lunch, but had some early satiety. She reports eating okay at home most days. She also endorses drinking Ensure or Boost at home when she has a day where she feels like she cannot eat anything.  She has not noticed any weight loss except for fluid. Fluid could be masking true weight loss and depletions on exam. Pt does not participate in Care Everywhere, so unable to attain weight history that way.  Recommend Ensure TID to promote caloric and protein intake.  Relevant Medications: colace, lasix BID, SSI, detemir, Protonix, miralax, levophed Labs: reviewed; K 3.4, CBG 96-170 HbA1c: 8.8% (06/2020)  NUTRITION - FOCUSED PHYSICAL EXAM: Flowsheet Row Most Recent Value  Orbital Region Moderate depletion  Upper Arm Region No depletion  Thoracic and Lumbar Region No depletion  Buccal Region Moderate depletion  Temple Region Moderate depletion  Clavicle Bone Region  Mild depletion  Clavicle and Acromion Bone Region No depletion  Scapular Bone Region No depletion  Dorsal Hand No depletion  Patellar Region No depletion  Anterior Thigh Region No depletion  Posterior Calf Region No depletion  Edema (RD Assessment) Mild  Hair Reviewed  Eyes Reviewed  Mouth Reviewed  Skin Reviewed  Nails Reviewed     Diet Order:   Diet Order            DIET DYS 2 Room service appropriate? Yes with Assist; Fluid consistency: Thin  Diet effective now                EDUCATION NEEDS:  Education needs have been addressed  Skin:  Skin Assessment: Skin Integrity Issues: Skin Integrity Issues:: Incisions Incisions: chest, leg  Last BM:  07/25/20  Height:  Ht Readings from Last 1 Encounters:  07/20/20 '5\' 6"'$  (1.676 m)   Weight:  Wt Readings from Last 1 Encounters:  07/25/20 89.9 kg   Ideal Body Weight:  59.1 kg  BMI:  Body mass index is 31.99 kg/m.  Estimated Nutritional Needs:  Kcal:  1600 kcals Protein:  85-100 g Fluid:  >/= 1.5 L  Derrel Nip, RD, LDN Registered Dietitian After Hours/Weekend Pager # in [place]

## 2020-07-25 NOTE — Progress Notes (Addendum)
Patient ID: Kristin [name], female   DOB: 1940-08-09, 80 y.o.   MRN: [id number]    Cardiologist: Terrence Dupont  Advanced Heart Failure Rounding Note   Subjective:    - 3/24 NSTEMI - 3/24 Emergent CABG x 5 (LIMA to LAD, SEQ SVG to OM and Diagonal, SEQ to PL and PDA) - 3/25 take back to OR for tamponade. EF on TEE 55%, RV mildly down (3/25) -3/26 Extubated  CO-OX 65%. Norepi 4 mcg + milrinone 0.125 mcg.   Diuresed with IV lasix. Negative 1.3 liters. CVP 10.   Na lower 145 today.      Plts 47K => 50=>64K.   Creatinine 1.1 -> 1.4 -> 1.7 -> 1.9 -> 2.01->1.7  Feeling ok. Asking for water. Agitated overnight.   Objective:   Weight Range:  Vital Signs:   Temp:  [96.26 F (35.7 C)-100 F (37.8 C)] 99 F (37.2 C) (03/28 0515) Pulse Rate:  [87-91] 89 (03/28 0600) Resp:  [16-31] 20 (03/28 0600) BP: (95-131)/(44-102) 126/62 (03/28 0600) SpO2:  [90 %-98 %] 91 % (03/28 0600) FiO2 (%):  [40 %] 40 % (03/27 0756) Weight:  [87.7 kg-89.9 kg] 89.9 kg (03/28 0500) Last BM Date: 07/19/20  Weight change: Filed Weights   07/21/20 0601 07/24/20 0858 07/25/20 0500  Weight: 78.1 kg 87.7 kg 89.9 kg    Intake/Output:   Intake/Output Summary (Last 24 hours) at 07/25/2020 0713 Last data filed at 07/25/2020 0600 Gross per 24 hour  Intake 1620.68 ml  Output 2995 ml  Net -1374.32 ml     CVP 10  Physical Exam: General: Sitting in the chair. No resp difficulty HEENT: normal Neck: supple. no JVD. Carotids 2+ bilat; no bruits. No lymphadenopathy or thryomegaly appreciated. RIJ  Cor: PMI nondisplaced. Regular rate & rhythm. No rubs, gallops or murmurs. Lungs: clear on 2 liters [place] Abdomen: soft, nontender, nondistended. No hepatosplenomegaly. No bruits or masses. Good bowel sounds. Extremities: no cyanosis, clubbing, rash, R and LLE 1+ edema Neuro: alert & orientedx3, cranial nerves grossly intact. moves all 4 extremities w/o difficulty. Affect flat   Telemetry: AV paced 90    Labs: Basic  Metabolic Panel: Recent Labs  Lab 07/20/20 1538 07/21/20 0442 07/22/20 0404 07/22/20 0422 07/22/20 1700 07/23/20 0300 07/23/20 0434 07/24/20 0315 07/25/20 0257  NA  --    < > 150*   < > 150* 150* 152* 148* 145  K  --    < > 4.0   < > 3.6 3.4* 3.4* 3.4* 3.4*  CL  --    < > 111  --  114* 113*  --  113* 111  CO2  --    < > 20*  --  27 27  --  29 30  GLUCOSE  --    < > 124*  --  148* 139*  --  170* 96  BUN  --    < > 19  --  24* 28*  --  36* 35*  CREATININE  --    < > 1.70*  --  1.91* 1.90*  --  2.01* 1.77*  CALCIUM  --    < > 7.5*  --  8.3* 8.3*  --  7.9* 7.8*  MG 1.9  --  2.5*  --  2.4  --   --   --   --    < > = values in this interval not displayed.    Liver Function Tests: No results for input(s): AST, ALT, ALKPHOS, BILITOT, PROT, ALBUMIN in  the last 168 hours. No results for input(s): LIPASE, AMYLASE in the last 168 hours. No results for input(s): AMMONIA in the last 168 hours.  CBC: Recent Labs  Lab 07/22/20 0404 07/22/20 0422 07/22/20 1700 07/23/20 0300 07/23/20 0434 07/24/20 0315 07/25/20 0257  WBC 8.6  --  9.3 9.7  --  10.5 8.3  HGB 11.5*   < > 10.9* 9.6* 8.5* 8.6* 8.6*  HCT 35.0*   < > 31.1* 28.0* 25.0* 26.5* 26.9*  MCV 95.1  --  88.1 90.6  --  95.3 96.4  PLT 70*  --  52* 47*  --  50* 64*   < > = values in this interval not displayed.    Cardiac Enzymes: No results for input(s): CKTOTAL, CKMB, CKMBINDEX, TROPONINI in the last 168 hours.  BNP: BNP (last 3 results) No results for input(s): BNP in the last 8760 hours.  ProBNP (last 3 results) No results for input(s): PROBNP in the last 8760 hours.    Other results:  Imaging: DG Chest Port 1 View  Result Date: 07/24/2020 CLINICAL DATA:  Pleural effusion EXAM: PORTABLE CHEST 1 VIEW COMPARISON:  07/22/2020 FINDINGS: Interval extubation and removal of right IJ Swan-Ganz catheter. Two left chest tubes and mediastinal drain. No pneumothorax is seen. Small layering right pleural effusion. Associated mild  right lower lobe atelectasis. Cardiomegaly.  Postsurgical changes related to prior CABG. Median sternotomy.  Right IJ venous sheath. IMPRESSION: Two left chest tubes and mediastinal drain. No pneumothorax is seen. Small layering right pleural effusion. Associated mild right lower lobe atelectasis. Interval extubation. Additional support apparatus as above. Electronically Signed   By: Julian Hy M.D.   On: 07/24/2020 07:26     Medications:     Scheduled Medications: . sodium chloride   Intravenous Once  . sodium chloride   Intravenous Once  . sodium chloride   Intravenous Once  . acetaminophen  1,000 mg Oral Q6H   Or  . acetaminophen (TYLENOL) oral liquid 160 mg/5 mL  1,000 mg Per Tube Q6H  . aspirin EC  325 mg Oral Daily   Or  . aspirin  324 mg Per Tube Daily  . atorvastatin  80 mg Oral q1800  . bisacodyl  10 mg Oral Daily   Or  . bisacodyl  10 mg Rectal Daily  . Chlorhexidine Gluconate Cloth  6 each Topical Daily  . docusate sodium  200 mg Oral Daily  . enoxaparin (LOVENOX) injection  30 mg Subcutaneous QHS  . furosemide  40 mg Intravenous BID  . insulin aspart  0-24 Units Subcutaneous Q4H  . insulin detemir  10 Units Subcutaneous Daily  . mouth rinse  15 mL Mouth Rinse BID  . midodrine  5 mg Oral TID WC  . pantoprazole  40 mg Oral Daily  . potassium chloride  20 mEq Oral Q4H  . QUEtiapine  50 mg Oral Daily  . sodium chloride flush  3 mL Intravenous Q12H    Infusions: . sodium chloride Stopped (07/22/20 2016)  . sodium chloride    . sodium chloride 10 mL/hr (07/21/20 1545)  . dexmedetomidine (PRECEDEX) IV infusion 0.7 mcg/kg/hr (07/25/20 0000)  . electrolyte-A Stopped (07/22/20 0030)  . epinephrine 3 mcg/min (07/23/20 0851)  . insulin Stopped (07/23/20 1457)  . lactated ringers    . lactated ringers    . lactated ringers 20 mL/hr at 07/24/20 2000  . milrinone 0.125 mcg/kg/min (07/24/20 2000)  . nitroGLYCERIN 0 mcg/min (07/21/20 1900)  . norepinephrine  (LEVOPHED) Adult  infusion 4 mcg/min (07/24/20 2000)  . phenylephrine (NEO-SYNEPHRINE) Adult infusion Stopped (07/22/20 0149)  . sodium bicarbonate (isotonic) 150 mEq in D5W 1000 mL infusion Stopped (07/22/20 0112)  . vasopressin Stopped (07/23/20 0910)    PRN Medications: sodium chloride, dextrose, lactated ringers, metoprolol tartrate, morphine injection, ondansetron (ZOFRAN) IV, oxyCODONE, sodium chloride flush, traMADol   Assessment/Plan:   1. Cardiogenic shock - s/p CABG 3/24 with take back for tamponade on 3/25 - LVEF 55% on post-op TEE 3/25 - Remains on NE 4, milrinone 0.125.   - Co-ox 65%. Maps ok. Slowly wean NE.  - CVP 10. Give 80 mg IV x2 today. Supp K   2. CAD s/p NSTEMI  - CABG x 5 3/24 - no evidence of ongoing ischemia - continue ASA, statin. No bb with shock.  - will need DAPT prior to d/c - Will SGT2i soon.   3. Acute hypoxic respiratory failure post-op - Extubated 3/26--> on 2 liters [place]  4. AKI - baseline creatinine 1.1 -> peaked at 2, today 1.7.  - due to shock/ATN - continue hemodynamic support   5. Acute blood loss anemia, post -op - transfuse to keep hgb >= 7.5, 8.6 unchanged at 8.6   6. Hypernatremia -Sodium 145   7. Low platelets - Plts trending up 50> 64K. Follow closely.   8. Post-op delirium - Baseline very anxious.  Remains on  Precedex gtt.  - Unable to crush controlled release Seroquel switch to immediate release Seroquel which can be crushed.  - Add melatonin   9. Deconditioning Consult PT/OT  10. Dysphagia Speech following  Crush meds with small sips.    Length of Stay: 5   Amy Clegg NP-C  07/25/2020, 7:13 AM  Advanced Heart Failure Team Pager [phone] (M-F; 7a - 4p)  Please contact [facility] for night-coverage after hours (4p -7a ) and weekends on [email or web address]  Agree with above.   She remains on NE 4 and milrinone 0.125. SBPs soft  CVP 10. CHB (without ventricular escape) under pacing. Co-ox 65%. Sitting up in chair.  Weak. No BM yet. CTs still in. No significant bleeding.   General:  Sitting up in bed No resp difficulty HEENT: normal Neck: supple. JVP 10 Carotids 2+ bilat; no bruits. No lymphadenopathy or thryomegaly appreciated. Cor: PMI nondisplaced. Sternal wound with mild ecchymosis + CTs Regular rate & rhythm. No rubs, gallops or murmurs. Lungs: decreased at bases  Abdomen: soft, nontender, + distended. No hepatosplenomegaly. No bruits or masses. Good bowel sounds. Extremities: no cyanosis, clubbing, rash, 2+ edema Neuro: alert & orientedx3, cranial nerves grossly intact. moves all 4 extremities w/o difficulty. Affect pleasant  Will stop milrinone. Wean NE slowly. Will ask EP to see at [name] request for underlying CHB without escape.Agree with IV diuresis. Place TED hose. Try to ambulate. Start bowel regimen.   CRITICAL CARE Performed by: [name]  Total critical care time: 35 minutes  Critical care time was exclusive of separately billable procedures and treating other patients.  Critical care was necessary to treat or prevent imminent or life-threatening deterioration.  Critical care was time spent personally by me (independent of midlevel providers or residents) on the following activities: development of treatment plan with patient and/or surrogate as well as nursing, discussions with consultants, evaluation of patient's response to treatment, examination of patient, obtaining history from patient or surrogate, ordering and performing treatments and interventions, ordering and review of laboratory studies, ordering and review of radiographic studies, pulse oximetry and re-evaluation of patient's  condition.  [name], MD  8:36 AM

## 2020-07-25 NOTE — Progress Notes (Signed)
PT Cancellation Note  Patient Details Name: Kristin [name] MRN: [id number] DOB: January 06, 1941   Cancelled Treatment:    Reason Eval/Treat Not Completed: Patient declined, no reason specified.  Per RN, pt has been up about 6 hours today.  Pt asks to defer due to worn out.  We see as able 3/29. 07/25/2020  [name], PT Acute Rehabilitation Services [phone]  (pager) [phone]  (office)   [name] 07/25/2020, 5:20 PM

## 2020-07-25 NOTE — Progress Notes (Signed)
NAME:  Kristin [name], MRN:  [id number], DOB:  04-Apr-1941, LOS: 5 ADMISSION DATE:  07/20/2020, CONSULTATION DATE:  07/25/2020 REFERRING MD:  Orvan Seen - TCTS, CHIEF COMPLAINT:  delirium   History of Present Illness:  80 year old woman who was delirious following urgent CABG following NSTEMI  Pertinent  Medical History  Presented on 3/23 to [facility] with RSCP radiating to left arm on background of crescendo DOE for the past few months. Found to have 3V CAD with distal LM occlusion.   Significant Hospital Events: Including procedures, antibiotic start and stop dates in addition to other pertinent events   . 3/23 transfer from [facility] - 3V CAD with LM occlusion. EF 45%. IABP placed.  . 3/24 underwent CABGx5 LIMA to LAD, SVG to OM and D1, SVG PL PDA . 3/25 re-exploration for regional tamponade . 3/26 Extubated  . Delirium requiring Precedex  Interim History / Subjective:  Calmer today. Agitation apparently worse at night time. Patient denies pain.   Objective   Blood pressure 136/65, pulse 89, temperature 98.1 F (36.7 C), temperature source Oral, resp. rate 18, height '5\' 6"'$  (1.676 m), weight 89.9 kg, SpO2 92 %. CVP:  [7 mmHg-30 mmHg] 8 mmHg      Intake/Output Summary (Last 24 hours) at 07/25/2020 0837 Last data filed at 07/25/2020 0600 Gross per 24 hour  Intake 970.15 ml  Output 2925 ml  Net -1954.85 ml   Filed Weights   07/21/20 0601 07/24/20 0858 07/25/20 0500  Weight: 78.1 kg 87.7 kg 89.9 kg    Examination: General: moderately obese woman, appears stated age and sitting calmly in chair. HENT: no icterus. EOM normal  Lungs: chest clear bilaterally. On room air Chest: normal chest excursion. Clean midline sternotomy with no tenderness. No sternal click. Minimal chest tube output.  Cardiovascular: no JVD, HS normal, no rub. Paced rhythm. Abdomen: Soft.  Extremities: Extremities are warm. Trace edema.  Neuro: awake and alert, oriented, understands why she is here. No focal deficits.  GU:  concentrated urine, Foley catheter still in place.   Labs/imaging that I havepersonally reviewed  (right click and "Reselect all SmartList Selections" daily)  Creatinine is improving.  Platelet count is improving.   Resolved Hospital Problem list   Cardiac tamponade Acute respiratory failure   Assessment & Plan:  CAD status post CABG Delirium with sundowning.  AKI Remains critically ill due to Ischemic cardiomyopathy with moderate LV dysfunction requiring titration of milrinone and NE  Critically ill due to severe bradycardia requiring pacing. Overweight. HTN Diabetes type 2 Thrombocytopenia due to IABP and recent CPB  Plan:  - Overall appears to have turned the corner. - Seroquel at bedtime to prevent agitated delirium - if does well tonight can consider transfer to floor which might also reduce risk for further delirum - AKI improving - continue judicious diuresis per HF, although patient is currently on room air, so shouldn't require much more diuresis. - Wean off milrinone, then NE as tolerated  - Initiate secondary cardiac prevention - would recommend stopping Actos and starting metformin and GLP-1  - Observe for spontaneous HR recovery - hold on beta-blocker initiation.    Best practice (right click and "Reselect all SmartList Selections" daily)  Diet:  Oral Pain/Anxiety/Delirium protocol (if indicated): No Seroquel qhs VAP protocol (if indicated): Not indicated DVT prophylaxis: SCD Lovenox.  GI prophylaxis: N/A Glucose control:  SSI Yes Central venous access:  Yes, and it is still needed until pressors off.  Arterial line:  N/A Foley:  Yes, and it is no longer needed Mobility:  OOB  PT consulted: Yes Last date of multidisciplinary goals of care discussion [per TCTS] Code Status:  full code Disposition: ICU  Labs   CBC: Recent Labs  Lab 07/22/20 0404 07/22/20 0422 07/22/20 1700 07/23/20 0300 07/23/20 0434 07/24/20 0315 07/25/20 0257  WBC 8.6  --  9.3  9.7  --  10.5 8.3  HGB 11.5*   < > 10.9* 9.6* 8.5* 8.6* 8.6*  HCT 35.0*   < > 31.1* 28.0* 25.0* 26.5* 26.9*  MCV 95.1  --  88.1 90.6  --  95.3 96.4  PLT 70*  --  52* 47*  --  50* 64*   < > = values in this interval not displayed.    Basic Metabolic Panel: Recent Labs  Lab 07/20/20 1538 07/21/20 0442 07/22/20 0404 07/22/20 0422 07/22/20 1700 07/23/20 0300 07/23/20 0434 07/24/20 0315 07/25/20 0257  NA  --    < > 150*   < > 150* 150* 152* 148* 145  K  --    < > 4.0   < > 3.6 3.4* 3.4* 3.4* 3.4*  CL  --    < > 111  --  114* 113*  --  113* 111  CO2  --    < > 20*  --  27 27  --  29 30  GLUCOSE  --    < > 124*  --  148* 139*  --  170* 96  BUN  --    < > 19  --  24* 28*  --  36* 35*  CREATININE  --    < > 1.70*  --  1.91* 1.90*  --  2.01* 1.77*  CALCIUM  --    < > 7.5*  --  8.3* 8.3*  --  7.9* 7.8*  MG 1.9  --  2.5*  --  2.4  --   --   --   --    < > = values in this interval not displayed.   GFR: Estimated Creatinine Clearance: 29.6 mL/min (A) (by C-G formula based on SCr of 1.77 mg/dL (H)). Recent Labs  Lab 07/22/20 1700 07/23/20 0300 07/24/20 0315 07/25/20 0257  WBC 9.3 9.7 10.5 8.3    Liver Function Tests: No results for input(s): AST, ALT, ALKPHOS, BILITOT, PROT, ALBUMIN in the last 168 hours. No results for input(s): LIPASE, AMYLASE in the last 168 hours. No results for input(s): AMMONIA in the last 168 hours.  ABG    Component Value Date/Time   PHART 7.442 07/23/2020 0434   PCO2ART 42.7 07/23/2020 0434   PO2ART 68 (L) 07/23/2020 0434   HCO3 29.2 (H) 07/23/2020 0434   TCO2 31 07/23/2020 0434   ACIDBASEDEF 1.0 07/22/2020 0814   O2SAT 65.1 07/25/2020 0257     Coagulation Profile: Recent Labs  Lab 07/21/20 1602  INR 1.6*    Cardiac Enzymes: No results for input(s): CKTOTAL, CKMB, CKMBINDEX, TROPONINI in the last 168 hours.  HbA1C: Hgb A1c MFr Bld  Date/Time Value Ref Range Status  07/20/2020 03:38 PM 8.8 (H) 4.8 - 5.6 % Final    Comment:     (NOTE) Pre diabetes:          5.7%-6.4%  Diabetes:              >6.4%  Glycemic control for   <7.0% adults with diabetes     CBG: Recent Labs  Lab 07/24/20 1134 07/24/20 1552 07/24/20 1957 07/24/20 2343  07/25/20 0326  GLUCAP 166* 135* 99 86 97   CRITICAL CARE Performed by: Kipp Brood   Total critical care time: 40 minutes  Critical care time was exclusive of separately billable procedures and treating other patients.  Critical care was necessary to treat or prevent imminent or life-threatening deterioration.  Critical care was time spent personally by me on the following activities: development of treatment plan with patient and/or surrogate as well as nursing, discussions with consultants, evaluation of patient's response to treatment, examination of patient, obtaining history from patient or surrogate, ordering and performing treatments and interventions, ordering and review of laboratory studies, ordering and review of radiographic studies, pulse oximetry, re-evaluation of patient's condition and participation in multidisciplinary rounds.  Kipp Brood, MD [facility] ICU Physician [place]  Pager: [phone] Mobile: [phone] After hours: [phone].

## 2020-07-25 NOTE — Progress Notes (Signed)
Subjective:  Patient denies any chest pain nor shortness of breath, more alert and awake.  Remains pacemaker dependent.  Objective:  Vital Signs in the last 24 hours: Temp:  [96.62 F (35.9 C)-100 F (37.8 C)] 97.9 F (36.6 C) (03/28 1112) Pulse Rate:  [87-91] 88 (03/28 1145) Resp:  [13-31] 20 (03/28 1145) BP: (97-146)/(44-102) 129/62 (03/28 1145) SpO2:  [89 %-98 %] 98 % (03/28 1145) Weight:  [89.9 kg] 89.9 kg (03/28 0500)  Intake/Output from previous day: 03/27 0701 - 03/28 0700 In: 1620.7 [P.O.:460; I.V.:1160.7] Out: 2995 [Urine:2555; Chest Tube:440] Intake/Output from this shift: Total I/O In: -  Out: [id number] [Urine:1550; Stool:1]  Physical Exam: Neck: no adenopathy, no carotid bruit, no JVD and supple, symmetrical, trachea midline Lungs: decreased breath sounds at bases with left basilar rhonchi noted Heart: regular rate and rhythm and S1, S2 normal Abdomen: soft, non-tender; bowel sounds normal; no masses,  no organomegaly Extremities: extremities normal, atraumatic, no cyanosis or edema  Lab Results: Recent Labs    07/24/20 0315 07/25/20 0257  WBC 10.5 8.3  HGB 8.6* 8.6*  PLT 50* 64*   Recent Labs    07/24/20 0315 07/25/20 0257  NA 148* 145  K 3.4* 3.4*  CL 113* 111  CO2 29 30  GLUCOSE 170* 96  BUN 36* 35*  CREATININE 2.01* 1.77*   No results for input(s): TROPONINI in the last 72 hours.  Invalid input(s): CK, MB Hepatic Function Panel No results for input(s): PROT, ALBUMIN, AST, ALT, ALKPHOS, BILITOT, BILIDIR, IBILI in the last 72 hours. No results for input(s): CHOL in the last 72 hours. No results for input(s): PROTIME in the last 72 hours.  Imaging: Imaging results have been reviewed and DG Chest Port 1 View  Result Date: 07/24/2020 CLINICAL DATA:  Pleural effusion EXAM: PORTABLE CHEST 1 VIEW COMPARISON:  07/22/2020 FINDINGS: Interval extubation and removal of right IJ Swan-Ganz catheter. Two left chest tubes and mediastinal drain. No pneumothorax  is seen. Small layering right pleural effusion. Associated mild right lower lobe atelectasis. Cardiomegaly.  Postsurgical changes related to prior CABG. Median sternotomy.  Right IJ venous sheath. IMPRESSION: Two left chest tubes and mediastinal drain. No pneumothorax is seen. Small layering right pleural effusion. Associated mild right lower lobe atelectasis. Interval extubation. Additional support apparatus as above. Electronically Signed   By: Julian Hy M.D.   On: 07/24/2020 07:26    Cardiac Studies:  Assessment/Plan:  Acute non-Q wave myocardial infarction status post left cardiac catheterization noted to have three-vessel coronary artery disease status post CABG x5 complicated postoperatively by cardiac tamponade requiring evacuation of the hematomapostop day 4 doing well Status post acute respiratory failure status post extubation tolerating well on nasal cannula Status post transient asystole now pacemaker dependent Hypertension Diabetes mellitus Postop anemia Thrombocytopenia Acute on chronic kidney injury improved Morbid obesity CCU psychosis Plan Continue present management. Out of bed to chair as tolerated. Incentive spirometry.   LOS: 5 days    Charolette Forward 07/25/2020, 12:23 PM

## 2020-07-25 NOTE — Progress Notes (Signed)
  Speech Language Pathology Treatment: Dysphagia  Patient Details Name: Kristin [name] MRN: [id number] DOB: November 12, 1940 Today's Date: 07/25/2020 Time: [id number] SLP Time Calculation (min) (ACUTE ONLY): 17 min  Assessment / Plan / Recommendation Clinical Impression  Pt noted to have improved mentation during today's treatment. Coughing occured with thin liquids via straw but appeared to be alleviated with cups sips. No coughing was noted with more solids textures but trials were limited to smaller bites due to pt's fear of potentially choking. Recommend DYS 2 with thin liquids via cup (No straw). Pt agreed this would be the most appropriate diet given that she is nervous about choking. She is also requesting full supervision during mealtimes. SLP will continue to follow to assess her oropharyngeal swallow.    HPI HPI: Pt is a 80 yo female admitted with NSTEMI, cadiogenic shock s/p emergent CABG x5 on 3/24. She returned to the OR on 3/25 for tamponade requiring evacuation of hematoma. ETT 3/24-3/26. PMH includes DM, obesity, HTN, HL      SLP Plan  Continue with current plan of care       Recommendations  Diet recommendations: Dysphagia 2 (fine chop);Thin liquid Liquids provided via: Cup;No straw Medication Administration: Whole meds with puree (Crushed if larger) Supervision: Staff to assist with self feeding;Full supervision/cueing for compensatory strategies Compensations: Slow rate;Small sips/bites Postural Changes and/or Swallow Maneuvers: Seated upright 90 degrees                Oral Care Recommendations: Oral care BID Follow up Recommendations: Skilled Nursing facility;24 hour supervision/assistance SLP Visit Diagnosis: Dysphagia, unspecified (R13.10) Plan: Continue with current plan of care       GO                Jeanine Luz., SLP Student 07/25/2020, 11:05 AM

## 2020-07-25 NOTE — Consult Note (Addendum)
Cardiology Consultation:   Patient ID: Kristin [name] MRN: [id number]; DOB: 09-Jun-1940  Admit date: 07/20/2020 Date of Consult: 07/25/2020  PCP:  Ernestene Kiel, MD   [place]  Cardiologist:  Dr. Terrence Dupont AHF: Dr. Haroldine Laws  Patient Profile:   Kristin [name] is a 80 y.o. female with a hx of DM, obesity, HTN admitted with NSTEMI > LHC noted critical LM and Multivessel CAD > IABP > emergent CABG 07/22/20 (x5 (LIMA to LAD, SEQ SVG to OM and Diagonal, SEQ to PL and PDA) who is being seen today for the evaluation of persistent post op CHB at the request of [name].  History of Present Illness:   Kristin [name] dmitted with NSTEMI > LHC noted critical LM disease > IABP > emergent CABG 07/21/20,  Post op shock with suspect tamponade, TEE confirmed  Post op with tamponade and brought back to the OR 07/22/20  Extubated 07/22/20, epi 3, milrinone 0.125, and VP 0.02 3/26 very agitated, remained on NE 3, milrinone 0.125 and precedex 07/24/20 remained confused Today CO-OX 65%. Norepi 4 mcg + milrinone 0.125 mcg Na lower 145      Plts 47K => 50=>64K.  Creatinine 1.1 -> 1.4 -> 1.7 -> 1.9 -> 2.01->1.7  Planned to slowly wean NE, stopping milrinone  She remains with no underlying conduction this AM and EP is asked to come on board.  LABS [place] 65 K+ 3.4 BUN/Creat 35/1.77 WBC 8.3 H/H 8.6/26.9 Plts 64  Off milrinone > midodrine started yesterday Remains on Nor-epi AV pacing via epicardial wires   Past Medical History:  Diagnosis Date   Depression    Diabetes mellitus without complication (HCC)    Hypertension    Night sweats     Past Surgical History:  Procedure Laterality Date   CORONARY ARTERY BYPASS GRAFT N/A 07/21/2020   Procedure: CORONARY ARTERY BYPASS GRAFTING (CABG), TIMES FIVE, USING LEFT INTERNAL MAMMARY ARTERY AND ENDOSCOPICALLY HARVESTED BILATERAL GREATER SAPHENOUS VEINS;  Surgeon: [name], MD;  Location: [facility];  Service: Open  Heart Surgery;  Laterality: N/A;   ENDOVEIN HARVEST OF GREATER SAPHENOUS VEIN N/A 07/21/2020   Procedure: ENDOVEIN HARVEST OF BILATERAL GREATER SAPHENOUS VEINS;  Surgeon: [name], MD;  Location: [place];  Service: Open Heart Surgery;  Laterality: N/A;   EYE SURGERY     GALLBLADDER SURGERY     IABP INSERTION N/A 07/21/2020   Procedure: IABP Insertion;  Surgeon: Charolette Forward, MD;  Location: [place] [facility];  Service: Cardiovascular;  Laterality: N/A;   LEFT HEART CATH AND CORONARY ANGIOGRAPHY N/A 07/21/2020   Procedure: LEFT HEART CATH AND CORONARY ANGIOGRAPHY;  Surgeon: Charolette Forward, MD;  Location: [place] [facility];  Service: Cardiovascular;  Laterality: N/A;   TEE WITHOUT CARDIOVERSION N/A 07/21/2020   Procedure: TRANSESOPHAGEAL ECHOCARDIOGRAM (TEE);  Surgeon: [name], MD;  Location: [facility];  Service: Open Heart Surgery;  Laterality: N/A;   toenail surgery     both big toenails     Home Medications:  Prior to Admission medications   Medication Sig Start Date End Date Taking? Authorizing Provider  albuterol (VENTOLIN HFA) 108 (90 Base) MCG/ACT inhaler Inhale 2 puffs into the lungs every 6 (six) hours as needed for wheezing or shortness of breath.   Yes [provider]  Alcohol Swabs (ALCOHOL PREP) 70 % PADS  07/20/13  Yes [provider]  amLODipine (NORVASC) 5 MG tablet Take 5 mg by mouth daily.   Yes [provider]  ASSURE COMFORT LANCETS 30G MISC  07/20/13  Yes [provider]  b complex vitamins tablet Take 1 tablet by mouth daily.   Yes [provider]  Blood Glucose Monitoring Suppl (EMBRACE BLOOD GLUCOSE MONITOR) DEVI  07/20/13  Yes [provider]  buPROPion (WELLBUTRIN XL) 150 MG 24 hr tablet  08/05/14  Yes [provider]  cholecalciferol (VITAMIN D) 1000 UNITS tablet Take 1,000 Units by mouth daily.   Yes [provider]  COMFORT EZ PEN NEEDLES 31G X 8 MM [place]  07/20/13  Yes [provider]  [facility] BLOOD GLUCOSE TEST test strip  07/20/13  Yes [provider]  glimepiride (AMARYL) 1 MG tablet Take 1 mg by mouth every morning. 06/04/20  Yes [provider]  LEVEMIR FLEXTOUCH 100 UNIT/ML FlexPen Inject 42 Units into the skin daily. 05/25/20  Yes [provider]  Multiple Vitamins-Minerals (ZINC PO) Take 1 tablet by mouth daily.   Yes [provider]  pioglitazone (ACTOS) 30 MG tablet Take 30 mg by mouth daily. 07/15/20  Yes [provider]  rosuvastatin (CRESTOR) 40 MG tablet Take 40 mg by mouth daily. 07/15/20  Yes [provider]  topiramate (TOPAMAX) 50 MG tablet Take 50 mg by mouth daily. 08/05/14  Yes [provider]  trimethoprim (TRIMPEX) 100 MG tablet Take 100 mg by mouth daily. 06/11/20  Yes [provider]    Inpatient Medications: Scheduled Meds:  acetaminophen  1,000 mg Oral Q6H   Or   acetaminophen (TYLENOL) oral liquid 160 mg/5 mL  1,000 mg Per Tube Q6H   aspirin EC  81 mg Oral Daily   atorvastatin  80 mg Oral q1800   bisacodyl  10 mg Oral Daily   Or   bisacodyl  10 mg Rectal Daily   Chlorhexidine Gluconate Cloth  6 each Topical Daily   clopidogrel  75 mg Oral Daily   docusate sodium  200 mg Oral Daily   enoxaparin (LOVENOX) injection  30 mg Subcutaneous QHS   furosemide  80 mg Intravenous BID   insulin aspart  0-24 Units Subcutaneous Q4H   insulin detemir  10 Units Subcutaneous Daily   mouth rinse  15 mL Mouth Rinse BID   melatonin  3 mg Oral QHS   midodrine  5 mg Oral TID WC   pantoprazole  40 mg Oral Daily   polyethylene glycol  17 g Oral Daily   potassium chloride  40 mEq Oral Q6H   QUEtiapine  50 mg Oral QHS   sodium chloride flush  3 mL Intravenous Q12H   sorbitol  30 mL Oral Daily   white petrolatum       Continuous Infusions:  sodium chloride     lactated ringers     norepinephrine (LEVOPHED) Adult infusion 4 mcg/min (07/24/20 2000)   PRN Meds: dextrose,  lactated ringers, metoprolol tartrate, morphine injection, ondansetron (ZOFRAN) IV, oxyCODONE, sodium chloride flush, traMADol  Allergies:    Allergies  Allergen Reactions   Sulfa Antibiotics Hives    Social History:   Social History   Socioeconomic History   Marital status: Widowed    Spouse name: Not on file   Number of children: Not on file   Years of education: Not on file   Highest education level: Not on file  Occupational History   Occupation: retired   Tobacco Use   Smoking status: Never Smoker   Smokeless tobacco: Never Used  Scientific laboratory technician Use: Never  used  Substance and Sexual Activity   Alcohol use: No   Drug use: No   Sexual activity: Not on file  Other Topics Concern   Not on file  Social History Narrative   Not on file   Social Determinants of Health   Financial Resource Strain: Not on file  Food Insecurity: Not on file  Transportation Needs: Not on file  Physical Activity: Not on file  Stress: Not on file  Social Connections: Not on file  Intimate Partner Violence: Not on file    Family History:   Family History  Problem Relation Age of Onset   Diabetes Mother    Heart attack Mother    Heart attack Father      ROS:  Please see the history of present illness.  All other ROS reviewed and negative.     Physical Exam/Data:   Vitals:   07/25/20 0830 07/25/20 0845 07/25/20 0900 07/25/20 0915  BP: 131/67 124/62 119/68 (!) 146/66  Pulse: 88 89 90 89  Resp: 20 (!) 22 (!) 25 20  Temp:      TempSrc:      SpO2: 94% 93% 92% 93%  Weight:      Height:        Intake/Output Summary (Last 24 hours) at 07/25/2020 1015 Last data filed at 07/25/2020 0950 Gross per 24 hour  Intake 640.92 ml  Output 3300 ml  Net -2659.08 ml   Last 3 Weights 07/25/2020 07/24/2020 07/21/2020  Weight (lbs) 198 lb 3.1 oz 193 lb 5.5 oz 172 lb 2.9 oz  Weight (kg) 89.9 kg 87.7 kg 78.1 kg     Body mass index is 31.99 kg/m.  General:  Well nourished, well developed, in  no acute distress HEENT: normal Lymph: no adenopathy Neck: no JVD Endocrine:  No thryomegaly Vascular: No carotid bruits Cardiac:  RRR; no murmurs, gallops or rubs Lungs:  CTA b/l, no wheezing, rhonchi or rales  Abd: soft, nontender Ext: no edema Musculoskeletal:  No deformities Skin: warm and dry  Neuro:  No gross focal abnormalities noted, she is pleasant, AAO x3 this am Psych:  Normal affect   EKG:  The EKG was personally reviewed and demonstrates:    #1 SR 85, LBBB #2 AV paced  Telemetry:  Telemetry was personally reviewed and demonstrates:   AV pacing, CHB underlying this AM   Relevant CV Studies:   07/22/20: TEE LEFT VENTRICLE: EF = 40-45%.The LV has marked LVH and is underfilled.There is external compression of the LV from a fluid collection posterior an lateral to the heart.  RIGHT VENTRICLE: Moderate HK + swan LEFT ATRIUM: Dilated. Mild external compression  LEFT ATRIAL APPENDAGE: Not visualized RIGHT ATRIUM: Dialted AORTIC VALVE:  Trileaflet. No AI/AS MITRAL VALVE:    Normal. Trivial MR TRICUSPID VALVE: Normal. Trivial TR PULMONIC VALVE: Normal INTERATRIAL SEPTUM: Not well visualized PERICARDIUM: Large echodense collection posterior and lateral to heart.  DESCENDING AORTA: Not visualized   07/21/20: LHC Mid LM lesion is 95% stenosed. Ost Cx lesion is 99% stenosed. Dist LM to Ost LAD lesion is 95% stenosed. Mid LAD lesion is 95% stenosed. Prox Cx lesion is 90% stenosed. Prox RCA to Mid RCA lesion is 80% stenosed. RPDA lesion is 80% stenosed. RPAV lesion is 80% stenosed. Ost LM lesion is 30% stenosed.      07/20/20; TTE IMPRESSIONS   1. Left ventricular ejection fraction, by estimation, is 40 to 45%. The  left ventricle has mild to moderately decreased function. The left  ventricle demonstrates regional wall motion abnormalities (see scoring  diagram/findings for description). Left  ventricular diastolic parameters are consistent with Grade I  diastolic  dysfunction (impaired relaxation).   2. Right ventricular systolic function is normal. The right ventricular  size is normal.   3. The mitral valve is normal in structure. Mild mitral valve  regurgitation.   4. The aortic valve is calcified. Aortic valve regurgitation is not  visualized. Mild aortic valve sclerosis is present, with no evidence of  aortic valve stenosis.   5. The inferior vena cava is normal in size with <50% respiratory  variability, suggesting right atrial pressure of 8 mmHg.     Laboratory Data:  High Sensitivity Troponin:   Recent Labs  Lab 07/20/20 1538 07/20/20 1729  TROPONINIHS 3,198* 3,282*     Chemistry Recent Labs  Lab 07/23/20 0300 07/23/20 0434 07/24/20 0315 07/25/20 0257  NA 150* 152* 148* 145  K 3.4* 3.4* 3.4* 3.4*  CL 113*  --  113* 111  CO2 27  --  29 30  GLUCOSE 139*  --  170* 96  BUN 28*  --  36* 35*  CREATININE 1.90*  --  2.01* 1.77*  CALCIUM 8.3*  --  7.9* 7.8*  GFRNONAA 27*  --  25* 29*  ANIONGAP 10  --  6 4*    No results for input(s): PROT, ALBUMIN, AST, ALT, ALKPHOS, BILITOT in the last 168 hours. Hematology Recent Labs  Lab 07/23/20 0300 07/23/20 0434 07/24/20 0315 07/25/20 0257  WBC 9.7  --  10.5 8.3  RBC 3.09*  --  2.78* 2.79*  HGB 9.6* 8.5* 8.6* 8.6*  HCT 28.0* 25.0* 26.5* 26.9*  MCV 90.6  --  95.3 96.4  MCH 31.1  --  30.9 30.8  MCHC 34.3  --  32.5 32.0  RDW 17.0*  --  17.3* 16.7*  PLT 47*  --  50* 64*   BNPNo results for input(s): BNP, PROBNP in the last 168 hours.  DDimer No results for input(s): DDIMER in the last 168 hours.   Radiology/Studies:  DG Chest Port 1 View Result Date: 07/24/2020 CLINICAL DATA:  Pleural effusion EXAM: PORTABLE CHEST 1 VIEW COMPARISON:  07/22/2020 FINDINGS: Interval extubation and removal of right IJ Swan-Ganz catheter. Two left chest tubes and mediastinal drain. No pneumothorax is seen. Small layering right pleural effusion. Associated mild right lower lobe  atelectasis. Cardiomegaly.  Postsurgical changes related to prior CABG. Median sternotomy.  Right IJ venous sheath. IMPRESSION: Two left chest tubes and mediastinal drain. No pneumothorax is seen. Small layering right pleural effusion. Associated mild right lower lobe atelectasis. Interval extubation. Additional support apparatus as above. Electronically Signed   By: Julian Hy M.D.   On: 07/24/2020 07:26     Assessment and Plan:   1. P/o CHB     Today is POD #4 CABG                    POD #3 re-exploration/tamponad     No nodal blockers on board     Still on a little nor-epi     CT still in (perhaps out today)  She is AV pacing via epicardial wires. would like to see the CT out and off pressors prior to PPM implant LVEF 40-45%, baseline LBBB Likely CRT-P  EP MD to see later today   Risk Assessment/Risk Scores:  {  For questions or updates, please contact Earl Please consult [email or web address] for contact info under  Signed, Baldwin Jamaica, PA-C  07/25/2020 10:15 AM  EP Attending  Patient seen and examined. Agree with above. We have turned down her PPM and she has persistent heart block and an escape in the 50's. I have recommended she leave her epicardial PM in the 30's. Hopefully her conduction will improve. If not PPM insertion in the next day or two.  Carleene Overlie Lizzett Nobile,MD

## 2020-07-26 ENCOUNTER — Inpatient Hospital Stay (HOSPITAL_COMMUNITY): Payer: Medicare HMO

## 2020-07-26 ENCOUNTER — Encounter (HOSPITAL_COMMUNITY): Payer: Self-pay | Admitting: Cardiothoracic Surgery

## 2020-07-26 DIAGNOSIS — I314 Cardiac tamponade: Secondary | ICD-10-CM | POA: Diagnosis not present

## 2020-07-26 DIAGNOSIS — Z951 Presence of aortocoronary bypass graft: Secondary | ICD-10-CM | POA: Diagnosis not present

## 2020-07-26 DIAGNOSIS — I442 Atrioventricular block, complete: Secondary | ICD-10-CM | POA: Diagnosis not present

## 2020-07-26 DIAGNOSIS — I214 Non-ST elevation (NSTEMI) myocardial infarction: Secondary | ICD-10-CM | POA: Diagnosis not present

## 2020-07-26 LAB — COMPREHENSIVE METABOLIC PANEL
ALT: 938 U/L — ABNORMAL HIGH (ref 0–44)
AST: 321 U/L — ABNORMAL HIGH (ref 15–41)
Albumin: 2.5 g/dL — ABNORMAL LOW (ref 3.5–5.0)
Alkaline Phosphatase: 94 U/L (ref 38–126)
Anion gap: 6 (ref 5–15)
BUN: 30 mg/dL — ABNORMAL HIGH (ref 8–23)
CO2: 28 mmol/L (ref 22–32)
Calcium: 7.3 mg/dL — ABNORMAL LOW (ref 8.9–10.3)
Chloride: 111 mmol/L (ref 98–111)
Creatinine, Ser: 1.44 mg/dL — ABNORMAL HIGH (ref 0.44–1.00)
GFR, Estimated: 37 mL/min — ABNORMAL LOW (ref >60)
Glucose, Bld: 122 mg/dL — ABNORMAL HIGH (ref 70–99)
Potassium: 3.2 mmol/L — ABNORMAL LOW (ref 3.5–5.1)
Sodium: 145 mmol/L (ref 135–145)
Total Bilirubin: 1.3 mg/dL — ABNORMAL HIGH (ref 0.3–1.2)
Total Protein: 4.2 g/dL — ABNORMAL LOW (ref 6.5–8.1)

## 2020-07-26 LAB — GLUCOSE, CAPILLARY
Glucose-Capillary: 125 mg/dL — ABNORMAL HIGH (ref 70–99)
Glucose-Capillary: 176 mg/dL — ABNORMAL HIGH (ref 70–99)
Glucose-Capillary: 220 mg/dL — ABNORMAL HIGH (ref 70–99)
Glucose-Capillary: 242 mg/dL — ABNORMAL HIGH (ref 70–99)
Glucose-Capillary: 288 mg/dL — ABNORMAL HIGH (ref 70–99)
Glucose-Capillary: 327 mg/dL — ABNORMAL HIGH (ref 70–99)

## 2020-07-26 LAB — CBC
HCT: 27.8 % — ABNORMAL LOW (ref 36.0–46.0)
Hemoglobin: 9.1 g/dL — ABNORMAL LOW (ref 12.0–15.0)
MCH: 31.6 pg (ref 26.0–34.0)
MCHC: 32.7 g/dL (ref 30.0–36.0)
MCV: 96.5 fL (ref 80.0–100.0)
Platelets: 79 10*3/uL — ABNORMAL LOW (ref 150–400)
RBC: 2.88 MIL/uL — ABNORMAL LOW (ref 3.87–5.11)
RDW: 16.7 % — ABNORMAL HIGH (ref 11.5–15.5)
WBC: 7.5 10*3/uL (ref 4.0–10.5)
nRBC: 0.9 % — ABNORMAL HIGH (ref 0.0–0.2)

## 2020-07-26 LAB — POTASSIUM: Potassium: 4.9 mmol/L (ref 3.5–5.1)

## 2020-07-26 LAB — COOXEMETRY PANEL
Carboxyhemoglobin: 1.7 % — ABNORMAL HIGH (ref 0.5–1.5)
Methemoglobin: 1.1 % (ref 0.0–1.5)
O2 Saturation: 59.9 %
Total hemoglobin: 13.5 g/dL (ref 12.0–16.0)

## 2020-07-26 LAB — BASIC METABOLIC PANEL
Anion gap: 7 (ref 5–15)
BUN: 34 mg/dL — ABNORMAL HIGH (ref 8–23)
CO2: 29 mmol/L (ref 22–32)
Calcium: 8 mg/dL — ABNORMAL LOW (ref 8.9–10.3)
Chloride: 106 mmol/L (ref 98–111)
Creatinine, Ser: 1.72 mg/dL — ABNORMAL HIGH (ref 0.44–1.00)
GFR, Estimated: 30 mL/min — ABNORMAL LOW (ref >60)
Glucose, Bld: 272 mg/dL — ABNORMAL HIGH (ref 70–99)
Potassium: 7 mmol/L (ref 3.5–5.1)
Sodium: 142 mmol/L (ref 135–145)

## 2020-07-26 LAB — MAGNESIUM: Magnesium: 1.8 mg/dL (ref 1.7–2.4)

## 2020-07-26 MED ORDER — MAGNESIUM SULFATE 2 GM/50ML IV SOLN
2.0000 g | Freq: Once | INTRAVENOUS | Status: AC
  Administered 2020-07-26: 2 g via INTRAVENOUS
  Filled 2020-07-26: qty 50

## 2020-07-26 MED ORDER — POTASSIUM CHLORIDE CRYS ER 20 MEQ PO TBCR
20.0000 meq | EXTENDED_RELEASE_TABLET | ORAL | Status: DC
  Administered 2020-07-26: 20 meq via ORAL
  Filled 2020-07-26: qty 1

## 2020-07-26 MED ORDER — POTASSIUM CHLORIDE 10 MEQ/50ML IV SOLN
10.0000 meq | INTRAVENOUS | Status: AC
  Administered 2020-07-26 (×3): 10 meq via INTRAVENOUS
  Filled 2020-07-26 (×3): qty 50

## 2020-07-26 MED ORDER — PNEUMOCOCCAL VAC POLYVALENT 25 MCG/0.5ML IJ INJ
0.5000 mL | INJECTION | INTRAMUSCULAR | Status: AC
  Administered 2020-07-29: 0.5 mL via INTRAMUSCULAR
  Filled 2020-07-26: qty 0.5

## 2020-07-26 MED ORDER — MEGESTROL ACETATE 40 MG PO TABS
40.0000 mg | ORAL_TABLET | Freq: Every day | ORAL | Status: DC
  Administered 2020-07-26 – 2020-08-10 (×16): 40 mg via ORAL
  Filled 2020-07-26 (×16): qty 1

## 2020-07-26 MED ORDER — MAGIC MOUTHWASH
15.0000 mL | Freq: Three times a day (TID) | ORAL | Status: DC | PRN
  Administered 2020-07-26 – 2020-07-27 (×2): 15 mL via ORAL
  Filled 2020-07-26 (×3): qty 15

## 2020-07-26 MED ORDER — POTASSIUM CHLORIDE CRYS ER 20 MEQ PO TBCR
40.0000 meq | EXTENDED_RELEASE_TABLET | ORAL | Status: AC
  Administered 2020-07-26 (×2): 40 meq via ORAL
  Filled 2020-07-26 (×2): qty 2

## 2020-07-26 NOTE — Evaluation (Signed)
Physical Therapy Evaluation Patient Details Name: Kristin [name] MRN: [id number] DOB: 1940-08-21 Today's Date: 07/26/2020   History of Present Illness  [name] is a 80 y/o female admitted 3/23 from [facility] with emergent cath then to ICU on 07/21/20. Pt with NSTEMI and underwent CABGx5 on 07/22/20. Post-op tamponade requiring return to OR on 3/25 for hematoma evacuation and then extubated on 3/26. PMH includes DM,HTN, HLD, and history of gallbladder surgery.    Clinical Impression  Pt presented with above diagnosis and subsequent problems. Reviewed sternal precautions with pt. Pt generally min-mod Ax2 for mobility, needing cueing for sequencing and to maintain RW at appropriate distance. Pt reported dizziness following stand pivot transfer to [facility]. Pt found to be hypotensive and bradycardic, which limited gait distance. Pt would benefit from PT to address balance, increase endurance, and increase independency. Will continue to follow acutely.  BP sitting in recliner = 92/42, MAP = 58, HR = 86 BP following stand pivot transfer to [facility] = 96/49, MAP = 64 BP following return to bed at 0 min = 94/40, MAP -53, BP following return to bed at 8 min = 100/40, MAP = 58, HR = 42  SPO2 = 96-98% on RA    Follow Up Recommendations CIR;Supervision for mobility/OOB    Equipment Recommendations  Rolling walker with 5" wheels;3in1 (PT)    Recommendations for Other Services Rehab consult     Precautions / Restrictions Precautions Precautions: Sternal;Fall;Other (comment) Precaution Booklet Issued: Yes (comment) Precaution Comments: external pacer Restrictions Weight Bearing Restrictions: No      Mobility  Bed Mobility Overal bed mobility: Needs Assistance Bed Mobility: Sit to Supine       Sit to supine: Mod assist   General bed mobility comments: Mod Ax1 for LE management    Transfers Overall transfer level: Needs assistance Equipment used: Rolling walker (2 wheeled) Transfers: Sit to/from  [facility] Sit to Stand: +2 physical assistance;Mod assist;Min assist Stand pivot transfers: Min assist;+2 physical assistance       General transfer comment: Mod Ax2 to come into standing from chair, min Ax2 from sit to stand from [facility], min Ax for steadying during stand pivot transfer, cueing for sequencing and navigation with RW  Ambulation/Gait Ambulation/Gait assistance: Min assist;+2 safety/equipment Gait Distance (Feet): 4 Feet Assistive device: Rolling walker (2 wheeled) Gait Pattern/deviations: Trunk flexed;Narrow base of support;Step-through pattern;Decreased stride length     General Gait Details: Cueing for navigation with RW, distance limited due to hypotension and bradycardia  Stairs            Wheelchair Mobility    Modified Rankin (Stroke Patients Only)       Balance Overall balance assessment: Needs assistance Sitting-balance support: Feet supported Sitting balance-Leahy Scale: Fair       Standing balance-Leahy Scale: Poor Standing balance comment: Reliant on B UE support and external assistance                             Pertinent Vitals/Pain Pain Assessment: Faces Faces Pain Scale: Hurts a little bit Pain Location: generalized discomfort Pain Descriptors / Indicators: Grimacing;Operative site guarding Pain Intervention(s): Monitored during session;Repositioned    Home Living Family/patient expects to be discharged to:: Private residence Living Arrangements: Alone Available Help at Discharge: Family;Available PRN/intermittently Type of Home: House Home Access: Stairs to enter Entrance Stairs-Rails: Can reach both Entrance Stairs-Number of Steps: 4 Home Layout: One level Home Equipment: Walker - 2 wheels;Cane - single  point;Shower seat      Prior Function Level of Independence: Independent         Comments: Able to walk within home/short distances     Hand Dominance        Extremity/Trunk  Assessment   Upper Extremity Assessment Upper Extremity Assessment: Defer to OT evaluation    Lower Extremity Assessment Lower Extremity Assessment: Generalized weakness    Cervical / Trunk Assessment Cervical / Trunk Assessment: Kyphotic  Communication   Communication: No difficulties  Cognition Arousal/Alertness: Awake/alert Behavior During Therapy: WFL for tasks assessed/performed Overall Cognitive Status: Within Functional Limits for tasks assessed                                 General Comments: A&Ox4. Follows commands consistently with increased time      General Comments      Exercises     Assessment/Plan    PT Assessment Patient needs continued PT services  PT Problem List Decreased strength;Decreased mobility;Decreased coordination;Decreased knowledge of precautions;Decreased knowledge of use of DME;Decreased balance;Decreased activity tolerance       PT Treatment Interventions Gait training;Balance training;Neuromuscular re-education;Stair training;Therapeutic activities;Functional mobility training;Patient/family education;DME instruction;Therapeutic exercise    PT Goals (Current goals can be found in the Care Plan section)  Acute Rehab PT Goals Patient Stated Goal: get stronger, stand easier PT Goal Formulation: With patient Time For Goal Achievement: 08/09/20 Potential to Achieve Goals: Good    Frequency Min 3X/week   Barriers to discharge        Co-evaluation               AM-PAC PT "6 Clicks" Mobility  Outcome Measure Help needed turning from your back to your side while in a flat bed without using bedrails?: A Lot Help needed moving from lying on your back to sitting on the side of a flat bed without using bedrails?: A Lot Help needed moving to and from a bed to a chair (including a wheelchair)?: A Lot Help needed standing up from a chair using your arms (e.g., wheelchair or bedside chair)?: A Lot Help needed to walk in  hospital room?: A Lot Help needed climbing 3-5 steps with a railing? : Total 6 Click Score: 11    End of Session Equipment Utilized During Treatment: Gait belt Activity Tolerance: Patient limited by fatigue;Other (comment) (Limited by hypotension and bradycardia) Patient left: in bed;with call bell/phone within reach;with bed alarm set Nurse Communication: Mobility status PT Visit Diagnosis: Unsteadiness on feet (R26.81);Muscle weakness (generalized) (M62.81)    Time: [id number] PT Time Calculation (min) (ACUTE ONLY): 29 min   Charges:   PT Evaluation $PT Eval High Complexity: 1 High         Rosita Kea, SPT

## 2020-07-26 NOTE — Plan of Care (Signed)

## 2020-07-26 NOTE — Progress Notes (Signed)
Paged Baldwin Jamaica. PA-C. After leaving the room this morning she reports that pt was in VVI back up rate of 40. On assessment pt was in DDD back up rate of 40. Underlying pt was dropping to 37's and pacing and sometimes in the 50's-60's with intermittent pacing. Pt BP began to drop and pt was not feeling well with physical therapy. Pt placed in VVI back up rate of 50 and pt seemed to tolerate this better. BP is improving some although has low diastolic. Systolic [id number] at this time. Notified PA of current status. Awaiting response.

## 2020-07-26 NOTE — Progress Notes (Signed)
  Speech Language Pathology Treatment: Dysphagia  Patient Details Name: Kristin [name] MRN: [id number] DOB: 08-May-1940 Today's Date: 07/26/2020 Time: [id number] SLP Time Calculation (min) (ACUTE ONLY): 19 min  Assessment / Plan / Recommendation Clinical Impression  Pt continues to report a sore throat with facial grimacing as she swallows. She consistently has a second swallow regardless of bolus consistency, and there is also throat clearing noted intermittently throughout all trials. RN reports that they have started a magic mouthwash for her. Other than these symptoms, pt has no other signs of an oropharyngeal dysphagia, and she has no overt coughing as she previously exhibited. Her vocal quality sounds clear without evidence of dysphonia post-extubation. Suspect that her symptoms may be attributable to compensations that she is making secondary to her persistent sore throat. Recommend continuing current diet, as pt is also being very cautious with PO intake, using aspiration precautions well as her mentation has improved. Will continue to follow for tolerance and potential to advance.    HPI HPI: Pt is a 80 yo female admitted with NSTEMI, cadiogenic shock s/p emergent CABG x5 on 3/24. She returned to the OR on 3/25 for tamponade requiring evacuation of hematoma. ETT 3/24-3/26. PMH includes DM, obesity, HTN, HL      SLP Plan  Continue with current plan of care       Recommendations  Diet recommendations: Dysphagia 2 (fine chop);Thin liquid Liquids provided via: Cup;No straw Medication Administration: Whole meds with puree (crush larger ones per pt preference) Supervision: Staff to assist with self feeding;Full supervision/cueing for compensatory strategies Compensations: Slow rate;Small sips/bites Postural Changes and/or Swallow Maneuvers: Seated upright 90 degrees                Oral Care Recommendations: Oral care BID Follow up Recommendations: Skilled Nursing facility;24 hour  supervision/assistance SLP Visit Diagnosis: Dysphagia, unspecified (R13.10) Plan: Continue with current plan of care       GO                Osie Bond., M.A. [place] [facility] Pager [phone] Office [phone]  07/26/2020, 12:53 PM

## 2020-07-26 NOTE — Progress Notes (Signed)
Subjective:  Patient denies any chest pain or shortness of breath.  States feels much better today.  Ventricular paced rhythm on the monitor.  Underlying accelerated junctional rhythm , being followed by EP   Objective:  Vital Signs in the last 24 hours: Temp:  [98.1 F (36.7 C)-98.7 F (37.1 C)] 98.3 F (36.8 C) (03/29 1229) Pulse Rate:  [42-117] 117 (03/29 1200) Resp:  [16-29] 18 (03/29 1200) BP: (92-131)/(38-73) 121/51 (03/29 1200) SpO2:  [91 %-99 %] 98 % (03/29 1200) Weight:  [85.8 kg] 85.8 kg (03/29 0500)  Intake/Output from previous day: 03/28 0701 - 03/29 0700 In: 254.3 [I.V.:204.3; IV Piggyback:50] Out: [id number] [Urine:4595; Stool:3; Chest Tube:160] Intake/Output from this shift: Total I/O In: 366.3 [P.O.:240; IV Piggyback:126.3] Out: 4 [Urine:2; Stool:2]  Physical Exam: Neck: no adenopathy, no carotid bruit, no JVD and supple, symmetrical, trachea midline Lungs: decreased breath sounds at bases Heart: regular rate and rhythm and S1, S2 normal Abdomen: soft, non-tender; bowel sounds normal; no masses,  no organomegaly Extremities: extremities normal, atraumatic, no cyanosis or edema  Lab Results: Recent Labs    07/25/20 0257 07/26/20 0357  WBC 8.3 7.5  HGB 8.6* 9.1*  PLT 64* 79*   Recent Labs    07/25/20 1555 07/26/20 0357  NA 145 145  K 3.4* 3.2*  CL 108 111  CO2 31 28  GLUCOSE 119* 122*  BUN 33* 30*  CREATININE 1.65* 1.44*   No results for input(s): TROPONINI in the last 72 hours.  Invalid input(s): CK, MB Hepatic Function Panel Recent Labs    07/26/20 0357  PROT 4.2*  ALBUMIN 2.5*  AST 321*  ALT 938*  ALKPHOS 94  BILITOT 1.3*   No results for input(s): CHOL in the last 72 hours. No results for input(s): PROTIME in the last 72 hours.  Imaging: Imaging results have been reviewed and DG Chest 1 View  Result Date: 07/26/2020 CLINICAL DATA:  Sore chest.  Status post open heart surgery. EXAM: CHEST  1 VIEW COMPARISON:  07/24/2020. FINDINGS:  Interval removal of 2 left chest tubes. Right IJ sheath in stable position. Prior median sternotomy. Stable cardiomegaly. Low lung volumes with persistent bibasilar atelectasis. Bibasilar infiltrates/edema cannot be excluded. Small right pleural effusion again noted. Tiny left pleural effusion cannot be excluded. No pneumothorax. IMPRESSION: 1. Interval removal of 2 left chest tubes. No pneumothorax. Right IJ sheath in stable position. 2. Prior median sternotomy. Stable cardiomegaly. 3. Low lung volumes with persistent bibasilar atelectasis. Bibasilar infiltrates/edema cannot be excluded. Small right pleural effusion again noted. Tiny left pleural effusion cannot be excluded. Electronically Signed   By: Marcello Moores  Register   On: 07/26/2020 05:53    Cardiac Studies:  Assessment/Plan:  Acute non-Q wave myocardial infarction status post left cardiac catheterization noted to have three-vessel coronary artery disease status post CABG x5 complicated postoperatively by cardiac tamponade requiring evacuation of the hematomapostop day5doing well Status post acute respiratory failure status post extubation tolerating well on nasal cannula Status post transient asystole now pacemaker dependent Hypertension Diabetes mellitus Postop anemia Thrombocytopenia improved Acute on chronic kidney injury improved Morbid obesity Status post CCU psychosis Hypokalemia. Plan Continue present management. Replace K Check  labs in a.m. Encouraged incentive spirometry. Out of Bed to chair as tolerated.   LOS: 6 days    Charolette Forward 07/26/2020, 12:57 PM

## 2020-07-26 NOTE — Progress Notes (Cosign Needed)
Inpatient Rehab Admissions Coordinator:   CIR consult order received. I have reached out to Pt. And her daughter to discuss potential CIR admit and await call back. Pt. Appears to be improving clinically but is not yet medically ready for CIR. I will follow for potential admit later this week.  Clemens Catholic, [place], [place] Admissions Coordinator  [phone] ([place]) [phone] (office)

## 2020-07-26 NOTE — Progress Notes (Addendum)
Progress Note  Patient Name: Kristin [name] Date of Encounter: 07/26/2020  [facility] Cardiologist: Dr. Terrence Dupont  Subjective   Feel very weak today, "my legs feel so heavy", my mouth is so dry  Inpatient Medications    Scheduled Meds:  acetaminophen (TYLENOL) oral liquid 160 mg/5 mL  1,000 mg Oral Q6H   Or   acetaminophen  1,000 mg Oral Q6H   aspirin  81 mg Oral Daily   atorvastatin  80 mg Oral q1800   bisacodyl  10 mg Oral Daily   Or   bisacodyl  10 mg Rectal Daily   Chlorhexidine Gluconate Cloth  6 each Topical Daily   clopidogrel  75 mg Oral Daily   docusate  200 mg Oral Daily   enoxaparin (LOVENOX) injection  30 mg Subcutaneous QHS   feeding supplement  237 mL Oral TID BM   furosemide  80 mg Intravenous BID   insulin aspart  0-24 Units Subcutaneous Q4H   insulin detemir  10 Units Subcutaneous Daily   mouth rinse  15 mL Mouth Rinse BID   melatonin  3 mg Oral QHS   midodrine  5 mg Oral TID WC   multivitamin with minerals  1 tablet Oral Daily   pantoprazole sodium  40 mg Oral Daily   polyethylene glycol  17 g Oral Daily   potassium chloride  40 mEq Oral Q4H   QUEtiapine  50 mg Oral QHS   sodium chloride flush  3 mL Intravenous Q12H   Continuous Infusions:  sodium chloride     lactated ringers     norepinephrine (LEVOPHED) Adult infusion Stopped (07/25/20 1426)   potassium chloride 10 mEq (07/26/20 0924)   PRN Meds: dextrose, lactated ringers, magic mouthwash, metoprolol tartrate, morphine injection, ondansetron (ZOFRAN) IV, oxyCODONE, sodium chloride flush, traMADol   Vital Signs    Vitals:   07/26/20 0600 07/26/20 0700 07/26/20 0800 07/26/20 0900  BP: (!) 115/47 (!) 118/50 (!) 97/42 (!) 92/42  Pulse: 87 89 (!) 53 (!) 44  Resp: 18 (!) 23 (!) 23 (!) 24  Temp:      TempSrc:      SpO2: 98% 94% 98% 98%  Weight:      Height:        Intake/Output Summary (Last 24 hours) at 07/26/2020 0939 Last data filed at 07/26/2020 0929 Gross per 24 hour  Intake  500.53 ml  Output 4760 ml  Net -4259.47 ml   Last 3 Weights 07/26/2020 07/25/2020 07/24/2020  Weight (lbs) 189 lb 2.5 oz 198 lb 3.1 oz 193 lb 5.5 oz  Weight (kg) 85.8 kg 89.9 kg 87.7 kg      Telemetry    AV pacing >> underlying is an accel junctional rhythm 50's-60's - Personally Reviewed  ECG    ordered - Personally Reviewed  Physical Exam   GEN: No acute distress.   Neck: No JVD Cardiac: RRR, no murmurs, rubs, or gallops.  Respiratory: decreased at the bases. GI: Soft, nontender, non-distended  MS:  trace edema; No deformity. Neuro:  Nonfocal  Psych: Normal affect   Labs    High Sensitivity Troponin:   Recent Labs  Lab 07/20/20 1538 07/20/20 1729  TROPONINIHS 3,198* 3,282*      Chemistry Recent Labs  Lab 07/25/20 0257 07/25/20 1555 07/26/20 0357  NA 145 145 145  K 3.4* 3.4* 3.2*  CL 111 108 111  CO2 '30 31 28  '$ GLUCOSE 96 119* 122*  BUN 35* 33* 30*  CREATININE 1.77*  1.65* 1.44*  CALCIUM 7.8* 8.2* 7.3*  PROT  --   --  4.2*  ALBUMIN  --   --  2.5*  AST  --   --  321*  ALT  --   --  938*  ALKPHOS  --   --  94  BILITOT  --   --  1.3*  GFRNONAA 29* 32* 37*  ANIONGAP 4* 6 6     Hematology Recent Labs  Lab 07/24/20 0315 07/25/20 0257 07/26/20 0357  WBC 10.5 8.3 7.5  RBC 2.78* 2.79* 2.88*  HGB 8.6* 8.6* 9.1*  HCT 26.5* 26.9* 27.8*  MCV 95.3 96.4 96.5  MCH 30.9 30.8 31.6  MCHC 32.5 32.0 32.7  RDW 17.3* 16.7* 16.7*  PLT 50* 64* 79*    BNPNo results for input(s): BNP, PROBNP in the last 168 hours.   DDimer No results for input(s): DDIMER in the last 168 hours.   Radiology    DG Chest 1 View Result Date: 07/26/2020 CLINICAL DATA:  Sore chest.  Status post open heart surgery. EXAM: CHEST  1 VIEW COMPARISON:  07/24/2020. FINDINGS: Interval removal of 2 left chest tubes. Right IJ sheath in stable position. Prior median sternotomy. Stable cardiomegaly. Low lung volumes with persistent bibasilar atelectasis. Bibasilar infiltrates/edema cannot be  excluded. Small right pleural effusion again noted. Tiny left pleural effusion cannot be excluded. No pneumothorax. IMPRESSION: 1. Interval removal of 2 left chest tubes. No pneumothorax. Right IJ sheath in stable position. 2. Prior median sternotomy. Stable cardiomegaly. 3. Low lung volumes with persistent bibasilar atelectasis. Bibasilar infiltrates/edema cannot be excluded. Small right pleural effusion again noted. Tiny left pleural effusion cannot be excluded. Electronically Signed   By: Marcello Moores  Register   On: 07/26/2020 05:53    Cardiac Studies   07/22/20: TEE LEFT VENTRICLE: EF = 40-45%.The LV has marked LVH and is underfilled.There is external compression of the LV from a fluid collection posterior an lateral to the heart.  RIGHT VENTRICLE: Moderate HK + swan LEFT ATRIUM: Dilated. Mild external compression  LEFT ATRIAL APPENDAGE: Not visualized RIGHT ATRIUM: Dialted AORTIC VALVE:  Trileaflet. No AI/AS MITRAL VALVE:    Normal. Trivial MR TRICUSPID VALVE: Normal. Trivial TR PULMONIC VALVE: Normal INTERATRIAL SEPTUM: Not well visualized PERICARDIUM: Large echodense collection posterior and lateral to heart.  DESCENDING AORTA: Not visualized     07/21/20: LHC Mid LM lesion is 95% stenosed. Ost Cx lesion is 99% stenosed. Dist LM to Ost LAD lesion is 95% stenosed. Mid LAD lesion is 95% stenosed. Prox Cx lesion is 90% stenosed. Prox RCA to Mid RCA lesion is 80% stenosed. RPDA lesion is 80% stenosed. RPAV lesion is 80% stenosed. Ost LM lesion is 30% stenosed.         07/20/20; TTE IMPRESSIONS   1. Left ventricular ejection fraction, by estimation, is 40 to 45%. The  left ventricle has mild to moderately decreased function. The left  ventricle demonstrates regional wall motion abnormalities (see scoring  diagram/findings for description). Left  ventricular diastolic parameters are consistent with Grade I diastolic  dysfunction (impaired relaxation).   2. Right ventricular  systolic function is normal. The right ventricular  size is normal.   3. The mitral valve is normal in structure. Mild mitral valve  regurgitation.   4. The aortic valve is calcified. Aortic valve regurgitation is not  visualized. Mild aortic valve sclerosis is present, with no evidence of  aortic valve stenosis.   5. The inferior vena cava is normal in size with <  50% respiratory  variability, suggesting right atrial pressure of 8 mmHg.     Patient Profile     80 y.o. female with a hx of DM, obesity, HTN admitted with NSTEMI > LHC noted critical LM and Multivessel CAD > IABP > emergent CABG 07/22/20 (x5 (LIMA to LAD, SEQ SVG to OM and Diagonal, SEQ to PL and PDA)  Admitted with NSTEMI > LHC noted critical LM disease > IABP > emergent CABG 07/21/20,  Post op shock with suspect tamponade, TEE confirmed  Post op with tamponade and brought back to the OR 07/22/20   Extubated 07/22/20, epi 3, milrinone 0.125, and VP 0.02 3/26 very agitated, remained on NE 3, milrinone 0.125 and precedex 07/24/20 remained confused yesterday CO-OX 65%. Norepi 4 mcg + milrinone 0.125 mcg Na lower 145      Plts 47K => 50=>64K.  Creatinine 1.1 -> 1.4 -> 1.7 -> 1.9 -> 2.01->1.7  Today  CT out yesterday Off inotrope/pressors   Assessment & Plan    1. P/o CHB     Today is POD #5 CABG                    POD #4 re-exploration/tamponade evacuation     No nodal blockers on board     She is AV pacing via epicardial wires.  Dr. Lovena Le has seen and examined the patient She has an underlying accel junctional rhythm today 50's-60's Temp pacer placed on VVI 40 back up Check EKG today   Will hold her NPO tonight for possible pacer tomorrow LVEF 40-45%, baseline LBBB Likely CRT-P   C/w Attending and HF team   Cardiogenic shock post-op (tamponade)   Heart failure team on board CAD   Post CABG AKI   Creat trending down P/o anemia, thrombocytopenia    H/H stable, plts up some    For questions or  updates, please contact [facility] Please consult [email or web address] for contact info under      Signed, Baldwin [place], PA-C  07/26/2020, 9:39 AM    EP Attending  Patient seen and examined. Agree with above. The patient is improving. She has an escape today and we have turned down her epicardial PM and her VR is in the 50's. She will probably require PPM in the next 24-48 hours.   Carleene Overlie Yaritza Leist,MD

## 2020-07-26 NOTE — Progress Notes (Signed)
Inpatient Rehab Admissions Coordinator:    I spoke with Pt.'s daughter, Venida Jarvis, who states that she is interested in CIR admit once Pt. Is medically stable for transfer. I will follow for potential admit pending medical readiness, insurance authorization, and bed availability.   Clemens Catholic, [place], [place] Admissions Coordinator  [phone] ([place]) [phone] (office)

## 2020-07-26 NOTE — Op Note (Signed)
Procedure(s): MEDIASTINAL EXPLORATION TRANSESOPHAGEAL ECHOCARDIOGRAM (TEE) Procedure Note  Kristin [name] female 80 y.o. 07/26/2020  Procedure(s) and Anesthesia Type:    * MEDIASTINAL EXPLORATION - General    * TRANSESOPHAGEAL ECHOCARDIOGRAM (TEE) - General  Surgeon(s) and Role:    * [name], MD - Primary   Indications: The patient is taken to the operating room urgently with a diagnosis of cardiac tamponade status post CABG     Surgeon: [name]   Assistants: Staff  Anesthesia: General endotracheal anesthesia  ASA Class: 4    Procedure Detail  MEDIASTINAL EXPLORATION, TRANSESOPHAGEAL ECHOCARDIOGRAM (TEE) After informed consent was obtained from the patient's family, she is brought the operating room urgently.  She is placed in the supine position on the operating table.  Anesthesia was confirmed.  The chest was cleansed and draped sterilely.  A preop surgical pause was performed.  The prior sternotomy incision was reopened.  There was a small amount of clot encountered in the anterior mediastinum.  A large clot was encountered posteriorly and inferior.  This was thoroughly evacuated and the mediastinum  was irrigated.  2 discrete bleeding points were seen one on the IMA graft and one on the vein graft to the right coronary artery distribution.  These were repaired with single interrupted sutures.  Chest tubes were returned to the chest and pleural spaces.  The sternum closed in a standard fashion with stainless steel wires.  The tissue overlying the sternum was then copiously irrigated and repaired in layers.  Sterile dressings were applied all sponge instrument needle counts were correct.  Estimated Blood Loss:  Per anesthesia record         Drains: 3 Blake drains          Blood Given: Per anesthesia record         Specimens: None         Implants: none        Complications:  * No complications entered in OR log *         Disposition: ICU - intubated  and critically ill.         Condition: stable

## 2020-07-26 NOTE — Progress Notes (Addendum)
NAME:  Kristin [name], MRN:  [id number], DOB:  March 03, 1941, LOS: 6 ADMISSION DATE:  07/20/2020, CONSULTATION DATE:  07/25/2020 REFERRING MD:  Orvan Seen - TCTS, CHIEF COMPLAINT:  Delirium   History of Present Illness:  80 year old woman who was delirious following urgent CABG following NSTEMI  Pertinent  Medical History  Presented on 3/23 to [facility] with RSCP radiating to left arm on background of crescendo DOE for the past few months. Found to have 3V CAD with distal LM occlusion.   Significant Hospital Events: Including procedures, antibiotic start and stop dates in addition to other pertinent events   . 3/23 transfer from [facility] - 3V CAD with LM occlusion. EF 45%. IABP placed.  . 3/24 underwent CABGx5 LIMA to LAD, SVG to OM and D1, SVG PL PDA . 3/25 re-exploration for regional tamponade . 3/26 Extubated  . Delirium requiring Precedex  Interim History / Subjective:  No overnight agitation. For PPM soon. Intermittent loss of capture for ECPM. Tired, refusing PT. Appetite is poor.     Objective   Blood pressure (!) 101/38, pulse (!) 50, temperature 98.7 F (37.1 C), temperature source Oral, resp. rate (!) 29, height '5\' 6"'$  (1.676 m), weight 85.8 kg, SpO2 93 %. CVP:  [6 mmHg-46 mmHg] 9 mmHg      Intake/Output Summary (Last 24 hours) at 07/26/2020 1102 Last data filed at 07/26/2020 [id number] Gross per 24 hour  Intake 500.53 ml  Output 4060 ml  Net -3559.47 ml   Filed Weights   07/24/20 0858 07/25/20 0500 07/26/20 0500  Weight: 87.7 kg 89.9 kg 85.8 kg    Examination: General: moderately obese woman, appears stated age and sitting calmly in chair. HENT: no icterus. EOM normal  Lungs: chest clear bilaterally. On room air Chest: normal chest excursion. Clean midline sternotomy with no tenderness. No sternal click. Minimal chest tube output.  Cardiovascular: no JVD, HS normal, no rub. Paced rhythm. Abdomen: Soft.  Extremities: Extremities are warm. Trace edema.  Neuro: awake and alert, oriented,  understands why she is here. No focal deficits.  GU: concentrated urine, Foley catheter still in place.   Labs/imaging that I havepersonally reviewed  (right click and "Reselect all SmartList Selections" daily)  Creatinine is improving.  Platelet count is improving.   Resolved Hospital Problem list   Cardiac tamponade Acute respiratory failure   Assessment & Plan:  CAD status post CABG Delirium with sundowning.  AKI Remains critically ill due to Ischemic cardiomyopathy with moderate LV dysfunction requiring titration of milrinone and NE  Critically ill due to severe bradycardia requiring pacing. Overweight. HTN Diabetes type 2 Thrombocytopenia due to IABP and recent CPB  Plan:  - Overall appears to have turned the corner.  No off NE and milrinone - Seroquel at bedtime to prevent agitated delirium - if does well tonight can consider transfer to floor which might also reduce risk for further delirum - AKI improving - continue judicious diuresis per HF, although patient is currently on room air, so shouldn't require much more diuresis. - Initiate secondary cardiac prevention - would recommend stopping Actos and starting metformin and GLP-1 prior to discharge.  -For PPM placement - Starting Megace for appetite stimulation.  - Will likely need in patient rehabilitation.    Best practice (right click and "Reselect all SmartList Selections" daily)  Diet:  Oral - started megace.  Pain/Anxiety/Delirium protocol (if indicated): No Seroquel qhs VAP protocol (if indicated): Not indicated DVT prophylaxis: SCD Lovenox.  GI prophylaxis: N/A Glucose control:  SSI Yes Central venous access:  Yes, and it is no longer needed Arterial line:  N/A Foley:  Yes, and it is no longer needed Mobility:  OOB  PT consulted: Yes Last date of multidisciplinary goals of care discussion [per TCTS] Code Status:  full code Disposition: ICU  Labs   CBC: Recent Labs  Lab 07/22/20 1700 07/23/20 0300  07/23/20 0434 07/24/20 0315 07/25/20 0257 07/26/20 0357  WBC 9.3 9.7  --  10.5 8.3 7.5  HGB 10.9* 9.6* 8.5* 8.6* 8.6* 9.1*  HCT 31.1* 28.0* 25.0* 26.5* 26.9* 27.8*  MCV 88.1 90.6  --  95.3 96.4 96.5  PLT 52* 47*  --  50* 64* 79*    Basic Metabolic Panel: Recent Labs  Lab 07/20/20 1538 07/21/20 0442 07/22/20 0404 07/22/20 0422 07/22/20 1700 07/23/20 0300 07/23/20 0434 07/24/20 0315 07/25/20 0257 07/25/20 1032 07/25/20 1555 07/26/20 0357  NA  --    < > 150*   < > 150* 150* 152* 148* 145  --  145 145  K  --    < > 4.0   < > 3.6 3.4* 3.4* 3.4* 3.4*  --  3.4* 3.2*  CL  --    < > 111  --  114* 113*  --  113* 111  --  108 111  CO2  --    < > 20*  --  27 27  --  29 30  --  31 28  GLUCOSE  --    < > 124*  --  148* 139*  --  170* 96  --  119* 122*  BUN  --    < > 19  --  24* 28*  --  36* 35*  --  33* 30*  CREATININE  --    < > 1.70*  --  1.91* 1.90*  --  2.01* 1.77*  --  1.65* 1.44*  CALCIUM  --    < > 7.5*  --  8.3* 8.3*  --  7.9* 7.8*  --  8.2* 7.3*  MG 1.9  --  2.5*  --  2.4  --   --   --   --  1.8  --  1.8   < > = values in this interval not displayed.   GFR: Estimated Creatinine Clearance: 35.5 mL/min (A) (by C-G formula based on SCr of 1.44 mg/dL (H)). Recent Labs  Lab 07/23/20 0300 07/24/20 0315 07/25/20 0257 07/26/20 0357  WBC 9.7 10.5 8.3 7.5    Liver Function Tests: Recent Labs  Lab 07/26/20 0357  AST 321*  ALT 938*  ALKPHOS 94  BILITOT 1.3*  PROT 4.2*  ALBUMIN 2.5*   No results for input(s): LIPASE, AMYLASE in the last 168 hours. No results for input(s): AMMONIA in the last 168 hours.  ABG    Component Value Date/Time   PHART 7.442 07/23/2020 0434   PCO2ART 42.7 07/23/2020 0434   PO2ART 68 (L) 07/23/2020 0434   HCO3 29.2 (H) 07/23/2020 0434   TCO2 31 07/23/2020 0434   ACIDBASEDEF 1.0 07/22/2020 0814   O2SAT 59.9 07/26/2020 0357     Coagulation Profile: Recent Labs  Lab 07/21/20 1602  INR 1.6*    Cardiac Enzymes: No results for  input(s): CKTOTAL, CKMB, CKMBINDEX, TROPONINI in the last 168 hours.  HbA1C: Hgb A1c MFr Bld  Date/Time Value Ref Range Status  07/20/2020 03:38 PM 8.8 (H) 4.8 - 5.6 % Final    Comment:    (NOTE) Pre diabetes:  5.7%-6.4%  Diabetes:              >6.4%  Glycemic control for   <7.0% adults with diabetes     CBG: Recent Labs  Lab 07/25/20 1542 07/25/20 1945 07/26/20 0000 07/26/20 0355 07/26/20 [place] 186* 220* 125* [place], MD [facility] ICU Physician [place]  Pager: [phone] Mobile: [phone] After hours: [phone].

## 2020-07-26 NOTE — Progress Notes (Signed)
Inpatient Diabetes Program Recommendations  AACE/ADA: New Consensus Statement on Inpatient Glycemic Control   Target Ranges:  Prepandial:   less than 140 mg/dL      Peak postprandial:   less than 180 mg/dL (1-2 hours)      Critically ill patients:  140 - 180 mg/dL   Results for AKIRRA, NOVACK (MRN [id number]) as of 07/26/2020 15:04  Ref. Range 07/25/2020 03:26 07/25/2020 07:31 07/25/2020 11:09 07/25/2020 15:42 07/25/2020 19:45 07/26/2020 00:00 07/26/2020 03:55 07/26/2020 07:49 07/26/2020 12:27  Glucose-Capillary Latest Ref Range: 70 - 99 mg/dL 97 121 (H) 104 (H) 125 (H) 186 (H) 220 (H) 125 (H) 176 (H) 288 (H)   Review of Glycemic Control  Diabetes history: DM2 Outpatient Diabetes medications: Amaryl 1 mg QAM, Levemir 42 units daily, Actos 30 mg daily Current orders for Inpatient glycemic control: Levemir 10 units daily, Novolog 0-24 units Q4H  Inpatient Diabetes Program Recommendations:    Insulin: Please consider ordering Novolog 5 units TID with meals for meal coverage if patient eats at least 50% of meals.  Thanks, Barnie Alderman, RN, MSN, CDE Diabetes Coordinator Inpatient Diabetes Program [phone] (Team Pager from 8am to 5pm)

## 2020-07-26 NOTE — Progress Notes (Signed)
Inpatient Rehab Admissions Coordinator Note:   Per therapy recommendations, pt was screened for CIR candidacy by Shann Medal, PT, DPT.  At this time we are recommending a CIR consult and I will place an order per our protocol.  Please contact me with questions.   Shann Medal, PT, DPT [phone] 07/26/20 11:04 AM

## 2020-07-26 NOTE — Progress Notes (Signed)
Pt placed back in DDD with back up rate of 50 per [place], PA request. She will come to bedside to assess pt again shortly.

## 2020-07-26 NOTE — Progress Notes (Addendum)
Patient ID: Kristin [name], female   DOB: 1940/11/23, 80 y.o.   MRN: [id number]    Cardiologist: Terrence Dupont  Advanced Heart Failure Rounding Note   Subjective:    - 3/24 NSTEMI - 3/24 Emergent CABG x 5 (LIMA to LAD, SEQ SVG to OM and Diagonal, SEQ to PL and PDA) - 3/25 take back to OR for tamponade. EF on TEE 55%, RV mildly down (3/25) -3/26 Extubated -3/28 Milrinone stopped. Midodrine started.   CO-OX 60%. Off pressors.  Had BM 3/28&3/29   Wants to go back to bed. Complaining of sore mouth.   Objective:   Weight Range:  Vital Signs:   Temp:  [97.9 F (36.6 C)-98.7 F (37.1 C)] 98.7 F (37.1 C) (03/29 0356) Pulse Rate:  [44-99] 44 (03/29 0900) Resp:  [13-29] 24 (03/29 0900) BP: (92-138)/(42-88) 92/42 (03/29 0900) SpO2:  [89 %-98 %] 98 % (03/29 0900) Weight:  [85.8 kg] 85.8 kg (03/29 0500) Last BM Date: 07/25/20  Weight change: Filed Weights   07/24/20 0858 07/25/20 0500 07/26/20 0500  Weight: 87.7 kg 89.9 kg 85.8 kg    Intake/Output:   Intake/Output Summary (Last 24 hours) at 07/26/2020 0919 Last data filed at 07/26/2020 0900 Gross per 24 hour  Intake 500.53 ml  Output 4758 ml  Net -4257.47 ml     CVP 8  Physical Exam: General:  Sitting in the chair.  No resp difficulty HEENT: normal Neck: supple. JVP 7-8 . Carotids 2+ bilat; no bruits. No lymphadenopathy or thryomegaly appreciated. RIJ Cor: PMI nondisplaced. Regular rate & rhythm. No rubs, gallops or murmurs. Lungs: clear Abdomen: soft, nontender, nondistended. No hepatosplenomegaly. No bruits or masses. Good bowel sounds. Extremities: no cyanosis, clubbing, rash, edema Neuro: alert & orientedx3, cranial nerves grossly intact. moves all 4 extremities w/o difficulty. Affect pleasant  Telemetry: CHB 70s   Labs: Basic Metabolic Panel: Recent Labs  Lab 07/20/20 1538 07/21/20 0442 07/22/20 0404 07/22/20 0422 07/22/20 1700 07/23/20 0300 07/23/20 0434 07/24/20 0315 07/25/20 0257 07/25/20 1032  07/25/20 1555 07/26/20 0357  NA  --    < > 150*   < > 150* 150* 152* 148* 145  --  145 145  K  --    < > 4.0   < > 3.6 3.4* 3.4* 3.4* 3.4*  --  3.4* 3.2*  CL  --    < > 111  --  114* 113*  --  113* 111  --  108 111  CO2  --    < > 20*  --  27 27  --  29 30  --  31 28  GLUCOSE  --    < > 124*  --  148* 139*  --  170* 96  --  119* 122*  BUN  --    < > 19  --  24* 28*  --  36* 35*  --  33* 30*  CREATININE  --    < > 1.70*  --  1.91* 1.90*  --  2.01* 1.77*  --  1.65* 1.44*  CALCIUM  --    < > 7.5*  --  8.3* 8.3*  --  7.9* 7.8*  --  8.2* 7.3*  MG 1.9  --  2.5*  --  2.4  --   --   --   --  1.8  --  1.8   < > = values in this interval not displayed.    Liver Function Tests: Recent Labs  Lab 07/26/20 0357  AST  321*  ALT 938*  ALKPHOS 94  BILITOT 1.3*  PROT 4.2*  ALBUMIN 2.5*   No results for input(s): LIPASE, AMYLASE in the last 168 hours. No results for input(s): AMMONIA in the last 168 hours.  CBC: Recent Labs  Lab 07/22/20 1700 07/23/20 0300 07/23/20 0434 07/24/20 0315 07/25/20 0257 07/26/20 0357  WBC 9.3 9.7  --  10.5 8.3 7.5  HGB 10.9* 9.6* 8.5* 8.6* 8.6* 9.1*  HCT 31.1* 28.0* 25.0* 26.5* 26.9* 27.8*  MCV 88.1 90.6  --  95.3 96.4 96.5  PLT 52* 47*  --  50* 64* 79*    Cardiac Enzymes: No results for input(s): CKTOTAL, CKMB, CKMBINDEX, TROPONINI in the last 168 hours.  BNP: BNP (last 3 results) No results for input(s): BNP in the last 8760 hours.  ProBNP (last 3 results) No results for input(s): PROBNP in the last 8760 hours.    Other results:  Imaging: DG Chest 1 View  Result Date: 07/26/2020 CLINICAL DATA:  Sore chest.  Status post open heart surgery. EXAM: CHEST  1 VIEW COMPARISON:  07/24/2020. FINDINGS: Interval removal of 2 left chest tubes. Right IJ sheath in stable position. Prior median sternotomy. Stable cardiomegaly. Low lung volumes with persistent bibasilar atelectasis. Bibasilar infiltrates/edema cannot be excluded. Small right pleural effusion  again noted. Tiny left pleural effusion cannot be excluded. No pneumothorax. IMPRESSION: 1. Interval removal of 2 left chest tubes. No pneumothorax. Right IJ sheath in stable position. 2. Prior median sternotomy. Stable cardiomegaly. 3. Low lung volumes with persistent bibasilar atelectasis. Bibasilar infiltrates/edema cannot be excluded. Small right pleural effusion again noted. Tiny left pleural effusion cannot be excluded. Electronically Signed   By: Marcello Moores  Register   On: 07/26/2020 05:53     Medications:     Scheduled Medications: . acetaminophen (TYLENOL) oral liquid 160 mg/5 mL  1,000 mg Oral Q6H   Or  . acetaminophen  1,000 mg Oral Q6H  . aspirin  81 mg Oral Daily  . atorvastatin  80 mg Oral q1800  . bisacodyl  10 mg Oral Daily   Or  . bisacodyl  10 mg Rectal Daily  . Chlorhexidine Gluconate Cloth  6 each Topical Daily  . clopidogrel  75 mg Oral Daily  . docusate  200 mg Oral Daily  . enoxaparin (LOVENOX) injection  30 mg Subcutaneous QHS  . feeding supplement  237 mL Oral TID BM  . furosemide  80 mg Intravenous BID  . insulin aspart  0-24 Units Subcutaneous Q4H  . insulin detemir  10 Units Subcutaneous Daily  . mouth rinse  15 mL Mouth Rinse BID  . melatonin  3 mg Oral QHS  . midodrine  5 mg Oral TID WC  . multivitamin with minerals  1 tablet Oral Daily  . pantoprazole sodium  40 mg Oral Daily  . polyethylene glycol  17 g Oral Daily  . potassium chloride  40 mEq Oral Q4H  . QUEtiapine  50 mg Oral QHS  . sodium chloride flush  3 mL Intravenous Q12H  . sorbitol  30 mL Oral Daily    Infusions: . sodium chloride    . lactated ringers    . norepinephrine (LEVOPHED) Adult infusion Stopped (07/25/20 1426)  . potassium chloride 50 mL/hr at 07/26/20 0900    PRN Medications: dextrose, lactated ringers, metoprolol tartrate, morphine injection, ondansetron (ZOFRAN) IV, oxyCODONE, sodium chloride flush, traMADol   Assessment/Plan:   1. Cardiogenic shock - s/p CABG 3/24  with take back for tamponade on 3/25 -  LVEF 55% on post-op TEE 3/25 - Off pressors. CO-OX 60%.  - CVP 8-9. Continue IV lasix today. Supp K and MAg - Continue midodrine 5 mg three times a day.    2. CAD s/p NSTEMI  - CABG x 5 3/24 - no evidence of ongoing ischemia - continue ASA, statin. No bb with shock.  - will need DAPT prior to d/c - Will SGT2i soon.   3. Acute hypoxic respiratory failure post-op - Extubated 3/26--> on 2 liters [place]  4. AKI - baseline creatinine 1.1 -> peaked at 2, today 1.4  - due to shock/ATN - continue hemodynamic support   5. Acute blood loss anemia, post -op - transfuse to keep hgb >= 7.5, 8.6 unchanged at 9.1   6. Hypernatremia -Sodium 145   7. Low platelets - Plts trending up 50> 64>79K.   8. Post-op delirium - Baseline very anxious.  - On immediate release Seroquel which can be crushed.  - Continue melatonin   9. Deconditioning Consult PT/OT  10. Dysphagia Speech following  Crush meds with small sips.   11. CHB EP consulted. Plan for CRT-P tomorrow.  - Remove RIJ in am prior to procedure.    Length of Stay: [place] NP-C  07/26/2020, 9:19 AM  Advanced Heart Failure Team Pager [phone] (M-F; 7a - 4p)  Please contact [facility] for night-coverage after hours (4p -7a ) and weekends on [email or web address]  Patient seen and examined with the above-signed Advanced Practice Provider and/or Housestaff. I personally reviewed laboratory data, imaging studies and relevant notes. I independently examined the patient and formulated the important aspects of the plan. I have edited the note to reflect any of my changes or salient points. I have personally discussed the plan with the patient and/or family.   She is feeling a bit better today. CT out. Off inotropes. Co-ox  60% CVP 8-10. Deniss CPR or SOB. Remains v-paced. Underlying rhythm is sinus with CHB and V escape in 35-40 range  General:  Sitting in chair  HEENT: normal Neck: supple. JVP  to jaw  Carotids 2+ bilat; no bruits. No lymphadenopathy or thryomegaly appreciated. Cor: PMI nondisplaced. Sternal wound ok  Lungs: decreased at bases Abdomen: soft, nontender, nondistended. No hepatosplenomegaly. No bruits or masses. Good bowel sounds. Extremities: no cyanosis, clubbing, rash, 2+ edema Neuro: alert & orientedx3, cranial nerves grossly intact. moves all 4 extremities w/o difficulty. Affect pleasant  Off pressors. Still volume overloaded. Give IV lasix. Place TED hose. Continues with CHB but now with junctional escape that she didn't have yesterday. Tentatively scheduled for pacer tomorrow. But hopefully conduction system will improve.   [name], MD  2:04 PM

## 2020-07-26 NOTE — Progress Notes (Signed)
TCTS BRIEF SICU PROGRESS NOTE  4 Days Post-Op  S/P Procedure(s) (LRB): MEDIASTINAL EXPLORATION (N/A) TRANSESOPHAGEAL ECHOCARDIOGRAM (TEE) (N/A)   Stable day AV paced w/ stable BP Breathing comfortably on room air  Plan: Continue current plan  Rexene Alberts, MD 07/26/2020 5:27 PM

## 2020-07-26 NOTE — Progress Notes (Signed)
4 Days Post-Op Procedure(s) (LRB): MEDIASTINAL EXPLORATION (N/A) TRANSESOPHAGEAL ECHOCARDIOGRAM (TEE) (N/A) Subjective:  "cold"  Objective: Vital signs in last 24 hours: Temp:  [98 F (36.7 C)-98.7 F (37.1 C)] 98.4 F (36.9 C) (03/29 1548) Pulse Rate:  [42-117] 80 (03/29 1500) Cardiac Rhythm: A-V Sequential paced (03/29 1200) Resp:  [18-29] 28 (03/29 1500) BP: (92-125)/(38-66) 113/47 (03/29 1500) SpO2:  [92 %-99 %] 93 % (03/29 1500) Weight:  [85.8 kg] 85.8 kg (03/29 0500)  Hemodynamic parameters for last 24 hours: CVP:  [6 mmHg-46 mmHg] 8 mmHg  Intake/Output from previous day: 03/28 0701 - 03/29 0700 In: 254.3 [I.V.:204.3; IV Piggyback:50] Out: 4758 [Urine:4595; Stool:3; Chest Tube:160] Intake/Output this shift: Total I/O In: 366.3 [P.O.:240; IV Piggyback:126.3] Out: 404 [Urine:402; Stool:2]  General appearance: alert and cooperative Neurologic: intact Heart: bradycardiac underneath Temp pacer Lungs: clear to auscultation bilaterally Abdomen: soft, non-tender; bowel sounds normal; no masses,  no organomegaly Extremities: extremities normal, atraumatic, no cyanosis or edema Wound: dressed,dry  Lab Results: Recent Labs    07/25/20 0257 07/26/20 0357  WBC 8.3 7.5  HGB 8.6* 9.1*  HCT 26.9* 27.8*  PLT 64* 79*   BMET:  Recent Labs    07/26/20 0357 07/26/20 1202 07/26/20 1401  NA 145 142  --   K 3.2* 7.0* 4.9  CL 111 106  --   CO2 28 29  --   GLUCOSE 122* 272*  --   BUN 30* 34*  --   CREATININE 1.44* 1.72*  --   CALCIUM 7.3* 8.0*  --     PT/INR: No results for input(s): LABPROT, INR in the last 72 hours. ABG    Component Value Date/Time   PHART 7.442 07/23/2020 0434   HCO3 29.2 (H) 07/23/2020 0434   TCO2 31 07/23/2020 0434   ACIDBASEDEF 1.0 07/22/2020 0814   O2SAT 59.9 07/26/2020 0357   CBG (last 3)  Recent Labs    07/26/20 0749 07/26/20 1227 07/26/20 1546  GLUCAP 176* 288* 327*    Assessment/Plan: S/P Procedure(s) (LRB): MEDIASTINAL  EXPLORATION (N/A) TRANSESOPHAGEAL ECHOCARDIOGRAM (TEE) (N/A) Mobilize Diuresis appreciate EP input and plan   LOS: 6 days    [name] 07/26/2020

## 2020-07-26 NOTE — Evaluation (Signed)
Occupational Therapy Evaluation Patient Details Name: Kristin [name] MRN: [id number] DOB: [birth year] Today's Date: 07/26/2020    History of Present Illness Kristin [name] is a 80 y/o female admitted 3/23 from [place] with emergent cath then to ICU on 07/21/20. Pt with NSTEMI and underwent CABGx5 on 07/22/20. Post-op tamponade requiring return to OR on 3/25 for hematoma evacuation and then extubated on 3/26. PMH includes DM,HTN, HLD, and history of gallbladder surgery.   Clinical Impression   Pt typically independent in home environment, enjoys her grandchildren (older). Today Pt is mod A +2 for sit<>stand and min A +2 once in standing for pivot to BSC, requires max A for rear peri care in standing (Pt able to stand with assist and OT performing peri care), Pt max A for LB dressing, mod A for return supine. Pt limited this session by BP, and HR (external pacer firing, HR sustaining in the low 40's) RN aware and in room at end of session. BP listed below. Pt will benefit from skilled OT in the acute setting as well as afterwards at the CIR level to maximize safety and independence in ADL and functional transfers. Pt was provided with sternal precaution handout - but needs much more reinforcement of ADL application.   96/49 in recliner 94/40 on BSC after transfer 100/40 supine    Follow Up Recommendations  CIR    Equipment Recommendations  3 in 1 bedside commode;Other (comment) (defer to next venue of care)    Recommendations for Other Services Rehab consult     Precautions / Restrictions Precautions Precautions: Sternal;Fall;Other (comment) Precaution Booklet Issued: Yes (comment) Precaution Comments: external pacer Restrictions Weight Bearing Restrictions: No      Mobility Bed Mobility Overal bed mobility: Needs Assistance Bed Mobility: Sit to Supine       Sit to supine: Mod assist   General bed mobility comments: Mod Ax1 for LE management    Transfers Overall transfer level:  Needs assistance Equipment used: Rolling walker (2 wheeled) Transfers: Sit to/from [facility] Sit to Stand: +2 physical assistance;Mod assist;Min assist Stand pivot transfers: Min assist;+2 physical assistance       General transfer comment: Mod Ax2 to come into standing from chair, min Ax2 from sit to stand from [facility], min Ax for steadying during stand pivot transfer, cueing for sequencing and navigation with RW    Balance Overall balance assessment: Needs assistance Sitting-balance support: Feet supported Sitting balance-Leahy Scale: Fair       Standing balance-Leahy Scale: Poor Standing balance comment: Reliant on B UE support and external assistance                           ADL either performed or assessed with clinical judgement   ADL Overall ADL's : Needs assistance/impaired Eating/Feeding: Set up;Sitting   Grooming: Wash/dry face;Set up;Sitting   Upper Body Bathing: Moderate assistance   Lower Body Bathing: Maximal assistance   Upper Body Dressing : Maximal assistance;Cueing for compensatory techniques   Lower Body Dressing: Maximal assistance;Sit to/from stand   Toilet Transfer: Minimal assistance;+2 for physical assistance;+2 for safety/equipment Toilet Transfer Details (indicate cue type and reason): cues for safe hand placement Toileting- Clothing Manipulation and Hygiene: Total assistance       Functional mobility during ADLs: Minimal assistance;+2 for physical assistance;+2 for safety/equipment;Cueing for safety;Cueing for sequencing;Rolling walker General ADL Comments: needs continued education for sternal precautions with ADL focus     Vision  Perception     Praxis      Pertinent Vitals/Pain Pain Assessment: Faces Faces Pain Scale: Hurts a little bit Pain Location: generalized discomfort Pain Descriptors / Indicators: Grimacing;Operative site guarding Pain Intervention(s): Monitored during session;Repositioned      Hand Dominance     Extremity/Trunk Assessment Upper Extremity Assessment Upper Extremity Assessment: Generalized weakness   Lower Extremity Assessment Lower Extremity Assessment: Defer to PT evaluation   Cervical / Trunk Assessment Cervical / Trunk Assessment: Kyphotic   Communication Communication Communication: No difficulties   Cognition Arousal/Alertness: Awake/alert Behavior During Therapy: WFL for tasks assessed/performed Overall Cognitive Status: Within Functional Limits for tasks assessed                                 General Comments: A&Ox4. Follows commands consistently with increased time   General Comments  decreased BP, HR down in the 40's with pacer firing    Exercises     Shoulder Instructions      Home Living Family/patient expects to be discharged to:: Private residence Living Arrangements: Alone Available Help at Discharge: Family;Available PRN/intermittently Type of Home: House Home Access: Stairs to enter [employer] of Steps: 4 Entrance Stairs-Rails: Can reach both Home Layout: One level     Bathroom Shower/Tub: Teacher, early years/pre: Standard Bathroom Accessibility: Yes   Home Equipment: Environmental consultant - 2 wheels;Cane - single point;Shower seat          Prior Functioning/Environment Level of Independence: Independent        Comments: Able to walk within home/short distances        OT Problem List: Decreased strength;Decreased range of motion;Decreased activity tolerance;Impaired balance (sitting and/or standing);Decreased safety awareness;Decreased knowledge of use of DME or AE;Decreased knowledge of precautions;Cardiopulmonary status limiting activity;Pain      OT Treatment/Interventions: Self-care/ADL training;Energy conservation;DME and/or AE instruction;Therapeutic activities;Patient/family education;Balance training    OT Goals(Current goals can be found in the care plan section) Acute  Rehab OT Goals Patient Stated Goal: get stronger, stand easier OT Goal Formulation: With patient Time For Goal Achievement: 08/09/20 Potential to Achieve Goals: Good ADL Goals Pt Will Perform Grooming: standing;with supervision Pt Will Perform Upper Body Dressing: with supervision;sitting Pt Will Perform Lower Body Dressing: with supervision;sit to/from stand Pt Will Transfer to Toilet: with modified independence;ambulating Pt Will Perform Toileting - Clothing Manipulation and hygiene: with modified independence;sit to/from stand Additional ADL Goal #1: Pt will recall and maintain sternal precautions throughout ADL routine with no cues  OT Frequency: Min 2X/week   Barriers to D/C:            Co-evaluation PT/OT/SLP Co-Evaluation/Treatment: Yes Reason for Co-Treatment: For patient/therapist safety;To address functional/ADL transfers;Complexity of the patient's impairments (multi-system involvement) PT goals addressed during session: Mobility/safety with mobility;Balance;Proper use of DME OT goals addressed during session: ADL's and self-care;Proper use of Adaptive equipment and DME      AM-PAC OT "6 Clicks" Daily Activity     Outcome Measure Help from another person eating meals?: A Little Help from another person taking care of personal grooming?: A Little Help from another person toileting, which includes using toliet, bedpan, or urinal?: A Lot Help from another person bathing (including washing, rinsing, drying)?: A Lot Help from another person to put on and taking off regular upper body clothing?: A Lot Help from another person to put on and taking off regular lower body clothing?: A Lot 6 Click Score:  14   End of Session Equipment Utilized During Treatment: Gait belt;Rolling walker Nurse Communication: Mobility status;Precautions;Other (comment) (HR/BP)  Activity Tolerance: Patient limited by fatigue;Other (comment) (HR) Patient left: in bed;with call bell/phone within  reach;with nursing/sitter in room;with SCD's reapplied;with bed alarm set  OT Visit Diagnosis: Unsteadiness on feet (R26.81);Other abnormalities of gait and mobility (R26.89);Muscle weakness (generalized) (M62.81)                Time: [id number] OT Time Calculation (min): 26 min Charges:  OT General Charges $OT Visit: 1 Visit OT Evaluation $OT Eval Moderate Complexity: [place] OTR/L Acute Rehabilitation Services Pager: [phone] Office: [place] 07/26/2020, 11:57 AM

## 2020-07-27 ENCOUNTER — Inpatient Hospital Stay (HOSPITAL_COMMUNITY): Payer: Medicare HMO

## 2020-07-27 ENCOUNTER — Inpatient Hospital Stay (HOSPITAL_COMMUNITY)
Admission: AD | Disposition: A | Discharge status: Skilled Nursing Facility | Payer: Self-pay | Source: Other Acute Inpatient Hospital | Attending: Cardiothoracic Surgery

## 2020-07-27 DIAGNOSIS — Z951 Presence of aortocoronary bypass graft: Secondary | ICD-10-CM | POA: Diagnosis not present

## 2020-07-27 DIAGNOSIS — I214 Non-ST elevation (NSTEMI) myocardial infarction: Secondary | ICD-10-CM | POA: Diagnosis not present

## 2020-07-27 DIAGNOSIS — I442 Atrioventricular block, complete: Secondary | ICD-10-CM

## 2020-07-27 HISTORY — PX: BIV PACEMAKER INSERTION CRT-P: EP1199

## 2020-07-27 LAB — BASIC METABOLIC PANEL
Anion gap: 7 (ref 5–15)
BUN: 34 mg/dL — ABNORMAL HIGH (ref 8–23)
CO2: 31 mmol/L (ref 22–32)
Calcium: 8.2 mg/dL — ABNORMAL LOW (ref 8.9–10.3)
Chloride: 106 mmol/L (ref 98–111)
Creatinine, Ser: 1.68 mg/dL — ABNORMAL HIGH (ref 0.44–1.00)
GFR, Estimated: 31 mL/min — ABNORMAL LOW (ref >60)
Glucose, Bld: 96 mg/dL (ref 70–99)
Potassium: 3.8 mmol/L (ref 3.5–5.1)
Sodium: 144 mmol/L (ref 135–145)

## 2020-07-27 LAB — CBC
HCT: 30.5 % — ABNORMAL LOW (ref 36.0–46.0)
Hemoglobin: 9.6 g/dL — ABNORMAL LOW (ref 12.0–15.0)
MCH: 31.1 pg (ref 26.0–34.0)
MCHC: 31.5 g/dL (ref 30.0–36.0)
MCV: 98.7 fL (ref 80.0–100.0)
Platelets: 107 10*3/uL — ABNORMAL LOW (ref 150–400)
RBC: 3.09 MIL/uL — ABNORMAL LOW (ref 3.87–5.11)
RDW: 17.1 % — ABNORMAL HIGH (ref 11.5–15.5)
WBC: 9 10*3/uL (ref 4.0–10.5)
nRBC: 1 % — ABNORMAL HIGH (ref 0.0–0.2)

## 2020-07-27 LAB — GLUCOSE, CAPILLARY
Glucose-Capillary: 134 mg/dL — ABNORMAL HIGH (ref 70–99)
Glucose-Capillary: 152 mg/dL — ABNORMAL HIGH (ref 70–99)
Glucose-Capillary: 163 mg/dL — ABNORMAL HIGH (ref 70–99)
Glucose-Capillary: 180 mg/dL — ABNORMAL HIGH (ref 70–99)
Glucose-Capillary: 213 mg/dL — ABNORMAL HIGH (ref 70–99)
Glucose-Capillary: 272 mg/dL — ABNORMAL HIGH (ref 70–99)
Glucose-Capillary: 87 mg/dL (ref 70–99)

## 2020-07-27 LAB — MAGNESIUM: Magnesium: 2.3 mg/dL (ref 1.7–2.4)

## 2020-07-27 SURGERY — BIV PACEMAKER INSERTION CRT-P

## 2020-07-27 MED ORDER — LIDOCAINE HCL (PF) 1 % IJ SOLN
INTRAMUSCULAR | Status: DC | PRN
  Administered 2020-07-27: 50 mL

## 2020-07-27 MED ORDER — MIDAZOLAM HCL 5 MG/5ML IJ SOLN
INTRAMUSCULAR | Status: DC | PRN
  Administered 2020-07-27 (×3): 1 mg via INTRAVENOUS

## 2020-07-27 MED ORDER — CHLORHEXIDINE GLUCONATE 4 % EX LIQD
60.0000 mL | Freq: Once | CUTANEOUS | Status: AC
  Administered 2020-07-27: 4 via TOPICAL
  Filled 2020-07-27: qty 15

## 2020-07-27 MED ORDER — SODIUM CHLORIDE 0.9 % IV SOLN: INTRAVENOUS | Status: DC

## 2020-07-27 MED ORDER — DOCUSATE SODIUM 50 MG/5ML PO LIQD
200.0000 mg | Freq: Every day | ORAL | Status: DC
  Administered 2020-07-31 – 2020-08-02 (×2): 200 mg via ORAL
  Filled 2020-07-27 (×3): qty 20

## 2020-07-27 MED ORDER — CEFAZOLIN SODIUM-DEXTROSE 2-4 GM/100ML-% IV SOLN
INTRAVENOUS | Status: AC
  Filled 2020-07-27: qty 100

## 2020-07-27 MED ORDER — POTASSIUM CHLORIDE CRYS ER 20 MEQ PO TBCR
40.0000 meq | EXTENDED_RELEASE_TABLET | Freq: Two times a day (BID) | ORAL | Status: AC
  Administered 2020-07-27 (×2): 40 meq via ORAL
  Filled 2020-07-27 (×2): qty 2

## 2020-07-27 MED ORDER — FENTANYL CITRATE (PF) 100 MCG/2ML IJ SOLN
INTRAMUSCULAR | Status: DC | PRN
  Administered 2020-07-27 (×3): 12.5 ug via INTRAVENOUS

## 2020-07-27 MED ORDER — LIDOCAINE HCL 1 % IJ SOLN
INTRAMUSCULAR | Status: AC
  Filled 2020-07-27: qty 60

## 2020-07-27 MED ORDER — PANTOPRAZOLE SODIUM 40 MG PO TBEC
40.0000 mg | DELAYED_RELEASE_TABLET | Freq: Every day | ORAL | Status: DC
  Administered 2020-07-28 – 2020-08-10 (×13): 40 mg via ORAL
  Filled 2020-07-27 (×14): qty 1

## 2020-07-27 MED ORDER — SODIUM CHLORIDE 0.9 % IV SOLN: 250.0000 mL | INTRAVENOUS | Status: DC

## 2020-07-27 MED ORDER — CEFAZOLIN SODIUM-DEXTROSE 1-4 GM/50ML-% IV SOLN
1.0000 g | Freq: Four times a day (QID) | INTRAVENOUS | Status: AC
  Administered 2020-07-27 – 2020-07-28 (×3): 1 g via INTRAVENOUS
  Filled 2020-07-27 (×3): qty 50

## 2020-07-27 MED ORDER — PANTOPRAZOLE SODIUM 40 MG PO PACK
40.0000 mg | PACK | Freq: Every day | ORAL | Status: DC
  Administered 2020-08-02: 40 mg via ORAL
  Filled 2020-07-27 (×12): qty 20

## 2020-07-27 MED ORDER — FENTANYL CITRATE (PF) 100 MCG/2ML IJ SOLN
INTRAMUSCULAR | Status: AC
  Filled 2020-07-27: qty 2

## 2020-07-27 MED ORDER — ONDANSETRON HCL 4 MG/2ML IJ SOLN
4.0000 mg | Freq: Four times a day (QID) | INTRAMUSCULAR | Status: DC | PRN
  Administered 2020-07-29 – 2020-08-02 (×3): 4 mg via INTRAVENOUS
  Filled 2020-07-27 (×3): qty 2

## 2020-07-27 MED ORDER — SODIUM CHLORIDE 0.9 % IV SOLN
INTRAVENOUS | Status: AC
  Filled 2020-07-27: qty 2

## 2020-07-27 MED ORDER — SODIUM CHLORIDE 0.9% FLUSH: 3.0000 mL | INTRAVENOUS | Status: DC | PRN

## 2020-07-27 MED ORDER — HEPARIN (PORCINE) IN NACL 1000-0.9 UT/500ML-% IV SOLN
INTRAVENOUS | Status: DC | PRN
  Administered 2020-07-27: 500 mL

## 2020-07-27 MED ORDER — MIDAZOLAM HCL 5 MG/5ML IJ SOLN
INTRAMUSCULAR | Status: AC
  Filled 2020-07-27: qty 5

## 2020-07-27 MED ORDER — CEFAZOLIN SODIUM-DEXTROSE 2-4 GM/100ML-% IV SOLN
2.0000 g | INTRAVENOUS | Status: AC
  Administered 2020-07-27: 2 g via INTRAVENOUS
  Filled 2020-07-27: qty 100

## 2020-07-27 MED ORDER — SODIUM CHLORIDE 0.9 % IV SOLN
80.0000 mg | INTRAVENOUS | Status: AC
  Administered 2020-07-27: 80 mg
  Filled 2020-07-27: qty 2

## 2020-07-27 MED ORDER — HEPARIN (PORCINE) IN NACL 1000-0.9 UT/500ML-% IV SOLN
INTRAVENOUS | Status: AC
  Filled 2020-07-27: qty 500

## 2020-07-27 MED ORDER — ACETAMINOPHEN 325 MG PO TABS: 325.0000 mg | ORAL_TABLET | ORAL | Status: DC | PRN

## 2020-07-27 MED ORDER — DOCUSATE SODIUM 100 MG PO CAPS
200.0000 mg | ORAL_CAPSULE | Freq: Every day | ORAL | Status: DC
  Administered 2020-08-01 – 2020-08-09 (×7): 200 mg via ORAL
  Filled 2020-07-27 (×10): qty 2

## 2020-07-27 MED ORDER — SODIUM CHLORIDE 0.9% FLUSH
3.0000 mL | Freq: Two times a day (BID) | INTRAVENOUS | Status: DC
  Administered 2020-07-27 – 2020-08-10 (×26): 3 mL via INTRAVENOUS

## 2020-07-27 MED ORDER — IOHEXOL 350 MG/ML SOLN
INTRAVENOUS | Status: DC | PRN
  Administered 2020-07-27: 2 mL
  Administered 2020-07-27: 10 mL

## 2020-07-27 MED ORDER — SODIUM CHLORIDE 0.9 % IV SOLN
250.0000 mL | INTRAVENOUS | Status: DC
  Administered 2020-07-27: 250 mL via INTRAVENOUS

## 2020-07-27 SURGICAL SUPPLY — 17 items
BALLN COR SINUS VENO 6FR 80 (BALLOONS) ×2
BALLOON COR SINUS VENO 6FR 80 (BALLOONS) IMPLANT
CABLE SURGICAL S-101-97-12 (CABLE) ×2 IMPLANT
CATH CPS DIRECT 135 DS2C020 (CATHETERS) ×2 IMPLANT
CATH CPS QUART CN DS2N029-65 (CATHETERS) ×1 IMPLANT
CPS IMPLANT KIT 410190 (MISCELLANEOUS) ×1 IMPLANT
LEAD QUARTET 1458Q-86CM (Lead) ×1 IMPLANT
LEAD TENDRIL MRI 52CM LPA1200M (Lead) ×1 IMPLANT
LEAD TENDRIL MRI 58CM LPA1200M (Lead) ×1 IMPLANT
PACEMAKER QUDR ALLR CRT PM3562 (Pacemaker) IMPLANT
PAD PRO RADIOLUCENT 2001M-C (PAD) ×2 IMPLANT
PMKR QUADRA ALLURE CRT PM3562 (Pacemaker) ×2 IMPLANT
SHEATH 8FR PRELUDE SNAP 13 (SHEATH) ×2 IMPLANT
SHEATH PROBE COVER 6X72 (BAG) ×1 IMPLANT
TRAY PACEMAKER INSERTION (PACKS) ×2 IMPLANT
WIRE ACUITY WHISPER EDS 4648 (WIRE) ×3 IMPLANT
WIRE HI TORQ VERSACORE-J 145CM (WIRE) ×1 IMPLANT

## 2020-07-27 NOTE — Progress Notes (Signed)
5 Days Post-Op Procedure(s) (LRB): MEDIASTINAL EXPLORATION (N/A) TRANSESOPHAGEAL ECHOCARDIOGRAM (TEE) (N/A) Subjective: No complaints of chest pain or SOB  Objective: Vital signs in last 24 hours: Temp:  [97.7 F (36.5 C)-98.4 F (36.9 C)] 98.2 F (36.8 C) (03/30 0000) Pulse Rate:  [42-117] 84 (03/30 0700) Cardiac Rhythm: A-V Sequential paced (03/30 0400) Resp:  [18-29] 26 (03/30 0700) BP: (92-130)/(38-77) 115/50 (03/30 0700) SpO2:  [91 %-99 %] 95 % (03/30 0700) Weight:  [84.7 kg] 84.7 kg (03/30 0500)  Hemodynamic parameters for last 24 hours: CVP:  [8 mmHg-9 mmHg] 8 mmHg  Intake/Output from previous day: 03/29 0701 - 03/30 0700 In: 489.3 [P.O.:360; I.V.:3; IV Piggyback:126.3] Out: 2304 [Urine:2302; Stool:2] Intake/Output this shift: No intake/output data recorded.  General appearance: alert and cooperative Neurologic: intact Heart: regular rate and rhythm Lungs: diminished breath sounds LLL Abdomen: soft, non-tender; bowel sounds normal; no masses,  no organomegaly Extremities: edema mild Wound: c/d/i  Lab Results: Recent Labs    07/26/20 0357 07/27/20 0210  WBC 7.5 9.0  HGB 9.1* 9.6*  HCT 27.8* 30.5*  PLT 79* 107*   BMET:  Recent Labs    07/26/20 1202 07/26/20 1401 07/27/20 0210  NA 142  --  144  K 7.0* 4.9 3.8  CL 106  --  106  CO2 29  --  31  GLUCOSE 272*  --  96  BUN 34*  --  34*  CREATININE 1.72*  --  1.68*  CALCIUM 8.0*  --  8.2*    PT/INR: No results for input(s): LABPROT, INR in the last 72 hours. ABG    Component Value Date/Time   PHART 7.442 07/23/2020 0434   HCO3 29.2 (H) 07/23/2020 0434   TCO2 31 07/23/2020 0434   ACIDBASEDEF 1.0 07/22/2020 0814   O2SAT 59.9 07/26/2020 0357   CBG (last 3)  Recent Labs    07/26/20 2338 07/27/20 0006 07/27/20 0307  GLUCAP 163* 134* 87    Assessment/Plan: S/P Procedure(s) (LRB): MEDIASTINAL EXPLORATION (N/A) TRANSESOPHAGEAL ECHOCARDIOGRAM (TEE) (N/A) Mobilize Diuresis I would suggest PPM     LOS: 7 days    [name] 07/27/2020

## 2020-07-27 NOTE — Progress Notes (Signed)
Pt taken down for pacemaker placement.

## 2020-07-27 NOTE — H&P (View-Only) (Signed)
Progress Note  Patient Name: Kristin [name] Date of Encounter: 07/27/2020  [facility] Cardiologist: Dr. Terrence Dupont  Subjective   Feels a little better today  Inpatient Medications    Scheduled Meds:  acetaminophen (TYLENOL) oral liquid 160 mg/5 mL  1,000 mg Oral Q6H   Or   acetaminophen  1,000 mg Oral Q6H   aspirin  81 mg Oral Daily   atorvastatin  80 mg Oral q1800   bisacodyl  10 mg Oral Daily   Or   bisacodyl  10 mg Rectal Daily   Chlorhexidine Gluconate Cloth  6 each Topical Daily   clopidogrel  75 mg Oral Daily   docusate  200 mg Oral Daily   enoxaparin (LOVENOX) injection  30 mg Subcutaneous QHS   feeding supplement  237 mL Oral TID BM   furosemide  80 mg Intravenous BID   insulin aspart  0-24 Units Subcutaneous Q4H   insulin detemir  10 Units Subcutaneous Daily   mouth rinse  15 mL Mouth Rinse BID   megestrol  40 mg Oral Daily   melatonin  3 mg Oral QHS   midodrine  5 mg Oral TID WC   multivitamin with minerals  1 tablet Oral Daily   pantoprazole sodium  40 mg Oral Daily   pneumococcal 23 valent vaccine  0.5 mL Intramuscular Tomorrow-1000   polyethylene glycol  17 g Oral Daily   potassium chloride  40 mEq Oral BID   QUEtiapine  50 mg Oral QHS   sodium chloride flush  3 mL Intravenous Q12H   Continuous Infusions:  sodium chloride     lactated ringers     PRN Meds: dextrose, lactated ringers, magic mouthwash, metoprolol tartrate, morphine injection, ondansetron (ZOFRAN) IV, oxyCODONE, sodium chloride flush, traMADol   Vital Signs    Vitals:   07/27/20 0700 07/27/20 0745 07/27/20 0800 07/27/20 0900  BP: (!) 115/50  (!) 127/55 (!) 108/39  Pulse: 84  (!) 106 99  Resp: (!) '26  18 18  '$ Temp:  98.8 F (37.1 C) 98.8 F (37.1 C)   TempSrc:  Oral Oral   SpO2: 95%  92% 95%  Weight:      Height:        Intake/Output Summary (Last 24 hours) at 07/27/2020 0921 Last data filed at 07/27/2020 0750 Gross per 24 hour  Intake 243 ml  Output 2804 ml  Net -2561  ml   Last 3 Weights 07/27/2020 07/26/2020 07/25/2020  Weight (lbs) 186 lb 12.8 oz 189 lb 2.5 oz 198 lb 3.1 oz  Weight (kg) 84.732 kg 85.8 kg 89.9 kg      Telemetry    AV pacing >> underlying is an accel junctional rhythm 50's-60's - Personally Reviewed  ECG    Yesterday CHB, V pacing - Personally Reviewed  Physical Exam   GEN: No acute distress.   Neck: No JVD Cardiac: RRR, no murmurs, rubs, or gallops.  Respiratory: decreased at the bases. GI: Soft, nontender, non-distended  MS:  trace edema; No deformity. Neuro:  Nonfocal  Psych: Normal affect   Labs    High Sensitivity Troponin:   Recent Labs  Lab 07/20/20 1538 07/20/20 1729  TROPONINIHS 3,198* 3,282*      Chemistry Recent Labs  Lab 07/26/20 0357 07/26/20 1202 07/26/20 1401 07/27/20 0210  NA 145 142  --  144  K 3.2* 7.0* 4.9 3.8  CL 111 106  --  106  CO2 28 29  --  31  GLUCOSE 122*  272*  --  96  BUN 30* 34*  --  34*  CREATININE 1.44* 1.72*  --  1.68*  CALCIUM 7.3* 8.0*  --  8.2*  PROT 4.2*  --   --   --   ALBUMIN 2.5*  --   --   --   AST 321*  --   --   --   ALT 938*  --   --   --   ALKPHOS 94  --   --   --   BILITOT 1.3*  --   --   --   GFRNONAA 37* 30*  --  31*  ANIONGAP 6 7  --  7     Hematology Recent Labs  Lab 07/25/20 0257 07/26/20 0357 07/27/20 0210  WBC 8.3 7.5 9.0  RBC 2.79* 2.88* 3.09*  HGB 8.6* 9.1* 9.6*  HCT 26.9* 27.8* 30.5*  MCV 96.4 96.5 98.7  MCH 30.8 31.6 31.1  MCHC 32.0 32.7 31.5  RDW 16.7* 16.7* 17.1*  PLT 64* 79* 107*    BNPNo results for input(s): BNP, PROBNP in the last 168 hours.   DDimer No results for input(s): DDIMER in the last 168 hours.   Radiology    07/27/20 CXR IMPRESSION: Small bibasilar pleural effusions and atelectasis.  Cardiac Studies   07/22/20: TEE LEFT VENTRICLE: EF = 40-45%.The LV has marked LVH and is underfilled.There is external compression of the LV from a fluid collection posterior an lateral to the heart.  RIGHT VENTRICLE: Moderate  HK + swan LEFT ATRIUM: Dilated. Mild external compression  LEFT ATRIAL APPENDAGE: Not visualized RIGHT ATRIUM: Dialted AORTIC VALVE:  Trileaflet. No AI/AS MITRAL VALVE:    Normal. Trivial MR TRICUSPID VALVE: Normal. Trivial TR PULMONIC VALVE: Normal INTERATRIAL SEPTUM: Not well visualized PERICARDIUM: Large echodense collection posterior and lateral to heart.  DESCENDING AORTA: Not visualized     07/21/20: LHC Mid LM lesion is 95% stenosed. Ost Cx lesion is 99% stenosed. Dist LM to Ost LAD lesion is 95% stenosed. Mid LAD lesion is 95% stenosed. Prox Cx lesion is 90% stenosed. Prox RCA to Mid RCA lesion is 80% stenosed. RPDA lesion is 80% stenosed. RPAV lesion is 80% stenosed. Ost LM lesion is 30% stenosed.         07/20/20; TTE IMPRESSIONS   1. Left ventricular ejection fraction, by estimation, is 40 to 45%. The  left ventricle has mild to moderately decreased function. The left  ventricle demonstrates regional wall motion abnormalities (see scoring  diagram/findings for description). Left  ventricular diastolic parameters are consistent with Grade I diastolic  dysfunction (impaired relaxation).   2. Right ventricular systolic function is normal. The right ventricular  size is normal.   3. The mitral valve is normal in structure. Mild mitral valve  regurgitation.   4. The aortic valve is calcified. Aortic valve regurgitation is not  visualized. Mild aortic valve sclerosis is present, with no evidence of  aortic valve stenosis.   5. The inferior vena cava is normal in size with <50% respiratory  variability, suggesting right atrial pressure of 8 mmHg.     Patient Profile     80 y.o. female with a hx of DM, obesity, HTN admitted with NSTEMI > LHC noted critical LM and Multivessel CAD > IABP > emergent CABG 07/22/20 (x5 (LIMA to LAD, SEQ SVG to OM and Diagonal, SEQ to PL and PDA)  Admitted with NSTEMI > LHC noted critical LM disease > IABP > emergent CABG 07/21/20,  Post op  shock with suspect tamponade, TEE confirmed  Post op with tamponade and brought back to the OR 07/22/20   Extubated 07/22/20, epi 3, milrinone 0.125, and VP 0.02 3/26 very agitated, remained on NE 3, milrinone 0.125 and precedex 07/24/20 remained confused yesterday CO-OX 65%. Norepi 4 mcg + milrinone 0.125 mcg Na lower 145      Plts 47K => 50=>64K.  Creatinine 1.1 -> 1.4 -> 1.7 -> 1.9 -> 2.01->1.7  Today  CT out yesterday Off inotrope/pressors   Assessment & Plan    1. P/o CHB     Today is POD #6 CABG                    POD #5 re-exploration/tamponade evacuation     No nodal blockers on board     She is AV pacing via epicardial wires.  Dr. Lovena Le has seen and examined the patient She felt poorly with VVI back up pacing yesterday when her junctional rhythm slowed. DDD pacing was SR/VPacing This morning with interruption in her pacing she was again in CHB V rate 28-30bpm  She was seen by HF team and her pacing turned down finding the same She has now settled into AFlutter w/V pacing at 41  Dr. [name] felt pacing PPM would be needed, discussed with the patient and she is agreeable LVEF 40-45%, baseline LBBB Plan for CRT-P     2. Cardiogenic shock post-op (tamponade)   Heart failure team on board   Off pressors   On midodrine 3. CAD   Post CABG 4. AKI   Creat waxing/waning 5. P/o anemia, thrombocytopenia    H/H stable, plts up some more > 107 this AM  6. New Aflutter     Nakaila Freeze need to start/consider a/c post pacing tomorrow    For questions or updates, please contact [place] Please consult [email or web address] for contact info under      Signed, Baldwin [place], PA-C  07/27/2020, 9:21 AM    I have seen and examined this patient with Tommye Standard.  Agree with above, note added to reflect my findings.  On exam, RRR, no murmurs, lungs clear. Patient remains V paced. Orvie Caradine plan for pacemaker implant.  GARLA GASSERT has presented today for surgery, with the  diagnosis of complete AV block.  The various methods of treatment have been discussed with the patient and family. After consideration of risks, benefits and other options for treatment, the patient has consented to  Procedure(s): Pacemaker implant as a surgical intervention .  Risks include but not limited to bleeding, infection, pneumothorax, perforation, tamponade, vascular damage, renal failure, MI, stroke, death, and lead dislodgement . The patient's history has been reviewed, patient examined, no change in status, stable for surgery.  I have reviewed the patient's chart and labs.  Questions were answered to the patient's satisfaction.    Claritza July [name], MD 07/27/2020 10:51 AM     Temprance Wyre M. Dashea Mcmullan MD 07/27/2020 10:51 AM

## 2020-07-27 NOTE — Interval H&P Note (Signed)
History and Physical Interval Note:  07/27/2020 10:52 AM  Kristin [name]  has presented today for surgery, with the diagnosis of heart block.  The various methods of treatment have been discussed with the patient and family. After consideration of risks, benefits and other options for treatment, the patient has consented to  Procedure(s): BIV PACEMAKER INSERTION CRT-P (N/A) as a surgical intervention.  The patient's history has been reviewed, patient examined, no change in status, stable for surgery.  I have reviewed the patient's chart and labs.  Questions were answered to the patient's satisfaction.     [name] Stephanie [employer]

## 2020-07-27 NOTE — Progress Notes (Signed)
Pt arrived from unit from cath lab pacemaker placed on left chest Clean dry and intact  CHG . Will continue to monitor. Pt. Oriented to unit   Phoebe Sharps, RN     07/27/20 1814  Vitals  Temp 98.9 F (37.2 C)  Temp Source Oral  BP (!) 139/56  MAP (mmHg) 81  BP Location Right Arm  BP Method Automatic  Patient Position (if appropriate) Lying  Pulse Rate 82  Pulse Rate Source Monitor  Resp 15  Level of Consciousness  Level of Consciousness Alert  Oxygen Therapy  SpO2 93 %  O2 Device Room Air  MEWS Score  MEWS Temp 0  MEWS Systolic 0  MEWS Pulse 0  MEWS RR 0  MEWS LOC 0  MEWS Score 0  MEWS Score Color Green

## 2020-07-27 NOTE — Progress Notes (Signed)
NAME:  Kristin [name], MRN:  [id number], DOB:  05/02/1940, LOS: 7 ADMISSION DATE:  07/20/2020, CONSULTATION DATE:  07/25/2020 REFERRING MD:  Orvan Seen - TCTS, CHIEF COMPLAINT:  Delirium   History of Present Illness:  80 year old woman who was delirious following urgent CABG following NSTEMI  Pertinent  Medical History  HTN, DM, Depression.  Significant Hospital Events: Including procedures, antibiotic start and stop dates in addition to other pertinent events   3/23 transfer from [facility] - 3V CAD with LM occlusion. EF 45%. IABP placed.  3/24 underwent CABGx5 LIMA to LAD, SVG to OM and D1, SVG PL PDA 3/25 re-exploration for regional tamponade 3/26 Extubated  Delirium requiring Precedex 3/30 PPM planned by EP.  PCCM signoff  Interim History / Subjective:  NAEON.  PPM planned for this afternoon.  Objective   Blood pressure (!) 115/50, pulse 84, temperature 98.8 F (37.1 C), temperature source Oral, resp. rate (!) 26, height '5\' 6"'$  (1.676 m), weight 84.7 kg, SpO2 95 %. CVP:  [8 mmHg] 8 mmHg      Intake/Output Summary (Last 24 hours) at 07/27/2020 0830 Last data filed at 07/27/2020 0750 Gross per 24 hour  Intake 369.28 ml  Output 2804 ml  Net -2434.72 ml   Filed Weights   07/25/20 0500 07/26/20 0500 07/27/20 0500  Weight: 89.9 kg 85.8 kg 84.7 kg    Examination: General: Adult female, sitting in recliner, in NAD. Neuro: A&O x 3, no deficits. HEENT: [place]/AT. Sclerae anicteric. EOMI. Cardiovascular: Sternotomy incision healing nicely. RRR, no M/R/G.  Lungs: Respirations even and unlabored.  CTA bilaterally, No W/R/R.  Abdomen: BS x 4, soft, NT/ND.  Musculoskeletal: No gross deformities, no edema.  Skin: Intact, warm, no rashes.    Resolved Hospital Problem list   NSTEMI Cardiac tamponade Acute respiratory failure   Assessment & Plan:   CAD status post CABG c/b tamponade s/p mediastinal exploration Critically ill due to severe bradycardia requiring pacing Delirium with  sundowning - resolving AKI Remains critically ill due to Ischemic cardiomyopathy with moderate LV dysfunction requiring titration of milrinone and NE  Overweight Hx HTN  Hx Diabetes type 2 Thrombocytopenia due to IABP and recent CPB  Plan:  - Per TCTS - PPM per EP today - Continue Seroquel at bedtime to prevent agitated delirium and wean to off.  Transferring out of ICU should also further help improve delirium. - AKI improving - continue judicious diuresis per HF, although patient is currently on room air, so shouldn't require much more diuresis. - Initiate secondary cardiac prevention - would recommend stopping Actos and starting metformin and GLP-1 prior to discharge.  - Continue Megace for appetite stimulation.  - Will likely need in patient rehabilitation.    Rest per primary team.  Nothing further to add.  PCCM will sign off.  Please do not hesitate to call us back if we can be of any further assistance.  Best practice (right click and "Reselect all SmartList Selections" daily)  Diet:  Oral - started megace.  Pain/Anxiety/Delirium protocol (if indicated): No Seroquel qhs VAP protocol (if indicated): Not indicated DVT prophylaxis: LMWH and SCD Lovenox.  GI prophylaxis: N/A Glucose control:  SSI Yes Central venous access:  N/A Arterial line:  N/A Foley:  Yes, and it is still needed Mobility:  OOB  PT consulted: Yes Last date of multidisciplinary goals of care discussion [per TCTS] Code Status:  full code Disposition: ICU   Montey Hora, [place]  For pager details, please see AMION If no response to pager, please call [phone] until 7:00 PM After 7:00 PM, please call Elink at [phone] 07/27/2020, 8:38 AM

## 2020-07-27 NOTE — Progress Notes (Signed)
Patient ID: Kristin [name], female   DOB: [birth year], 80 y.o.   MRN: [id number]    Cardiologist: Terrence Dupont  Advanced Heart Failure Rounding Note   Subjective:    - 3/24 NSTEMI - 3/24 Emergent CABG x 5 (LIMA to LAD, SEQ SVG to OM and Diagonal, SEQ to PL and PDA) - 3/25 take back to OR for tamponade. EF on TEE 55%, RV mildly down (3/25) -3/26 Extubated -3/28 Milrinone stopped. Midodrine started.   Sitting in chair. Nauseated. Remains paced at 50. Weight still up. No CP.   Remains v-paced at 30. Underlying rhythms seem to fluctuate between AFL and sinus with periods of CHB. When I turned pacer off she was sinus with long first degree in 40s. Then had CHB,   Objective:   Weight Range:  Vital Signs:   Temp:  [97.7 F (36.5 C)-98.8 F (37.1 C)] 98.8 F (37.1 C) (03/30 0745) Pulse Rate:  [42-117] 84 (03/30 0700) Resp:  [18-29] 26 (03/30 0700) BP: (92-130)/(38-77) 115/50 (03/30 0700) SpO2:  [91 %-99 %] 95 % (03/30 0700) Weight:  [84.7 kg] 84.7 kg (03/30 0500) Last BM Date: 07/26/20  Weight change: Filed Weights   07/25/20 0500 07/26/20 0500 07/27/20 0500  Weight: 89.9 kg 85.8 kg 84.7 kg    Intake/Output:   Intake/Output Summary (Last 24 hours) at 07/27/2020 0838 Last data filed at 07/27/2020 0750 Gross per 24 hour  Intake 369.28 ml  Output 2804 ml  Net -2434.72 ml     CVP 8-9 Physical Exam: General:  Sitting in chair nauseated HEENT: normal Neck: supple. JVP 8-9. Carotids 2+ bilat; no bruits. No lymphadenopathy or thryomegaly appreciated. Cor: PMI nondisplaced.sternal wound with mild swelling and ecchymosis at superior portion Lungs: decreased LLL Abdomen: soft, nontender, nondistended. No hepatosplenomegaly. No bruits or masses. Good bowel sounds. Extremities: no cyanosis, clubbing, rash, tr edema Neuro: alert & orientedx3, cranial nerves grossly intact. moves all 4 extremities w/o difficulty. Affect pleasant   Telemetry: Remains v-paced at 50. Underlying rhythms  seem to fluctuate between AFL and sinus with periods of CHB. When I turned pacer off she was sinus with long first degree in 40s. Then had CHB,   Labs: Basic Metabolic Panel: Recent Labs  Lab 07/22/20 0404 07/22/20 0422 07/22/20 1700 07/23/20 0300 07/25/20 0257 07/25/20 1032 07/25/20 1555 07/26/20 0357 07/26/20 1202 07/26/20 1401 07/27/20 0210  NA 150*   < > 150*   < > 145  --  145 145 142  --  144  K 4.0   < > 3.6   < > 3.4*  --  3.4* 3.2* 7.0* 4.9 3.8  CL 111  --  114*   < > 111  --  108 111 106  --  106  CO2 20*  --  27   < > 30  --  '31 28 29  '$ --  31  GLUCOSE 124*  --  148*   < > 96  --  119* 122* 272*  --  96  BUN 19  --  24*   < > 35*  --  33* 30* 34*  --  34*  CREATININE 1.70*  --  1.91*   < > 1.77*  --  1.65* 1.44* 1.72*  --  1.68*  CALCIUM 7.5*  --  8.3*   < > 7.8*  --  8.2* 7.3* 8.0*  --  8.2*  MG 2.5*  --  2.4  --   --  1.8  --  1.8  --   --  2.3   < > = values in this interval not displayed.    Liver Function Tests: Recent Labs  Lab 07/26/20 0357  AST 321*  ALT 938*  ALKPHOS 94  BILITOT 1.3*  PROT 4.2*  ALBUMIN 2.5*   No results for input(s): LIPASE, AMYLASE in the last 168 hours. No results for input(s): AMMONIA in the last 168 hours.  CBC: Recent Labs  Lab 07/23/20 0300 07/23/20 0434 07/24/20 0315 07/25/20 0257 07/26/20 0357 07/27/20 0210  WBC 9.7  --  10.5 8.3 7.5 9.0  HGB 9.6* 8.5* 8.6* 8.6* 9.1* 9.6*  HCT 28.0* 25.0* 26.5* 26.9* 27.8* 30.5*  MCV 90.6  --  95.3 96.4 96.5 98.7  PLT 47*  --  50* 64* 79* 107*    Cardiac Enzymes: No results for input(s): CKTOTAL, CKMB, CKMBINDEX, TROPONINI in the last 168 hours.  BNP: BNP (last 3 results) No results for input(s): BNP in the last 8760 hours.  ProBNP (last 3 results) No results for input(s): PROBNP in the last 8760 hours.    Other results:  Imaging: DG Chest 1 View  Result Date: 07/27/2020 CLINICAL DATA:  Shortness of breath, pre pacemaker placement EXAM: CHEST  1 VIEW COMPARISON:   Portable exam 0810 hours compared to 07/26/2020 FINDINGS: Borderline enlargement of cardiac silhouette post median sternotomy. Mediastinal contours and pulmonary vascularity normal. Bibasilar atelectasis and small pleural effusions. No segmental consolidation or pneumothorax. Osseous demineralization. IMPRESSION: Small bibasilar pleural effusions and atelectasis. Electronically Signed   By: Lavonia Dana M.D.   On: 07/27/2020 08:31   DG Chest 1 View  Result Date: 07/26/2020 CLINICAL DATA:  Sore chest.  Status post open heart surgery. EXAM: CHEST  1 VIEW COMPARISON:  07/24/2020. FINDINGS: Interval removal of 2 left chest tubes. Right IJ sheath in stable position. Prior median sternotomy. Stable cardiomegaly. Low lung volumes with persistent bibasilar atelectasis. Bibasilar infiltrates/edema cannot be excluded. Small right pleural effusion again noted. Tiny left pleural effusion cannot be excluded. No pneumothorax. IMPRESSION: 1. Interval removal of 2 left chest tubes. No pneumothorax. Right IJ sheath in stable position. 2. Prior median sternotomy. Stable cardiomegaly. 3. Low lung volumes with persistent bibasilar atelectasis. Bibasilar infiltrates/edema cannot be excluded. Small right pleural effusion again noted. Tiny left pleural effusion cannot be excluded. Electronically Signed   By: Marcello Moores  Register   On: 07/26/2020 05:53     Medications:     Scheduled Medications: . acetaminophen (TYLENOL) oral liquid 160 mg/5 mL  1,000 mg Oral Q6H   Or  . acetaminophen  1,000 mg Oral Q6H  . aspirin  81 mg Oral Daily  . atorvastatin  80 mg Oral q1800  . bisacodyl  10 mg Oral Daily   Or  . bisacodyl  10 mg Rectal Daily  . Chlorhexidine Gluconate Cloth  6 each Topical Daily  . clopidogrel  75 mg Oral Daily  . docusate  200 mg Oral Daily  . enoxaparin (LOVENOX) injection  30 mg Subcutaneous QHS  . feeding supplement  237 mL Oral TID BM  . furosemide  80 mg Intravenous BID  . insulin aspart  0-24 Units  Subcutaneous Q4H  . insulin detemir  10 Units Subcutaneous Daily  . mouth rinse  15 mL Mouth Rinse BID  . megestrol  40 mg Oral Daily  . melatonin  3 mg Oral QHS  . midodrine  5 mg Oral TID WC  . multivitamin with minerals  1 tablet Oral Daily  . pantoprazole sodium  40 mg Oral Daily  .  pneumococcal 23 valent vaccine  0.5 mL Intramuscular Tomorrow-1000  . polyethylene glycol  17 g Oral Daily  . QUEtiapine  50 mg Oral QHS  . sodium chloride flush  3 mL Intravenous Q12H    Infusions: . sodium chloride    . lactated ringers      PRN Medications: dextrose, lactated ringers, magic mouthwash, metoprolol tartrate, morphine injection, ondansetron (ZOFRAN) IV, oxyCODONE, sodium chloride flush, traMADol   Assessment/Plan:   1. Cardiogenic shock - s/p CABG 3/24 with take back for tamponade on 3/25 - LVEF 55% on post-op TEE 3/25 - Off pressors. - CVP 8-10. Continue IV lasix today. Supp K  - Continue midodrine 5 mg three times a day.    2. CAD s/p NSTEMI  - CABG x 5 3/24 - no evidence of ongoing ischemia - continue DAPT, statin. No bb with shock.  - Will SGT2i soon.   3. Acute hypoxic respiratory failure post-op - stable on RA  4. AKI - baseline creatinine 1.1 -> peaked at 2, today 1.4-> 1.7 - due to shock/ATN  5. Acute blood loss anemia, post -op - hgb stable at 9.6  6. Hypernatremia -Sodium 1454  7. Low platelets - Plts trending up 50> 64>79K.-> 107k  8. Dysphagia Speech following  Crush meds with small sips.   9. Deconditioning Consult PT/OT  10 CHB - EP consulted. Plan for CRT-P today  Length of Stay: 7   [name] MD 07/27/2020, 8:38 AM  Advanced Heart Failure Team Pager [phone] (M-F; [place])  Please contact [facility] for night-coverage after hours (4p -7a ) and weekends on [email or web address]

## 2020-07-27 NOTE — Progress Notes (Signed)
Inpatient Diabetes Program Recommendations  AACE/ADA: New Consensus Statement on Inpatient Glycemic Control   Target Ranges:  Prepandial:   less than 140 mg/dL      Peak postprandial:   less than 180 mg/dL (1-2 hours)      Critically ill patients:  140 - 180 mg/dL   Results for Kristin [name], Kristin [name] (MRN [id number]) as of 07/27/2020 10:47  Ref. Range 07/26/2020 07:49 07/26/2020 12:27 07/26/2020 15:46 07/26/2020 20:43 07/26/2020 23:38 07/27/2020 00:06 07/27/2020 03:07 07/27/2020 07:39  Glucose-Capillary Latest Ref Range: 70 - 99 mg/dL 176 (H)  Novolog 4 units  Levemir 10 units 288 (H)  Novolog 12 units 327 (H)  Novolog 16 units 242 (H)  Novolog 8 units 163 (H)  Novolog 2 units 134 (H) 87 152 (H)  Novolog 2 units   Review of Glycemic Control  Diabetes history: DM2 Outpatient Diabetes medications: Amaryl 1 mg QAM, Levemir 42 units daily, Actos 30 mg daily Current orders for Inpatient glycemic control: Levemir 10 units daily, Novolog 0-24 units Q4H  Inpatient Diabetes Program Recommendations:    Insulin: Please consider ordering Novolog 5 units TID with meals for meal coverage if patient eats at least 50% of meals.  Thanks, Barnie Alderman, RN, MSN, CDE Diabetes Coordinator Inpatient Diabetes Program [phone] (Team Pager from 8am to 5pm)

## 2020-07-27 NOTE — Progress Notes (Signed)
Subjective:  Patient up in chair.  Complains of nausea and upset stomach.  Denies any chest pain or shortness of breath.  He remains pacemaker dependent, scheduled for permanent pacemaker today  Objective:  Vital Signs in the last 24 hours: Temp:  [97.7 F (36.5 C)-98.8 F (37.1 C)] 98.8 F (37.1 C) (03/30 0745) Pulse Rate:  [42-117] 84 (03/30 0700) Resp:  [18-29] 26 (03/30 0700) BP: (94-130)/(38-77) 115/50 (03/30 0700) SpO2:  [91 %-99 %] 95 % (03/30 0700) Weight:  [84.7 kg] 84.7 kg (03/30 0500)  Intake/Output from previous day: 03/29 0701 - 03/30 0700 In: 489.3 [P.O.:360; I.V.:3; IV Piggyback:126.3] Out: 2304 [Urine:2302; Stool:2] Intake/Output from this shift: Total I/O In: -  Out: 500 [Urine:500]  Physical Exam: Neck: no adenopathy, no carotid bruit, no JVD and supple, symmetrical, trachea midline Lungs: decreased breath sounds at bases.  Air entry has improved clear anterolaterally Heart: ventricular paced rhythm on the monitor.  S1, S2, soft Abdomen: soft, non-tender; bowel sounds normal; no masses,  no organomegaly Extremities: extremities normal, atraumatic, no cyanosis or edema  Lab Results: Recent Labs    07/26/20 0357 07/27/20 0210  WBC 7.5 9.0  HGB 9.1* 9.6*  PLT 79* 107*   Recent Labs    07/26/20 1202 07/26/20 1401 07/27/20 0210  NA 142  --  144  K 7.0* 4.9 3.8  CL 106  --  106  CO2 29  --  31  GLUCOSE 272*  --  96  BUN 34*  --  34*  CREATININE 1.72*  --  1.68*   No results for input(s): TROPONINI in the last 72 hours.  Invalid input(s): CK, MB Hepatic Function Panel Recent Labs    07/26/20 0357  PROT 4.2*  ALBUMIN 2.5*  AST 321*  ALT 938*  ALKPHOS 94  BILITOT 1.3*   No results for input(s): CHOL in the last 72 hours. No results for input(s): PROTIME in the last 72 hours.  Imaging: Imaging results have been reviewed and DG Chest 1 View  Result Date: 07/27/2020 CLINICAL DATA:  Shortness of breath, pre pacemaker placement EXAM: CHEST   1 VIEW COMPARISON:  Portable exam 0810 hours compared to 07/26/2020 FINDINGS: Borderline enlargement of cardiac silhouette post median sternotomy. Mediastinal contours and pulmonary vascularity normal. Bibasilar atelectasis and small pleural effusions. No segmental consolidation or pneumothorax. Osseous demineralization. IMPRESSION: Small bibasilar pleural effusions and atelectasis. Electronically Signed   By: Lavonia Dana M.D.   On: 07/27/2020 08:31   DG Chest 1 View  Result Date: 07/26/2020 CLINICAL DATA:  Sore chest.  Status post open heart surgery. EXAM: CHEST  1 VIEW COMPARISON:  07/24/2020. FINDINGS: Interval removal of 2 left chest tubes. Right IJ sheath in stable position. Prior median sternotomy. Stable cardiomegaly. Low lung volumes with persistent bibasilar atelectasis. Bibasilar infiltrates/edema cannot be excluded. Small right pleural effusion again noted. Tiny left pleural effusion cannot be excluded. No pneumothorax. IMPRESSION: 1. Interval removal of 2 left chest tubes. No pneumothorax. Right IJ sheath in stable position. 2. Prior median sternotomy. Stable cardiomegaly. 3. Low lung volumes with persistent bibasilar atelectasis. Bibasilar infiltrates/edema cannot be excluded. Small right pleural effusion again noted. Tiny left pleural effusion cannot be excluded. Electronically Signed   By: Marcello Moores  Register   On: 07/26/2020 05:53    Cardiac Studies:  Assessment/Plan:  Acute non-Q wave myocardial infarction status post left cardiac catheterization noted to have three-vessel coronary artery disease status post CABG x5 complicated postoperatively by cardiac tamponade requiring evacuation of the hematomapostop day6doing  well Status post acute respiratory failure status post extubation tolerating well on nasal cannula Status post transient asystole now pacemaker dependent Hypertension Diabetes mellitus Postop anemia Thrombocytopenia improved Acute on chronic kidney injuryimproved Morbid  obesity Status post CCU psychosis Status postHypokalemia. Plan Continue present management. Scheduled for permanent pacemaker today  LOS: 7 days    Charolette Forward 07/27/2020, 9:14 AM

## 2020-07-27 NOTE — Progress Notes (Signed)
Report given to 4E bed 3 RN Tammy. Pt remains in cath lab and will go to 4E03 after procedure. All belongings taken to new room

## 2020-07-27 NOTE — PMR Pre-admission (Shared)
PMR Admission Coordinator Pre-Admission Assessment  Patient: Kristin [name] is an 80 y.o., female MRN: [id number] DOB: Jul 08, 1940 Height: '5\' 6"'  (167.6 cm) Weight: 84.7 kg  Insurance Information HMO: yes    PPO:      PCP:      IPA:      80/20:      OTHER:  PRIMARY: [employer] Gold Plus     CHENID#:[id number]      Subscriber: Pt. CM Name: ***      Phone#: ***     Fax#: [phone] Pre-Cert#: ***      Employer: Retired Benefits:  Phone: n/a - online at [email or web address] Eff Date: 05/01/2019- still active Deductible: does not have OOP Max: $3,900 ($0 met) CIR: $295/day co-pay with a max co-pay of $1,770/admission (6 days) SNF: 100% coverage Outpatient:  $10-$20/visit co-pay Home Health:  100% coverage DME: 80% coverage; 20% co-insurance Providers: in network  SECONDARY: none       Policy#:      Phone#:   Development worker, community:       Phone#:   [employer], [employer]" for patients in Inpatient Rehabilitation Facilities with attached "Privacy Act [place] Records" was provided and verbally reviewed with: Family  Emergency Contact Information Contact Information    Name Relation Home Work [place] Daughter [phone]  [phone]      Current Medical History  Patient Admitting Diagnosis: Debility, NSTEMI, CABG   History of Present Illness: Kristin [name] is a 80 y/o female with PMH of DM,HTN, HLD, and history of gallbladder surgery who was admitted 07/20/20  from [facility]  with  RSCP radiating to left arm on background of crescendo DOE for the past few months. Found to have 3V CAD with distal LM occlusion. emergent cath then to ICU on 07/21/20. Pt with NSTEMI and underwent CABGx5 on 07/22/20. Post-op tamponade requiring return to OR on 3/25 for hematoma evacuation and then extubated on 3/26. Pt. experienced confusion following extubation and required precedex and then seroquel to manage.  Patient's medical record from [facility] has been reviewed by the rehabilitation admission coordinator and physician.  Past Medical History  Past Medical History:  Diagnosis Date  . Depression   . Diabetes mellitus without complication ([place])   . Hypertension   . Night sweats     Family History   family history includes Diabetes in her mother; Heart attack in her father and mother.  Prior Rehab/Hospitalizations Has the patient had prior rehab or hospitalizations prior to admission? Yes and No  Has the patient had major surgery during 100 days prior to admission? Yes   Current Medications  Current Facility-Administered Medications:  .  0.9 %  sodium chloride infusion, 250 mL, Intravenous, Continuous, Barrett, Erin R, PA-C .  acetaminophen (TYLENOL) tablet 1,000 mg, 1,000 mg, Oral, Q6H, 1,000 mg at 07/26/20 0811 **OR** acetaminophen (TYLENOL) 160 MG/5ML solution 1,000 mg, 1,000 mg, Oral, Q6H, Atkins, Glenice Bow, MD, 1,000 mg at 07/27/20 0302 .  aspirin chewable tablet 81 mg, 81 mg, Oral, Daily, [name], MD, 81 mg at 07/26/20 0811 .  atorvastatin (LIPITOR) tablet 80 mg, 80 mg, Oral, q1800, [name], MD, 80 mg at 07/26/20 1641 .  bisacodyl (DULCOLAX) EC tablet 10 mg, 10 mg, Oral, Daily, 10 mg at 07/26/20 0811 **OR** bisacodyl (DULCOLAX) suppository 10 mg, 10 mg, Rectal, Daily, Barrett, Erin R, PA-C .  Chlorhexidine Gluconate Cloth 2 % PADS 6 each, 6 each, Topical, Daily, Atkins, Broadus  Z, MD, 6 each at 07/27/20 0630 .  clopidogrel (PLAVIX) tablet 75 mg, 75 mg, Oral, Daily, [name], MD, 75 mg at 07/26/20 0811 .  dextrose 50 % solution 0-50 mL, 0-50 mL, Intravenous, PRN, Barrett, Erin R, PA-C .  docusate (COLACE) 50 MG/5ML liquid 200 mg, 200 mg, Oral, Daily, Orvan Seen, Glenice Bow, MD, 200 mg at 07/26/20 0812 .  enoxaparin (LOVENOX) injection 30 mg, 30 mg, Subcutaneous, QHS, Lightfoot, Harrell O, MD, 30 mg at 07/26/20 2108 .  feeding supplement (ENSURE ENLIVE / ENSURE PLUS) liquid 237 mL, 237 mL,  Oral, TID BM, Atkins, Broadus Z, MD, 237 mL at 07/26/20 2046 .  furosemide (LASIX) injection 80 mg, 80 mg, Intravenous, BID, Clegg, Amy D, NP, 80 mg at 07/26/20 1645 .  CBG monitoring, , , Q4H **AND** insulin aspart (novoLOG) injection 0-24 Units, 0-24 Units, Subcutaneous, Q4H, Lightfoot, Lucile Crater, MD, 2 Units at 07/27/20 0010 .  insulin detemir (LEVEMIR) injection 10 Units, 10 Units, Subcutaneous, Daily, Lajuana Matte, MD, 10 Units at 07/26/20 0826 .  lactated ringers infusion 500 mL, 500 mL, Intravenous, Once PRN, Barrett, Erin R, PA-C .  magic mouthwash, 15 mL, Oral, TID PRN, Clegg, Amy D, NP, 15 mL at 07/26/20 1143 .  MEDLINE mouth rinse, 15 mL, Mouth Rinse, BID, Orvan Seen, Glenice Bow, MD, 15 mL at 07/26/20 2116 .  megestrol (MEGACE) tablet 40 mg, 40 mg, Oral, Daily, Agarwala, Ravi, MD, 40 mg at 07/26/20 1137 .  melatonin tablet 3 mg, 3 mg, Oral, QHS, Atkins, Glenice Bow, MD, 3 mg at 07/26/20 2106 .  metoprolol tartrate (LOPRESSOR) injection 2.5-5 mg, 2.5-5 mg, Intravenous, Q2H PRN, Barrett, Erin R, PA-C .  midodrine (PROAMATINE) tablet 5 mg, 5 mg, Oral, TID WC, Atkins, Glenice Bow, MD, 5 mg at 07/26/20 1641 .  morphine 2 MG/ML injection 1-4 mg, 1-4 mg, Intravenous, Q1H PRN, Barrett, Erin R, PA-C, 2 mg at 07/22/20 1947 .  multivitamin with minerals tablet 1 tablet, 1 tablet, Oral, Daily, Orvan Seen, Glenice Bow, MD, 1 tablet at 07/26/20 0828 .  ondansetron (ZOFRAN) injection 4 mg, 4 mg, Intravenous, Q6H PRN, Barrett, Erin R, PA-C .  oxyCODONE (Oxy IR/ROXICODONE) immediate release tablet 5-10 mg, 5-10 mg, Oral, Q3H PRN, Atkins, Broadus Z, MD .  pantoprazole sodium (PROTONIX) 40 mg/20 mL oral suspension 40 mg, 40 mg, Oral, Daily, [name], MD, 40 mg at 07/26/20 1914 .  pneumococcal 23 valent vaccine (PNEUMOVAX-23) injection 0.5 mL, 0.5 mL, Intramuscular, Tomorrow-1000, Atkins, Broadus Z, MD .  polyethylene glycol (MIRALAX / GLYCOLAX) packet 17 g, 17 g, Oral, Daily, Clegg, Amy D, NP, 17 g at  07/26/20 0812 .  QUEtiapine (SEROQUEL) tablet 50 mg, 50 mg, Oral, QHS, Atkins, Glenice Bow, MD, 50 mg at 07/26/20 2106 .  sodium chloride flush (NS) 0.9 % injection 3 mL, 3 mL, Intravenous, Q12H, Barrett, Erin R, PA-C, 3 mL at 07/26/20 2109 .  sodium chloride flush (NS) 0.9 % injection 3 mL, 3 mL, Intravenous, PRN, Barrett, Erin R, PA-C .  traMADol (ULTRAM) tablet 50-100 mg, 50-100 mg, Oral, Q4H PRN, Orvan Seen, Glenice Bow, MD  Patients Current Diet:  Diet Order            Diet NPO time specified Except for: Sips with Meds  Diet effective midnight                 Precautions / Restrictions Precautions Precautions: Sternal,Fall,Other (comment) Precaution Booklet Issued: Yes (comment) Precaution Comments: external pacer Restrictions Weight Bearing Restrictions:  No   Has the patient had 2 or more falls or a fall with injury in the past year? No  Prior Activity Level Community (5-7x/wk): Pt. active in the community PTA  Prior Functional Level Self Care: Did the patient need help bathing, dressing, using the toilet or eating? Independent  Indoor Mobility: Did the patient need assistance with walking from room to room (with or without device)? Independent  Stairs: Did the patient need assistance with internal or external stairs (with or without device)? Independent  Functional Cognition: Did the patient need help planning regular tasks such as shopping or remembering to take medications? Independent  Home Assistive Devices / Equipment Home Assistive Devices/Equipment: None Home Equipment: Walker - 2 wheels,Cane - single point,Shower seat  Prior Device Use: Indicate devices/aids used by the patient prior to current illness, exacerbation or injury? None of the above  Current Functional Level Cognition  Overall Cognitive Status: Within Functional Limits for tasks assessed Orientation Level: Oriented X4 General Comments: A&Ox4. Follows commands consistently with increased time     Extremity Assessment (includes Sensation/Coordination)  Upper Extremity Assessment: Generalized weakness  Lower Extremity Assessment: Defer to PT evaluation    ADLs  Overall ADL's : Needs assistance/impaired Eating/Feeding: Set up,Sitting Grooming: Wash/dry face,Set up,Sitting Upper Body Bathing: Moderate assistance Lower Body Bathing: Maximal assistance Upper Body Dressing : Maximal assistance,Cueing for compensatory techniques Lower Body Dressing: Maximal assistance,Sit to/from stand Toilet Transfer: Minimal assistance,+2 for physical assistance,+2 for safety/equipment Toilet Transfer Details (indicate cue type and reason): cues for safe hand placement Toileting- Clothing Manipulation and Hygiene: Total assistance Functional mobility during ADLs: Minimal assistance,+2 for physical assistance,+2 for safety/equipment,Cueing for safety,Cueing for sequencing,Rolling walker General ADL Comments: needs continued education for sternal precautions with ADL focus    Mobility  Overal bed mobility: Needs Assistance Bed Mobility: Sit to Supine Sit to supine: Mod assist General bed mobility comments: Mod Ax1 for LE management    Transfers  Overall transfer level: Needs assistance Equipment used: Rolling walker (2 wheeled) Transfers: Sit to/from [employer] Sit to Stand: +2 physical assistance,Mod assist,Min assist Stand pivot transfers: Min assist,+2 physical assistance General transfer comment: Mod Ax2 to come into standing from chair, min Ax2 from sit to stand from [facility], min Ax for steadying during stand pivot transfer, cueing for sequencing and navigation with RW    Ambulation / Gait / Stairs / Wheelchair Mobility  Ambulation/Gait Ambulation/Gait assistance: Min assist,+2 safety/equipment Gait Distance (Feet): 4 Feet Assistive device: Rolling walker (2 wheeled) Gait Pattern/deviations: Trunk flexed,Narrow base of support,Step-through pattern,Decreased stride  length General Gait Details: Cueing for navigation with RW, distance limited due to hypotension and bradycardia    Posture / Balance Balance Overall balance assessment: Needs assistance Sitting-balance support: Feet supported Sitting balance-Leahy Scale: Fair Standing balance-Leahy Scale: Poor Standing balance comment: Reliant on B UE support and external assistance    Special needs/care consideration Skin *** and Diabetic management ***   Previous Home Environment (from acute therapy documentation) Living Arrangements: Alone Available Help at Discharge: Family,Available PRN/intermittently Type of Home: House Home Layout: One level Home Access: Stairs to enter Entrance Stairs-Rails: Can reach both Entrance Stairs-Number of Steps: 4 Bathroom Shower/Tub: Optometrist: Yes [place]: No  Discharge Living Setting Plans for Discharge Living Setting: House Type of Home at Discharge: House Discharge Home Layout: One level Discharge Home Access: Stairs to enter Entrance Stairs-Rails: Can reach both Entrance Stairs-Number of Steps: 4 Discharge Bathroom Shower/Tub: Walk-in shower Discharge Bathroom  Toilet: Standard Discharge Bathroom Accessibility: Yes How Accessible: Accessible via walker Does the patient have any problems obtaining your medications?: No  Social/Family/Support Systems Patient Roles: Other (Comment) Contact Information: [phone] Anticipated Caregiver: Verl Dicker) Anticipated Caregiver's Contact Information: [phone] Ability/Limitations of Caregiver: Can do min A Caregiver Availability: 24/7 Discharge Plan Discussed with Primary Caregiver: Yes Is Caregiver In Agreement with Plan?: Yes Does Caregiver/Family have Issues with Lodging/Transportation while Pt is in Rehab?: Yes  Goals Patient/Family Goal for Rehab: PT/OT/SLP Mod I Expected length of stay: 10-12 days Pt/Family Agrees to  Admission and willing to participate: Yes Program Orientation Provided & Reviewed with Pt/Caregiver Including Roles  & Responsibilities: Yes  Decrease burden of Care through IP rehab admission: Specialzed equipment needs, Diet advancement, Decrease number of caregivers, Bowel and bladder program and Patient/family education  Possible need for SNF placement upon discharge: not anticipated  Patient Condition: I have reviewed medical records from [facility], spoken with CM, and daughter. I discussed via phone for inpatient rehabilitation assessment.  Patient will benefit from ongoing PT, OT and SLP, can actively participate in 3 hours of therapy a day 5 days of the week, and can make measurable gains during the admission.  Patient will also benefit from the coordinated team approach during an Inpatient Acute Rehabilitation admission.  The patient will receive intensive therapy as well as Rehabilitation physician, nursing, social worker, and care management interventions.  Due to bladder management, bowel management, safety, skin/wound care, disease management, medication administration, pain management and patient education the patient requires 24 hour a day rehabilitation nursing.  The patient is currently *** with mobility and basic ADLs.  Discharge setting and therapy post discharge at [facility] IP discharge location:[id number]} is anticipated.  Patient has agreed to participate in the Acute Inpatient Rehabilitation Program and will admit {Time; today/tomorrow:10263}.  Preadmission Screen Completed By:  Genella Mech, 07/27/2020 7:55 AM ______________________________________________________________________   Discussed status with Dr. Marland Kitchen on *** at *** and received approval for admission today.  Admission Coordinator:  Genella Mech, CCC-SLP, time ***Sudie Grumbling ***   Assessment/Plan: Diagnosis: 1. Does the need for close, 24 hr/day Medical supervision in concert with the patient's rehab  needs make it unreasonable for this patient to be served in a less intensive setting? {yes_no_potentially:3041433} 2. Co-Morbidities requiring supervision/potential complications: *** 3. Due to {due HW:3888280}, does the patient require 24 hr/day rehab nursing? {yes_no_potentially:3041433} 4. Does the patient require coordinated care of a physician, rehab nurse, PT, OT, and SLP to address physical and functional deficits in the context of the above medical diagnosis(es)? {yes_no_potentially:3041433} Addressing deficits in the following areas: {deficits:3041436} 5. Can the patient actively participate in an intensive therapy program of at least 3 hrs of therapy 5 days a week? {yes_no_potentially:3041433} 6. The potential for patient to make measurable gains while on inpatient rehab is {potential:3041437} 7. Anticipated functional outcomes upon discharge from inpatient rehab: {functional outcomes:304600100} PT, {functional outcomes:304600100} OT, {functional outcomes:304600100} SLP 8. Estimated rehab length of stay to reach the above functional goals is: *** 9. Anticipated discharge destination: {anticipated dc setting:21604} 10. Overall Rehab/Functional Prognosis: {potential:3041437}   MD Signature: ***

## 2020-07-27 NOTE — Progress Notes (Addendum)
Progress Note  Patient Name: Kristin [name] Date of Encounter: 07/27/2020  [facility] Cardiologist: Dr. Terrence Dupont  Subjective   Feels a little better today  Inpatient Medications    Scheduled Meds:  acetaminophen (TYLENOL) oral liquid 160 mg/5 mL  1,000 mg Oral Q6H   Or   acetaminophen  1,000 mg Oral Q6H   aspirin  81 mg Oral Daily   atorvastatin  80 mg Oral q1800   bisacodyl  10 mg Oral Daily   Or   bisacodyl  10 mg Rectal Daily   Chlorhexidine Gluconate Cloth  6 each Topical Daily   clopidogrel  75 mg Oral Daily   docusate  200 mg Oral Daily   enoxaparin (LOVENOX) injection  30 mg Subcutaneous QHS   feeding supplement  237 mL Oral TID BM   furosemide  80 mg Intravenous BID   insulin aspart  0-24 Units Subcutaneous Q4H   insulin detemir  10 Units Subcutaneous Daily   mouth rinse  15 mL Mouth Rinse BID   megestrol  40 mg Oral Daily   melatonin  3 mg Oral QHS   midodrine  5 mg Oral TID WC   multivitamin with minerals  1 tablet Oral Daily   pantoprazole sodium  40 mg Oral Daily   pneumococcal 23 valent vaccine  0.5 mL Intramuscular Tomorrow-1000   polyethylene glycol  17 g Oral Daily   potassium chloride  40 mEq Oral BID   QUEtiapine  50 mg Oral QHS   sodium chloride flush  3 mL Intravenous Q12H   Continuous Infusions:  sodium chloride     lactated ringers     PRN Meds: dextrose, lactated ringers, magic mouthwash, metoprolol tartrate, morphine injection, ondansetron (ZOFRAN) IV, oxyCODONE, sodium chloride flush, traMADol   Vital Signs    Vitals:   07/27/20 0700 07/27/20 0745 07/27/20 0800 07/27/20 0900  BP: (!) 115/50  (!) 127/55 (!) 108/39  Pulse: 84  (!) 106 99  Resp: (!) '26  18 18  '$ Temp:  98.8 F (37.1 C) 98.8 F (37.1 C)   TempSrc:  Oral Oral   SpO2: 95%  92% 95%  Weight:      Height:        Intake/Output Summary (Last 24 hours) at 07/27/2020 0921 Last data filed at 07/27/2020 0750 Gross per 24 hour  Intake 243 ml  Output 2804 ml  Net -2561  ml   Last 3 Weights 07/27/2020 07/26/2020 07/25/2020  Weight (lbs) 186 lb 12.8 oz 189 lb 2.5 oz 198 lb 3.1 oz  Weight (kg) 84.732 kg 85.8 kg 89.9 kg      Telemetry    AV pacing >> underlying is an accel junctional rhythm 50's-60's - Personally Reviewed  ECG    Yesterday CHB, V pacing - Personally Reviewed  Physical Exam   GEN: No acute distress.   Neck: No JVD Cardiac: RRR, no murmurs, rubs, or gallops.  Respiratory: decreased at the bases. GI: Soft, nontender, non-distended  MS:  trace edema; No deformity. Neuro:  Nonfocal  Psych: Normal affect   Labs    High Sensitivity Troponin:   Recent Labs  Lab 07/20/20 1538 07/20/20 1729  TROPONINIHS 3,198* 3,282*      Chemistry Recent Labs  Lab 07/26/20 0357 07/26/20 1202 07/26/20 1401 07/27/20 0210  NA 145 142  --  144  K 3.2* 7.0* 4.9 3.8  CL 111 106  --  106  CO2 28 29  --  31  GLUCOSE 122*  272*  --  96  BUN 30* 34*  --  34*  CREATININE 1.44* 1.72*  --  1.68*  CALCIUM 7.3* 8.0*  --  8.2*  PROT 4.2*  --   --   --   ALBUMIN 2.5*  --   --   --   AST 321*  --   --   --   ALT 938*  --   --   --   ALKPHOS 94  --   --   --   BILITOT 1.3*  --   --   --   GFRNONAA 37* 30*  --  31*  ANIONGAP 6 7  --  7     Hematology Recent Labs  Lab 07/25/20 0257 07/26/20 0357 07/27/20 0210  WBC 8.3 7.5 9.0  RBC 2.79* 2.88* 3.09*  HGB 8.6* 9.1* 9.6*  HCT 26.9* 27.8* 30.5*  MCV 96.4 96.5 98.7  MCH 30.8 31.6 31.1  MCHC 32.0 32.7 31.5  RDW 16.7* 16.7* 17.1*  PLT 64* 79* 107*    BNPNo results for input(s): BNP, PROBNP in the last 168 hours.   DDimer No results for input(s): DDIMER in the last 168 hours.   Radiology    07/27/20 CXR IMPRESSION: Small bibasilar pleural effusions and atelectasis.  Cardiac Studies   07/22/20: TEE LEFT VENTRICLE: EF = 40-45%.The LV has marked LVH and is underfilled.There is external compression of the LV from a fluid collection posterior an lateral to the heart.  RIGHT VENTRICLE: Moderate  HK + swan LEFT ATRIUM: Dilated. Mild external compression  LEFT ATRIAL APPENDAGE: Not visualized RIGHT ATRIUM: Dialted AORTIC VALVE:  Trileaflet. No AI/AS MITRAL VALVE:    Normal. Trivial MR TRICUSPID VALVE: Normal. Trivial TR PULMONIC VALVE: Normal INTERATRIAL SEPTUM: Not well visualized PERICARDIUM: Large echodense collection posterior and lateral to heart.  DESCENDING AORTA: Not visualized     07/21/20: LHC Mid LM lesion is 95% stenosed. Ost Cx lesion is 99% stenosed. Dist LM to Ost LAD lesion is 95% stenosed. Mid LAD lesion is 95% stenosed. Prox Cx lesion is 90% stenosed. Prox RCA to Mid RCA lesion is 80% stenosed. RPDA lesion is 80% stenosed. RPAV lesion is 80% stenosed. Ost LM lesion is 30% stenosed.         07/20/20; TTE IMPRESSIONS   1. Left ventricular ejection fraction, by estimation, is 40 to 45%. The  left ventricle has mild to moderately decreased function. The left  ventricle demonstrates regional wall motion abnormalities (see scoring  diagram/findings for description). Left  ventricular diastolic parameters are consistent with Grade I diastolic  dysfunction (impaired relaxation).   2. Right ventricular systolic function is normal. The right ventricular  size is normal.   3. The mitral valve is normal in structure. Mild mitral valve  regurgitation.   4. The aortic valve is calcified. Aortic valve regurgitation is not  visualized. Mild aortic valve sclerosis is present, with no evidence of  aortic valve stenosis.   5. The inferior vena cava is normal in size with <50% respiratory  variability, suggesting right atrial pressure of 8 mmHg.     Patient Profile     80 y.o. female with a hx of DM, obesity, HTN admitted with NSTEMI > LHC noted critical LM and Multivessel CAD > IABP > emergent CABG 07/22/20 (x5 (LIMA to LAD, SEQ SVG to OM and Diagonal, SEQ to PL and PDA)  Admitted with NSTEMI > LHC noted critical LM disease > IABP > emergent CABG 07/21/20,  Post op  shock with suspect tamponade, TEE confirmed  Post op with tamponade and brought back to the OR 07/22/20   Extubated 07/22/20, epi 3, milrinone 0.125, and VP 0.02 3/26 very agitated, remained on NE 3, milrinone 0.125 and precedex 07/24/20 remained confused yesterday CO-OX 65%. Norepi 4 mcg + milrinone 0.125 mcg Na lower 145      Plts 47K => 50=>64K.  Creatinine 1.1 -> 1.4 -> 1.7 -> 1.9 -> 2.01->1.7  Today  CT out yesterday Off inotrope/pressors   Assessment & Plan    1. P/o CHB     Today is POD #6 CABG                    POD #5 re-exploration/tamponade evacuation     No nodal blockers on board     She is AV pacing via epicardial wires.  Dr. Lovena Le has seen and examined the patient She felt poorly with VVI back up pacing yesterday when her junctional rhythm slowed. DDD pacing was SR/VPacing This morning with interruption in her pacing she was again in CHB V rate 28-30bpm  She was seen by HF team and her pacing turned down finding the same She has now settled into AFlutter w/V pacing at 11  Dr. [name] felt pacing PPM would be needed, discussed with the patient and she is agreeable LVEF 40-45%, baseline LBBB Plan for CRT-P     2. Cardiogenic shock post-op (tamponade)   Heart failure team on board   Off pressors   On midodrine 3. CAD   Post CABG 4. AKI   Creat waxing/waning 5. P/o anemia, thrombocytopenia    H/H stable, plts up some more > 107 this AM  6. New Aflutter     Dyon Rotert need to start/consider a/c post pacing tomorrow    For questions or updates, please contact [place] Please consult [email or web address] for contact info under      Signed, Baldwin Jamaica, PA-C  07/27/2020, 9:21 AM    I have seen and examined this patient with Tommye Standard.  Agree with above, note added to reflect my findings.  On exam, RRR, no murmurs, lungs clear. Patient remains V paced. Vayda Dungee plan for pacemaker implant.  LUCILLE BRANDES has presented today for surgery, with the  diagnosis of complete AV block.  The various methods of treatment have been discussed with the patient and family. After consideration of risks, benefits and other options for treatment, the patient has consented to  Procedure(s): Pacemaker implant as a surgical intervention .  Risks include but not limited to bleeding, infection, pneumothorax, perforation, tamponade, vascular damage, renal failure, MI, stroke, death, and lead dislodgement . The patient's history has been reviewed, patient examined, no change in status, stable for surgery.  I have reviewed the patient's chart and labs.  Questions were answered to the patient's satisfaction.    Sequoya Hogsett [name], MD 07/27/2020 10:51 AM     Brettany Sydney M. Verona Hartshorn MD 07/27/2020 10:51 AM

## 2020-07-27 NOTE — Progress Notes (Signed)
Inpatient Rehab Admissions Coordinator:   Pt. Going for pacemaker placement today, not medically ready for CIR> I updated her daughter, Oswald Hillock. [facility] team will continue to follow for potential admit pending medical readiness, bed availability, and insurance authorization.  Clemens Catholic, [place], [place] Admissions Coordinator  [phone] ([place]) [phone] (office)

## 2020-07-28 ENCOUNTER — Inpatient Hospital Stay (HOSPITAL_COMMUNITY): Payer: Medicare HMO

## 2020-07-28 ENCOUNTER — Encounter (HOSPITAL_COMMUNITY): Payer: Self-pay | Admitting: Cardiology

## 2020-07-28 DIAGNOSIS — Z950 Presence of cardiac pacemaker: Secondary | ICD-10-CM | POA: Diagnosis not present

## 2020-07-28 DIAGNOSIS — I214 Non-ST elevation (NSTEMI) myocardial infarction: Secondary | ICD-10-CM | POA: Diagnosis not present

## 2020-07-28 DIAGNOSIS — I442 Atrioventricular block, complete: Secondary | ICD-10-CM | POA: Diagnosis not present

## 2020-07-28 DIAGNOSIS — Z951 Presence of aortocoronary bypass graft: Secondary | ICD-10-CM

## 2020-07-28 LAB — CBC
HCT: 30.1 % — ABNORMAL LOW (ref 36.0–46.0)
Hemoglobin: 9.4 g/dL — ABNORMAL LOW (ref 12.0–15.0)
MCH: 31 pg (ref 26.0–34.0)
MCHC: 31.2 g/dL (ref 30.0–36.0)
MCV: 99.3 fL (ref 80.0–100.0)
Platelets: 142 10*3/uL — ABNORMAL LOW (ref 150–400)
RBC: 3.03 MIL/uL — ABNORMAL LOW (ref 3.87–5.11)
RDW: 17.6 % — ABNORMAL HIGH (ref 11.5–15.5)
WBC: 7.7 10*3/uL (ref 4.0–10.5)
nRBC: 2.2 % — ABNORMAL HIGH (ref 0.0–0.2)

## 2020-07-28 LAB — BASIC METABOLIC PANEL
Anion gap: 6 (ref 5–15)
BUN: 33 mg/dL — ABNORMAL HIGH (ref 8–23)
CO2: 31 mmol/L (ref 22–32)
Calcium: 8.1 mg/dL — ABNORMAL LOW (ref 8.9–10.3)
Chloride: 105 mmol/L (ref 98–111)
Creatinine, Ser: 1.72 mg/dL — ABNORMAL HIGH (ref 0.44–1.00)
GFR, Estimated: 30 mL/min — ABNORMAL LOW (ref >60)
Glucose, Bld: 187 mg/dL — ABNORMAL HIGH (ref 70–99)
Potassium: 3.9 mmol/L (ref 3.5–5.1)
Sodium: 142 mmol/L (ref 135–145)

## 2020-07-28 LAB — GLUCOSE, CAPILLARY
Glucose-Capillary: 146 mg/dL — ABNORMAL HIGH (ref 70–99)
Glucose-Capillary: 156 mg/dL — ABNORMAL HIGH (ref 70–99)
Glucose-Capillary: 181 mg/dL — ABNORMAL HIGH (ref 70–99)
Glucose-Capillary: 195 mg/dL — ABNORMAL HIGH (ref 70–99)
Glucose-Capillary: 209 mg/dL — ABNORMAL HIGH (ref 70–99)
Glucose-Capillary: 227 mg/dL — ABNORMAL HIGH (ref 70–99)
Glucose-Capillary: 311 mg/dL — ABNORMAL HIGH (ref 70–99)

## 2020-07-28 LAB — MAGNESIUM: Magnesium: 2 mg/dL (ref 1.7–2.4)

## 2020-07-28 MED ORDER — CLOPIDOGREL BISULFATE 75 MG PO TABS
75.0000 mg | ORAL_TABLET | Freq: Every day | ORAL | Status: DC
  Administered 2020-07-28 – 2020-08-06 (×10): 75 mg via ORAL
  Filled 2020-07-28 (×10): qty 1

## 2020-07-28 MED ORDER — GUAIFENESIN-DM 100-10 MG/5ML PO SYRP
5.0000 mL | ORAL_SOLUTION | ORAL | Status: DC | PRN
  Administered 2020-07-28 – 2020-08-05 (×5): 5 mL via ORAL
  Filled 2020-07-28 (×5): qty 5

## 2020-07-28 MED ORDER — MAGIC MOUTHWASH
15.0000 mL | Freq: Four times a day (QID) | ORAL | Status: DC | PRN
  Administered 2020-07-28 – 2020-07-31 (×6): 15 mL via ORAL
  Filled 2020-07-28 (×7): qty 15

## 2020-07-28 MED ORDER — POTASSIUM CHLORIDE CRYS ER 20 MEQ PO TBCR
40.0000 meq | EXTENDED_RELEASE_TABLET | Freq: Two times a day (BID) | ORAL | Status: DC
  Administered 2020-07-28: 40 meq via ORAL
  Filled 2020-07-28: qty 2

## 2020-07-28 MED ORDER — INSULIN ASPART 100 UNIT/ML ~~LOC~~ SOLN
5.0000 [IU] | Freq: Three times a day (TID) | SUBCUTANEOUS | Status: DC
  Administered 2020-07-28 – 2020-08-10 (×34): 5 [IU] via SUBCUTANEOUS

## 2020-07-28 MED FILL — Lidocaine HCl Local Inj 1%: INTRAMUSCULAR | Qty: 50 | Status: AC

## 2020-07-28 NOTE — Discharge Summary (Signed)
[id number]       [place] [address]             [phone]    Physician Discharge Summary  Patient ID: Kristin [name] MRN: [id number] DOB/AGE: 12-19-1940 80 y.o.  Admit date: 07/20/2020 Discharge date: 08/10/2020  Admission Diagnoses:  Patient Active Problem List   Diagnosis Date Noted  . MI, acute, non ST segment elevation ([place]) 07/20/2020    Discharge Diagnoses:  Patient Active Problem List   Diagnosis Date Noted  . S/P placement of cardiac pacemaker 07/28/2020  . S/P CABG x 5 07/28/2020  . MI, acute, non ST segment elevation ([place]) 07/20/2020  History of Present Illness:  Kristin [name] is a 80 yo female with history of DM, Hyperlipidemia, and HTN.  The patient has been in her usual state of health until recently.  Over the past several months the patient has developed more fatigue and progressively worse dyspnea on exertion.  The patient had an episode of chest pain 2 nights ago that was retrosternal with radiation into the left arm.  It awoke her from sleep but she didn't seek medical attention.  She awoke the morning and the chest pain occurred again with radiation to her left arm with associated shortness of breath.  She contacted her primary medical doctors office and was advised to go to the ED.  Workup in the ED showed NSR with a left BBB.  Her troponin was mildly elevated.  She was started on Heparin with relief of her chest pain.  She was transferred to [facility] for additional workup.    Hospital Course:  The patient  was chest pain free on arrival to [facility].  It was felt she would require cardiac catheterization and she was agreeable to proceed.  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  She required placement of an IABP.  It was felt emergent coronary bypass grafting would be indicated and TCTS consult was obtained.  The patient was evaluated by [name] who was in agreement  the patient would require emergent coronary bypass grafting.  The risks and benefits of the procedure were explained to the patient and she was agreeable to proceed.  She was taken to the operating room and underwent CABG x 5 utilizing LIMA to LAD, Sequential SVG to OM and Diagonal, and SVG to PDA and PLVB.  She also underwent endoscopic harvest of greater saphenous vein from her right and left thigh.  She tolerated the procedure without difficulty and was taken to the SICU in stable condition.  The patient remained hypotensive post operatively despite pressor support.  Emergent AHF consult was requested and bedside Echocardiogram was performed.  This showed evidence of a pericardial effusion with tamponade.  She was taken back to the operating room and underwent mediastinal exploration.  She tolerated the procedure and was taken back to the SICU in critically ill condition.  Her hemodynamics improved post exploration.  She remains on Epinephrine and Vasopressin for support.  She was started on a Lasix drip due to volume overload state.  Her creatinine level increased due to shock/ATN and peaked at 2.01.  She required Milrinone drip to be initiated with low cardiac output, elevated SVR.  She was also treated with Albumin.  She was weaned and extubated on 07/23/2020.  She underwent evaluation by SLP.  They felt she was a moderate aspiration risk.  They recommended  the patient remain NPO accept for medications.  The patient was confused and pulled her [name] out.  This persisted and the patient required Precedex to aid in management.  Her volume status improved and she was transitioned to an IV regimen of Lasix.  Milrinone was stopped on 07/25/2020.  She required AV pacing.  Unfortunately her underlying heart rhythm was complete heart block.  EP consult was obtained.  Unfortunately the patients CHB persisted.  It was felt the patient would require placement of PPM.  This was performed by Dr. [name] on  07/27/2020.  She has CRT-P  pacer. She continued to work with SLP and was progressed to a dysphagia 2 diet.  The patient was able to be weaned off all remaining pressor support.  She was orthostatic and Midodrine was initiated.  She was stable for transfer to the progressive care unit on 07/27/2020.  The patient continues to progress.  Her weight is trending down with the use of Lasix. She continues to work with SLP and was advanced to a dysphagia 3 diet.  She is felt to be stable from ischemic cardiomyopathy perspective.  She is off all pressors and midodrine has been stopped because blood pressures remained stable.  She continues to be on spironolactone .   08/09/2018 to torsemide was restarted at 40 mg daily.  Beta-blocker/ACE inhibitor/ARB were initially  not felt to be acceptable for use due to her soft blood pressures but this has stabilized to the acceptable point of adding low dose losartan and metoprolol.  She is now on apixaban for recurrent atrial fibrillation episodes and Plavix has been stopped.  She continues statin.  She continues aspirin.  She has undergone thoracentesis on the left side on 2 occasions for effusions.  Her renal function is felt to be showing steady improvement over time with most recent creatinine 1.6.  This is all felt to be secondary to initial shock/ATN.  Hemoglobin hematocrit are continuing to trend stable.  Most recent hemoglobin on 08/08/2020 are 8.8.  Thrombocytopenia has resolved.  She continues to have significant deconditioning and is working aggressively with PT/OT.  She was denied CIR and plans are in place to transfer to skilled nursing facility for ongoing rehabilitation.    Discharged Condition: good   Consults:EP,  cardiology and rehabilitation medicine  Significant Diagnostic Studies: angiography:    Mid LM lesion is 95% stenosed.  Ost Cx lesion is 99% stenosed.  Dist LM to Ost LAD lesion is 95% stenosed.  Mid LAD lesion is 95% stenosed.  Prox Cx  lesion is 90% stenosed.  Prox RCA to Mid RCA lesion is 80% stenosed.  RPDA lesion is 80% stenosed.  RPAV lesion is 80% stenosed.  Ost LM lesion is 30% stenosed.  Treatments: surgery:    Coronary Artery Bypass Grafting x 5             Left Internal Mammary Artery to Distal Left Anterior Descending Coronary Artery; Saphenous Vein Graft to  Posterior Descending Coronary Artery and right PLA as sequenced graft; Saphenous Vein Graft to  Obtuse Marginal Branch of Left Circumflex Coronary Artery and 1st Diagonal Branch Coronary Artery as sequenced graft; Endoscopic Vein Harvest from bilateral Thighs Completion graft surveillance with indocyanine green fluorescence imaging (SPY)  Surgeon:        B. Murvin Natal, MD  Assistant:       Leretha Pol, PA-C   Procedure(s) and Anesthesia Type:    * MEDIASTINAL EXPLORATION - General    *  TRANSESOPHAGEAL ECHOCARDIOGRAM (TEE) - General  Surgeon(s) and Role:    * [name], MD - Primary   PREPROCEDURE DIAGNOSIS:  Complete AV block    POSTPROCEDURE DIAGNOSIS:  Complete AV block     PROCEDURES:   1. Pacemaker implantation.    Will [name], MD 07/27/2020 5:33 PM   Major events of hospitalization: 3/24 NSTEMI - 3/24 Emergent CABG x 5 (LIMA to LAD, SEQ SVG to OM and Diagonal, SEQ to PL and PDA) - 3/25 take back to OR for tamponade. EF on TEE 55%, RV mildly down (3/25) -3/26 Extubated -3/28 Milrinone stopped. Midodrine started.  -3/30 S/P CRT-P.  -4/5 left thoracentesis, started on cefepime -4/8 had several episodes of Afib; started on apixaban -4/11 repeat L thora with 500 out   Discharge Exam: Blood pressure 107/61, pulse 95, temperature 98.5 F (36.9 C), temperature source Oral, resp. rate 16, height '5\' 6"'$  (1.676 m), weight 75.6 kg, SpO2 98 %.  General appearance: alert, cooperative and no distress Heart: regular rate and rhythm Lungs: mildly dim in bases Abdomen: benign Extremities: trace edema Wound: incis healing  well  Discharge Medications:  The patient has been discharged on:   1.Beta Blocker:  Yes [ y  ]                              No   [   ]                              If No, reason:   2.Ace Inhibitor/ARB: Yes [  x ]                                     No  [   ]                                     If No, reason: 3.Statin:   Yes [ X  ]                  No  [   ]                  If No, reason:  4.Ecasa:  Yes  [ X  ]                  No   [   ]                  If No, reason:    Discharge Instructions    Amb Referral to Cardiac Rehabilitation   Complete by: As directed    Will send Cardiac Rehab Phase 2 referral to [place]   Diagnosis: CABG   CABG X ___: 5   After initial evaluation and assessments completed: Virtual Based Care may be provided alone or in conjunction with Phase 2 Cardiac Rehab based on patient barriers.: Yes   Discharge patient   Complete by: As directed    Discharge disposition: 03-Skilled [place]   Discharge patient date: 08/10/2020     Allergies as of 08/10/2020      Reactions   Sulfa Antibiotics Hives      Medication List    STOP taking these medications  amLODipine 5 MG tablet Commonly known as: NORVASC   rosuvastatin 40 MG tablet Commonly known as: CRESTOR   trimethoprim 100 MG tablet Commonly known as: TRIMPEX     TAKE these medications   acetaminophen 325 MG tablet Commonly known as: TYLENOL Take 2 tablets (650 mg total) by mouth every 6 (six) hours as needed for mild pain or fever.   albuterol 108 (90 Base) MCG/ACT inhaler Commonly known as: VENTOLIN HFA Inhale 2 puffs into the lungs every 6 (six) hours as needed for wheezing or shortness of breath.   Alcohol Prep 70 % Pads   apixaban 5 MG Tabs tablet Commonly known as: ELIQUIS Take 1 tablet (5 mg total) by mouth 2 (two) times daily.   aspirin 81 MG chewable tablet Chew 1 tablet (81 mg total) by mouth daily.   Assure Comfort Lancets 30G Misc   atorvastatin 80 MG  tablet Commonly known as: LIPITOR Take 1 tablet (80 mg total) by mouth daily at 6 PM.   b complex vitamins tablet Take 1 tablet by mouth daily.   buPROPion 150 MG 24 hr tablet Commonly known as: WELLBUTRIN XL   cholecalciferol 1000 units tablet Commonly known as: VITAMIN D Take 1,000 Units by mouth daily.   Comfort EZ Pen Needles 31G X 8 MM Misc Generic drug: Insulin Pen Needle   Embrace Blood Glucose Monitor Devi   Embrace Blood Glucose Test test strip Generic drug: glucose blood   feeding supplement Liqd Take 237 mLs by mouth 3 (three) times daily between meals.   glimepiride 1 MG tablet Commonly known as: AMARYL Take 1 mg by mouth every morning.   Levemir FlexTouch 100 UNIT/ML FlexPen Generic drug: insulin detemir Inject 42 Units into the skin daily. What changed: Another medication with the same name was added. Make sure you understand how and when to take each.   insulin detemir 100 UNIT/ML injection Commonly known as: LEVEMIR Inject 0.24 mLs (24 Units total) into the skin daily. What changed: You were already taking a medication with the same name, and this prescription was added. Make sure you understand how and when to take each.   losartan 25 MG tablet Commonly known as: COZAAR Take 0.5 tablets (12.5 mg total) by mouth at bedtime.   magic mouthwash Soln Take 15 mLs by mouth 4 (four) times daily as needed for mouth pain.   melatonin 3 MG Tabs tablet Take 1 tablet (3 mg total) by mouth at bedtime.   metoprolol succinate 25 MG 24 hr tablet Commonly known as: TOPROL-XL Take 1 tablet (25 mg total) by mouth daily.   pioglitazone 30 MG tablet Commonly known as: ACTOS Take 30 mg by mouth daily.   polyethylene glycol 17 g packet Commonly known as: MIRALAX / GLYCOLAX Take 17 g by mouth daily.   QUEtiapine 50 MG tablet Commonly known as: SEROQUEL Take 1 tablet (50 mg total) by mouth at bedtime.   spironolactone 25 MG tablet Commonly known as:  ALDACTONE Take 0.5 tablets (12.5 mg total) by mouth daily.   topiramate 50 MG tablet Commonly known as: TOPAMAX Take 50 mg by mouth daily.   torsemide 20 MG tablet Commonly known as: DEMADEX Take 1 tablet (20 mg total) by mouth daily.   traMADol 50 MG tablet Commonly known as: ULTRAM Take 1 tablet (50 mg total) by mouth every 6 (six) hours as needed for moderate pain.   ZINC PO Take 1 tablet by mouth daily.  Durable Medical Equipment  (From admission, onward)         Start     Ordered   07/29/20 0731  For home use only DME Walker rolling  Once       Question Answer Comment  Walker: With 5 Inch Wheels   Patient needs a walker to treat with the following condition S/P CABG (coronary artery bypass graft)      07/29/20 0730   07/29/20 0731  For home use only DME 3 n 1  Once        07/29/20 0730          Contact information for follow-up providers    [name], MD Follow up on 08/18/2020.   Specialty: Cardiothoracic Surgery Why: Appointment is at 10:30 Contact information: [place] [address] [phone]        Charolette Forward, MD Follow up.   Specialty: Cardiology Why: Please contact office after you are discharged from SNF/rehab to set up follow up appointment. Contact information: [address] [address] [phone]        [place] [facility] Follow up.   Specialty: Cardiology Why: 08/11/20 @ 3:20PM< wound check visit (pacemaker) Contact information: [address], [address] [place]       Constance Haw, MD Follow up.   Specialty: Cardiology Why: 11/15/20 @ 2:15PM Contact information: [address] [place] [address] [phone]            Contact information for after-discharge care    Destination    [facility] Preferred SNF .   Service: Skilled Nursing Contact  information: [place] [phone]                  Signed: John Giovanni, PA-C 08/10/2020, 9:20 AM

## 2020-07-28 NOTE — Progress Notes (Addendum)
Patient ID: Kristin [name], female   DOB: Sep 26, 1940, 80 y.o.   MRN: [id number]    Cardiologist: Terrence Dupont  Advanced Heart Failure Rounding Note   Subjective:    - 3/24 NSTEMI - 3/24 Emergent CABG x 5 (LIMA to LAD, SEQ SVG to OM and Diagonal, SEQ to PL and PDA) - 3/25 take back to OR for tamponade. EF on TEE 55%, RV mildly down (3/25) -3/26 Extubated -3/28 Milrinone stopped. Midodrine started.  -3/30 S/P CRT-P. Received 80 mg IV lasix x2. I/O not accurate.   Complaining of mouth pain. Denies SOB.      Objective:   Weight Range:  Vital Signs:   Temp:  [97.8 F (36.6 C)-99.2 F (37.3 C)] 97.8 F (36.6 C) (03/31 0318) Pulse Rate:  [70-113] 88 (03/31 0318) Resp:  [12-25] 20 (03/31 0318) BP: (100-146)/(39-95) 112/50 (03/31 0318) SpO2:  [88 %-100 %] 95 % (03/31 0318) Last BM Date: 07/27/20  Weight change: Filed Weights   07/25/20 0500 07/26/20 0500 07/27/20 0500  Weight: 89.9 kg 85.8 kg 84.7 kg    Intake/Output:   Intake/Output Summary (Last 24 hours) at 07/28/2020 0811 Last data filed at 07/28/2020 0634 Gross per 24 hour  Intake 368.09 ml  Output 1450 ml  Net -1081.91 ml      Physical Exam: General: Sitting in the chair. No resp difficulty HEENT: normal Neck: supple. JVP difficult to assess.  Carotids 2+ bilat; no bruits. No lymphadenopathy or thryomegaly appreciated. Cor: PMI nondisplaced. Regular rate & rhythm. No rubs, gallops or murmurs. Sternal incision approximated. L upper chest dressing.  Lungs: clear Abdomen: soft, nontender, nondistended. No hepatosplenomegaly. No bruits or masses. Good bowel sounds. Extremities: no cyanosis, clubbing, rash, edema. RUE PICC  Neuro: alert & orientedx3, cranial nerves grossly intact. moves all 4 extremities w/o difficulty. Affect pleasant   Telemetry: BiV pacing. Reviewed with EP. Underlying rhythm CHM  Labs: Basic Metabolic Panel: Recent Labs  Lab 07/22/20 1700 07/23/20 0300 07/25/20 1032 07/25/20 1555  07/26/20 0357 07/26/20 1202 07/26/20 1401 07/27/20 0210 07/28/20 0059  NA 150*   < >  --  145 145 142  --  144 142  K 3.6   < >  --  3.4* 3.2* 7.0* 4.9 3.8 3.9  CL 114*   < >  --  108 111 106  --  106 105  CO2 27   < >  --  '31 28 29  '$ --  31 31  GLUCOSE 148*   < >  --  119* 122* 272*  --  96 187*  BUN 24*   < >  --  33* 30* 34*  --  34* 33*  CREATININE 1.91*   < >  --  1.65* 1.44* 1.72*  --  1.68* 1.72*  CALCIUM 8.3*   < >  --  8.2* 7.3* 8.0*  --  8.2* 8.1*  MG 2.4  --  1.8  --  1.8  --   --  2.3 2.0   < > = values in this interval not displayed.    Liver Function Tests: Recent Labs  Lab 07/26/20 0357  AST 321*  ALT 938*  ALKPHOS 94  BILITOT 1.3*  PROT 4.2*  ALBUMIN 2.5*   No results for input(s): LIPASE, AMYLASE in the last 168 hours. No results for input(s): AMMONIA in the last 168 hours.  CBC: Recent Labs  Lab 07/24/20 0315 07/25/20 0257 07/26/20 0357 07/27/20 0210 07/28/20 0059  WBC 10.5 8.3 7.5 9.0 7.7  HGB 8.6* 8.6* 9.1* 9.6* 9.4*  HCT 26.5* 26.9* 27.8* 30.5* 30.1*  MCV 95.3 96.4 96.5 98.7 99.3  PLT 50* 64* 79* 107* 142*    Cardiac Enzymes: No results for input(s): CKTOTAL, CKMB, CKMBINDEX, TROPONINI in the last 168 hours.  BNP: BNP (last 3 results) No results for input(s): BNP in the last 8760 hours.  ProBNP (last 3 results) No results for input(s): PROBNP in the last 8760 hours.    Other results:  Imaging: DG Chest 1 View  Result Date: 07/27/2020 CLINICAL DATA:  Shortness of breath, pre pacemaker placement EXAM: CHEST  1 VIEW COMPARISON:  Portable exam 0810 hours compared to 07/26/2020 FINDINGS: Borderline enlargement of cardiac silhouette post median sternotomy. Mediastinal contours and pulmonary vascularity normal. Bibasilar atelectasis and small pleural effusions. No segmental consolidation or pneumothorax. Osseous demineralization. IMPRESSION: Small bibasilar pleural effusions and atelectasis. Electronically Signed   By: Lavonia Dana M.D.    On: 07/27/2020 08:31   DG Chest 2 View  Result Date: 07/28/2020 CLINICAL DATA:  Cardiac device in situ. EXAM: CHEST - 2 VIEW COMPARISON:  07/27/2020. FINDINGS: AICD noted with lead tips in good anatomic position. Prior CABG. Cardiomegaly. Bilateral mild interstitial prominence suggesting interstitial edema. Small bilateral pleural effusions. Findings consistent with CHF. No pneumothorax. IMPRESSION: 1. AICD noted with lead tips in good anatomic position. No pneumothorax. 2. Prior CABG. Bilateral interstitial prominence and small bilateral pleural effusions. Findings consistent with CHF. Electronically Signed   By: Marcello Moores  Register   On: 07/28/2020 06:30   EP PPM/ICD IMPLANT  Result Date: 07/27/2020 SURGEON:  Allegra Lai, MD   PREPROCEDURE DIAGNOSIS:  Complete AV block   POSTPROCEDURE DIAGNOSIS:  Complete AV block    PROCEDURES:  1. Pacemaker implantation.   INTRODUCTION:  Kristin [name] is a 80 y.o. female with a history of bradycardia who presents today for pacemaker implantation.  The patient reports intermittent episodes of dizziness over the past few months.  No reversible causes have been identified.  The patient therefore presents today for pacemaker implantation.   DESCRIPTION OF PROCEDURE:  Informed written consent was obtained, and  the patient was brought to the electrophysiology lab in a fasting state.  The patient required no sedation for the procedure today.  The patients left chest was prepped and draped in the usual sterile fashion by the EP lab staff. The skin overlying the left deltopectoral region was infiltrated with lidocaine for local analgesia.  A 4-cm incision was made over the left deltopectoral region.  A left subcutaneous pacemaker pocket was fashioned using a combination of sharp and blunt dissection. Electrocautery was required to assure hemostasis.  Left Upper Extremity Venography: A venogram of the left upper extremity was performed, which revealed a large left cephalic vein,  which emptied into a large left subclavian vein.  The left axillary vein was moderate in size.  RA/RV Lead Placement: The left axillary vein was therefore cannulated.  Through the left axillary vein, a [employer] model Tendril MRI [id number] (serial number  [id number]) right atrial lead and a [employer] model Tendril MRI [id number] (serial number  [id number]) right ventricular lead were advanced with fluoroscopic visualization into the right atrial appendage and right ventricular apex positions respectively.  Initial atrial lead P- waves measured 1.5 mV with impedance of 463 ohms and a threshold of 0.5 V at 0.5 msec.  Right ventricular lead R-waves measured 14.2 mV with an impedance of 649 ohms and a threshold of 0.7 V at 0.5 msec.  Both leads were secured to the pectoralis fascia using #2-0 silk over the suture sleeves. LV Lead Placement: A St. Jude 135 guide was advanced through the left axillary vein into the low lateral right atrium. A Versicore wire was introduced through the guide and used to cannulate the coronary sinus. Coronary sinus cannulation was confirmed with electrogram recording from the hexapolar catheter. A selective coronary sinus venogram was performed by hand injection of nonionic contrast. This demonstrated a large CS body with very small/ atretic distal branches. There was a moderate sized lateral coronary sinus branch was noted along the mid portion of the CS body. No other posterior branches were identified. A Whisper CSJ wire was introduced through the guide and advanced into the distal posterolateral branch. [place] [phone] (serial number [id number] ) lead was advanced through the MB-2 into the lateral branch. This was approximately one-thirds from the base to the apex in a very lateral position. In this location, the left ventricular lead R-waves measured 3.4 mV with impedance of 615 ohms and a threshold of 1.2 volt at 0.5 milliseconds in the bipolar LV1-LV2 configuration with  no diaphragmatic stimulation observed when pacing at 10 volts output. The MB-2 guide was therefore removed. All three leads were secured to the pectoralis fascia using #2 silk suture over the suture sleeves. The pocket then irrigated with copious gentamicin solution. Device Placement:  The leads were then connected to a [facility] MP  model [id number] (serial number  [id number] ) pacemaker.  The pocket was irrigated with copious gentamicin solution.  The pacemaker was then placed into the pocket.  The pocket was then closed in 3 layers with 2.0 Vicryl suture for the 3.0 Vicryl suture subcutaneous and subcuticular layers.  Steri-  Strips and a sterile dressing were then applied. EBL<13m.  There were no early apparent complications.   CONCLUSIONS:  1. Successful implantation of a SGosperMP CRT-pacemaker for symptomatic bradycardia  2. No early apparent complications.       Will CCurt Bears MD 07/27/2020 5:33 PM     Medications:     Scheduled Medications: . acetaminophen (TYLENOL) oral liquid 160 mg/5 mL  1,000 mg Oral Q6H   Or  . acetaminophen  1,000 mg Oral Q6H  . aspirin  81 mg Oral Daily  . atorvastatin  80 mg Oral q1800  . bisacodyl  10 mg Oral Daily   Or  . bisacodyl  10 mg Rectal Daily  . Chlorhexidine Gluconate Cloth  6 each Topical Daily  . docusate  200 mg Oral Daily   Or  . docusate sodium  200 mg Oral Daily  . enoxaparin (LOVENOX) injection  30 mg Subcutaneous QHS  . feeding supplement  237 mL Oral TID BM  . furosemide  80 mg Intravenous BID  . insulin aspart  0-24 Units Subcutaneous Q4H  . insulin detemir  10 Units Subcutaneous Daily  . mouth rinse  15 mL Mouth Rinse BID  . megestrol  40 mg Oral Daily  . melatonin  3 mg Oral QHS  . midodrine  5 mg Oral TID WC  . multivitamin with minerals  1 tablet Oral Daily  . pantoprazole sodium  40 mg Oral Daily   Or  . pantoprazole  40 mg Oral Daily  . pneumococcal 23 valent vaccine  0.5 mL Intramuscular  Tomorrow-1000  . polyethylene glycol  17 g Oral Daily  . QUEtiapine  50 mg Oral QHS  . sodium  chloride flush  3 mL Intravenous Q12H    Infusions: .  ceFAZolin (ANCEF) IV Stopped (07/28/20 0427)  . lactated ringers      PRN Medications: acetaminophen, dextrose, guaiFENesin-dextromethorphan, lactated ringers, magic mouthwash, metoprolol tartrate, ondansetron (ZOFRAN) IV, ondansetron (ZOFRAN) IV, oxyCODONE, traMADol   Assessment/Plan:   1. Cardiogenic shock - s/p CABG 3/24 with take back for tamponade on 3/25 - LVEF 55% on post-op TEE 3/25 - Off pressors. - CXR with pulmonary edema. Continue IV lasix today.  - Continue midodrine 5 mg three times a day.    2. CAD s/p NSTEMI  - CABG x 5 3/24 - no evidence of ongoing ischemia - continue DAPT, statin. No bb with shock.  - Will SGT2i soon. GFR 30.   3. Acute hypoxic respiratory failure post-op - Stable on room air.   4. AKI - baseline creatinine 1.1 -> peaked at 2, today 1.4-> 1.7->1.7  - due to shock/ATN  5. Acute blood loss anemia, post -op - hgb stable at 9.4  6. Hypernatremia -Sodium 1454  7. Low platelets - Plts trending up 50> 64>79K.-> 107->142k  8. Dysphagia Speech following  Crush meds with small sips.   9. Deconditioning Consult PT/OT  10 CHB - EP consulted.  -S/P CRT-P-- CXR with pulmonary edema. Continue to diurese today.  - Spoke with EP at bedside.  Length of Stay: [place]  NP-C  07/28/2020, 8:11 AM  Advanced Heart Failure Team Pager [phone] (M-F; 7a - 4p)  Please contact [facility] for night-coverage after hours (4p -7a ) and weekends on [email or web address]  Patient seen and examined with the above-signed Advanced Practice Provider and/or Housestaff. I personally reviewed laboratory data, imaging studies and relevant notes. I independently examined the patient and formulated the important aspects of the plan. I have edited the note to reflect any of my changes or salient points. I have  personally discussed the plan with the patient and/or family.  S/p CRT-P yesterday. This am says she feels terrible. C/o pain around pacer site. Denies SOB. Remains on IV lasix. Weight nearing baseline   General:  Sitting in chair  No resp difficulty HEENT: normal Neck: supple. no JVD. Carotids 2+ bilat; no bruits. No lymphadenopathy or thryomegaly appreciated. Cor: PMI nondisplaced. Regular rate & rhythm. No rubs, gallops or murmurs. Pacer site tender to touch but no significant hematoma. Mild swelling and ecchymosis at superior end of sternal incision  Lungs: clear Abdomen: soft, nontender, nondistended. No hepatosplenomegaly. No bruits or masses. Good bowel sounds. Extremities: no cyanosis, clubbing, rash, edema Neuro: alert & orientedx3, cranial nerves grossly intact. moves all 4 extremities w/o difficulty. Affect distressed  Tele: LV pacing 90.   She is not feeling well today. Having pain at pacer site. No angina. Volume status improved. Agree with 1 dose of IV lasix today and then can hold. Will increase tramadol for pain. Watch wounds. Ambulate.    [name], MD  12:50 PM

## 2020-07-28 NOTE — Progress Notes (Signed)
Subjective:  Denies any chest pain or shortness of breath with ambulating in room with assistance.  Objective:  Vital Signs in the last 24 hours: Temp:  [97.8 F (36.6 C)-99.2 F (37.3 C)] 97.8 F (36.6 C) (03/31 0318) Pulse Rate:  [64-113] 64 (03/31 1112) Resp:  [12-21] 20 (03/31 0838) BP: (104-146)/(49-95) 119/53 (03/31 1232) SpO2:  [88 %-100 %] 91 % (03/31 1232) Weight:  [79.4 kg] 79.4 kg (03/31 0839)  Intake/Output from previous day: 03/30 0701 - 03/31 0700 In: 608.1 [P.O.:490; I.V.:18.1; IV Piggyback:100] Out: 1950 [Urine:1800; Emesis/NG output:150] Intake/Output from this shift: No intake/output data recorded.  Physical Exam: Neck: no adenopathy, no carotid bruit, no JVD and supple, symmetrical, trachea midline Lungs: clear to auscultation bilaterally Heart: regular rate and rhythm and S1, S2 normal Abdomen: soft, non-tender; bowel sounds normal; no masses,  no organomegaly Extremities: extremities normal, atraumatic, no cyanosis or edema  Lab Results: Recent Labs    07/27/20 0210 07/28/20 0059  WBC 9.0 7.7  HGB 9.6* 9.4*  PLT 107* 142*   Recent Labs    07/27/20 0210 07/28/20 0059  NA 144 142  K 3.8 3.9  CL 106 105  CO2 31 31  GLUCOSE 96 187*  BUN 34* 33*  CREATININE 1.68* 1.72*   No results for input(s): TROPONINI in the last 72 hours.  Invalid input(s): CK, MB Hepatic Function Panel Recent Labs    07/26/20 0357  PROT 4.2*  ALBUMIN 2.5*  AST 321*  ALT 938*  ALKPHOS 94  BILITOT 1.3*   No results for input(s): CHOL in the last 72 hours. No results for input(s): PROTIME in the last 72 hours.  Imaging: Imaging results have been reviewed and DG Chest 1 View  Result Date: 07/27/2020 CLINICAL DATA:  Shortness of breath, pre pacemaker placement EXAM: CHEST  1 VIEW COMPARISON:  Portable exam 0810 hours compared to 07/26/2020 FINDINGS: Borderline enlargement of cardiac silhouette post median sternotomy. Mediastinal contours and pulmonary vascularity  normal. Bibasilar atelectasis and small pleural effusions. No segmental consolidation or pneumothorax. Osseous demineralization. IMPRESSION: Small bibasilar pleural effusions and atelectasis. Electronically Signed   By: Lavonia Dana M.D.   On: 07/27/2020 08:31   DG Chest 2 View  Result Date: 07/28/2020 CLINICAL DATA:  Cardiac device in situ. EXAM: CHEST - 2 VIEW COMPARISON:  07/27/2020. FINDINGS: AICD noted with lead tips in good anatomic position. Prior CABG. Cardiomegaly. Bilateral mild interstitial prominence suggesting interstitial edema. Small bilateral pleural effusions. Findings consistent with CHF. No pneumothorax. IMPRESSION: 1. AICD noted with lead tips in good anatomic position. No pneumothorax. 2. Prior CABG. Bilateral interstitial prominence and small bilateral pleural effusions. Findings consistent with CHF. Electronically Signed   By: Marcello Moores  Register   On: 07/28/2020 06:30   EP PPM/ICD IMPLANT  Result Date: 07/27/2020 SURGEON:  Allegra Lai, MD   PREPROCEDURE DIAGNOSIS:  Complete AV block   POSTPROCEDURE DIAGNOSIS:  Complete AV block    PROCEDURES:  1. Pacemaker implantation.   INTRODUCTION:  Kristin [name] is a 80 y.o. female with a history of bradycardia who presents today for pacemaker implantation.  The patient reports intermittent episodes of dizziness over the past few months.  No reversible causes have been identified.  The patient therefore presents today for pacemaker implantation.   DESCRIPTION OF PROCEDURE:  Informed written consent was obtained, and  the patient was brought to the electrophysiology lab in a fasting state.  The patient required no sedation for the procedure today.  The patients left chest was  prepped and draped in the usual sterile fashion by the EP lab staff. The skin overlying the left deltopectoral region was infiltrated with lidocaine for local analgesia.  A 4-cm incision was made over the left deltopectoral region.  A left subcutaneous pacemaker pocket was  fashioned using a combination of sharp and blunt dissection. Electrocautery was required to assure hemostasis.  Left Upper Extremity Venography: A venogram of the left upper extremity was performed, which revealed a large left cephalic vein, which emptied into a large left subclavian vein.  The left axillary vein was moderate in size.  RA/RV Lead Placement: The left axillary vein was therefore cannulated.  Through the left axillary vein, a [employer] model Tendril MRI [id number] (serial number  [id number]) right atrial lead and a [employer] model Tendril MRI [id number] (serial number  [id number]) right ventricular lead were advanced with fluoroscopic visualization into the right atrial appendage and right ventricular apex positions respectively.  Initial atrial lead P- waves measured 1.5 mV with impedance of 463 ohms and a threshold of 0.5 V at 0.5 msec.  Right ventricular lead R-waves measured 14.2 mV with an impedance of 649 ohms and a threshold of 0.7 V at 0.5 msec.  Both leads were secured to the pectoralis fascia using #2-0 silk over the suture sleeves. LV Lead Placement: A St. Jude 135 guide was advanced through the left axillary vein into the low lateral right atrium. A Versicore wire was introduced through the guide and used to cannulate the coronary sinus. Coronary sinus cannulation was confirmed with electrogram recording from the hexapolar catheter. A selective coronary sinus venogram was performed by hand injection of nonionic contrast. This demonstrated a large CS body with very small/ atretic distal branches. There was a moderate sized lateral coronary sinus branch was noted along the mid portion of the CS body. No other posterior branches were identified. A Whisper CSJ wire was introduced through the guide and advanced into the distal posterolateral branch. [place] [id number] (serial number [id number] ) lead was advanced through the MB-2 into the lateral branch. This was approximately  one-thirds from the base to the apex in a very lateral position. In this location, the left ventricular lead R-waves measured 3.4 mV with impedance of 615 ohms and a threshold of 1.2 volt at 0.5 milliseconds in the bipolar LV1-LV2 configuration with no diaphragmatic stimulation observed when pacing at 10 volts output. The MB-2 guide was therefore removed. All three leads were secured to the pectoralis fascia using #2 silk suture over the suture sleeves. The pocket then irrigated with copious gentamicin solution. Device Placement:  The leads were then connected to a [facility] MP  model [id number] (serial number  [id number] ) pacemaker.  The pocket was irrigated with copious gentamicin solution.  The pacemaker was then placed into the pocket.  The pocket was then closed in 3 layers with 2.0 Vicryl suture for the 3.0 Vicryl suture subcutaneous and subcuticular layers.  Steri-  Strips and a sterile dressing were then applied. EBL<8m.  There were no early apparent complications.   CONCLUSIONS:  1. Successful implantation of a SJakinMP CRT-pacemaker for symptomatic bradycardia  2. No early apparent complications.       Will CCurt Bears MD 07/27/2020 5:33 PM    Cardiac Studies:  Assessment/Plan:  Acute non-Q wave myocardial infarction status post left cardiac catheterization noted to have three-vessel coronary artery disease status post CABG x5 complicated postoperatively by cardiac tamponade  requiring evacuation of the hematomapostop day5doing well Status post acute respiratory failure status post extubation tolerating well on nasal cannula Status post transient asystole now pacemaker dependent status post permanent pacemaker CRT-P Hypertension Diabetes mellitus Postop anemia  Thrombocytopenia improved Acute on chronic kidney injuryimproved Morbid obesity Status post CCU psychosis Deconditioning Plan Continue present management. Increase ambulation as  tolerated Awaiting inpatient rehabilitation  LOS: 8 days    Charolette Forward 07/28/2020, 2:04 PM

## 2020-07-28 NOTE — Progress Notes (Signed)
Physical Therapy Treatment Patient Details Name: Kristin [name] MRN: [id number] DOB: January 29, 1941 Today's Date: 07/28/2020    History of Present Illness Kristin [name] is a 80 y/o female admitted 3/23 from [place] with emergent cath then to ICU on 07/21/20. Pt with NSTEMI and underwent CABGx5 on 07/22/20. Post-op tamponade requiring return to OR on 3/25 for hematoma evacuation and then extubated on 3/26. PMH includes DM,HTN, HLD, and history of gallbladder surgery.    PT Comments    Pt progress steadily toward goals.  Emphasis on transitions, sit to stands from different heights, progressing gait and to chair for sitting tolerance.    Follow Up Recommendations  CIR;Supervision for mobility/OOB     Equipment Recommendations  Rolling walker with 5" wheels;3in1 (PT)    Recommendations for Other Services Rehab consult     Precautions / Restrictions Precautions Precautions: Sternal;Fall;Other (comment) Precaution Booklet Issued: Yes (comment) Precaution Comments: external pacer    Mobility  Bed Mobility Overal bed mobility: Needs Assistance Bed Mobility: Supine to Sit     Supine to sit: Mod assist     General bed mobility comments: cues for sequencing, pt bridged to EOB without assist.  Pt needed truncal assist due to unable to build enough momentum to come up without UE's.    Transfers Overall transfer level: Needs assistance   Transfers: Sit to/from Stand Sit to Stand: Min assist;Mod assist (depending on surface height)         General transfer comment: cues for sternal precautions/where to put hands.  assist forward and boosting,.  Ambulation/Gait Ambulation/Gait assistance: Min assist Gait Distance (Feet): 22 Feet (x2 with RW) Assistive device: Rolling walker (2 wheeled) Gait Pattern/deviations: Step-through pattern   Gait velocity interpretation: <1.8 ft/sec, indicate of risk for recurrent falls General Gait Details: cues for better posture and proximity to the RW.   Min stability assist at times, help manevering the RW in close quarters.  mildly unsteady/guarded.   Stairs             Wheelchair Mobility    Modified Rankin (Stroke Patients Only)       Balance     Sitting balance-Leahy Scale: Fair       Standing balance-Leahy Scale: Poor                              Cognition Arousal/Alertness: Awake/alert Behavior During Therapy: WFL for tasks assessed/performed Overall Cognitive Status: Within Functional Limits for tasks assessed                                        Exercises Other Exercises Other Exercises: warm up hip/knee flexion/ext  AROM with graded resistance in gross extension x10 reps.    General Comments General comments (skin integrity, edema, etc.): SpO2 maintain in low 90's overall (when waveform not noisy),  HR in the 90's, so overt dyspnea.      Pertinent Vitals/Pain Pain Assessment: Faces Faces Pain Scale: Hurts a little bit Pain Location: L shd Pain Descriptors / Indicators: Discomfort Pain Intervention(s): Monitored during session    Home Living                      Prior Function            PT Goals (current goals can now be found in the care plan section) Acute  Rehab PT Goals Patient Stated Goal: get stronger, stand easier PT Goal Formulation: With patient Time For Goal Achievement: 08/09/20 Potential to Achieve Goals: Good Progress towards PT goals: Progressing toward goals    Frequency    Min 3X/week      PT Plan Current plan remains appropriate    Co-evaluation              AM-PAC PT "6 Clicks" Mobility   Outcome Measure  Help needed turning from your back to your side while in a flat bed without using bedrails?: A Little Help needed moving from lying on your back to sitting on the side of a flat bed without using bedrails?: A Lot Help needed moving to and from a bed to a chair (including a wheelchair)?: A Little Help needed  standing up from a chair using your arms (e.g., wheelchair or bedside chair)?: A Lot Help needed to walk in hospital room?: A Little Help needed climbing 3-5 steps with a railing? : A Lot 6 Click Score: 15    End of Session   Activity Tolerance: Patient tolerated treatment well;Patient limited by fatigue Patient left: in chair;with call bell/phone within reach Nurse Communication: Mobility status PT Visit Diagnosis: Unsteadiness on feet (R26.81);Muscle weakness (generalized) (M62.81);Difficulty in walking, not elsewhere classified (R26.2)     Time: [id number] PT Time Calculation (min) (ACUTE ONLY): 29 min  Charges:  $Gait Training: 8-22 mins $Therapeutic Activity: 8-22 mins                     07/28/2020  [name], PT Acute Rehabilitation Services [phone]  (pager) [phone]  (office)   [name] 07/28/2020, 1:26 PM

## 2020-07-28 NOTE — Progress Notes (Signed)
  Speech Language Pathology Treatment: Dysphagia  Patient Details Name: Kristin [name] MRN: [id number] DOB: 1941/01/31 Today's Date: 07/28/2020 Time: [id number] SLP Time Calculation (min) (ACUTE ONLY): 13 min  Assessment / Plan / Recommendation Clinical Impression  Pt continues to complain of a sore throat which she feels is impacting her ability to eat and drink. Intermittent throat clearing was noted as well as multiple swallows throughout PO trials; however, no other overt s/s of aspiration were noted. Throat clearing and multiple swallows may be a compensatory strategy that she uses for her sore throat. Pt reports that she has been more easily able to swallow consistencies such as orange sherbet or yogurt. Provided education on similar consistencies that she could order for meals that are within her comfort. Suggested that she attempt more solid consistencies within current diet as her throat begins to feel better. Anticipate that her oropharyngeal swallow will be [facility] as her pain subsides. Recommend a continued DYS 2 diet with thin liquids via cup (no straw). SLP will continue to follow for potential advancement of solids.    HPI HPI: Pt is a 80 yo female admitted with NSTEMI, cadiogenic shock s/p emergent CABG x5 on 3/24. She returned to the OR on 3/25 for tamponade requiring evacuation of hematoma. ETT 3/24-3/26. PMH includes DM, obesity, HTN, HL      SLP Plan  Continue with current plan of care       Recommendations  Diet recommendations: Dysphagia 2 (fine chop);Thin liquid Liquids provided via: Cup;No straw Medication Administration: Whole meds with puree (crushed if larger) Supervision: Patient able to self feed;Intermittent supervision to cue for compensatory strategies Compensations: Slow rate;Small sips/bites Postural Changes and/or Swallow Maneuvers: Seated upright 90 degrees                Oral Care Recommendations: Oral care BID Follow up Recommendations: Skilled  Nursing facility;24 hour supervision/assistance SLP Visit Diagnosis: Dysphagia, unspecified (R13.10) Plan: Continue with current plan of care       GO                Jeanine Luz., SLP Student 07/28/2020, 10:11 AM

## 2020-07-28 NOTE — Progress Notes (Signed)
CARDIAC REHAB PHASE I   Offered to walk with pt. Pt states she feels weak today and was told she didn't have to move if she didn't feel up to it. Stressed importance of ambulation as it relates to recovery. Pt requesting time to rest and willing to work with PT later on today. L arm propped on pillow. Encouraged continued IS use. Will continue to follow.  [id number] [name], RN BSN 07/28/2020 10:31 AM

## 2020-07-28 NOTE — Progress Notes (Signed)
Patient EPW pulled per protocol and as ordered. All ends intact patient reminded to lie supine approximately one hour. bp 130/55 heart rate 80. Will monitor patient. Judithe Keetch, Bettina Gavia RN

## 2020-07-28 NOTE — Care Management Important Message (Signed)
Important Message  Patient Details  Name: Kristin [name] MRN: [id number] Date of Birth: [birth year]   Medicare Important Message Given:  Yes     [name] 07/28/2020, 8:58 AM

## 2020-07-28 NOTE — Progress Notes (Signed)
IP rehab admissions - Continuing to follow.  Noted PT saw patient today and patient has functional needs.  Awaiting OT update.  I will open the case with [facility] and request acute inpatient rehab admission.  I will update all once I hear back from insurance case manager.  Call for questions.  [phone]

## 2020-07-28 NOTE — Progress Notes (Addendum)
Progress Note  Patient Name: Kristin [name] Date of Encounter: 07/28/2020  [facility] Cardiologist: Dr. Terrence Dupont  Subjective   Feels a little better today  Inpatient Medications    Scheduled Meds:  acetaminophen (TYLENOL) oral liquid 160 mg/5 mL  1,000 mg Oral Q6H   Or   acetaminophen  1,000 mg Oral Q6H   aspirin  81 mg Oral Daily   atorvastatin  80 mg Oral q1800   bisacodyl  10 mg Oral Daily   Or   bisacodyl  10 mg Rectal Daily   Chlorhexidine Gluconate Cloth  6 each Topical Daily   clopidogrel  75 mg Oral Daily   docusate  200 mg Oral Daily   Or   docusate sodium  200 mg Oral Daily   enoxaparin (LOVENOX) injection  30 mg Subcutaneous QHS   feeding supplement  237 mL Oral TID BM   furosemide  80 mg Intravenous BID   insulin aspart  0-24 Units Subcutaneous Q4H   insulin detemir  10 Units Subcutaneous Daily   mouth rinse  15 mL Mouth Rinse BID   megestrol  40 mg Oral Daily   melatonin  3 mg Oral QHS   midodrine  5 mg Oral TID WC   multivitamin with minerals  1 tablet Oral Daily   pantoprazole sodium  40 mg Oral Daily   Or   pantoprazole  40 mg Oral Daily   pneumococcal 23 valent vaccine  0.5 mL Intramuscular Tomorrow-1000   polyethylene glycol  17 g Oral Daily   potassium chloride  40 mEq Oral BID   QUEtiapine  50 mg Oral QHS   sodium chloride flush  3 mL Intravenous Q12H   Continuous Infusions:   ceFAZolin (ANCEF) IV Stopped (07/28/20 0427)   PRN Meds: acetaminophen, dextrose, guaiFENesin-dextromethorphan, magic mouthwash, metoprolol tartrate, ondansetron (ZOFRAN) IV, oxyCODONE, traMADol   Vital Signs    Vitals:   07/27/20 2114 07/27/20 2315 07/28/20 0318 07/28/20 0838  BP: (!) 136/59 104/68 (!) 112/50 (!) 118/49  Pulse: 86 93 88 70  Resp: '20 20 20 20  '$ Temp: 97.8 F (36.6 C) 99.2 F (37.3 C) 97.8 F (36.6 C)   TempSrc: Oral Oral Oral   SpO2: 93% 92% 95% 94%  Weight:      Height:        Intake/Output Summary (Last 24 hours) at 07/28/2020  0924 Last data filed at 07/28/2020 0634 Gross per 24 hour  Intake 368.09 ml  Output 1300 ml  Net -931.91 ml   Last 3 Weights 07/27/2020 07/26/2020 07/25/2020  Weight (lbs) 186 lb 12.8 oz 189 lb 2.5 oz 198 lb 3.1 oz  Weight (kg) 84.732 kg 85.8 kg 89.9 kg      Telemetry    AV pacing >> underlying is an accel junctional rhythm 50's-60's - Personally Reviewed  ECG    Yesterday CHB, V pacing - Personally Reviewed  Physical Exam   GEN: No acute distress.   Neck: No JVD Cardiac: RRR, no murmurs, rubs, or gallops.  Respiratory: decreased at the bases. GI: Soft, nontender, non-distended  MS:  trace edema; No deformity. Neuro:  Nonfocal  Psych: Normal affect   PPM site dressing is clean, dry, intact, no hematoma  Labs    High Sensitivity Troponin:   Recent Labs  Lab 07/20/20 1538 07/20/20 1729  TROPONINIHS 3,198* 3,282*      Chemistry Recent Labs  Lab 07/26/20 0357 07/26/20 1202 07/26/20 1401 07/27/20 0210 07/28/20 0059  NA 145 142  --  144 142  K 3.2* 7.0* 4.9 3.8 3.9  CL 111 106  --  106 105  CO2 28 29  --  31 31  GLUCOSE 122* 272*  --  96 187*  BUN 30* 34*  --  34* 33*  CREATININE 1.44* 1.72*  --  1.68* 1.72*  CALCIUM 7.3* 8.0*  --  8.2* 8.1*  PROT 4.2*  --   --   --   --   ALBUMIN 2.5*  --   --   --   --   AST 321*  --   --   --   --   ALT 938*  --   --   --   --   ALKPHOS 94  --   --   --   --   BILITOT 1.3*  --   --   --   --   GFRNONAA 37* 30*  --  31* 30*  ANIONGAP 6 7  --  7 6     Hematology Recent Labs  Lab 07/26/20 0357 07/27/20 0210 07/28/20 0059  WBC 7.5 9.0 7.7  RBC 2.88* 3.09* 3.03*  HGB 9.1* 9.6* 9.4*  HCT 27.8* 30.5* 30.1*  MCV 96.5 98.7 99.3  MCH 31.6 31.1 31.0  MCHC 32.7 31.5 31.2  RDW 16.7* 17.1* 17.6*  PLT 79* 107* 142*    BNPNo results for input(s): BNP, PROBNP in the last 168 hours.   DDimer No results for input(s): DDIMER in the last 168 hours.   Radiology    07/27/20 CXR IMPRESSION: Small bibasilar pleural  effusions and atelectasis.  Cardiac Studies   07/22/20: TEE LEFT VENTRICLE: EF = 40-45%.The LV has marked LVH and is underfilled.There is external compression of the LV from a fluid collection posterior an lateral to the heart.  RIGHT VENTRICLE: Moderate HK + swan LEFT ATRIUM: Dilated. Mild external compression  LEFT ATRIAL APPENDAGE: Not visualized RIGHT ATRIUM: Dialted AORTIC VALVE:  Trileaflet. No AI/AS MITRAL VALVE:    Normal. Trivial MR TRICUSPID VALVE: Normal. Trivial TR PULMONIC VALVE: Normal INTERATRIAL SEPTUM: Not well visualized PERICARDIUM: Large echodense collection posterior and lateral to heart.  DESCENDING AORTA: Not visualized     07/21/20: LHC Mid LM lesion is 95% stenosed. Ost Cx lesion is 99% stenosed. Dist LM to Ost LAD lesion is 95% stenosed. Mid LAD lesion is 95% stenosed. Prox Cx lesion is 90% stenosed. Prox RCA to Mid RCA lesion is 80% stenosed. RPDA lesion is 80% stenosed. RPAV lesion is 80% stenosed. Ost LM lesion is 30% stenosed.         07/20/20; TTE IMPRESSIONS   1. Left ventricular ejection fraction, by estimation, is 40 to 45%. The  left ventricle has mild to moderately decreased function. The left  ventricle demonstrates regional wall motion abnormalities (see scoring  diagram/findings for description). Left  ventricular diastolic parameters are consistent with Grade I diastolic  dysfunction (impaired relaxation).   2. Right ventricular systolic function is normal. The right ventricular  size is normal.   3. The mitral valve is normal in structure. Mild mitral valve  regurgitation.   4. The aortic valve is calcified. Aortic valve regurgitation is not  visualized. Mild aortic valve sclerosis is present, with no evidence of  aortic valve stenosis.   5. The inferior vena cava is normal in size with <50% respiratory  variability, suggesting right atrial pressure of 8 mmHg.     Patient Profile     80 y.o. female with a hx of  DM, obesity,  HTN admitted with NSTEMI > LHC noted critical LM and Multivessel CAD > IABP > emergent CABG 07/22/20 (x5 (LIMA to LAD, SEQ SVG to OM and Diagonal, SEQ to PL and PDA)  Admitted with NSTEMI > LHC noted critical LM disease > IABP > emergent CABG 07/21/20,  Post op shock with suspect tamponade, TEE confirmed  Post op with tamponade and brought back to the OR 07/22/20   Extubated 07/22/20, epi 3, milrinone 0.125, and VP 0.02 3/26 very agitated, remained on NE 3, milrinone 0.125 and precedex 07/24/20 remained confused yesterday CO-OX 65%. Norepi 4 mcg + milrinone 0.125 mcg Na lower 145      Plts 47K => 50=>64K.  Creatinine 1.1 -> 1.4 -> 1.7 -> 1.9 -> 2.01->1.7  Today  CT out yesterday Off inotrope/pressors   Assessment & Plan    1. P/o CHB     s/p PPM implant yesterday (CRT-P)  Site dressing is CDI, no hematoma device check this AM with intact function CXR this AM with no PTX Wound care and activity restrictions were discussed with the patient Routine EP follow up is in place  OK to resume plavix today Prefer to leave tegaderm on until day of discharge PLEASE remove tegaderm on day of discharge DO NOT remove steri strips    2. Cardiogenic shock post-op (tamponade)   Heart failure team on board   Off pressors   On midodrine 3. CAD   Post CABG 4. AKI   Creat waxing/waning 5. P/o anemia, thrombocytopenia    H/H stable, plts up some more > 107 this AM  6. New Aflutter     Brief episode yesterday, 30min last night on pacer check     Short episode this AM     This can be monitored for burden via her device   Dr. CCurt Bearshas seen and examined the patient this morning EP service Bryston Colocho sign off though remain available, please recall if neeed    For questions or updates, please contact CJamulPlease consult [email or web address] for contact info under      Signed, RBaldwin Jamaica PA-C  07/28/2020, 9:24 AM   9:24 AM  I have seen and examined this patient with RTommye Standard   Agree with above, note added to reflect my findings.  On exam, RRR, no murmurs. Post ST. Jude pacemaker CRT. Interrogation and CXR with stable leads. Diuresis per CHF team. Has had episodes of AF/flutter. Would monitor for now and if longer episodes would consider anticoagulation.  Overall burden can be monitored through the device.  Daryana Whirley M. Anistyn Graddy MD 07/28/2020 4:56 PM

## 2020-07-28 NOTE — Progress Notes (Addendum)
      [address]       [place] [address]             [phone]      1 Day Post-Op Procedure(s) (LRB): BIV PACEMAKER INSERTION CRT-P (N/A)   Subjective:  Patient doing okay.  She denies pain, but overall she states she is so weak and her muscles are sore.  Objective: Vital signs in last 24 hours: Temp:  [97.8 F (36.6 C)-99.2 F (37.3 C)] 97.8 F (36.6 C) (03/31 0318) Pulse Rate:  [70-113] 88 (03/31 0318) Cardiac Rhythm: Heart block;Bundle branch block (03/30 1923) Resp:  [12-25] 20 (03/31 0318) BP: (100-146)/(39-95) 112/50 (03/31 0318) SpO2:  [88 %-100 %] 95 % (03/31 0318)  Intake/Output from previous day: 03/30 0701 - 03/31 0700 In: 608.1 [P.O.:490; I.V.:18.1; IV Piggyback:100] Out: 1950 [Urine:1800; Emesis/NG output:150]  General appearance: alert, cooperative and no distress Heart: regular rate and rhythm, paced Lungs: clear to auscultation bilaterally Abdomen: soft, non-tender; bowel sounds normal; no masses,  no organomegaly Extremities: extremities normal, atraumatic, no cyanosis or edema Wound: clean and dry  Lab Results: Recent Labs    07/27/20 0210 07/28/20 0059  WBC 9.0 7.7  HGB 9.6* 9.4*  HCT 30.5* 30.1*  PLT 107* 142*   BMET:  Recent Labs    07/27/20 0210 07/28/20 0059  NA 144 142  K 3.8 3.9  CL 106 105  CO2 31 31  GLUCOSE 96 187*  BUN 34* 33*  CREATININE 1.68* 1.72*  CALCIUM 8.2* 8.1*    PT/INR: No results for input(s): LABPROT, INR in the last 72 hours. ABG    Component Value Date/Time   PHART 7.442 07/23/2020 0434   HCO3 29.2 (H) 07/23/2020 0434   TCO2 31 07/23/2020 0434   ACIDBASEDEF 1.0 07/22/2020 0814   O2SAT 59.9 07/26/2020 0357   CBG (last 3)  Recent Labs    07/28/20 0004 07/28/20 0323 07/28/20 0758  GLUCAP 209* 146* 181*    Assessment/Plan: S/P Procedure(s) (LRB): BIV PACEMAKER INSERTION CRT-P (N/A)  1. CV- S/P PPM yesterday, BP improved remains on Midodrine 5 mg TID, may be able to  discontinue 2. Pulm- no acute issues, off oxygen 3. Renal- creatinine stable, weight remains elevated, currently diuresing 4. Expected post operative blood loss anemia, Hgb stable at 9.4 5. DM- sugars are elevated at times 6. Dysphagia- working with SLP, will progress diet as recommended 7. Deconditioning- working with PT/OT, will need CIR at discharge 8. Dispo- patient stable, continue current care   LOS: 8 days    Ellwood Handler, PA-C 07/28/2020 Pt seen and examined; doing well after PPM. D/c planning. Austin Pongratz Z. Orvan Seen, [place]

## 2020-07-28 NOTE — Progress Notes (Signed)
Inpatient Diabetes Program Recommendations  AACE/ADA: New Consensus Statement on Inpatient Glycemic Control (2015)  Target Ranges:  Prepandial:   less than 140 mg/dL      Peak postprandial:   less than 180 mg/dL (1-2 hours)      Critically ill patients:  140 - 180 mg/dL   Lab Results  Component Value Date   GLUCAP 311 (H) 07/28/2020   HGBA1C 8.8 (H) 07/20/2020    Review of Glycemic Control  Post-prandials elevated.   Current orders for Inpatient glycemic control:  Lantus 10 units QD, Novolog 0-24 units Q4H  Inpatient Diabetes Program Recommendations:     Consider addition of Novolog 5 units TID for meal coverage insulin  Continue to follow.   Thank you. Lorenda Peck, RD, LDN, CDE Inpatient Diabetes Coordinator [phone]

## 2020-07-29 DIAGNOSIS — I214 Non-ST elevation (NSTEMI) myocardial infarction: Secondary | ICD-10-CM | POA: Diagnosis not present

## 2020-07-29 DIAGNOSIS — Z951 Presence of aortocoronary bypass graft: Secondary | ICD-10-CM | POA: Diagnosis not present

## 2020-07-29 LAB — GLUCOSE, CAPILLARY
Glucose-Capillary: 148 mg/dL — ABNORMAL HIGH (ref 70–99)
Glucose-Capillary: 200 mg/dL — ABNORMAL HIGH (ref 70–99)
Glucose-Capillary: 203 mg/dL — ABNORMAL HIGH (ref 70–99)
Glucose-Capillary: 155 mg/dL — ABNORMAL HIGH (ref 70–99)

## 2020-07-29 LAB — BASIC METABOLIC PANEL
Anion gap: 8 (ref 5–15)
BUN: 30 mg/dL — ABNORMAL HIGH (ref 8–23)
CO2: 30 mmol/L (ref 22–32)
Calcium: 8.1 mg/dL — ABNORMAL LOW (ref 8.9–10.3)
Chloride: 102 mmol/L (ref 98–111)
Creatinine, Ser: 1.63 mg/dL — ABNORMAL HIGH (ref 0.44–1.00)
GFR, Estimated: 32 mL/min — ABNORMAL LOW (ref >60)
Glucose, Bld: 144 mg/dL — ABNORMAL HIGH (ref 70–99)
Potassium: 3.6 mmol/L (ref 3.5–5.1)
Sodium: 140 mmol/L (ref 135–145)

## 2020-07-29 LAB — CBC
HCT: 28.8 % — ABNORMAL LOW (ref 36.0–46.0)
Hemoglobin: 9.1 g/dL — ABNORMAL LOW (ref 12.0–15.0)
MCH: 31.8 pg (ref 26.0–34.0)
MCHC: 31.6 g/dL (ref 30.0–36.0)
MCV: 100.7 fL — ABNORMAL HIGH (ref 80.0–100.0)
Platelets: 204 10*3/uL (ref 150–400)
RBC: 2.86 MIL/uL — ABNORMAL LOW (ref 3.87–5.11)
RDW: 17.6 % — ABNORMAL HIGH (ref 11.5–15.5)
WBC: 8.4 10*3/uL (ref 4.0–10.5)
nRBC: 1.5 % — ABNORMAL HIGH (ref 0.0–0.2)

## 2020-07-29 LAB — MAGNESIUM: Magnesium: 1.9 mg/dL (ref 1.7–2.4)

## 2020-07-29 MED ORDER — INSULIN ASPART 100 UNIT/ML ~~LOC~~ SOLN
0.0000 [IU] | Freq: Three times a day (TID) | SUBCUTANEOUS | Status: DC
  Administered 2020-07-29: 4 [IU] via SUBCUTANEOUS
  Administered 2020-07-29 – 2020-07-30 (×2): 8 [IU] via SUBCUTANEOUS
  Administered 2020-07-30: 4 [IU] via SUBCUTANEOUS
  Administered 2020-07-30: 8 [IU] via SUBCUTANEOUS
  Administered 2020-07-31 (×2): 16 [IU] via SUBCUTANEOUS
  Administered 2020-07-31: 2 [IU] via SUBCUTANEOUS
  Administered 2020-08-01: 8 [IU] via SUBCUTANEOUS
  Administered 2020-08-01: 4 [IU] via SUBCUTANEOUS
  Administered 2020-08-01: 12 [IU] via SUBCUTANEOUS
  Administered 2020-08-02: 4 [IU] via SUBCUTANEOUS
  Administered 2020-08-02 – 2020-08-03 (×3): 8 [IU] via SUBCUTANEOUS
  Administered 2020-08-03 – 2020-08-04 (×2): 4 [IU] via SUBCUTANEOUS
  Administered 2020-08-04: 8 [IU] via SUBCUTANEOUS
  Administered 2020-08-05: 4 [IU] via SUBCUTANEOUS
  Administered 2020-08-05: 8 [IU] via SUBCUTANEOUS
  Administered 2020-08-05 – 2020-08-06 (×2): 2 [IU] via SUBCUTANEOUS
  Administered 2020-08-06 – 2020-08-07 (×2): 8 [IU] via SUBCUTANEOUS
  Administered 2020-08-07: 2 [IU] via SUBCUTANEOUS
  Administered 2020-08-07: 4 [IU] via SUBCUTANEOUS
  Administered 2020-08-08: 16 [IU] via SUBCUTANEOUS
  Administered 2020-08-08: 2 [IU] via SUBCUTANEOUS
  Administered 2020-08-08 – 2020-08-09 (×2): 4 [IU] via SUBCUTANEOUS
  Administered 2020-08-09: 12 [IU] via SUBCUTANEOUS
  Administered 2020-08-09: 2 [IU] via SUBCUTANEOUS
  Administered 2020-08-10: 8 [IU] via SUBCUTANEOUS
  Administered 2020-08-10: 2 [IU] via SUBCUTANEOUS

## 2020-07-29 MED ORDER — POTASSIUM CHLORIDE CRYS ER 20 MEQ PO TBCR
20.0000 meq | EXTENDED_RELEASE_TABLET | Freq: Every day | ORAL | Status: DC
  Administered 2020-07-29 – 2020-08-01 (×3): 20 meq via ORAL
  Filled 2020-07-29 (×3): qty 1

## 2020-07-29 MED ORDER — FUROSEMIDE 40 MG PO TABS
40.0000 mg | ORAL_TABLET | Freq: Every day | ORAL | Status: AC
  Administered 2020-07-29 – 2020-07-31 (×3): 40 mg via ORAL
  Filled 2020-07-29: qty 2
  Filled 2020-07-29 (×2): qty 1

## 2020-07-29 MED ORDER — POTASSIUM CHLORIDE CRYS ER 20 MEQ PO TBCR
20.0000 meq | EXTENDED_RELEASE_TABLET | Freq: Once | ORAL | Status: DC
  Filled 2020-07-29: qty 1

## 2020-07-29 NOTE — Progress Notes (Signed)
Subjective:  Denies any chest pain or shortness of breath states gradually improving  Objective:  Vital Signs in the last 24 hours: Temp:  [97.8 F (36.6 C)-98.9 F (37.2 C)] 98.5 F (36.9 C) (04/01 0741) Pulse Rate:  [35-126] 79 (04/01 0741) Resp:  [16-20] 20 (04/01 0741) BP: (101-131)/(45-75) 109/49 (04/01 0741) SpO2:  [44 %-100 %] 96 % (04/01 0741) Weight:  [83.2 kg] 83.2 kg (04/01 0400)  Intake/Output from previous day: 03/31 0701 - 04/01 0700 In: 530 [P.O.:480; IV Piggyback:50] Out: 600 [Urine:600] Intake/Output from this shift: No intake/output data recorded.  Physical Exam: Neck: no adenopathy, no carotid bruit, no JVD and supple, symmetrical, trachea midline Lungs: clear to auscultation bilaterally Heart: regular rate and rhythm and S1, S2 normal Abdomen: soft, non-tender; bowel sounds normal; no masses,  no organomegaly Extremities: extremities normal, atraumatic, no cyanosis or edema  Lab Results: Recent Labs    07/28/20 0059 07/29/20 0154  WBC 7.7 8.4  HGB 9.4* 9.1*  PLT 142* 204   Recent Labs    07/28/20 0059 07/29/20 0154  NA 142 140  K 3.9 3.6  CL 105 102  CO2 31 30  GLUCOSE 187* 144*  BUN 33* 30*  CREATININE 1.72* 1.63*   No results for input(s): TROPONINI in the last 72 hours.  Invalid input(s): CK, MB Hepatic Function Panel No results for input(s): PROT, ALBUMIN, AST, ALT, ALKPHOS, BILITOT, BILIDIR, IBILI in the last 72 hours. No results for input(s): CHOL in the last 72 hours. No results for input(s): PROTIME in the last 72 hours.  Imaging: Imaging results have been reviewed and DG Chest 2 View  Result Date: 07/28/2020 CLINICAL DATA:  Cardiac device in situ. EXAM: CHEST - 2 VIEW COMPARISON:  07/27/2020. FINDINGS: AICD noted with lead tips in good anatomic position. Prior CABG. Cardiomegaly. Bilateral mild interstitial prominence suggesting interstitial edema. Small bilateral pleural effusions. Findings consistent with CHF. No  pneumothorax. IMPRESSION: 1. AICD noted with lead tips in good anatomic position. No pneumothorax. 2. Prior CABG. Bilateral interstitial prominence and small bilateral pleural effusions. Findings consistent with CHF. Electronically Signed   By: Marcello Moores  Register   On: 07/28/2020 06:30   EP PPM/ICD IMPLANT  Result Date: 07/27/2020 SURGEON:  Allegra Lai, MD   PREPROCEDURE DIAGNOSIS:  Complete AV block   POSTPROCEDURE DIAGNOSIS:  Complete AV block    PROCEDURES:  1. Pacemaker implantation.   INTRODUCTION:  Kristin [name] is a 80 y.o. female with a history of bradycardia who presents today for pacemaker implantation.  The patient reports intermittent episodes of dizziness over the past few months.  No reversible causes have been identified.  The patient therefore presents today for pacemaker implantation.   DESCRIPTION OF PROCEDURE:  Informed written consent was obtained, and  the patient was brought to the electrophysiology lab in a fasting state.  The patient required no sedation for the procedure today.  The patients left chest was prepped and draped in the usual sterile fashion by the EP lab staff. The skin overlying the left deltopectoral region was infiltrated with lidocaine for local analgesia.  A 4-cm incision was made over the left deltopectoral region.  A left subcutaneous pacemaker pocket was fashioned using a combination of sharp and blunt dissection. Electrocautery was required to assure hemostasis.  Left Upper Extremity Venography: A venogram of the left upper extremity was performed, which revealed a large left cephalic vein, which emptied into a large left subclavian vein.  The left axillary vein was moderate in size.  RA/RV Lead Placement: The left axillary vein was therefore cannulated.  Through the left axillary vein, a [employer] model Tendril MRI [id number] (serial number  [id number]) right atrial lead and a [employer] model Tendril MRI [id number] (serial number  [id number]) right ventricular  lead were advanced with fluoroscopic visualization into the right atrial appendage and right ventricular apex positions respectively.  Initial atrial lead P- waves measured 1.5 mV with impedance of 463 ohms and a threshold of 0.5 V at 0.5 msec.  Right ventricular lead R-waves measured 14.2 mV with an impedance of 649 ohms and a threshold of 0.7 V at 0.5 msec.  Both leads were secured to the pectoralis fascia using #2-0 silk over the suture sleeves. LV Lead Placement: A St. Jude 135 guide was advanced through the left axillary vein into the low lateral right atrium. A Versicore wire was introduced through the guide and used to cannulate the coronary sinus. Coronary sinus cannulation was confirmed with electrogram recording from the hexapolar catheter. A selective coronary sinus venogram was performed by hand injection of nonionic contrast. This demonstrated a large CS body with very small/ atretic distal branches. There was a moderate sized lateral coronary sinus branch was noted along the mid portion of the CS body. No other posterior branches were identified. A Whisper CSJ wire was introduced through the guide and advanced into the distal posterolateral branch. [place] [phone] (serial number [id number] ) lead was advanced through the MB-2 into the lateral branch. This was approximately one-thirds from the base to the apex in a very lateral position. In this location, the left ventricular lead R-waves measured 3.4 mV with impedance of 615 ohms and a threshold of 1.2 volt at 0.5 milliseconds in the bipolar LV1-LV2 configuration with no diaphragmatic stimulation observed when pacing at 10 volts output. The MB-2 guide was therefore removed. All three leads were secured to the pectoralis fascia using #2 silk suture over the suture sleeves. The pocket then irrigated with copious gentamicin solution. Device Placement:  The leads were then connected to a [facility] MP  model [id number] (serial number   [id number] ) pacemaker.  The pocket was irrigated with copious gentamicin solution.  The pacemaker was then placed into the pocket.  The pocket was then closed in 3 layers with 2.0 Vicryl suture for the 3.0 Vicryl suture subcutaneous and subcuticular layers.  Steri-  Strips and a sterile dressing were then applied. EBL<65m.  There were no early apparent complications.   CONCLUSIONS:  1. Successful implantation of a [place] CRT-pacemaker for symptomatic bradycardia  2. No early apparent complications.       Will CCurt Bears MD 07/27/2020 5:33 PM    Cardiac Studies:  Assessment/Plan:  Acute non-Q wave myocardial infarction status post left cardiac catheterization noted to have three-vessel coronary artery disease status post CABG x5 complicated postoperatively by cardiac tamponade requiring evacuation of the hematomapostop day7doing well Status post acute respiratory failure status post extubation tolerating well on nasal cannula Status post transient asystole now pacemaker dependent status post permanent pacemaker CRT-P Hypertension Diabetes mellitus Postop anemia  Thrombocytopeniaimproved Acute on chronic kidney injuryimproved Morbid obesity Status postCCU psychosis Deconditioning Plan Continue present management Increase ambulation as tolerated Awaiting rehab I will sign off please call if needed Follow-up with me in 1 to 2 weeks We will add beta-blockers and ARB as outpatient as blood pressure tolerates    LOS: 9 days    HCharolette Forward  07/29/2020, 11:03 AM

## 2020-07-29 NOTE — Progress Notes (Addendum)
Patient ID: Kristin [name], female   DOB: 1941/03/26, 80 y.o.   MRN: [id number]    Cardiologist: Terrence Dupont  Advanced Heart Failure Rounding Note   Subjective:    - 3/24 NSTEMI - 3/24 Emergent CABG x 5 (LIMA to LAD, SEQ SVG to OM and Diagonal, SEQ to PL and PDA) - 3/25 take back to OR for tamponade. EF on TEE 55%, RV mildly down (3/25) -3/26 Extubated -3/28 Milrinone stopped. Midodrine started.  -3/30 S/P CRT-P. Received 80 mg IV lasix x2. I/O not accurate.   She denies any chest discomfort.  She was up walking with PT said she didn't get short of breath but was very weak.       Objective:   Weight Range:  Vital Signs:   Temp:  [97.6 F (36.4 C)-98.9 F (37.2 C)] 97.6 F (36.4 C) (04/01 1133) Pulse Rate:  [35-126] 91 (04/01 1133) Resp:  [16-20] 18 (04/01 1133) BP: (101-128)/(45-75) 106/52 (04/01 1133) SpO2:  [44 %-100 %] 93 % (04/01 1133) Weight:  [83.2 kg] 83.2 kg (04/01 0400) Last BM Date: 07/28/20  Weight change: Filed Weights   07/27/20 0500 07/28/20 0839 07/29/20 0400  Weight: 84.7 kg 79.4 kg 83.2 kg    Intake/Output:   Intake/Output Summary (Last 24 hours) at 07/29/2020 1159 Last data filed at 07/29/2020 0631 Gross per 24 hour  Intake 290 ml  Output 600 ml  Net -310 ml      Physical Exam: Cardiac: JVD difficult to assess, normal rate and rhythm, clear s1 and s2, no murmurs, rubs or gallops, trace LE edema bilaterally Pulmonary: bibasilar rales, not in distress Abdominal: non distended abdomen, soft and nontender Psych: Alert, conversant, in good spirits    Telemetry: BiV pacing   Labs: Basic Metabolic Panel: Recent Labs  Lab 07/25/20 1032 07/25/20 1555 07/26/20 0357 07/26/20 1202 07/26/20 1401 07/27/20 0210 07/28/20 0059 07/29/20 0154  NA  --    < > 145 142  --  144 142 140  K  --    < > 3.2* 7.0* 4.9 3.8 3.9 3.6  CL  --    < > 111 106  --  106 105 102  CO2  --    < > 28 29  --  '31 31 30  '$ GLUCOSE  --    < > 122* 272*  --  96 187* 144*  BUN   --    < > 30* 34*  --  34* 33* 30*  CREATININE  --    < > 1.44* 1.72*  --  1.68* 1.72* 1.63*  CALCIUM  --    < > 7.3* 8.0*  --  8.2* 8.1* 8.1*  MG 1.8  --  1.8  --   --  2.3 2.0 1.9   < > = values in this interval not displayed.    Liver Function Tests: Recent Labs  Lab 07/26/20 0357  AST 321*  ALT 938*  ALKPHOS 94  BILITOT 1.3*  PROT 4.2*  ALBUMIN 2.5*   No results for input(s): LIPASE, AMYLASE in the last 168 hours. No results for input(s): AMMONIA in the last 168 hours.  CBC: Recent Labs  Lab 07/25/20 0257 07/26/20 0357 07/27/20 0210 07/28/20 0059 07/29/20 0154  WBC 8.3 7.5 9.0 7.7 8.4  HGB 8.6* 9.1* 9.6* 9.4* 9.1*  HCT 26.9* 27.8* 30.5* 30.1* 28.8*  MCV 96.4 96.5 98.7 99.3 100.7*  PLT 64* 79* 107* 142* 204    Cardiac Enzymes: No results for input(s): CKTOTAL,  CKMB, CKMBINDEX, TROPONINI in the last 168 hours.  BNP: BNP (last 3 results) No results for input(s): BNP in the last 8760 hours.  ProBNP (last 3 results) No results for input(s): PROBNP in the last 8760 hours.    Other results:  Imaging: DG Chest 2 View  Result Date: 07/28/2020 CLINICAL DATA:  Cardiac device in situ. EXAM: CHEST - 2 VIEW COMPARISON:  07/27/2020. FINDINGS: AICD noted with lead tips in good anatomic position. Prior CABG. Cardiomegaly. Bilateral mild interstitial prominence suggesting interstitial edema. Small bilateral pleural effusions. Findings consistent with CHF. No pneumothorax. IMPRESSION: 1. AICD noted with lead tips in good anatomic position. No pneumothorax. 2. Prior CABG. Bilateral interstitial prominence and small bilateral pleural effusions. Findings consistent with CHF. Electronically Signed   By: Marcello Moores  Register   On: 07/28/2020 06:30   EP PPM/ICD IMPLANT  Result Date: 07/27/2020 SURGEON:  Allegra Lai, MD   PREPROCEDURE DIAGNOSIS:  Complete AV block   POSTPROCEDURE DIAGNOSIS:  Complete AV block    PROCEDURES:  1. Pacemaker implantation.   INTRODUCTION:  Kristin [name]  is a 80 y.o. female with a history of bradycardia who presents today for pacemaker implantation.  The patient reports intermittent episodes of dizziness over the past few months.  No reversible causes have been identified.  The patient therefore presents today for pacemaker implantation.   DESCRIPTION OF PROCEDURE:  Informed written consent was obtained, and  the patient was brought to the electrophysiology lab in a fasting state.  The patient required no sedation for the procedure today.  The patients left chest was prepped and draped in the usual sterile fashion by the EP lab staff. The skin overlying the left deltopectoral region was infiltrated with lidocaine for local analgesia.  A 4-cm incision was made over the left deltopectoral region.  A left subcutaneous pacemaker pocket was fashioned using a combination of sharp and blunt dissection. Electrocautery was required to assure hemostasis.  Left Upper Extremity Venography: A venogram of the left upper extremity was performed, which revealed a large left cephalic vein, which emptied into a large left subclavian vein.  The left axillary vein was moderate in size.  RA/RV Lead Placement: The left axillary vein was therefore cannulated.  Through the left axillary vein, a [employer] model Tendril MRI [id number] (serial number  [id number]) right atrial lead and a [employer] model Tendril MRI [id number] (serial number  [id number]) right ventricular lead were advanced with fluoroscopic visualization into the right atrial appendage and right ventricular apex positions respectively.  Initial atrial lead P- waves measured 1.5 mV with impedance of 463 ohms and a threshold of 0.5 V at 0.5 msec.  Right ventricular lead R-waves measured 14.2 mV with an impedance of 649 ohms and a threshold of 0.7 V at 0.5 msec.  Both leads were secured to the pectoralis fascia using #2-0 silk over the suture sleeves. LV Lead Placement: A St. Jude 135 guide was advanced through the left  axillary vein into the low lateral right atrium. A Versicore wire was introduced through the guide and used to cannulate the coronary sinus. Coronary sinus cannulation was confirmed with electrogram recording from the hexapolar catheter. A selective coronary sinus venogram was performed by hand injection of nonionic contrast. This demonstrated a large CS body with very small/ atretic distal branches. There was a moderate sized lateral coronary sinus branch was noted along the mid portion of the CS body. No other posterior branches were identified. A Whisper CSJ wire was introduced  through the guide and advanced into the distal posterolateral branch. [place] [id number] (serial number [id number] ) lead was advanced through the MB-2 into the lateral branch. This was approximately one-thirds from the base to the apex in a very lateral position. In this location, the left ventricular lead R-waves measured 3.4 mV with impedance of 615 ohms and a threshold of 1.2 volt at 0.5 milliseconds in the bipolar LV1-LV2 configuration with no diaphragmatic stimulation observed when pacing at 10 volts output. The MB-2 guide was therefore removed. All three leads were secured to the pectoralis fascia using #2 silk suture over the suture sleeves. The pocket then irrigated with copious gentamicin solution. Device Placement:  The leads were then connected to a [facility] MP  model [id number] (serial number  [id number] ) pacemaker.  The pocket was irrigated with copious gentamicin solution.  The pacemaker was then placed into the pocket.  The pocket was then closed in 3 layers with 2.0 Vicryl suture for the 3.0 Vicryl suture subcutaneous and subcuticular layers.  Steri-  Strips and a sterile dressing were then applied. EBL<6m.  There were no early apparent complications.   CONCLUSIONS:  1. Successful [place] of [place] CRT[place]pac[place] for symptomatic bradycardia  2. No early apparent  complications.       Will CCurt Bears MD 07/27/2020 5:33 PM     Medications:     Scheduled Medications: . acetaminophen (TYLENOL) oral liquid 160 mg/5 mL  1,000 mg Oral Q6H   Or  . acetaminophen  1,000 mg Oral Q6H  . aspirin  81 mg Oral Daily  . atorvastatin  80 mg Oral q1800  . bisacodyl  10 mg Oral Daily   Or  . bisacodyl  10 mg Rectal Daily  . Chlorhexidine Gluconate Cloth  6 each Topical Daily  . clopidogrel  75 mg Oral Daily  . docusate  200 mg Oral Daily   Or  . docusate sodium  200 mg Oral Daily  . enoxaparin (LOVENOX) injection  30 mg Subcutaneous QHS  . feeding supplement  237 mL Oral TID BM  . insulin aspart  0-24 Units Subcutaneous TID WC  . insulin aspart  5 Units Subcutaneous TID WC  . insulin detemir  10 Units Subcutaneous Daily  . mouth rinse  15 mL Mouth Rinse BID  . megestrol  40 mg Oral Daily  . melatonin  3 mg Oral QHS  . midodrine  5 mg Oral TID WC  . multivitamin with minerals  1 tablet Oral Daily  . pantoprazole sodium  40 mg Oral Daily   Or  . pantoprazole  40 mg Oral Daily  . polyethylene glycol  17 g Oral Daily  . potassium chloride  20 mEq Oral Daily  . QUEtiapine  50 mg Oral QHS  . sodium chloride flush  3 mL Intravenous Q12H    Infusions:   PRN Medications: acetaminophen, dextrose, guaiFENesin-dextromethorphan, magic mouthwash, metoprolol tartrate, ondansetron (ZOFRAN) IV, oxyCODONE, traMADol   Assessment/Plan:   1. Ischemic Cardiomyopathy, Cardiogenic shock - s/p CABG 3/24 with take back for tamponade on 3/25 - LVEF 55% on post-op TEE 3/25 - Off pressors. Off milrinone - BP still soft, continue midodrine 5 mg three times a day - weight up a bit may need another day of diuretic    2. CAD s/p NSTEMI  - CABG x 5 3/24 - no evidence of ongoing ischemia - continue DAPT, statin. No bb with shock.  3. Acute hypoxic respiratory failure post-op - Stable on room air  4. AKI - baseline creatinine 1.1 -> peaked at 2>1.4-> 1.7->1.7>1.63 -  due to shock/ATN  5. Acute blood loss anemia, post -op - hgb stable at 9  6. Hypernatremia -resolved  7. Thrombocytopenia - Plts trending up 50> 64>79K.-> 107->142k>204  8. Dysphagia Speech following on dysph 2 diet Crush meds with small sips.   9. Deconditioning - PT/OT - CIR  10 CHB - EP consulted.  -S/P CRT-P-- 07/27/20   Length of Stay: 9   Katherine Roan  MD 07/29/2020, 11:59 AM  Advanced Heart Failure Team Pager [phone] (M-F; [place])  Please contact [facility] for night-coverage after hours (4p -7a ) and weekends on [email or web address]  Patient seen and examined with the above-signed Advanced Practice Provider and/or Housestaff. I personally reviewed laboratory data, imaging studies and relevant notes. I independently examined the patient and formulated the important aspects of the plan. I have edited the note to reflect any of my changes or salient points. I have personally discussed the plan with the patient and/or family.  She looks much better today. Remains weak but denies SOB, orthopnea. Pain at pacer site much improved  General:  Sitting up in chair No resp difficulty HEENT: normal Neck: supple. JVP hard to see Carotids 2+ bilat; no bruits. No lymphadenopathy or thryomegaly appreciated. Cor: PMI nondisplaced. Regular rate & rhythm. Sternal wound ok  Lungs: clear Abdomen: soft, nontender, nondistended. No hepatosplenomegaly. No bruits or masses. Good bowel sounds. Extremities: no cyanosis, clubbing, rash, edema Neuro: alert & orientedx3, cranial nerves grossly intact. moves all 4 extremities w/o difficulty. Affect pleasant  She continues to improve. BP soft. Remains on midodrine.  Volume status seems mildly elevated. Will start lasix 40 daily for 3 days. Will get limited echo to reassess EF. Awaiting bed in CIR.   AHF team will see again Monday. Call over w/e with questions.   [name], MD  1:34 PM

## 2020-07-29 NOTE — [place] FL2 (Signed)
[place] MEDICAID FL2 LEVEL OF CARE SCREENING TOOL     IDENTIFICATION  Patient Name: Kristin [name] Birthdate: [birth year] Sex: female Admission Date (Current Location): 07/20/2020  [facility] and [place] Number:  Herbalist and Address:  [facility] [place], [place] [address] [place], [place] [address]      Provider Number: [id number]  Attending Physician Name and Address:  [name], MD  Relative Name and Phone Number:  Unice Cobble Daughter [phone]    Current Level of Care: Hospital Recommended Level of Care: [place] Prior Approval Number:    Date Approved/Denied:   PASRR Number: [id number]  Discharge Plan: SNF    Current Diagnoses: Patient Active Problem List   Diagnosis Date Noted  . S/P placement of cardiac pacemaker 07/28/2020  . S/P CABG x 5 07/28/2020  . MI, acute, non ST segment elevation ([place]) 07/20/2020    Orientation RESPIRATION BLADDER Height & Weight     Self,Time,Situation,Place  Normal Continent,External catheter Weight: 183 lb 8 oz (83.2 kg) Height:  '5\' 6"'$  (167.6 cm)  BEHAVIORAL SYMPTOMS/MOOD NEUROLOGICAL BOWEL NUTRITION STATUS      Continent Diet (see d/c summary)  AMBULATORY STATUS COMMUNICATION OF NEEDS Skin   Limited Assist Verbally Surgical wounds                       Personal Care Assistance Level of Assistance  Bathing,Feeding,Dressing Bathing Assistance: Limited assistance (moderate assistance) Feeding assistance: Limited assistance (Able to feed self but needs help with set up) Dressing Assistance: Limited assistance     Functional Limitations Info  Sight,Hearing,Speech Sight Info: Impaired Hearing Info: Impaired Speech Info: Adequate    SPECIAL CARE FACTORS FREQUENCY  PT (By licensed PT),OT (By licensed OT)     PT Frequency: 5X per week OT Frequency: 5X per week            Contractures Contractures Info: Not present    Additional Factors Info  Code  Status,Allergies,Insulin Sliding Scale Code Status Info: Full Allergies Info: Sulfa Antibiotics   Insulin Sliding Scale Info: see med list       Current Medications (07/29/2020):  This is the current hospital active medication list Current Facility-Administered Medications  Medication Dose Route Frequency Provider Last Rate Last Admin  . acetaminophen (TYLENOL) 160 MG/5ML solution 1,000 mg  1,000 mg Oral Q6H Atkins, Broadus Z, MD   1,000 mg at 07/27/20 0302   Or  . acetaminophen (TYLENOL) tablet 1,000 mg  1,000 mg Oral Q6H [name], MD   1,000 mg at 07/28/20 2126  . acetaminophen (TYLENOL) tablet 325-650 mg  325-650 mg Oral Q4H PRN Camnitz, Will Hassell Done, MD      . aspirin chewable tablet 81 mg  81 mg Oral Daily [name], MD   81 mg at 07/29/20 0849  . atorvastatin (LIPITOR) tablet 80 mg  80 mg Oral q1800 [name], MD   80 mg at 07/29/20 1735  . bisacodyl (DULCOLAX) EC tablet 10 mg  10 mg Oral Daily [name], MD   10 mg at 07/26/20 [id number]   Or  . bisacodyl (DULCOLAX) suppository 10 mg  10 mg Rectal Daily [name], MD      . Chlorhexidine Gluconate Cloth 2 % PADS 6 each  6 each Topical Daily [name], MD   6 each at 07/29/20 0849  . clopidogrel (PLAVIX) tablet 75 mg  75 mg Oral Daily Baldwin [place],  PA-C   75 mg at 07/29/20 0849  . dextrose 50 % solution 0-50 mL  0-50 mL Intravenous PRN Atkins, Glenice Bow, MD      . docusate (COLACE) 50 MG/5ML liquid 200 mg  200 mg Oral Daily Atkins, Glenice Bow, MD       Or  . docusate sodium (COLACE) capsule 200 mg  200 mg Oral Daily Atkins, Broadus Z, MD      . enoxaparin (LOVENOX) injection 30 mg  30 mg Subcutaneous QHS [name], MD   30 mg at 07/28/20 2126  . feeding supplement (ENSURE ENLIVE / ENSURE PLUS) liquid 237 mL  237 mL Oral TID BM Atkins, Glenice Bow, MD   237 mL at 07/29/20 1446  . furosemide (LASIX) tablet 40 mg  40 mg Oral Daily Bensimhon, Shaune Pascal, MD   40 mg at 07/29/20 1446  .  guaiFENesin-dextromethorphan (ROBITUSSIN DM) 100-10 MG/5ML syrup 5 mL  5 mL Oral Q4H PRN [name], MD   5 mL at 07/29/20 1156  . insulin aspart (novoLOG) injection 0-24 Units  0-24 Units Subcutaneous TID WC Barrett, Erin R, PA-C   8 Units at 07/29/20 1736  . insulin aspart (novoLOG) injection 5 Units  5 Units Subcutaneous TID WC Clegg, Amy D, NP   5 Units at 07/29/20 1736  . insulin detemir (LEVEMIR) injection 10 Units  10 Units Subcutaneous Daily [name], MD   10 Units at 07/29/20 0859  . magic mouthwash  15 mL Oral QID PRN Clegg, Amy D, NP   15 mL at 07/29/20 0851  . MEDLINE mouth rinse  15 mL Mouth Rinse BID [name], MD   15 mL at 07/28/20 2130  . megestrol (MEGACE) tablet 40 mg  40 mg Oral Daily [name], MD   40 mg at 07/29/20 0858  . melatonin tablet 3 mg  3 mg Oral QHS [name], MD   3 mg at 07/28/20 2126  . metoprolol tartrate (LOPRESSOR) injection 2.5-5 mg  2.5-5 mg Intravenous Q2H PRN Atkins, Glenice Bow, MD      . midodrine (PROAMATINE) tablet 5 mg  5 mg Oral TID WC Atkins, Glenice Bow, MD   5 mg at 07/29/20 1735  . multivitamin with minerals tablet 1 tablet  1 tablet Oral Daily [name], MD   1 tablet at 07/29/20 0848  . ondansetron (ZOFRAN) injection 4 mg  4 mg Intravenous Q6H PRN Camnitz, Will Hassell Done, MD      . oxyCODONE (Oxy IR/ROXICODONE) immediate release tablet 5-10 mg  5-10 mg Oral Q3H PRN [name], MD      . pantoprazole sodium (PROTONIX) 40 mg/20 mL oral suspension 40 mg  40 mg Oral Daily Atkins, Broadus Z, MD       Or  . pantoprazole (PROTONIX) EC tablet 40 mg  40 mg Oral Daily [name], MD   40 mg at 07/29/20 0849  . polyethylene glycol (MIRALAX / GLYCOLAX) packet 17 g  17 g Oral Daily [name], MD   17 g at 07/26/20 [id number]  . potassium chloride SA (KLOR-CON) CR tablet 20 mEq  20 mEq Oral Daily Barrett, Erin R, PA-C   20 mEq at 07/29/20 0848  . potassium chloride SA (KLOR-CON) CR tablet 20 mEq  20  mEq Oral Once Bensimhon, Shaune Pascal, MD      . QUEtiapine (SEROQUEL) tablet 50 mg  50 mg Oral QHS [name], MD  50 mg at 07/28/20 2126  . sodium chloride flush (NS) 0.9 % injection 3 mL  3 mL Intravenous Q12H [name], MD   3 mL at 07/28/20 2130  . traMADol (ULTRAM) tablet 50-100 mg  50-100 mg Oral Q4H PRN [name], MD   50 mg at 07/28/20 1100     Discharge Medications: Please see discharge summary for a list of discharge medications.  Relevant Imaging Results:  Relevant Lab Results:   Additional Information [phone] COVID vaccination status unsure  Neeya Prigmore, LCSWA

## 2020-07-29 NOTE — Progress Notes (Signed)
Physical Therapy Treatment Patient Details Name: Kristin [name] MRN: [id number] DOB: 11-Oct-1940 Today's Date: 07/29/2020    History of Present Illness Lupe is a 80 y/o female admitted 3/23 from [place] with emergent cath then to ICU on 07/21/20. Pt with NSTEMI and underwent CABGx5 on 07/22/20. Post-op tamponade requiring return to OR on 3/25 for hematoma evacuation and then extubated on 3/26. Underwent BIV pacemaker insertion 3/30. PMH includes DM,HTN, HLD, and history of gallbladder surgery.    PT Comments    Pt received in bed. Mod assist to transition supine to sitting EOB. Mod assist to power up to stand from low surface. Pt ambulated to bathroom first then in hallway 61' with RW min assist +2 safety/chair follow. Cues needed for sternal precautions. Pt in recliner with feet elevated at end of session.    Follow Up Recommendations  CIR;Supervision for mobility/OOB     Equipment Recommendations  Rolling walker with 5" wheels;3in1 (PT)    Recommendations for Other Services       Precautions / Restrictions Precautions Precautions: Sternal;ICD/Pacemaker Precaution Comments: pacemaker placement 3/30    Mobility  Bed Mobility Overal bed mobility: Needs Assistance Bed Mobility: Supine to Sit     Supine to sit: Mod assist;HOB elevated     General bed mobility comments: assist to maintain sternal precautions    Transfers Overall transfer level: Needs assistance Equipment used: Rolling walker (2 wheeled) Transfers: Sit to/from Stand Sit to Stand: Mod assist         General transfer comment: cues for sternal precautions, assist to power up and stabilize balance  Ambulation/Gait Ambulation/Gait assistance: Min assist;+2 safety/equipment Gait Distance (Feet): 65 Feet Assistive device: Rolling walker (2 wheeled) Gait Pattern/deviations: Step-through pattern Gait velocity: decreased Gait velocity interpretation: <1.31 ft/sec, indicative of household ambulator General  Gait Details: cues for posture and to stay close to RW. Mildly unsteady with min assist to maintain balance.   Stairs             Wheelchair Mobility    Modified Rankin (Stroke Patients Only)       Balance Overall balance assessment: Needs assistance Sitting-balance support: Feet supported;No upper extremity supported Sitting balance-Leahy Scale: Fair     Standing balance support: Bilateral upper extremity supported;During functional activity Standing balance-Leahy Scale: Poor Standing balance comment: Reliant on B UE support and external assistance                            Cognition Arousal/Alertness: Awake/alert Behavior During Therapy: WFL for tasks assessed/performed Overall Cognitive Status: Within Functional Limits for tasks assessed                                 General Comments: mildly anxious regarding mobility      Exercises      General Comments General comments (skin integrity, edema, etc.): VSS on RA, no overt dyspnea      Pertinent Vitals/Pain Pain Assessment: Faces Faces Pain Scale: Hurts a little bit Pain Location: LUE Pain Descriptors / Indicators: Discomfort Pain Intervention(s): Monitored during session    Home Living                      Prior Function            PT Goals (current goals can now be found in the care plan section) Acute Rehab PT Goals Patient  Stated Goal: get stronger Progress towards PT goals: Progressing toward goals    Frequency    Min 3X/week      PT Plan Current plan remains appropriate    Co-evaluation              AM-PAC PT "6 Clicks" Mobility   Outcome Measure  Help needed turning from your back to your side while in a flat bed without using bedrails?: A Little Help needed moving from lying on your back to sitting on the side of a flat bed without using bedrails?: A Lot Help needed moving to and from a bed to a chair (including a wheelchair)?: A  Little Help needed standing up from a chair using your arms (e.g., wheelchair or bedside chair)?: A Lot Help needed to walk in hospital room?: A Little Help needed climbing 3-5 steps with a railing? : A Lot 6 Click Score: 15    End of Session Equipment Utilized During Treatment: Gait belt Activity Tolerance: Patient tolerated treatment well Patient left: in chair;with call bell/phone within reach Nurse Communication: Mobility status PT Visit Diagnosis: Unsteadiness on feet (R26.81);Muscle weakness (generalized) (M62.81);Difficulty in walking, not elsewhere classified (R26.2)     Time: [id number] PT Time Calculation (min) (ACUTE ONLY): 31 min  Charges:  $Gait Training: 23-37 mins                     Lorrin Goodell, [place]  Office # [phone] Pager [phone]    Lorriane Shire 07/29/2020, 12:26 PM

## 2020-07-29 NOTE — H&P (Incomplete)
Physical Medicine and Rehabilitation Admission H&P     HPI: Kristin [name] is a 80 year old right-handed female with history of hypertension, diabetes mellitus, obesity with BMI 29.62, hyperlipidemia.  Per chart review patient lives alone.  1 level home 4 steps to entry.  Reportedly independent prior to admission but somewhat sedentary.  Presented 07/20/2020 upon transfer from [facility] with retrosternal and left-sided chest discomfort radiating to the left arm.  Patient had noted increasing shortness of breath with exertion over the last few months and fatigue EKG in the ED showed normal sinus rhythm with left bundle branch block.  Patient was noted to have elevated troponin I 0.92 started on IV heparin received aspirin therapy in the ED.  Echocardiogram with ejection fraction of 40 to [id number] grade 1 diastolic dysfunction.  Cardiac catheterization showed severe three-vessel CAD including thrombotic distal LM CAD.  Patient underwent CABG x5 07/21/2020 per [name].  Postoperative cardiogenic shock and patient remained intubated.  Patient underwent repeat transesophageal echocardiogram showing ejection fraction 40 to 45% the LV marked LVH and underfilled.  External compression of the LV from a fluid collection posterior and lateral to the heart.  She underwent mediastinal exploration for cardiac tamponade 07/23/2027.  Patient was extubated 07/23/2020.  Maintained on ProAmatine to sustain blood pressure..  New onset atrial flutter follow-up cardiology services requiring pacemaker 07/27/2020.  Maintained on low-dose aspirin and Plavix after CABG.  Lovenox initiated for DVT prophylaxis.  She is on a dysphagia #2 thin liquid diet.  Patient did have mild restlessness agitation placed on low-dose Seroquel.  Acute blood loss anemia 9.1 and monitored.  AKI on CKD creatinine at admission 07/21/2020 1.57-1.63.  Dysphagia #2 thin liquid diet.  Therapy evaluations completed due to patient decreased functional  mobility she was admitted for a comprehensive rehab program.  Review of Systems  Constitutional: Positive for malaise/fatigue. Negative for chills and fever.  HENT: Negative for hearing loss.   Eyes: Negative for blurred vision and double vision.  Respiratory: Positive for shortness of breath. Negative for cough.   Cardiovascular: Positive for chest pain, palpitations and leg swelling.  Gastrointestinal: Positive for constipation. Negative for heartburn, nausea and vomiting.  Genitourinary: Negative for flank pain and hematuria.  Musculoskeletal: Positive for joint pain and myalgias.  Skin: Negative for rash.  Neurological: Positive for headaches.  Psychiatric/Behavioral: Positive for depression. The patient has insomnia.   All other systems reviewed and are negative.  Past Medical History:  Diagnosis Date  . Depression   . Diabetes mellitus without complication ([place])   . Hypertension   . Night sweats    Past Surgical History:  Procedure Laterality Date  . BIV PACEMAKER INSERTION CRT-P N/A 07/27/2020   Procedure: BIV PACEMAKER INSERTION CRT-P;  Surgeon: Constance Haw, MD;  Location: [place] [facility];  Service: Cardiovascular;  Laterality: N/A;  . CORONARY ARTERY BYPASS GRAFT N/A 07/21/2020   Procedure: CORONARY ARTERY BYPASS GRAFTING (CABG), TIMES FIVE, USING LEFT INTERNAL MAMMARY ARTERY AND ENDOSCOPICALLY HARVESTED BILATERAL GREATER SAPHENOUS VEINS;  Surgeon: [name], MD;  Location: [place];  Service: Open Heart Surgery;  Laterality: N/A;  . ENDOVEIN HARVEST OF GREATER SAPHENOUS VEIN N/A 07/21/2020   Procedure: ENDOVEIN HARVEST OF BILATERAL GREATER SAPHENOUS VEINS;  Surgeon: [name], MD;  Location: [place];  Service: Open Heart Surgery;  Laterality: N/A;  . EYE SURGERY    . GALLBLADDER SURGERY    . IABP INSERTION N/A 07/21/2020   Procedure: IABP Insertion;  Surgeon: Terrence Dupont,  Prudencio Burly, MD;  Location: [place] [facility];  Service: Cardiovascular;  Laterality: N/A;   . LEFT HEART CATH AND CORONARY ANGIOGRAPHY N/A 07/21/2020   Procedure: LEFT HEART CATH AND CORONARY ANGIOGRAPHY;  Surgeon: Charolette Forward, MD;  Location: [place] [facility];  Service: Cardiovascular;  Laterality: N/A;  . MEDIASTINAL EXPLORATION N/A 07/22/2020   Procedure: MEDIASTINAL EXPLORATION;  Surgeon: [name], MD;  Location: [facility];  Service: Thoracic;  Laterality: N/A;  . TEE WITHOUT CARDIOVERSION N/A 07/21/2020   Procedure: TRANSESOPHAGEAL ECHOCARDIOGRAM (TEE);  Surgeon: [name], MD;  Location: [place];  Service: Open Heart Surgery;  Laterality: N/A;  . TEE WITHOUT CARDIOVERSION N/A 07/22/2020   Procedure: TRANSESOPHAGEAL ECHOCARDIOGRAM (TEE);  Surgeon: [name], MD;  Location[facility];  Service: Thoracic;  Laterality: N/A;  . toenail surgery     both big toenails   Family History  Problem Relation Age of Onset  . Diabetes Mother   . Heart attack Mother   . Heart attack Father    Social History:  reports that she has never smoked. She has never used smokeless tobacco. She reports that she does not drink alcohol and does not use drugs. Allergies:  Allergies  Allergen Reactions  . Sulfa Antibiotics Hives   Medications Prior to Admission  Medication Sig Dispense Refill  . albuterol (VENTOLIN HFA) 108 (90 Base) MCG/ACT inhaler Inhale 2 puffs into the lungs every 6 (six) hours as needed for wheezing or shortness of breath.    . Alcohol Swabs (ALCOHOL PREP) 70 % PADS     . amLODipine (NORVASC) 5 MG tablet Take 5 mg by mouth daily.    . ASSURE COMFORT LANCETS 30G MISC     . b complex vitamins tablet Take 1 tablet by mouth daily.    . Blood Glucose Monitoring Suppl (EMBRACE BLOOD GLUCOSE MONITOR) DEVI     . buPROPion (WELLBUTRIN XL) 150 MG 24 hr tablet     . cholecalciferol (VITAMIN D) 1000 UNITS tablet Take 1,000 Units by mouth daily.    . COMFORT EZ PEN NEEDLES 31G X 8 MM MISC     . EMBRACE BLOOD GLUCOSE TEST test strip     . glimepiride (AMARYL) 1 MG tablet  Take 1 mg by mouth every morning.    Marland Kitchen LEVEMIR FLEXTOUCH 100 UNIT/ML FlexPen Inject 42 Units into the skin daily.    . Multiple Vitamins-Minerals (ZINC PO) Take 1 tablet by mouth daily.    . pioglitazone (ACTOS) 30 MG tablet Take 30 mg by mouth daily.    . rosuvastatin (CRESTOR) 40 MG tablet Take 40 mg by mouth daily.    Marland Kitchen topiramate (TOPAMAX) 50 MG tablet Take 50 mg by mouth daily.    Marland Kitchen trimethoprim (TRIMPEX) 100 MG tablet Take 100 mg by mouth daily.      Drug Regimen Review Drug regimen was reviewed and remains appropriate with no significant issues identified  Home: Home Living Family/patient expects to be discharged to:: Private residence Living Arrangements: Alone Available Help at Discharge: Family,Available PRN/intermittently Type of Home: House Home Access: Stairs to enter [employer] of Steps: 4 Entrance Stairs-Rails: Can reach both Home Layout: One level Bathroom Shower/Tub: Chiropodist: Standard Bathroom Accessibility: Yes Home Equipment: Walker - 2 wheels,Cane - single point,Shower seat   Functional History: Prior Function Level of Independence: Independent Comments: Able to walk within home/short distances  Functional Status:  Mobility: Bed Mobility Overal bed mobility: Needs Assistance Bed Mobility: Supine to Sit  Supine to sit: Mod assist Sit to supine: Mod assist General bed mobility comments: cues for sequencing, pt bridged to EOB without assist.  Pt needed truncal assist due to unable to build enough momentum to come up without UE's. Transfers Overall transfer level: Needs assistance Equipment used: Rolling walker (2 wheeled) Transfers: Sit to/from Stand Sit to Stand: Min assist,Mod assist (depending on surface height) Stand pivot transfers: Min assist,+2 physical assistance General transfer comment: cues for sternal precautions/where to put hands.  assist forward and boosting,. Ambulation/Gait Ambulation/Gait assistance:  Min assist Gait Distance (Feet): 22 Feet (x2 with RW) Assistive device: Rolling walker (2 wheeled) Gait Pattern/deviations: Step-through pattern General Gait Details: cues for better posture and proximity to the RW.  Min stability assist at times, help manevering the RW in close quarters.  mildly unsteady/guarded. Gait velocity interpretation: <1.8 ft/sec, indicate of risk for recurrent falls    ADL: ADL Overall ADL's : Needs assistance/impaired Eating/Feeding: Set up,Sitting Grooming: Wash/dry face,Set up,Sitting Upper Body Bathing: Moderate assistance Lower Body Bathing: Maximal assistance Upper Body Dressing : Maximal assistance,Cueing for compensatory techniques Lower Body Dressing: Maximal assistance,Sit to/from stand Toilet Transfer: Minimal assistance,+2 for physical assistance,+2 for safety/equipment Toilet Transfer Details (indicate cue type and reason): cues for safe hand placement Toileting- Clothing Manipulation and Hygiene: Total assistance Functional mobility during ADLs: Minimal assistance,+2 for physical assistance,+2 for safety/equipment,Cueing for safety,Cueing for sequencing,Rolling walker General ADL Comments: needs continued education for sternal precautions with ADL focus  Cognition: Cognition Overall Cognitive Status: Within Functional Limits for tasks assessed Orientation Level: Oriented X4 Cognition Arousal/Alertness: Awake/alert Behavior During Therapy: WFL for tasks assessed/performed Overall Cognitive Status: Within Functional Limits for tasks assessed General Comments: A&Ox4. Follows commands consistently with increased time  Physical Exam: Blood pressure (!) 125/55, pulse 84, temperature 98.9 F (37.2 C), temperature source Oral, resp. rate 18, height '5\' 6"'$  (1.676 m), weight 83.2 kg, SpO2 90 %. Physical Exam Skin:    Comments: Tegaderm in place to pacemaker site.  Neurological:     Comments: Patient is alert.  Complaints of fatigue.  Oriented to  person place.  Follows commands.     Results for orders placed or performed during the hospital encounter of 07/20/20 (from the past 48 hour(s))  Glucose, capillary     Status: Abnormal   Collection Time: 07/27/20  7:39 AM  Result Value Ref Range   Glucose-Capillary 152 (H) 70 - 99 mg/dL    Comment: Glucose reference range applies only to samples taken after fasting for at least 8 hours.  Glucose, capillary     Status: Abnormal   Collection Time: 07/27/20 11:16 AM  Result Value Ref Range   Glucose-Capillary 213 (H) 70 - 99 mg/dL    Comment: Glucose reference range applies only to samples taken after fasting for at least 8 hours.  Glucose, capillary     Status: Abnormal   Collection Time: 07/27/20  6:19 PM  Result Value Ref Range   Glucose-Capillary 180 (H) 70 - 99 mg/dL    Comment: Glucose reference range applies only to samples taken after fasting for at least 8 hours.  Glucose, capillary     Status: Abnormal   Collection Time: 07/27/20  9:16 PM  Result Value Ref Range   Glucose-Capillary 272 (H) 70 - 99 mg/dL    Comment: Glucose reference range applies only to samples taken after fasting for at least 8 hours.  Glucose, capillary     Status: Abnormal   Collection Time: 07/28/20 12:04 AM  Result  Value Ref Range   Glucose-Capillary 209 (H) 70 - 99 mg/dL    Comment: Glucose reference range applies only to samples taken after fasting for at least 8 hours.   Comment 1 Document in Chart   Magnesium     Status: None   Collection Time: 07/28/20 12:59 AM  Result Value Ref Range   Magnesium 2.0 1.7 - 2.4 mg/dL    Comment: Performed at [facility], [address], [place], [place] [id number]  Basic metabolic panel     Status: Abnormal   Collection Time: 07/28/20 12:59 AM  Result Value Ref Range   Sodium 142 135 - 145 mmol/L   Potassium 3.9 3.5 - 5.1 mmol/L   Chloride 105 98 - 111 mmol/L   CO2 31 22 - 32 mmol/L   Glucose, Bld 187 (H) 70 - 99 mg/dL    Comment: Glucose reference  range applies only to samples taken after fasting for at least 8 hours.   BUN 33 (H) 8 - 23 mg/dL   Creatinine, Ser 1.72 (H) 0.44 - 1.00 mg/dL   Calcium 8.1 (L) 8.9 - 10.3 mg/dL   GFR, Estimated 30 (L) >60 mL/min    Comment: (NOTE) Calculated using the CKD-EPI Creatinine Equation (2021)    Anion gap 6 5 - 15    Comment: Performed at [facility] [address], [place], [place] [address]  CBC     Status: Abnormal   Collection Time: 07/28/20 12:59 AM  Result Value Ref Range   WBC 7.7 4.0 - 10.5 K/uL   RBC 3.03 (L) 3.87 - 5.11 MIL/uL   Hemoglobin 9.4 (L) 12.0 - 15.0 g/dL   HCT 30.1 (L) 36.0 - 46.0 %   MCV 99.3 80.0 - 100.0 fL   MCH 31.0 26.0 - 34.0 pg   MCHC 31.2 30.0 - 36.0 g/dL   RDW 17.6 (H) 11.5 - 15.5 %   Platelets 142 (L) 150 - 400 K/uL   nRBC 2.2 (H) 0.0 - 0.2 %    Comment: Performed at [facility] [address] [place], [place] [address]  Glucose, capillary     Status: Abnormal   Collection Time: 07/28/20  3:23 AM  Result Value Ref Range   Glucose-Capillary 146 (H) 70 - 99 mg/dL    Comment: Glucose reference range applies only to samples taken after fasting for at least 8 hours.  Glucose, capillary     Status: Abnormal   Collection Time: 07/28/20  7:58 AM  Result Value Ref Range   Glucose-Capillary 181 (H) 70 - 99 mg/dL    Comment: Glucose reference range applies only to samples taken after fasting for at least 8 hours.  Glucose, capillary     Status: Abnormal   Collection Time: 07/28/20 12:08 PM  Result Value Ref Range   Glucose-Capillary 311 (H) 70 - 99 mg/dL    Comment: Glucose reference range applies only to samples taken after fasting for at least 8 hours.  Glucose, capillary     Status: Abnormal   Collection Time: 07/28/20  4:36 PM  Result Value Ref Range   Glucose-Capillary 227 (H) 70 - 99 mg/dL    Comment: Glucose reference range applies only to samples taken after fasting for at least 8 hours.  Glucose, capillary     Status: Abnormal    Collection Time: 07/28/20  8:08 PM  Result Value Ref Range   Glucose-Capillary 195 (H) 70 - 99 mg/dL    Comment:  Glucose reference range applies only to samples taken after fasting for at least 8 hours.   Comment 1 Notify RN    Comment 2 Document in Chart   Glucose, capillary     Status: Abnormal   Collection Time: 07/28/20 11:42 PM  Result Value Ref Range   Glucose-Capillary 156 (H) 70 - 99 mg/dL    Comment: Glucose reference range applies only to samples taken after fasting for at least 8 hours.   Comment 1 Notify RN    Comment 2 Document in Chart   Magnesium     Status: None   Collection Time: 07/29/20  1:54 AM  Result Value Ref Range   Magnesium 1.9 1.7 - 2.4 mg/dL    Comment: Performed at [facility], [place] [address], [place], [place] [id number]  Basic metabolic panel     Status: Abnormal   Collection Time: 07/29/20  1:54 AM  Result Value Ref Range   Sodium 140 135 - 145 mmol/L   Potassium 3.6 3.5 - 5.1 mmol/L   Chloride 102 98 - 111 mmol/L   CO2 30 22 - 32 mmol/L   Glucose, Bld 144 (H) 70 - 99 mg/dL    Comment: Glucose reference range applies only to samples taken after fasting for at least 8 hours.   BUN 30 (H) 8 - 23 mg/dL   Creatinine, Ser 1.63 (H) 0.44 - 1.00 mg/dL   Calcium 8.1 (L) 8.9 - 10.3 mg/dL   GFR, Estimated 32 (L) >60 mL/min    Comment: (NOTE) Calculated using the CKD-EPI Creatinine Equation (2021)    Anion gap 8 5 - 15    Comment: Performed at [facility] [address], [place], [place] [address]  CBC     Status: Abnormal   Collection Time: 07/29/20  1:54 AM  Result Value Ref Range   WBC 8.4 4.0 - 10.5 K/uL   RBC 2.86 (L) 3.87 - 5.11 MIL/uL   Hemoglobin 9.1 (L) 12.0 - 15.0 g/dL   HCT 28.8 (L) 36.0 - 46.0 %   MCV 100.7 (H) 80.0 - 100.0 fL   MCH 31.8 26.0 - 34.0 pg   MCHC 31.6 30.0 - 36.0 g/dL   RDW 17.6 (H) 11.5 - 15.5 %   Platelets 204 150 - 400 K/uL   nRBC 1.5 (H) 0.0 - 0.2 %    Comment: Performed at [facility]  [address], [place], [place] [address]  Glucose, capillary     Status: Abnormal   Collection Time: 07/29/20  4:02 AM  Result Value Ref Range   Glucose-Capillary 148 (H) 70 - 99 mg/dL    Comment: Glucose reference range applies only to samples taken after fasting for at least 8 hours.   Comment 1 Notify RN    Comment 2 Document in Chart    DG Chest 1 View  Result Date: 07/27/2020 CLINICAL DATA:  Shortness of breath, pre pacemaker placement EXAM: CHEST  1 VIEW COMPARISON:  Portable exam 0810 hours compared to 07/26/2020 FINDINGS: Borderline enlargement of cardiac silhouette post median sternotomy. Mediastinal contours and pulmonary vascularity normal. Bibasilar atelectasis and small pleural effusions. No segmental consolidation or pneumothorax. Osseous demineralization. IMPRESSION: Small bibasilar pleural effusions and atelectasis. Electronically Signed   By: Lavonia Dana M.D.   On: 07/27/2020 08:31   DG Chest 2 View  Result Date: 07/28/2020 CLINICAL DATA:  Cardiac device in situ. EXAM: CHEST - 2 VIEW COMPARISON:  07/27/2020. FINDINGS: AICD noted with lead  tips in good anatomic position. Prior CABG. Cardiomegaly. Bilateral mild interstitial prominence suggesting interstitial edema. Small bilateral pleural effusions. Findings consistent with CHF. No pneumothorax. IMPRESSION: 1. AICD noted with lead tips in good anatomic position. No pneumothorax. 2. Prior CABG. Bilateral interstitial prominence and small bilateral pleural effusions. Findings consistent with CHF. Electronically Signed   By: Marcello Moores  Register   On: 07/28/2020 06:30   EP PPM/ICD IMPLANT  Result Date: 07/27/2020 SURGEON:  Allegra Lai, MD   PREPROCEDURE DIAGNOSIS:  Complete AV block   POSTPROCEDURE DIAGNOSIS:  Complete AV block    PROCEDURES:  1. Pacemaker implantation.   INTRODUCTION:  TITANNA AUGSPURGER is a 80 y.o. female with a history of bradycardia who presents today for pacemaker implantation.  The patient reports intermittent episodes of  dizziness over the past few months.  No reversible causes have been identified.  The patient therefore presents today for pacemaker implantation.   DESCRIPTION OF PROCEDURE:  Informed written consent was obtained, and  the patient was brought to the electrophysiology lab in a fasting state.  The patient required no sedation for the procedure today.  The patients left chest was prepped and draped in the usual sterile fashion by the EP lab staff. The skin overlying the left deltopectoral region was infiltrated with lidocaine for local analgesia.  A 4-cm incision was made over the left deltopectoral region.  A left subcutaneous pacemaker pocket was fashioned using a combination of sharp and blunt dissection. Electrocautery was required to assure hemostasis.  Left Upper Extremity Venography: A venogram of the left upper extremity was performed, which revealed a large left cephalic vein, which emptied into a large left subclavian vein.  The left axillary vein was moderate in size.  RA/RV Lead Placement: The left axillary vein was therefore cannulated.  Through the left axillary vein, a [employer] model Tendril MRI [id number] (serial number  [id number]) right atrial lead and a [employer] model Tendril MRI [id number] (serial number  [id number]) right ventricular lead were advanced with fluoroscopic visualization into the right atrial appendage and right ventricular apex positions respectively.  Initial atrial lead P- waves measured 1.5 mV with impedance of 463 ohms and a threshold of 0.5 V at 0.5 msec.  Right ventricular lead R-waves measured 14.2 mV with an impedance of 649 ohms and a threshold of 0.7 V at 0.5 msec.  Both leads were secured to the pectoralis fascia using #2-0 silk over the suture sleeves. LV Lead Placement: A [employer]. Jude 135 guide was advanced through the left axillary vein into the low lateral right atrium. A Versicore wire was introduced through the guide and used to cannulate the coronary sinus.  Coronary sinus cannulation was confirmed with electrogram recording from the hexapolar catheter. A selective coronary sinus venogram was performed by hand injection of nonionic contrast. This demonstrated a large CS body with very small/ atretic distal branches. There was a moderate sized lateral coronary sinus branch was noted along the mid portion of the CS body. No other posterior branches were identified. A Whisper CSJ wire was introduced through the guide and advanced into the distal posterolateral branch. Cobb [id number] (serial number [id number] ) lead was advanced through the MB-2 into the lateral branch. This was approximately one-thirds from the base to the apex in a very lateral position. In this location, the left ventricular lead R-waves measured 3.4 mV with impedance of 615 ohms and a threshold of 1.2 volt at 0.5 milliseconds in the bipolar LV1-LV2 configuration  with no diaphragmatic stimulation observed when pacing at 10 volts output. The MB-2 guide was therefore removed. All three leads were secured to the pectoralis fascia using #2 silk suture over the suture sleeves. The pocket then irrigated with copious gentamicin solution. Device Placement:  The leads were then connected to a [facility] MP  model [id number] (serial number  [id number] ) pacemaker.  The pocket was irrigated with copious gentamicin solution.  The pacemaker was then placed into the pocket.  The pocket was then closed in 3 layers with 2.0 Vicryl suture for the 3.0 Vicryl suture subcutaneous and subcuticular layers.  Steri-  Strips and a sterile dressing were then applied. EBL<42m.  There were no early apparent complications.   CONCLUSIONS:  1. Successful implantation of a SLargoMP CRT-pacemaker for symptomatic bradycardia  2. No early apparent complications.       WAllegra Lai MD 07/27/2020 5:33 PM       Medical Problem List and Plan: 1.  Debility  secondary to CAD status post CABG  [id number] by cardiogenic shock/ tamponade status post mediastinal reexploration 07/22/2020.  Sternal precautions  -patient may *** shower  -ELOS/Goals: *** 2.  Antithrombotics: -DVT/anticoagulation: Lovenox  -antiplatelet therapy: Aspirin 81 mg daily, Plavix 75 mg daily 3. Pain Management: Oxycodone/tramadol as needed 4. Mood: Melatonin 3 mg nightly  -antipsychotic agents: Seroquel 50 mg nightly 5. Neuropsych: This patient is capable of making decisions on her own behalf. 6. Skin/Wound Care: Routine skin checks 7. Fluids/Electrolytes/Nutrition: Routine in and outs with follow-up chemistries 8.  New onset atrial flutter.  Status post pacemaker 07/27/2020.  Follow-up cardiology services 9.  Acute blood loss anemia.  Follow-up CBC 10.  Cardiogenic shock postop tamponade.  Heart failure team following.  Currently on ProAmatine. 11.  AKI on CKD.  Follow-up chemistries 12.  Diabetes mellitus.  Hemoglobin A1c 8.8.  NovoLog 5 units 3 times daily, Levemir 10 units daily.  Check blood sugars before meals and at bedtime 13.  Hyperlipidemia.  Lipitor 14.  Obesity.  BMI 29.62.  Dietary follow-up  ***  DCathlyn Parsons PA-C 07/29/2020

## 2020-07-29 NOTE — Progress Notes (Signed)
Inpatient Rehab Admissions Coordinator:  Saw pt at bedside. Informed pt that insurance denied request for CIR.  TOC made aware.  AC will sign off.   Gayland Curry, [place], [facility] Admissions Coordinator [phone]

## 2020-07-29 NOTE — Progress Notes (Signed)
Occupational Therapy Treatment Patient Details Name: Kristin [name] MRN: [id number] DOB: April 13, 1941 Today's Date: 07/29/2020    History of present illness Kristin [name] is a 80 y/o female admitted 3/23 from [place] with emergent cath then to ICU on 07/21/20. Pt with NSTEMI and underwent CABGx5 on 07/22/20. Post-op tamponade requiring return to OR on 3/25 for hematoma evacuation and then extubated on 3/26. Underwent BIV pacemaker insertion 3/30. PMH includes DM,HTN, HLD, and history of gallbladder surgery.   OT comments  Patient with incremental gains towards patient stated goals.  Patient expresses increased discomfort to LUE after pacemaker placement 3/30.  Reviewed sternal and pacemaker precautions.  Performed UB HEP: Bil fist pumps, wrist flex/ext, forearm sup/pro, elbow flex/ext and supine chest press AA while maintaining shoulder flexion below 90 degrees.  Patient limiting mobility to edge of bed, just given lasix and pure wick placed.  Patient understanding mobility with tech upcoming.  Patient describes being denied CIR, and now looking toward SNF near home.  SNF is needed to maximize functional status to ensure a safe transition home, where she lives alone.  OT to continue in the acute setting.    Follow Up Recommendations  SNF    Equipment Recommendations  3 in 1 bedside commode    Recommendations for Other Services      Precautions / Restrictions Precautions Precautions: Sternal;ICD/Pacemaker Precaution Comments: pacemaker placement 3/30       Mobility Bed Mobility Overal bed mobility: Needs Assistance Bed Mobility: Supine to Sit;Sit to Supine     Supine to sit: Mod assist Sit to supine: Mod assist   General bed mobility comments: VC's for sternal precautions.    Transfers Overall transfer level: Needs assistance Equipment used: Rolling walker (2 wheeled) Transfers: Sit to/from Stand Sit to Stand: Mod assist         General transfer comment: cues for sternal  precautions, assist to power up and stabilize balance    Balance Overall balance assessment: Needs assistance Sitting-balance support: Feet supported;No upper extremity supported Sitting balance-Leahy Scale: Fair     Standing balance support: Bilateral upper extremity supported;During functional activity Standing balance-Leahy Scale: Poor Standing balance comment: Reliant on B UE support and external assistance                           ADL either performed or assessed with clinical judgement   ADL Overall ADL's : Needs assistance/impaired     Grooming: Wash/dry face;Set up;Sitting           Upper Body Dressing : Maximal assistance;Cueing for compensatory techniques;Sitting   Lower Body Dressing: Maximal assistance;Sit to/from stand                       Vision Patient Visual Report: No change from baseline                Cognition Arousal/Alertness: Awake/alert Behavior During Therapy: WFL for tasks assessed/performed Overall Cognitive Status: Within Functional Limits for tasks assessed                                 General Comments: mildly anxious regarding mobility                    General Comments VSS on RA, no overt dyspnea    Pertinent Vitals/ Pain       Pain Assessment: Faces Faces  Pain Scale: Hurts even more Pain Location: LUE Pain Descriptors / Indicators: Discomfort;Guarding;Grimacing Pain Intervention(s): Limited activity within patient's tolerance                                                          Frequency  Min 2X/week        Progress Toward Goals  OT Goals(current goals can now be found in the care plan section)  Progress towards OT goals: Progressing toward goals  Acute Rehab OT Goals Patient Stated Goal: move a little better so I can go home OT Goal Formulation: With patient Time For Goal Achievement: 08/09/20 Potential to Achieve Goals: Good  Plan  Discharge plan needs to be updated    Co-evaluation                 AM-PAC OT "6 Clicks" Daily Activity     Outcome Measure   Help from another person eating meals?: A Little Help from another person taking care of personal grooming?: A Little Help from another person toileting, which includes using toliet, bedpan, or urinal?: A Lot Help from another person bathing (including washing, rinsing, drying)?: A Lot Help from another person to put on and taking off regular upper body clothing?: A Lot Help from another person to put on and taking off regular lower body clothing?: A Lot 6 Click Score: 14    End of Session    OT Visit Diagnosis: Unsteadiness on feet (R26.81);Other abnormalities of gait and mobility (R26.89);Muscle weakness (generalized) (M62.81)   Activity Tolerance Patient limited by pain   Patient Left in bed;with call bell/phone within reach;with nursing/sitter in room   Nurse Communication          Time: [phone] OT Time Calculation (min): 19 min  Charges: OT General Charges $OT Visit: 1 Visit OT Treatments $Self Care/Home Management : 8-22 mins  07/29/2020  Rich, OTR/L  Acute Rehabilitation Services  Office:  [place] 07/29/2020, 3:34 PM

## 2020-07-29 NOTE — Progress Notes (Signed)
CARDIAC REHAB PHASE I   Offered to walk with pt multiple times this afternoon. Pt declines. States she will walk later. Stressed importance of ambulation. Will continue to follow.  [name], RN BSN 07/29/2020 3:13 PM

## 2020-07-29 NOTE — Progress Notes (Signed)
      [address]       [place] [address]             [phone]      2 Days Post-Op Procedure(s) (LRB): BIV PACEMAKER INSERTION CRT-P (N/A)   Subjective:  Patient feeling better today.  She continues to feel weak.  Objective: Vital signs in last 24 hours: Temp:  [97.8 F (36.6 C)-98.9 F (37.2 C)] 98.5 F (36.9 C) (04/01 0741) Pulse Rate:  [35-126] 79 (04/01 0741) Cardiac Rhythm: Bundle branch block;Heart block (03/31 2355) Resp:  [16-20] 20 (04/01 0741) BP: (101-131)/(45-75) 109/49 (04/01 0741) SpO2:  [44 %-100 %] 96 % (04/01 0741) Weight:  [79.4 kg-83.2 kg] 83.2 kg (04/01 0400)  Intake/Output from previous day: 03/31 0701 - 04/01 0700 In: 530 [P.O.:480; IV Piggyback:50] Out: 600 [Urine:600]  General appearance: alert, cooperative and no distress Heart: regular rate and rhythm Lungs: clear to auscultation bilaterally Abdomen: soft, non-tender; bowel sounds normal; no masses,  no organomegaly Extremities: edema trace Wound: clean and dry  Lab Results: Recent Labs    07/28/20 0059 07/29/20 0154  WBC 7.7 8.4  HGB 9.4* 9.1*  HCT 30.1* 28.8*  PLT 142* 204   BMET:  Recent Labs    07/28/20 0059 07/29/20 0154  NA 142 140  K 3.9 3.6  CL 105 102  CO2 31 30  GLUCOSE 187* 144*  BUN 33* 30*  CREATININE 1.72* 1.63*  CALCIUM 8.1* 8.1*    PT/INR: No results for input(s): LABPROT, INR in the last 72 hours. ABG    Component Value Date/Time   PHART 7.442 07/23/2020 0434   HCO3 29.2 (H) 07/23/2020 0434   TCO2 31 07/23/2020 0434   ACIDBASEDEF 1.0 07/22/2020 0814   O2SAT 59.9 07/26/2020 0357   CBG (last 3)  Recent Labs    07/28/20 2008 07/28/20 2342 07/29/20 0402  GLUCAP 195* 156* 148*    Assessment/Plan: S/P Procedure(s) (LRB): BIV PACEMAKER INSERTION CRT-P (N/A)  1. CV- Paced, BP remains labile 100-130- remains on Midodrine 5 mg TID, can hopefully d/c soon 2. Pulm- no acute issues, continue IS 3. Renal- creatinine remains  stable, weight remains elevated, continue Lasix, potassium 4. DM- sugars remain elevated at times, continue current insulin regimen, will add to AC/HS 6. Dysphagia- continue current diet, advancement per SLP evaluation 7. Deconditioning-continue PT/OT, needs CIR, awaiting insurance authorization 8. Dispo- patient stable, continue current care, ready for d/c to CIR once insurance authorization is obtained and bed becomes available   LOS: 9 days    Ellwood Handler, PA-C 07/29/2020

## 2020-07-29 NOTE — Progress Notes (Signed)
Mobility Specialist: Progress Note   07/29/20 1721  Mobility  Activity Ambulated in room  Level of Assistance Moderate assist, patient does 50-74%  Assistive Device Front wheel walker  Distance Ambulated (ft) 28 ft  Mobility Response Tolerated fair  Mobility performed by Mobility specialist  $Mobility charge 1 Mobility   Pre-Mobility: 73 HR, 109/54 BP, 93% SpO2 Post-Mobility: 92 HR, 123/56 BP, 97% SpO2  Pt very anxious before mobility. Pt's monitor, on two separate occasions, before getting up displayed asystole and then pt bradycardic with HR 54-56 bpm. Pt asx throughout, RN notified. Pt was minA to sit EOB from supine and was modA to stand from EOB. Pt took 3 attempts to stand. Just before standing pt bradycardic a third time with HR still 54-56 bpm, each time lasting a few seconds and then pt's HR returned to high 70s to low 80s bpm. Pt back to bed after walk with call bell at her side.   [facility] Agatha Duplechain Mobility Specialist Mobility Specialist Phone: [phone]

## 2020-07-30 LAB — GLUCOSE, CAPILLARY
Glucose-Capillary: 183 mg/dL — ABNORMAL HIGH (ref 70–99)
Glucose-Capillary: 242 mg/dL — ABNORMAL HIGH (ref 70–99)
Glucose-Capillary: 244 mg/dL — ABNORMAL HIGH (ref 70–99)
Glucose-Capillary: 311 mg/dL — ABNORMAL HIGH (ref 70–99)

## 2020-07-30 LAB — BASIC METABOLIC PANEL
Anion gap: 10 (ref 5–15)
BUN: 28 mg/dL — ABNORMAL HIGH (ref 8–23)
CO2: 27 mmol/L (ref 22–32)
Calcium: 7.9 mg/dL — ABNORMAL LOW (ref 8.9–10.3)
Chloride: 100 mmol/L (ref 98–111)
Creatinine, Ser: 1.67 mg/dL — ABNORMAL HIGH (ref 0.44–1.00)
GFR, Estimated: 31 mL/min — ABNORMAL LOW (ref >60)
Glucose, Bld: 170 mg/dL — ABNORMAL HIGH (ref 70–99)
Potassium: 3.7 mmol/L (ref 3.5–5.1)
Sodium: 137 mmol/L (ref 135–145)

## 2020-07-30 LAB — CBC
HCT: 29.3 % — ABNORMAL LOW (ref 36.0–46.0)
Hemoglobin: 9.1 g/dL — ABNORMAL LOW (ref 12.0–15.0)
MCH: 31.3 pg (ref 26.0–34.0)
MCHC: 31.1 g/dL (ref 30.0–36.0)
MCV: 100.7 fL — ABNORMAL HIGH (ref 80.0–100.0)
Platelets: 244 10*3/uL (ref 150–400)
RBC: 2.91 MIL/uL — ABNORMAL LOW (ref 3.87–5.11)
RDW: 17.8 % — ABNORMAL HIGH (ref 11.5–15.5)
WBC: 10 10*3/uL (ref 4.0–10.5)
nRBC: 1 % — ABNORMAL HIGH (ref 0.0–0.2)

## 2020-07-30 LAB — MAGNESIUM: Magnesium: 1.9 mg/dL (ref 1.7–2.4)

## 2020-07-30 NOTE — Progress Notes (Addendum)
[address]       [place] [address]             [phone]      3 Days Post-Op Procedure(s) (LRB): BIV PACEMAKER INSERTION CRT-P (N/A) Subjective: Fearful we will discharge her and she can't take care of herself- reassured her that won't happen  C/O difficulty clearing phlegm  Objective: Vital signs in last 24 hours: Temp:  [97.6 F (36.4 C)-99.3 F (37.4 C)] 97.6 F (36.4 C) (04/02 0410) Pulse Rate:  [74-96] 74 (04/02 0410) Cardiac Rhythm: Normal sinus rhythm;Bundle branch block (04/01 1900) Resp:  [18-20] 18 (04/02 0410) BP: (104-129)/(43-81) 117/55 (04/02 0410) SpO2:  [90 %-99 %] 91 % (04/02 0410)  Hemodynamic parameters for last 24 hours:    Intake/Output from previous day: 04/01 0701 - 04/02 0700 In: 3 [I.V.:3] Out: -  Intake/Output this shift: No intake/output data recorded.  General appearance: alert, cooperative, no distress and weak Heart: regular rate and rhythm Lungs: mildly dim inlower fields Abdomen: soft, non-tender Extremities: no edema Wound: incis healing well  Lab Results: Recent Labs    07/29/20 0154 07/30/20 0114  WBC 8.4 10.0  HGB 9.1* 9.1*  HCT 28.8* 29.3*  PLT 204 244   BMET:  Recent Labs    07/29/20 0154 07/30/20 0114  NA 140 137  K 3.6 3.7  CL 102 100  CO2 30 27  GLUCOSE 144* 170*  BUN 30* 28*  CREATININE 1.63* 1.67*  CALCIUM 8.1* 7.9*    PT/INR: No results for input(s): LABPROT, INR in the last 72 hours. ABG    Component Value Date/Time   PHART 7.442 07/23/2020 0434   HCO3 29.2 (H) 07/23/2020 0434   TCO2 31 07/23/2020 0434   ACIDBASEDEF 1.0 07/22/2020 0814   O2SAT 59.9 07/26/2020 0357   CBG (last 3)  Recent Labs    07/29/20 1548 07/29/20 2101 07/30/20 0616  GLUCAP 203* 155* 183*    Meds Scheduled Meds: . acetaminophen (TYLENOL) oral liquid 160 mg/5 mL  1,000 mg Oral Q6H   Or  . acetaminophen  1,000 mg Oral Q6H  . aspirin  81 mg Oral Daily  . atorvastatin  80 mg Oral q1800   . bisacodyl  10 mg Oral Daily   Or  . bisacodyl  10 mg Rectal Daily  . Chlorhexidine Gluconate Cloth  6 each Topical Daily  . clopidogrel  75 mg Oral Daily  . docusate  200 mg Oral Daily   Or  . docusate sodium  200 mg Oral Daily  . enoxaparin (LOVENOX) injection  30 mg Subcutaneous QHS  . feeding supplement  237 mL Oral TID BM  . furosemide  40 mg Oral Daily  . insulin aspart  0-24 Units Subcutaneous TID WC  . insulin aspart  5 Units Subcutaneous TID WC  . insulin detemir  10 Units Subcutaneous Daily  . mouth rinse  15 mL Mouth Rinse BID  . megestrol  40 mg Oral Daily  . melatonin  3 mg Oral QHS  . midodrine  5 mg Oral TID WC  . multivitamin with minerals  1 tablet Oral Daily  . pantoprazole sodium  40 mg Oral Daily   Or  . pantoprazole  40 mg Oral Daily  . polyethylene glycol  17 g Oral Daily  . potassium chloride  20 mEq Oral Daily  . potassium chloride  20 mEq Oral Once  . QUEtiapine  50 mg Oral QHS  . sodium  chloride flush  3 mL Intravenous Q12H   Continuous Infusions: PRN Meds:.acetaminophen, dextrose, guaiFENesin-dextromethorphan, magic mouthwash, metoprolol tartrate, ondansetron (ZOFRAN) IV, oxyCODONE, traMADol  Xrays No results found.  Assessment/Plan: S/P Procedure(s) (LRB): BIV PACEMAKER INSERTION CRT-P (N/A)  1 afeb, VSS, perm PPM 2 sats ok on RA 3 weight below preop, creat is pretty stable and close to baseline, EFx 40-45 4 expected ABLA is stable 5 BS control is ok, will need to transition to home meds , po intake will need to improve 6 denied CIR, working on SNF with therapies, FL2 placed- remains very limited 7 EP/AHF/cardiology assisting with medical cardiology management    LOS: 10 days    John Giovanni PA-C Pager [phone] 07/30/2020 Patient seen and examined, agree with plan outlined above Medically stable for SNF once arrangements in place  [place] C. Roxan Hockey, MD [facility] [phone]

## 2020-07-30 NOTE — Progress Notes (Signed)
CARDIAC REHAB PHASE I   PRE:  Rate/Rhythm: 96 paced  BP:  Sitting: 104/50      SaO2: 95 RA  MODE:  Ambulation: 50 ft x2   POST:  Rate/Rhythm: 97 paced  BP:  Sitting: 95/77    SaO2: 96 RA  Pt agreeable to ambulate. Pt took 2 tries to stand, c/o pain and weakness. Pt than able to ambulate ~7f in hallway assist of one with gait belt and rollator. Pt took a short standing rest, than ambulated back to room. Pt returned to recliner. Able to demonstrate ~625 on IS. Encouraged continued ambulation and IS use. Will continue to follow.  [id number] Falco RN BSN 07/30/2020 9:25 AM

## 2020-07-30 NOTE — Consult Note (Signed)
Ref: Ernestene Kiel, MD   Subjective:  Weakness continues. Ambulated with some assistance. Patient lives alone as daughter is working.  Objective:  Vital Signs in the last 24 hours: Temp:  [97.6 F (36.4 C)-99.3 F (37.4 C)] 97.9 F (36.6 C) (04/02 1046) Pulse Rate:  [74-96] 91 (04/02 1046) Cardiac Rhythm: Normal sinus rhythm;Bundle branch block (04/02 0715) Resp:  [18-20] 18 (04/02 1046) BP: (102-129)/(43-81) 116/49 (04/02 1046) SpO2:  [90 %-99 %] 92 % (04/02 1046)  Physical Exam: BP Readings from Last 1 Encounters:  07/30/20 (!) 116/49     Wt Readings from Last 1 Encounters:  07/29/20 83.2 kg    Weight change:  Body mass index is 29.62 kg/m. HEENT: [place]/AT, Eyes-Blue, PERL, EOMI, Conjunctiva-Pale pink, Sclera-Non-icteric Neck: No JVD, No bruit, Trachea midline. Lungs:  Clearing, Bilateral. Cardiac:  Regular rhythm, normal S1 and S2, no S3. II/VI systolic murmur. Abdomen:  Soft, non-tender. BS present. Extremities:  No edema present. No cyanosis. No clubbing. CNS: AxOx3, Cranial nerves grossly intact, moves all 4 extremities.  Skin: Warm and dry.   Intake/Output from previous day: 04/01 0701 - 04/02 0700 In: 3 [I.V.:3] Out: -     Lab Results: BMET    Component Value Date/Time   NA 137 07/30/2020 0114   NA 140 07/29/2020 0154   NA 142 07/28/2020 0059   K 3.7 07/30/2020 0114   K 3.6 07/29/2020 0154   K 3.9 07/28/2020 0059   CL 100 07/30/2020 0114   CL 102 07/29/2020 0154   CL 105 07/28/2020 0059   CO2 27 07/30/2020 0114   CO2 30 07/29/2020 0154   CO2 31 07/28/2020 0059   GLUCOSE 170 (H) 07/30/2020 0114   GLUCOSE 144 (H) 07/29/2020 0154   GLUCOSE 187 (H) 07/28/2020 0059   BUN 28 (H) 07/30/2020 0114   BUN 30 (H) 07/29/2020 0154   BUN 33 (H) 07/28/2020 0059   CREATININE 1.67 (H) 07/30/2020 0114   CREATININE 1.63 (H) 07/29/2020 0154   CREATININE 1.72 (H) 07/28/2020 0059   CALCIUM 7.9 (L) 07/30/2020 0114   CALCIUM 8.1 (L) 07/29/2020 0154   CALCIUM  8.1 (L) 07/28/2020 0059   GFRNONAA 31 (L) 07/30/2020 0114   GFRNONAA 32 (L) 07/29/2020 0154   GFRNONAA 30 (L) 07/28/2020 0059   CBC    Component Value Date/Time   WBC 10.0 07/30/2020 0114   RBC 2.91 (L) 07/30/2020 0114   HGB 9.1 (L) 07/30/2020 0114   HCT 29.3 (L) 07/30/2020 0114   PLT 244 07/30/2020 0114   MCV 100.7 (H) 07/30/2020 0114   MCH 31.3 07/30/2020 0114   MCHC 31.1 07/30/2020 0114   RDW 17.8 (H) 07/30/2020 0114   HEPATIC Function Panel Recent Labs    07/26/20 0357  PROT 4.2*   HEMOGLOBIN A1C No components found for: HGA1C,  MPG CARDIAC ENZYMES No results found for: CKTOTAL, CKMB, CKMBINDEX, TROPONINI BNP No results for input(s): PROBNP in the last 8760 hours. TSH No results for input(s): TSH in the last 8760 hours. CHOLESTEROL Recent Labs    07/21/20 0442  CHOL 170    Scheduled Meds: . acetaminophen (TYLENOL) oral liquid 160 mg/5 mL  1,000 mg Oral Q6H   Or  . acetaminophen  1,000 mg Oral Q6H  . aspirin  81 mg Oral Daily  . atorvastatin  80 mg Oral q1800  . bisacodyl  10 mg Oral Daily   Or  . bisacodyl  10 mg Rectal Daily  . Chlorhexidine Gluconate Cloth  6 each Topical  Daily  . clopidogrel  75 mg Oral Daily  . docusate  200 mg Oral Daily   Or  . docusate sodium  200 mg Oral Daily  . enoxaparin (LOVENOX) injection  30 mg Subcutaneous QHS  . feeding supplement  237 mL Oral TID BM  . furosemide  40 mg Oral Daily  . insulin aspart  0-24 Units Subcutaneous TID WC  . insulin aspart  5 Units Subcutaneous TID WC  . insulin detemir  10 Units Subcutaneous Daily  . mouth rinse  15 mL Mouth Rinse BID  . megestrol  40 mg Oral Daily  . melatonin  3 mg Oral QHS  . midodrine  5 mg Oral TID WC  . multivitamin with minerals  1 tablet Oral Daily  . pantoprazole sodium  40 mg Oral Daily   Or  . pantoprazole  40 mg Oral Daily  . polyethylene glycol  17 g Oral Daily  . potassium chloride  20 mEq Oral Daily  . potassium chloride  20 mEq Oral Once  . QUEtiapine   50 mg Oral QHS  . sodium chloride flush  3 mL Intravenous Q12H   Continuous Infusions: PRN Meds:.acetaminophen, dextrose, guaiFENesin-dextromethorphan, magic mouthwash, metoprolol tartrate, ondansetron (ZOFRAN) IV, oxyCODONE, traMADol  Assessment/Plan: Acute NSTEMI CABG x 5 S/P cardiac tamponade and hematoma evacuation S/P pacemaker placement for CHB HTN Type 2 DM Anemia of blood loss Acute on chronic kidney injury, IIIb Generalized weakness  Plan: Increase ambulation as tolerated.   LOS: 10 days   Time spent including chart review, lab review, examination, discussion with patient/Nurse : 30 min   Dixie Dials  MD  07/30/2020, 11:16 AM

## 2020-07-30 NOTE — Progress Notes (Signed)
Mobility Specialist - Progress Note   07/30/20 1300  Mobility  Activity Ambulated in hall  Level of Assistance Minimal assist, patient does 75% or more  Assistive Device Front wheel walker  Distance Ambulated (ft) 70 ft  Mobility Response Tolerated fair  Mobility performed by Mobility specialist  $Mobility charge 1 Mobility   Pt received leaving bathroom, agreed to walk after some convincing. Pt stopped several times saying "I feel like I am going to cave in". Pt sitting up on edge of bed after walk, NT and family in room. VSS.   Pricilla Handler Mobility Specialist Mobility Specialist Phone: [phone]

## 2020-07-31 LAB — CBC
HCT: 26.3 % — ABNORMAL LOW (ref 36.0–46.0)
Hemoglobin: 8.3 g/dL — ABNORMAL LOW (ref 12.0–15.0)
MCH: 31.3 pg (ref 26.0–34.0)
MCHC: 31.6 g/dL (ref 30.0–36.0)
MCV: 99.2 fL (ref 80.0–100.0)
Platelets: 282 10*3/uL (ref 150–400)
RBC: 2.65 MIL/uL — ABNORMAL LOW (ref 3.87–5.11)
RDW: 17.8 % — ABNORMAL HIGH (ref 11.5–15.5)
WBC: 9.2 10*3/uL (ref 4.0–10.5)
nRBC: 0.3 % — ABNORMAL HIGH (ref 0.0–0.2)

## 2020-07-31 LAB — GLUCOSE, CAPILLARY
Glucose-Capillary: 340 mg/dL — ABNORMAL HIGH (ref 70–99)
Glucose-Capillary: 305 mg/dL — ABNORMAL HIGH (ref 70–99)
Glucose-Capillary: 123 mg/dL — ABNORMAL HIGH (ref 70–99)
Glucose-Capillary: 108 mg/dL — ABNORMAL HIGH (ref 70–99)

## 2020-07-31 LAB — BASIC METABOLIC PANEL
Anion gap: 7 (ref 5–15)
BUN: 33 mg/dL — ABNORMAL HIGH (ref 8–23)
CO2: 28 mmol/L (ref 22–32)
Calcium: 7.8 mg/dL — ABNORMAL LOW (ref 8.9–10.3)
Chloride: 99 mmol/L (ref 98–111)
Creatinine, Ser: 1.83 mg/dL — ABNORMAL HIGH (ref 0.44–1.00)
GFR, Estimated: 28 mL/min — ABNORMAL LOW (ref >60)
Glucose, Bld: 380 mg/dL — ABNORMAL HIGH (ref 70–99)
Potassium: 3.9 mmol/L (ref 3.5–5.1)
Sodium: 134 mmol/L — ABNORMAL LOW (ref 135–145)

## 2020-07-31 LAB — MAGNESIUM: Magnesium: 2 mg/dL (ref 1.7–2.4)

## 2020-07-31 MED ORDER — INSULIN DETEMIR 100 UNIT/ML ~~LOC~~ SOLN
18.0000 [IU] | Freq: Every day | SUBCUTANEOUS | Status: DC
  Administered 2020-07-31 – 2020-08-02 (×3): 18 [IU] via SUBCUTANEOUS
  Filled 2020-07-31 (×4): qty 0.18

## 2020-07-31 NOTE — TOC Progression Note (Signed)
Transition of Care [facility]) - Progression Note    Patient Details  Name: Kristin [name] MRN: [id number] Date of Birth: 08-13-1940  Transition of Care [facility]) CM/SW [place], [place] Phone Number: [phone] 07/31/2020, 3:51 PM  Clinical Narrative:     Patient has not bed offers.  TOC team will continue to assist with discharge planning needs.       Expected Discharge Plan and Services                                                 Social Determinants of Health (SDOH) Interventions    Readmission Risk Interventions No flowsheet data found.

## 2020-07-31 NOTE — Consult Note (Signed)
Ref: Ernestene Kiel, MD   Subjective:  Feeling little better except nausea and some cough. Ambulated some. Leg edema is decreasing. VS stable.  Objective:  Vital Signs in the last 24 hours: Temp:  [98.2 F (36.8 C)-99 F (37.2 C)] 98.2 F (36.8 C) (04/03 0711) Pulse Rate:  [84-92] 91 (04/03 0711) Cardiac Rhythm: Ventricular paced;Other (Comment) (04/03 0700) Resp:  [16-18] 18 (04/03 0711) BP: (107-127)/(50-60) 113/54 (04/03 0711) SpO2:  [90 %-95 %] 90 % (04/03 0711)  Physical Exam: BP Readings from Last 1 Encounters:  07/31/20 (!) 113/54     Wt Readings from Last 1 Encounters:  07/29/20 83.2 kg    Weight change:  Body mass index is 29.62 kg/m. HEENT: [place]/AT, Eyes-Blue, Conjunctiva-Pale, Sclera-Non-icteric Neck: No JVD, No bruit, Trachea midline. Lungs:  Fine basal crackles, Bilateral. Cardiac:  Regular rhythm, normal S1 and S2, no S3. II/VI systolic murmur. Abdomen:  Soft, non-tender. BS present. Extremities:  Trace edema present. No cyanosis. No clubbing. CNS: AxOx3, Cranial nerves grossly intact, moves all 4 extremities.  Skin: Warm and dry.   Intake/Output from previous day: 04/02 0701 - 04/03 0700 In: 3 [I.V.:3] Out: 800 [Urine:800]    Lab Results: BMET    Component Value Date/Time   NA 134 (L) 07/31/2020 0137   NA 137 07/30/2020 0114   NA 140 07/29/2020 0154   K 3.9 07/31/2020 0137   K 3.7 07/30/2020 0114   K 3.6 07/29/2020 0154   CL 99 07/31/2020 0137   CL 100 07/30/2020 0114   CL 102 07/29/2020 0154   CO2 28 07/31/2020 0137   CO2 27 07/30/2020 0114   CO2 30 07/29/2020 0154   GLUCOSE 380 (H) 07/31/2020 0137   GLUCOSE 170 (H) 07/30/2020 0114   GLUCOSE 144 (H) 07/29/2020 0154   BUN 33 (H) 07/31/2020 0137   BUN 28 (H) 07/30/2020 0114   BUN 30 (H) 07/29/2020 0154   CREATININE 1.83 (H) 07/31/2020 0137   CREATININE 1.67 (H) 07/30/2020 0114   CREATININE 1.63 (H) 07/29/2020 0154   CALCIUM 7.8 (L) 07/31/2020 0137   CALCIUM 7.9 (L) 07/30/2020 0114    CALCIUM 8.1 (L) 07/29/2020 0154   GFRNONAA 28 (L) 07/31/2020 0137   GFRNONAA 31 (L) 07/30/2020 0114   GFRNONAA 32 (L) 07/29/2020 0154   CBC    Component Value Date/Time   WBC 9.2 07/31/2020 0137   RBC 2.65 (L) 07/31/2020 0137   HGB 8.3 (L) 07/31/2020 0137   HCT 26.3 (L) 07/31/2020 0137   PLT 282 07/31/2020 0137   MCV 99.2 07/31/2020 0137   MCH 31.3 07/31/2020 0137   MCHC 31.6 07/31/2020 0137   RDW 17.8 (H) 07/31/2020 0137   HEPATIC Function Panel Recent Labs    07/26/20 0357  PROT 4.2*   HEMOGLOBIN A1C No components found for: HGA1C,  MPG CARDIAC ENZYMES No results found for: CKTOTAL, CKMB, CKMBINDEX, TROPONINI BNP No results for input(s): PROBNP in the last 8760 hours. TSH No results for input(s): TSH in the last 8760 hours. CHOLESTEROL Recent Labs    07/21/20 0442  CHOL 170    Scheduled Meds: . acetaminophen (TYLENOL) oral liquid 160 mg/5 mL  1,000 mg Oral Q6H   Or  . acetaminophen  1,000 mg Oral Q6H  . aspirin  81 mg Oral Daily  . atorvastatin  80 mg Oral q1800  . bisacodyl  10 mg Oral Daily   Or  . bisacodyl  10 mg Rectal Daily  . Chlorhexidine Gluconate Cloth  6 each Topical  Daily  . clopidogrel  75 mg Oral Daily  . docusate  200 mg Oral Daily   Or  . docusate sodium  200 mg Oral Daily  . enoxaparin (LOVENOX) injection  30 mg Subcutaneous QHS  . feeding supplement  237 mL Oral TID BM  . insulin aspart  0-24 Units Subcutaneous TID WC  . insulin aspart  5 Units Subcutaneous TID WC  . insulin detemir  18 Units Subcutaneous Daily  . mouth rinse  15 mL Mouth Rinse BID  . megestrol  40 mg Oral Daily  . melatonin  3 mg Oral QHS  . midodrine  5 mg Oral TID WC  . multivitamin with minerals  1 tablet Oral Daily  . pantoprazole sodium  40 mg Oral Daily   Or  . pantoprazole  40 mg Oral Daily  . polyethylene glycol  17 g Oral Daily  . potassium chloride  20 mEq Oral Daily  . potassium chloride  20 mEq Oral Once  . QUEtiapine  50 mg Oral QHS  . sodium  chloride flush  3 mL Intravenous Q12H   Continuous Infusions: PRN Meds:.acetaminophen, dextrose, guaiFENesin-dextromethorphan, magic mouthwash, metoprolol tartrate, ondansetron (ZOFRAN) IV, oxyCODONE, traMADol  Assessment/Plan: Acute NSTEMI CABG x 5 S/P Bi-V pacemaker insertion Type 2 DM Hypertension Anemia of blood loss Acute on chronic kidney injury, IV Generalized weakness  Plan: Increase activity.   LOS: 11 days   Time spent including chart review, lab review, examination, discussion with patient/Nurse : 30 min   Dixie Dials  MD  07/31/2020, 11:25 AM

## 2020-07-31 NOTE — Progress Notes (Addendum)
[address] City[address]       [place] [address]             [phone]      4 Days Post-Op Procedure(s) (LRB): BIV PACEMAKER INSERTION CRT-P (N/A) Subjective: Feels a little stronger when walking but remains pretty weak, walked 70 ft  Primary c/o is cough, some sputum but mostly dry  Objective: Vital signs in last 24 hours: Temp:  [97.9 F (36.6 C)-99 F (37.2 C)] 98.2 F (36.8 C) (04/03 0711) Pulse Rate:  [84-92] 91 (04/03 0711) Cardiac Rhythm: Ventricular paced (04/02 1900) Resp:  [16-18] 18 (04/03 0711) BP: (102-127)/(49-60) 113/54 (04/03 0711) SpO2:  [90 %-95 %] 90 % (04/03 0711)  Hemodynamic parameters for last 24 hours:    Intake/Output from previous day: 04/02 0701 - 04/03 0700 In: 3 [I.V.:3] Out: 800 [Urine:800] Intake/Output this shift: No intake/output data recorded.  General appearance: alert, cooperative and no distress Heart: regular rate and rhythm Lungs: clear to auscultation bilaterally Abdomen: benign Extremities: no edema Wound: incis healing well  Lab Results: Recent Labs    07/30/20 0114 07/31/20 0137  WBC 10.0 9.2  HGB 9.1* 8.3*  HCT 29.3* 26.3*  PLT 244 282   BMET:  Recent Labs    07/30/20 0114 07/31/20 0137  NA 137 134*  K 3.7 3.9  CL 100 99  CO2 27 28  GLUCOSE 170* 380*  BUN 28* 33*  CREATININE 1.67* 1.83*  CALCIUM 7.9* 7.8*    PT/INR: No results for input(s): LABPROT, INR in the last 72 hours. ABG    Component Value Date/Time   PHART 7.442 07/23/2020 0434   HCO3 29.2 (H) 07/23/2020 0434   TCO2 31 07/23/2020 0434   ACIDBASEDEF 1.0 07/22/2020 0814   O2SAT 59.9 07/26/2020 0357   CBG (last 3)  Recent Labs    07/30/20 1610 07/30/20 2018 07/31/20 0608  GLUCAP 244* 311* 340*    Meds Scheduled Meds: . acetaminophen (TYLENOL) oral liquid 160 mg/5 mL  1,000 mg Oral Q6H   Or  . acetaminophen  1,000 mg Oral Q6H  . aspirin  81 mg Oral Daily  . atorvastatin  80 mg Oral q1800  . bisacodyl  10 mg Oral  Daily   Or  . bisacodyl  10 mg Rectal Daily  . Chlorhexidine Gluconate Cloth  6 each Topical Daily  . clopidogrel  75 mg Oral Daily  . docusate  200 mg Oral Daily   Or  . docusate sodium  200 mg Oral Daily  . enoxaparin (LOVENOX) injection  30 mg Subcutaneous QHS  . feeding supplement  237 mL Oral TID BM  . furosemide  40 mg Oral Daily  . insulin aspart  0-24 Units Subcutaneous TID WC  . insulin aspart  5 Units Subcutaneous TID WC  . insulin detemir  10 Units Subcutaneous Daily  . mouth rinse  15 mL Mouth Rinse BID  . megestrol  40 mg Oral Daily  . melatonin  3 mg Oral QHS  . midodrine  5 mg Oral TID WC  . multivitamin with minerals  1 tablet Oral Daily  . pantoprazole sodium  40 mg Oral Daily   Or  . pantoprazole  40 mg Oral Daily  . polyethylene glycol  17 g Oral Daily  . potassium chloride  20 mEq Oral Daily  . potassium chloride  20 mEq Oral Once  . QUEtiapine  50 mg Oral QHS  . sodium chloride flush  3  mL Intravenous Q12H   Continuous Infusions: PRN Meds:.acetaminophen, dextrose, guaiFENesin-dextromethorphan, magic mouthwash, metoprolol tartrate, ondansetron (ZOFRAN) IV, oxyCODONE, traMADol  Xrays No results found.  Assessment/Plan: S/P Procedure(s) (LRB): BIV PACEMAKER INSERTION CRT-P (N/A)  1 tmax 99, VSS, PPM 2 sats ok in RA but low 90's 3 creat up a bit to 1.83, weight fairly stable , may need to back off some on diuretic - AHF team has been managing 4 Hgb down a bit to 8.3 today- monitor, if drops further may need further eval- she is on ASA, Plavix and lovenox 5 BS control is worse- increase levemir- hopefully can start home meds when intake improves and labs are stable 6 she is getting daily cbc/bmet- f/u in am 7 push pulm toilet/rehab as able 8 SNF - TOC team assisting with facility procurement   LOS: 11 days    John Giovanni PA-C Pager [phone] 07/31/2020 Patient seen and examined, agree with above SNF placement pending  Revonda Standard. Roxan Hockey,  MD [facility] [phone]

## 2020-07-31 NOTE — Progress Notes (Signed)
Mobility Specialist - Progress Note   07/31/20 1347  Mobility  Activity Ambulated in hall  Level of Assistance Moderate assist, patient does 50-74%  Assistive Device Front wheel walker  Distance Ambulated (ft) 80 ft (40 ft x 2)  Mobility Response Tolerated well  Mobility performed by Mobility specialist  $Mobility charge 1 Mobility   Pre-mobility: 90 HR, 123/50 BP Post-mobility: 97 HR, 140/56 BP  Pt mod assist w/ knee block to stand from recliner after 3 trials. She required one standing rest break due to fatigue. Pt in bed after walk, min assist to elevate legs when getting in bed.   Pricilla Handler Mobility Specialist Mobility Specialist Phone: [phone]

## 2020-08-01 ENCOUNTER — Inpatient Hospital Stay (HOSPITAL_COMMUNITY): Payer: Medicare HMO

## 2020-08-01 DIAGNOSIS — J90 Pleural effusion, not elsewhere classified: Secondary | ICD-10-CM | POA: Diagnosis not present

## 2020-08-01 DIAGNOSIS — Z951 Presence of aortocoronary bypass graft: Secondary | ICD-10-CM | POA: Diagnosis not present

## 2020-08-01 DIAGNOSIS — I5030 Unspecified diastolic (congestive) heart failure: Secondary | ICD-10-CM | POA: Diagnosis not present

## 2020-08-01 DIAGNOSIS — I214 Non-ST elevation (NSTEMI) myocardial infarction: Secondary | ICD-10-CM | POA: Diagnosis not present

## 2020-08-01 LAB — URINALYSIS, COMPLETE (UACMP) WITH MICROSCOPIC
Bilirubin Urine: NEGATIVE
Glucose, UA: NEGATIVE mg/dL
Hgb urine dipstick: NEGATIVE
Ketones, ur: NEGATIVE mg/dL
Nitrite: NEGATIVE
Protein, ur: NEGATIVE mg/dL
Specific Gravity, Urine: 1.011 (ref 1.005–1.030)
pH: 5 (ref 5.0–8.0)

## 2020-08-01 LAB — GLUCOSE, CAPILLARY
Glucose-Capillary: 183 mg/dL — ABNORMAL HIGH (ref 70–99)
Glucose-Capillary: 259 mg/dL — ABNORMAL HIGH (ref 70–99)
Glucose-Capillary: 209 mg/dL — ABNORMAL HIGH (ref 70–99)
Glucose-Capillary: 80 mg/dL (ref 70–99)

## 2020-08-01 LAB — BASIC METABOLIC PANEL
Anion gap: 11 (ref 5–15)
BUN: 28 mg/dL — ABNORMAL HIGH (ref 8–23)
CO2: 27 mmol/L (ref 22–32)
Calcium: 8.1 mg/dL — ABNORMAL LOW (ref 8.9–10.3)
Chloride: 101 mmol/L (ref 98–111)
Creatinine, Ser: 1.72 mg/dL — ABNORMAL HIGH (ref 0.44–1.00)
GFR, Estimated: 30 mL/min — ABNORMAL LOW (ref >60)
Glucose, Bld: 127 mg/dL — ABNORMAL HIGH (ref 70–99)
Potassium: 3.9 mmol/L (ref 3.5–5.1)
Sodium: 139 mmol/L (ref 135–145)

## 2020-08-01 LAB — ECHOCARDIOGRAM LIMITED
Area-P 1/2: 4.1 cm2
Height: 66 in
Weight: 2945.6 oz

## 2020-08-01 LAB — CBC
HCT: 27.5 % — ABNORMAL LOW (ref 36.0–46.0)
Hemoglobin: 8.5 g/dL — ABNORMAL LOW (ref 12.0–15.0)
MCH: 31.8 pg (ref 26.0–34.0)
MCHC: 30.9 g/dL (ref 30.0–36.0)
MCV: 103 fL — ABNORMAL HIGH (ref 80.0–100.0)
Platelets: 346 10*3/uL (ref 150–400)
RBC: 2.67 MIL/uL — ABNORMAL LOW (ref 3.87–5.11)
RDW: 18.3 % — ABNORMAL HIGH (ref 11.5–15.5)
WBC: 11.7 10*3/uL — ABNORMAL HIGH (ref 4.0–10.5)
nRBC: 0.3 % — ABNORMAL HIGH (ref 0.0–0.2)

## 2020-08-01 LAB — MAGNESIUM: Magnesium: 2 mg/dL (ref 1.7–2.4)

## 2020-08-01 MED ORDER — POTASSIUM CHLORIDE CRYS ER 20 MEQ PO TBCR
20.0000 meq | EXTENDED_RELEASE_TABLET | Freq: Once | ORAL | Status: AC
  Administered 2020-08-01: 20 meq via ORAL
  Filled 2020-08-01: qty 1

## 2020-08-01 MED ORDER — POTASSIUM CHLORIDE CRYS ER 10 MEQ PO TBCR
20.0000 meq | EXTENDED_RELEASE_TABLET | Freq: Two times a day (BID) | ORAL | Status: DC
  Administered 2020-08-01 – 2020-08-07 (×14): 20 meq via ORAL
  Filled 2020-08-01 (×17): qty 2

## 2020-08-01 MED ORDER — FUROSEMIDE 10 MG/ML IJ SOLN
80.0000 mg | Freq: Two times a day (BID) | INTRAMUSCULAR | Status: DC
  Administered 2020-08-01 – 2020-08-04 (×7): 80 mg via INTRAVENOUS
  Filled 2020-08-01 (×7): qty 8

## 2020-08-01 MED ORDER — FUROSEMIDE 40 MG PO TABS: 40.0000 mg | ORAL_TABLET | Freq: Once | ORAL | Status: DC

## 2020-08-01 MED ORDER — FUROSEMIDE 10 MG/ML IJ SOLN
40.0000 mg | Freq: Once | INTRAMUSCULAR | Status: AC
  Administered 2020-08-01: 40 mg via INTRAVENOUS
  Filled 2020-08-01: qty 4

## 2020-08-01 NOTE — Progress Notes (Signed)
Physical Therapy Treatment Patient Details Name: Kristin [name] MRN: [id number] DOB: 01/22/1941 Today's Date: 08/01/2020    History of Present Illness Kristin [name] is a 80 y/o female admitted 3/23 from [place] with emergent cath then to ICU on 07/21/20. Pt with NSTEMI and underwent CABGx5 on 07/22/20. Post-op tamponade requiring return to OR on 3/25 for hematoma evacuation and then extubated on 3/26. Underwent BIV pacemaker insertion 3/30. PMH includes DM,HTN, HLD, and history of gallbladder surgery.    PT Comments    Pt received in supine, agreeable to therapy session with encouragement but c/o significant fatigue prior to mobility. Per pt/RN, pt had performed multiple short gait tasks to bathroom during day and had x2 tests off floor, so pt deferred gait trial but agreeable to supine/seated therex and transfer training. Pt unable to state sternal precautions and needed multimodal cues for log roll sequencing and safety with transfers, needing modA to stand from elevated surface to RW. Therex with good tolerance as detailed below, she had most difficulty with hip abduction (limited ROM bilaterally and straight leg raise, needing active assist to perform and fatiguing after 5 reps. Pt reports 7/10 modified RPE (fatigue) at end of session. Pt continues to benefit from PT services to progress toward functional mobility goals. Continue to recommend post-acute PT.    Follow Up Recommendations  CIR;Supervision for mobility/OOB     Equipment Recommendations  Rolling walker with 5" wheels;3in1 (PT)    Recommendations for Other Services       Precautions / Restrictions Precautions Precautions: Sternal;ICD/Pacemaker Precaution Booklet Issued: Yes (comment) Precaution Comments: pacemaker placement 3/30    Mobility  Bed Mobility Overal bed mobility: Needs Assistance Bed Mobility: Rolling;Sidelying to Sit;Sit to Sidelying Rolling: Min assist Sidelying to sit: Mod assist     Sit to sidelying: Mod  assist General bed mobility comments: VC's for sternal precautions and log roll sequencing, needs tactile cues for trunk rise/lowering to prevent winging elbows out and not to extend L shoulder when repositioning in bed    Transfers Overall transfer level: Needs assistance Equipment used: Rolling walker (2 wheeled) Transfers: Sit to/from Stand Sit to Stand: Mod assist         General transfer comment: cues for sternal precautions, assist to power up and stabilize balance, needed x2 attempts to reach upright and needed bed elevated 2"  Ambulation/Gait Ambulation/Gait assistance: Min assist Gait Distance (Feet): 3 Feet Assistive device: Rolling walker (2 wheeled) Gait Pattern/deviations: Step-through pattern Gait velocity: decreased   General Gait Details: sidesteps toward [place]             Wheelchair Mobility    Modified Rankin (Stroke Patients Only)       Balance Overall balance assessment: Needs assistance Sitting-balance support: Feet supported;No upper extremity supported Sitting balance-Leahy Scale: Fair Sitting balance - Comments: able to sit with LUE in lap and RUE support   Standing balance support: Bilateral upper extremity supported;During functional activity Standing balance-Leahy Scale: Poor Standing balance comment: reliant on RW and external assist                            Cognition Arousal/Alertness: Awake/alert Behavior During Therapy: [facility] for tasks assessed/performed;Anxious Overall Cognitive Status: Within Functional Limits for tasks assessed                                 General Comments: mildly  anxious regarding mobility      Exercises Other Exercises Other Exercises: supine BLE AROM: AP, heel slides, hip abduction (some AA needed for proper form), SLR (AA needed), glute sets x10 reps ea Other Exercises: Seated BLE AROM: hip flexion, LAQ x10 reps ea    General Comments General comments (skin  integrity, edema, etc.): HR 91 bpm with activity, pt c/o signficant L pectoral/shoulder discomfort and moderate sternal discomfort, needs mod cues with all mobility for compliance with sternal/PM precs, BP not assessed as pt denies pain with mobility      Pertinent Vitals/Pain Pain Assessment: Faces Faces Pain Scale: Hurts whole lot Pain Location: LUE PPM incision>sternal incision Pain Descriptors / Indicators: Discomfort;Guarding;Grimacing;Moaning Pain Intervention(s): Monitored during session;Repositioned;Limited activity within patient's tolerance    Home Living                      Prior Function            PT Goals (current goals can now be found in the care plan section) Acute Rehab PT Goals Patient Stated Goal: move a little better so I can go home PT Goal Formulation: With patient Time For Goal Achievement: 08/09/20 Potential to Achieve Goals: Good Progress towards PT goals: Progressing toward goals (limited progress this date 2/2 fatigue)    Frequency    Min 3X/week      PT Plan Current plan remains appropriate    Co-evaluation              AM-PAC PT "6 Clicks" Mobility   Outcome Measure  Help needed turning from your back to your side while in a flat bed without using bedrails?: A Little Help needed moving from lying on your back to sitting on the side of a flat bed without using bedrails?: A Lot Help needed moving to and from a bed to a chair (including a wheelchair)?: A Lot Help needed standing up from a chair using your arms (e.g., wheelchair or bedside chair)?: A Lot Help needed to walk in hospital room?: A Little Help needed climbing 3-5 steps with a railing? : A Lot 6 Click Score: 14    End of Session Equipment Utilized During Treatment: Gait belt Activity Tolerance: Patient limited by fatigue;Patient limited by pain Patient left: in bed;with call bell/phone within reach;with bed alarm set (refusing chair, reports she sat up in chair >2  hours in AM) Nurse Communication: Mobility status PT Visit Diagnosis: Unsteadiness on feet (R26.81);Muscle weakness (generalized) (M62.81);Difficulty in walking, not elsewhere classified (R26.2)     Time: [id number] PT Time Calculation (min) (ACUTE ONLY): 30 min  Charges:  $Therapeutic Exercise: 8-22 mins $Therapeutic Activity: 8-22 mins                     Samaad Hashem P., PTA Acute Rehabilitation Services Pager: [phone] Office: [place] 08/01/2020, 4:17 PM

## 2020-08-01 NOTE — TOC Progression Note (Addendum)
Transition of Care (TOC) - Progression Note  Heart Failure   Patient Details  Name: Kristin [name] MRN: [id number] Date of Birth: 24-Sep-1940  Transition of Care [facility]) CM/SW [place], [place] Phone Number: 08/01/2020, 3:20 PM  Clinical Narrative:    CSW spoke with patient at bedside about her health insurance denying her for CIR and that SNF is the next option for her.  CSW received consult for possible SNF placement at time of discharge. CSW spoke with patient at bedside. Patient expressed understanding of PT recommendation and is agreeable to SNF placement at time of discharge. Patient does not report having a preference and wants to speak with her daughter this evening. CSW discussed insurance authorization process and provided Medicare SNF ratings list. It is unclear if the patient has received the COVID vaccines. Patient expressed being hopeful for rehab and to feel better soon. No further questions reported at this time. CSW provided the patient with her name, number and position to reach out after speaking with her daughter.  CSW received a call from the patients daughter, Unice Cobble [phone]) at 4:54pm regarding SNF placement and having a preference for [place] SNF's including [facility], [facility] [place]. CSW faxed out to several other locations in [place] and will call SNF's tomorrow about bed availability.  TOC will continue to follow for d/c needs.    Expected Discharge Plan: [place] Barriers to Discharge: Continued Medical Work up  Expected Discharge Plan and Services Expected Discharge Plan: [place] In-house Referral: Clinical Social Work Discharge Planning Services: CM Consult Post Acute Care Choice: [place] arrangements for the past 2 months: Single Family Home                                       Social Determinants of Health (SDOH) Interventions     Readmission Risk Interventions No flowsheet data found.  Treniyah Lynn, MSW, [place] Heart Failure Social Worker

## 2020-08-01 NOTE — Progress Notes (Signed)
[address] Green[address]       [place] [address]             [phone]      5 Days Post-Op Procedure(s) (LRB): BIV PACEMAKER INSERTION CRT-P (N/A) Subjective: Feels more SOB  Objective: Vital signs in last 24 hours: Temp:  [98.2 F (36.8 C)-99 F (37.2 C)] 98.9 F (37.2 C) (04/04 0422) Pulse Rate:  [40-95] 94 (04/04 0422) Cardiac Rhythm: Ventricular paced;Normal sinus rhythm;Bundle branch block (04/03 1935) Resp:  [18-20] 20 (04/04 0422) BP: (114-123)/(49-58) 123/55 (04/04 0422) SpO2:  [90 %-98 %] 90 % (04/04 0422) Weight:  [83.5 kg] 83.5 kg (04/04 0425)  Hemodynamic parameters for last 24 hours:    Intake/Output from previous day: 04/03 0701 - 04/04 0700 In: 243 [P.O.:240; I.V.:3] Out: -  Intake/Output this shift: No intake/output data recorded.  General appearance: alert, cooperative and no distress Heart: regular rate and rhythm Lungs: more dim left base Abdomen: benign Extremities: no edema Wound: incis healing well  Lab Results: Recent Labs    07/31/20 0137 08/01/20 0411  WBC 9.2 11.7*  HGB 8.3* 8.5*  HCT 26.3* 27.5*  PLT 282 346   BMET:  Recent Labs    07/31/20 0137 08/01/20 0411  NA 134* 139  K 3.9 3.9  CL 99 101  CO2 28 27  GLUCOSE 380* 127*  BUN 33* 28*  CREATININE 1.83* 1.72*  CALCIUM 7.8* 8.1*    PT/INR: No results for input(s): LABPROT, INR in the last 72 hours. ABG    Component Value Date/Time   PHART 7.442 07/23/2020 0434   HCO3 29.2 (H) 07/23/2020 0434   TCO2 31 07/23/2020 0434   ACIDBASEDEF 1.0 07/22/2020 0814   O2SAT 59.9 07/26/2020 0357   CBG (last 3)  Recent Labs    07/31/20 1622 07/31/20 2124 08/01/20 0601  GLUCAP 123* 108* 183*    Meds Scheduled Meds: . acetaminophen (TYLENOL) oral liquid 160 mg/5 mL  1,000 mg Oral Q6H   Or  . acetaminophen  1,000 mg Oral Q6H  . aspirin  81 mg Oral Daily  . atorvastatin  80 mg Oral q1800  . bisacodyl  10 mg Oral Daily   Or  . bisacodyl  10 mg Rectal Daily   . Chlorhexidine Gluconate Cloth  6 each Topical Daily  . clopidogrel  75 mg Oral Daily  . docusate  200 mg Oral Daily   Or  . docusate sodium  200 mg Oral Daily  . enoxaparin (LOVENOX) injection  30 mg Subcutaneous QHS  . feeding supplement  237 mL Oral TID BM  . insulin aspart  0-24 Units Subcutaneous TID WC  . insulin aspart  5 Units Subcutaneous TID WC  . insulin detemir  18 Units Subcutaneous Daily  . mouth rinse  15 mL Mouth Rinse BID  . megestrol  40 mg Oral Daily  . melatonin  3 mg Oral QHS  . midodrine  5 mg Oral TID WC  . multivitamin with minerals  1 tablet Oral Daily  . pantoprazole sodium  40 mg Oral Daily   Or  . pantoprazole  40 mg Oral Daily  . polyethylene glycol  17 g Oral Daily  . potassium chloride  20 mEq Oral Daily  . potassium chloride  20 mEq Oral Once  . QUEtiapine  50 mg Oral QHS  . sodium chloride flush  3 mL Intravenous Q12H   Continuous Infusions: PRN Meds:.acetaminophen, dextrose, guaiFENesin-dextromethorphan, magic mouthwash, metoprolol tartrate,  ondansetron (ZOFRAN) IV, oxyCODONE, traMADol  Xrays No results found.  Assessment/Plan: S/P Procedure(s) (LRB): BIV PACEMAKER INSERTION CRT-P (N/A)   1 Tmax 99, VSS, paced rhythm 2 sats good on RA 3 H/H slightly improved 4 minor leukocytosis 5 renal fxn improved trend, around baseline 6 BS are better on increased levemir 7 will get CXR with increasinsg SOB to eval for effusions 8 awaits SNF    LOS: 12 days    John Giovanni PA-C Pager [phone] 08/01/2020

## 2020-08-01 NOTE — Care Management Important Message (Signed)
Important Message  Patient Details  Name: Kristin [name] MRN: [id number] Date of Birth: [birth year]   Medicare Important Message Given:  Yes     [name] 08/01/2020, 10:26 AM

## 2020-08-01 NOTE — Social Work (Signed)
Patient has no bed offers at this time.   CSW will continue to follow and seek placement.  Thurmond Butts, MSW, LCSW Clinical Social Worker

## 2020-08-01 NOTE — Progress Notes (Signed)
  Echocardiogram 2D Echocardiogram has been performed.  Jennette Dubin 08/01/2020, 2:37 PM

## 2020-08-01 NOTE — Progress Notes (Signed)
Nutrition Follow-up  DOCUMENTATION CODES:   Not applicable  INTERVENTION:   If intake unable to progress further, recommend placement of Cortrak with EN given prolonged poor intake.    Increase Ensure Enlive po TID, each supplement provides 350 kcal and 20 grams of protein  MVI daily   NUTRITION DIAGNOSIS:   Inadequate oral intake related to acute illness,inability to eat,decreased appetite (Cardiogenic shock and CAD) as evidenced by meal completion < 50%,NPO status.  Ongoing  GOAL:   Patient will meet greater than or equal to 90% of their needs   Progressing   MONITOR:   PO intake,Supplement acceptance,Diet advancement  REASON FOR ASSESSMENT:   Ventilator    ASSESSMENT:  80 yo female admitted with NSTEMI, required emergent CABG x 5 on 3/24, cardiogenic shock. PMH includes DM, obesity, HTN, HL   3/24 - s/p CABG x 5 3/25 - s/p OR for Mediastinal exploration 3/26 - extubated 3/28 - SLP recs Dys 2 with thins  Intake progressing slowly. Last two meal completions charted as 25%. Patient reports swallowing issues are her main barrier to eating more. She has to take her time with each bite. Like Ensure and is taking them BID. Recommend continuation of Ensure vs Glucerna as it provides more kcal and protein. Given pt's hx of DM, insulin may need to be adjusted, follow CBG trends.   Admission weight: 78.1 kg  Current weight: 83.5 kg   Medications: dulcolax, colace, 80 mg lasix BID, SS novolog, levemir, 40 mg megace, miralax, 20 mEq KCl BID,  Labs: CBG 108-259  Diet Order:   Diet Order            DIET DYS 2 Room service appropriate? Yes with Assist; Fluid consistency: Thin  Diet effective now                EDUCATION NEEDS:  Education needs have been addressed  Skin:  Skin Assessment: Skin Integrity Issues: Skin Integrity Issues:: Incisions Incisions: chest, leg  Last BM:  4/1  Height:  Ht Readings from Last 1 Encounters:  07/20/20 '5\' 6"'$  (1.676 m)    Weight:  Wt Readings from Last 1 Encounters:  08/01/20 83.5 kg   Ideal Body Weight:  59.1 kg  BMI:  Body mass index is 29.71 kg/m.  Estimated Nutritional Needs:   Kcal:  1700-1900 kcal   Protein:  85-100 grams   Fluid:  >/= 1.7 L/day  Mariana Single RD, LDN Clinical Nutrition Pager listed in [place]

## 2020-08-01 NOTE — Progress Notes (Signed)
CARDIAC REHAB PHASE I   Returned to offer to walk with pt. Pt off floor for procedure. Will f/u tomorrow to encourage continued ambulation.  [name], RN BSN 08/01/2020 2:10 PM

## 2020-08-01 NOTE — Progress Notes (Signed)
Inpatient Diabetes Program Recommendations  AACE/ADA: New Consensus Statement on Inpatient Glycemic Control (2015)  Target Ranges:  Prepandial:   less than 140 mg/dL      Peak postprandial:   less than 180 mg/dL (1-2 hours)      Critically ill patients:  140 - 180 mg/dL   Results for Kristin [name], Kristin [name] (MRN [id number]) as of 08/01/2020 13:44  Ref. Range 07/31/2020 06:08 07/31/2020 11:25 07/31/2020 16:22 07/31/2020 21:24  Glucose-Capillary Latest Ref Range: 70 - 99 mg/dL 340 (H)  21 units NOVOLOG  305 (H)  21 units NOVOLOG  18 units LEVEMIR 123 (H)  7 units NOVOLOG  108 (H)   Results for Kristin [name], Kristin [name] (MRN [id number]) as of 08/01/2020 13:44  Ref. Range 08/01/2020 06:01 08/01/2020 10:59  Glucose-Capillary Latest Ref Range: 70 - 99 mg/dL 183 (H)  9 units NOVOLOG  259 (H)  17  units NOVOLOG  18 units LEVEMIR     Home DM Meds: Amaryl 1 mg Daily       Levemir 42 units Daily       Actos 30 mg Daily   Current Orders: Levemir 18 units Daily      Novolog 5 units TID with meals      Novolog 0-24 units TID     MD- Note Levemir increased yesterday AM.  AM CBG improved this AM.  Noted post-meal CBGs remain elevated today.  Please consider increasing the Novolog Meal Coverage to 8 units TID with meals    --Will follow patient during hospitalization--  Wyn Quaker RN, MSN, CDE Diabetes Coordinator Inpatient Glycemic Control Team Team Pager: [phone] (8a-5p)

## 2020-08-01 NOTE — Progress Notes (Signed)
CARDIAC REHAB PHASE I   Offered to walk with pt. Pt declines at this time, states she has had a rough morning. Pt discouraged as she feels like she is not making progress. Provided listening ear along with support and encouragement. Will f/u to continue to encourage ambulation.  [id number] [name], RN BSN 08/01/2020 10:30 AM

## 2020-08-01 NOTE — Progress Notes (Addendum)
Patient ID: AYN CARRITHERS, female   DOB: [birth year], 80 y.o.   MRN: [id number]    Cardiologist: Terrence Dupont  Advanced Heart Failure Rounding Note   Subjective:    - 3/24 NSTEMI - 3/24 Emergent CABG x 5 (LIMA to LAD, SEQ SVG to OM and Diagonal, SEQ to PL and PDA) - 3/25 take back to OR for tamponade. EF on TEE 55%, RV mildly down (3/25) -3/26 Extubated -3/28 Milrinone stopped. Midodrine started.  -3/30 S/P CRT-P.   Currently 12 lb above pre-op wt and notes slight dyspnea at rest. Also endorses orthopnea. CXR today w/ mild-mod left pleural effusion.    WBCs trending up, 9.2>>11.7. AF.   SCr stable at 1.7.  K 3.9    Objective:   Weight Range:  Vital Signs:   Temp:  [97.6 F (36.4 C)-99 F (37.2 C)] 97.6 F (36.4 C) (04/04 0746) Pulse Rate:  [40-95] 86 (04/04 0746) Resp:  [17-20] 17 (04/04 0746) BP: (114-127)/(49-58) 127/58 (04/04 0746) SpO2:  [90 %-98 %] 92 % (04/04 0746) Weight:  [83.5 kg] 83.5 kg (04/04 0425) Last BM Date: 07/29/20  Weight change: Filed Weights   07/28/20 0839 07/29/20 0400 08/01/20 0425  Weight: 79.4 kg 83.2 kg 83.5 kg    Intake/Output:   Intake/Output Summary (Last 24 hours) at 08/01/2020 0831 Last data filed at 07/31/2020 2117 Gross per 24 hour  Intake 243 ml  Output --  Net 243 ml      PHYSICAL EXAM: General:  fatigue appearing elderly WF sitting up in chair. No respiratory difficulty HEENT: normal Neck: supple. JVD 7 cm. Carotids 2+ bilat; no bruits. No lymphadenopathy or thyromegaly appreciated. Cor: PMI nondisplaced. Regular rate & rhythm. No rubs, gallops or murmurs. Sternotomy site and pacer sites stable  Lungs: decreased BS on the left, clear on the right  Abdomen: soft, nontender, nondistended. No hepatosplenomegaly. No bruits or masses. Good bowel sounds. Extremities: no cyanosis, clubbing, rash, edema Neuro: alert & oriented x 3, cranial nerves grossly intact. moves all 4 extremities w/o difficulty. Affect pleasant.  Telemetry:  BiV pacing 90s   Labs: Basic Metabolic Panel: Recent Labs  Lab 07/28/20 0059 07/29/20 0154 07/30/20 0114 07/31/20 0137 08/01/20 0411  NA 142 140 137 134* 139  K 3.9 3.6 3.7 3.9 3.9  CL 105 102 100 99 101  CO2 '31 30 27 28 27  '$ GLUCOSE 187* 144* 170* 380* 127*  BUN 33* 30* 28* 33* 28*  CREATININE 1.72* 1.63* 1.67* 1.83* 1.72*  CALCIUM 8.1* 8.1* 7.9* 7.8* 8.1*  MG 2.0 1.9 1.9 2.0 2.0    Liver Function Tests: Recent Labs  Lab 07/26/20 0357  AST 321*  ALT 938*  ALKPHOS 94  BILITOT 1.3*  PROT 4.2*  ALBUMIN 2.5*   No results for input(s): LIPASE, AMYLASE in the last 168 hours. No results for input(s): AMMONIA in the last 168 hours.  CBC: Recent Labs  Lab 07/28/20 0059 07/29/20 0154 07/30/20 0114 07/31/20 0137 08/01/20 0411  WBC 7.7 8.4 10.0 9.2 11.7*  HGB 9.4* 9.1* 9.1* 8.3* 8.5*  HCT 30.1* 28.8* 29.3* 26.3* 27.5*  MCV 99.3 100.7* 100.7* 99.2 103.0*  PLT 142* 204 244 282 346    Cardiac Enzymes: No results for input(s): CKTOTAL, CKMB, CKMBINDEX, TROPONINI in the last 168 hours.  BNP: BNP (last 3 results) No results for input(s): BNP in the last 8760 hours.  ProBNP (last 3 results) No results for input(s): PROBNP in the last 8760 hours.    Other results:  Imaging:  DG Chest 2 View  Result Date: 08/01/2020 CLINICAL DATA:  Right buttocks pain. EXAM: CHEST - 2 VIEW COMPARISON:  July 28, 2020. FINDINGS: Stable cardiomediastinal silhouette. Left-sided pacemaker is unchanged in position. Sternotomy wires are noted. Mild to moderate left pleural effusion is noted with associated left basilar atelectasis or infiltrate. Minimal right pleural effusion is noted. Bony thorax is unremarkable. IMPRESSION: Mild to moderate left pleural effusion is noted with associated left basilar atelectasis or infiltrate. Aortic Atherosclerosis (ICD10-I70.0). Electronically Signed   By: Marijo Conception M.D.   On: 08/01/2020 08:10     Medications:     Scheduled Medications: .  acetaminophen (TYLENOL) oral liquid 160 mg/5 mL  1,000 mg Oral Q6H   Or  . acetaminophen  1,000 mg Oral Q6H  . aspirin  81 mg Oral Daily  . atorvastatin  80 mg Oral q1800  . bisacodyl  10 mg Oral Daily   Or  . bisacodyl  10 mg Rectal Daily  . Chlorhexidine Gluconate Cloth  6 each Topical Daily  . clopidogrel  75 mg Oral Daily  . docusate  200 mg Oral Daily   Or  . docusate sodium  200 mg Oral Daily  . enoxaparin (LOVENOX) injection  30 mg Subcutaneous QHS  . feeding supplement  237 mL Oral TID BM  . insulin aspart  0-24 Units Subcutaneous TID WC  . insulin aspart  5 Units Subcutaneous TID WC  . insulin detemir  18 Units Subcutaneous Daily  . mouth rinse  15 mL Mouth Rinse BID  . megestrol  40 mg Oral Daily  . melatonin  3 mg Oral QHS  . midodrine  5 mg Oral TID WC  . multivitamin with minerals  1 tablet Oral Daily  . pantoprazole sodium  40 mg Oral Daily   Or  . pantoprazole  40 mg Oral Daily  . polyethylene glycol  17 g Oral Daily  . potassium chloride  20 mEq Oral Daily  . potassium chloride  20 mEq Oral Once  . QUEtiapine  50 mg Oral QHS  . sodium chloride flush  3 mL Intravenous Q12H    Infusions:   PRN Medications: acetaminophen, dextrose, guaiFENesin-dextromethorphan, magic mouthwash, metoprolol tartrate, ondansetron (ZOFRAN) IV, oxyCODONE, traMADol   Assessment/Plan:   1. Ischemic Cardiomyopathy, Cardiogenic shock - s/p CABG 3/24 with take back for tamponade on 3/25 - LVEF 55% on post-op TEE 3/25 - Off pressors. Off milrinone - continue midodrine 5 mg three times a day - volume status elevated, up 12 lb from pre-op wt + mod left pleural effusion on CXR - Give 40 mg IV Lasix - Repeat limited echo    2. CAD s/p NSTEMI  - CABG x 5 3/24 - no evidence of ongoing ischemia - continue DAPT, statin. No bb with shock.   3. Acute hypoxic respiratory failure post-op/ left pleural effusion  - Stable on room air but endorses mild dyspnea - CXR today w/ moderate  left pleural effusion  - Start IV Lasix today - if no improvement, may need thoracentesis  - also ? Aspiration given dysphagia w/ increasing WBC ct - continue to monitor WBC, may need empiric abx if continued increase   4. AKI - baseline creatinine 1.1 -> peaked at 2>1.4-> 1.7->1.7>1.63>1.8>1.7 - due to shock/ATN  5. Acute blood loss anemia, post -op - hgb stable at 8.5  6. Hypernatremia -resolved  7. Thrombocytopenia - Plts trending up 50> 64>79K.-> 107->142k>204>282>346  8. Dysphagia - Speech following on dysph 2  diet   9. Deconditioning - c/w PT/OT - insurance denied CIR  - Plan d/c to SNF when medically ready   10 CHB - EP consulted.  - S/P CRT-P-- 07/27/20   Length of Stay: [place]  PA-C 08/01/2020, 8:31 AM  Advanced Heart Failure Team Pager [phone] (M-F; 7a - 4p)  Please contact [facility] for night-coverage after hours (4p -7a ) and weekends on [email or web address]   Patient seen and examined with the above-signed Advanced Practice Provider and/or Housestaff. I personally reviewed laboratory data, imaging studies and relevant notes. I independently examined the patient and formulated the important aspects of the plan. I have edited the note to reflect any of my changes or salient points. I have personally discussed the plan with the patient and/or family.  More SOB today. + orthopnea. No CP. Weight up 12 pounds despite po abx. Creatinine stable.   CXR with moderate left effusion Personally reviewed  General:  Sitting in chair . No resp difficulty HEENT: normal Neck: supple. JVP up Carotids 2+ bilat; no bruits. No lymphadenopathy or thryomegaly appreciated. Cor: PMI nondisplaced. Regular rate & rhythm. No rubs, gallops or murmurs. sternotomy scar ok  Lungs: decreased on left Abdomen: soft, nontender, nondistended. No hepatosplenomegaly. No bruits or masses. Good bowel sounds. Extremities: no cyanosis, clubbing, rash, edema Neuro: alert &  orientedx3, cranial nerves grossly intact. moves all 4 extremities w/o difficulty. Affect pleasant  She is volume overloaded with moderate sized left effusion. Would use lasix 80 IV bid. F/u CXR in am. I suspect she will need tap.   [name], MD  1:19 PM

## 2020-08-02 ENCOUNTER — Inpatient Hospital Stay (HOSPITAL_COMMUNITY): Payer: Medicare HMO

## 2020-08-02 DIAGNOSIS — I214 Non-ST elevation (NSTEMI) myocardial infarction: Secondary | ICD-10-CM | POA: Diagnosis not present

## 2020-08-02 DIAGNOSIS — Z951 Presence of aortocoronary bypass graft: Secondary | ICD-10-CM | POA: Diagnosis not present

## 2020-08-02 DIAGNOSIS — J90 Pleural effusion, not elsewhere classified: Secondary | ICD-10-CM | POA: Diagnosis not present

## 2020-08-02 HISTORY — PX: IR THORACENTESIS ASP PLEURAL SPACE W/IMG GUIDE: IMG5380

## 2020-08-02 LAB — CBC
HCT: 27.6 % — ABNORMAL LOW (ref 36.0–46.0)
Hemoglobin: 8.6 g/dL — ABNORMAL LOW (ref 12.0–15.0)
MCH: 31.9 pg (ref 26.0–34.0)
MCHC: 31.2 g/dL (ref 30.0–36.0)
MCV: 102.2 fL — ABNORMAL HIGH (ref 80.0–100.0)
Platelets: 412 10*3/uL — ABNORMAL HIGH (ref 150–400)
RBC: 2.7 MIL/uL — ABNORMAL LOW (ref 3.87–5.11)
RDW: 18.7 % — ABNORMAL HIGH (ref 11.5–15.5)
WBC: 10.8 10*3/uL — ABNORMAL HIGH (ref 4.0–10.5)
nRBC: 0 % (ref 0.0–0.2)

## 2020-08-02 LAB — BASIC METABOLIC PANEL
Anion gap: 10 (ref 5–15)
BUN: 31 mg/dL — ABNORMAL HIGH (ref 8–23)
CO2: 28 mmol/L (ref 22–32)
Calcium: 8.2 mg/dL — ABNORMAL LOW (ref 8.9–10.3)
Chloride: 103 mmol/L (ref 98–111)
Creatinine, Ser: 1.71 mg/dL — ABNORMAL HIGH (ref 0.44–1.00)
GFR, Estimated: 30 mL/min — ABNORMAL LOW (ref >60)
Glucose, Bld: 88 mg/dL (ref 70–99)
Potassium: 4.4 mmol/L (ref 3.5–5.1)
Sodium: 141 mmol/L (ref 135–145)

## 2020-08-02 LAB — MAGNESIUM: Magnesium: 2 mg/dL (ref 1.7–2.4)

## 2020-08-02 LAB — GLUCOSE, CAPILLARY
Glucose-Capillary: 112 mg/dL — ABNORMAL HIGH (ref 70–99)
Glucose-Capillary: 239 mg/dL — ABNORMAL HIGH (ref 70–99)
Glucose-Capillary: 197 mg/dL — ABNORMAL HIGH (ref 70–99)
Glucose-Capillary: 206 mg/dL — ABNORMAL HIGH (ref 70–99)

## 2020-08-02 LAB — PROCALCITONIN: Procalcitonin: 2.13 ng/mL

## 2020-08-02 MED ORDER — SODIUM CHLORIDE 0.9 % IV SOLN
2.0000 g | Freq: Two times a day (BID) | INTRAVENOUS | Status: DC
  Administered 2020-08-02 – 2020-08-05 (×7): 2 g via INTRAVENOUS
  Filled 2020-08-02 (×7): qty 2

## 2020-08-02 MED ORDER — LIDOCAINE HCL 1 % IJ SOLN
INTRAMUSCULAR | Status: AC
  Filled 2020-08-02: qty 20

## 2020-08-02 MED ORDER — LIDOCAINE HCL 1 % IJ SOLN
INTRAMUSCULAR | Status: DC | PRN
  Administered 2020-08-02: 10 mL

## 2020-08-02 NOTE — TOC Progression Note (Signed)
Transition of Care (TOC) - Progression Note  Heart Failure   Patient Details  Name: Kristin [name] MRN: [id number] Date of Birth: 04/17/1941  Transition of Care [facility]) CM/SW [place], [place] Phone Number: 08/02/2020, 12:33 PM  Clinical Narrative:  CSW attempted to speak with patient at bedside regarding bed offers however the patient was not in her room. CSW called patients daughter Unice Cobble [phone] and informed her about [name]'s bed offers and the daughter decided to go with [facility] since it is so close to the daughters home. CSW attempted to start insurance authorization with [facility] but they reported that the SNF isn't in network. CSW called Arbie Cookey [phone] at [facility] to ask if she knew anything about [facility] HMO not being in network with the facility. Arbie Cookey reported that she would reach out to her contact at [facility] and get back with the CSW. CSW contacted [facility] and they reported they do not manage the patient. Arbie Cookey called the CSW back and reported that she was able to get a reference number [id number] with start date for tomorrow 08/03/2020 and asked for CSW to send her PT notes so she could fax them to the health insurance. CSW sent over the PT notes in the hub for [place]. Arbie Cookey with [employer] stated she would keep CSW in the loop regarding the insurance authorization.  TOC will continue to follow for d/c needs.    Expected Discharge Plan: [place] Barriers to Discharge: Continued Medical Work up  Expected Discharge Plan and Services Expected Discharge Plan: [place] In-house Referral: Clinical Social Work Discharge Planning Services: CM Consult Post Acute Care Choice: [place] arrangements for the past 2 months: Single Family Home                                       Social Determinants of Health (SDOH)  Interventions    Readmission Risk Interventions No flowsheet data found.  Edee Nifong, MSW, [place] Heart Failure Social Worker

## 2020-08-02 NOTE — Progress Notes (Signed)
  Speech Language Pathology Treatment: Dysphagia  Patient Details Name: Kristin [name] MRN: [id number] DOB: Mar 11, 1941 Today's Date: 08/02/2020 Time: [id number] SLP Time Calculation (min) (ACUTE ONLY): 18 min  Assessment / Plan / Recommendation Clinical Impression  Pt stated that she "couldn't handle much more today" but was agreeable to PO intake with SLP. Intermittent throat clearing was noted throughout all PO trials as well as without, but this has been noted across all visits with this pt. One cough occurred while she was eating a bite of graham cracker but with all previous trials of solids she demonstrated no overt difficulty. She also reports that her throat feels better and that she is tired of DYS 2 solids. She reports that will wear dentures when she is back home but does not want to wear them while at the hospital. Therefore, she would prefer softer solids while here and would be agreeable to regular solids with her dentures after she leaves the hospital. Given these findings, recommend an upgraded diet to DYS 3 solids and thin liquids. SLP will continue to follow.    HPI HPI: Pt is a 80 yo female admitted with NSTEMI, cadiogenic shock s/p emergent CABG x5 on 3/24. She returned to the OR on 3/25 for tamponade requiring evacuation of hematoma. ETT 3/24-3/26. PMH includes DM, obesity, HTN, HL      SLP Plan  Continue with current plan of care       Recommendations  Diet recommendations: Dysphagia 3 (mechanical soft);Thin liquid Liquids provided via: Cup;Straw Medication Administration: Whole meds with puree (crushed if larger) Supervision: Patient able to self feed;Intermittent supervision to cue for compensatory strategies Compensations: Slow rate;Small sips/bites Postural Changes and/or Swallow Maneuvers: Seated upright 90 degrees                Oral Care Recommendations: Oral care BID Follow up Recommendations: Skilled Nursing facility;24 hour supervision/assistance SLP  Visit Diagnosis: Dysphagia, unspecified (R13.10) Plan: Continue with current plan of care       GO                Jeanine Luz., SLP Student 08/02/2020, 3:00 PM

## 2020-08-02 NOTE — Progress Notes (Signed)
Physical Therapy Treatment Patient Details Name: Kristin [name] MRN: [id number] DOB: [birth year] Today's Date: 08/02/2020    History of Present Illness Kristin [name] is a 80 y/o female admitted 3/23 from [place] with emergent cath then to ICU on 07/21/20. Pt with NSTEMI and underwent CABGx5 on 07/22/20. Post-op tamponade requiring return to OR on 3/25 for hematoma evacuation and then extubated on 3/26. Underwent BIV pacemaker insertion 3/30. PMH includes DM,HTN, HLD, and history of gallbladder surgery.    PT Comments    Pt received in supine, c/o fatigue and discomfort post-thoracentesis but agreeable to therapy session and transfer training with max encouragement. Pt with fair tolerance for bed mobility and transfer training and performed BLE exercises with good tolerance, needs cues throughout mobility tasks for compliance with sternal precautions. Encouraged pt to sit up in chair at least 1-2 hours post-session. Will plan to progress gait distance next session. Pt continues to benefit from PT services to progress toward functional mobility goals. Continue to recommend post-acute rehab.   Follow Up Recommendations  CIR;Supervision for mobility/OOB     Equipment Recommendations  Rolling walker with 5" wheels;3in1 (PT)    Recommendations for Other Services       Precautions / Restrictions Precautions Precautions: Sternal;ICD/Pacemaker Precaution Booklet Issued: Yes (comment) Precaution Comments: pacemaker placement 3/30 Restrictions Weight Bearing Restrictions: No Other Position/Activity Restrictions: sternal precs, PPM precs but allowed to WBAT with elbows tucked in    Mobility  Bed Mobility Overal bed mobility: Needs Assistance Bed Mobility: Rolling;Sidelying to Sit;Sit to Sidelying Rolling: Min assist Sidelying to sit: Mod assist       General bed mobility comments: VC's for sternal precautions and log roll sequencing, needs tactile cues for trunk rise/lowering to prevent winging  elbows out and not to extend L shoulder when repositioning in bed/chair    Transfers Overall transfer level: Needs assistance Equipment used: Rolling walker (2 wheeled) Transfers: Sit to/from Stand Sit to Stand: Mod assist Stand pivot transfers: Min assist;+2 physical assistance       General transfer comment: cues for sternal precautions, assist to power up and stabilize balance, cues for proximity to RW while pivoting  Ambulation/Gait Ambulation/Gait assistance: Min assist Gait Distance (Feet): 3 Feet Assistive device: Rolling walker (2 wheeled) Gait Pattern/deviations: Step-through pattern Gait velocity: decreased   General Gait Details: sidesteps toward chair   Stairs             Wheelchair Mobility    Modified Rankin (Stroke Patients Only)       Balance Overall balance assessment: Needs assistance Sitting-balance support: Feet supported;No upper extremity supported Sitting balance-Leahy Scale: Fair Sitting balance - Comments: able to sit with LUE in lap and RUE support   Standing balance support: Bilateral upper extremity supported;During functional activity Standing balance-Leahy Scale: Poor Standing balance comment: reliant on RW and external assist                            Cognition Arousal/Alertness: Awake/alert Behavior During Therapy: [facility] for tasks assessed/performed;Anxious;Flat affect Overall Cognitive Status: No family/caregiver present to determine baseline cognitive functioning                                 General Comments: moderately anxious regarding mobility, initially refusing but with instruction on benefits, agreeable to attempt      Exercises Other Exercises Other Exercises: supine BLE AROM: AP, heel slides, hip  abduction (some AA needed for proper form) x10 reps ea Other Exercises: Seated BLE AROM: hip flexion, LAQ x10 reps ea    General Comments General comments (skin integrity, edema, etc.): SpO2  WNL on 1L O2 [place] with transfers and seated rest break, remained on 1L at end of session RN notified; VSS on 1L      Pertinent Vitals/Pain Pain Assessment: 0-10 Pain Score: 5  Faces Pain Scale: No hurt Pain Location: LUE PPM incision>sternal incision Pain Descriptors / Indicators: Discomfort;Guarding;Grimacing Pain Intervention(s): Limited activity within patient's tolerance;Monitored during session;Repositioned    Home Living                      Prior Function            PT Goals (current goals can now be found in the care plan section) Acute Rehab PT Goals Patient Stated Goal: move a little better so I can go home PT Goal Formulation: With patient Time For Goal Achievement: 08/09/20 Potential to Achieve Goals: Good Progress towards PT goals: Progressing toward goals    Frequency    Min 3X/week      PT Plan Current plan remains appropriate    Co-evaluation              AM-PAC PT "6 Clicks" Mobility   Outcome Measure  Help needed turning from your back to your side while in a flat bed without using bedrails?: A Little Help needed moving from lying on your back to sitting on the side of a flat bed without using bedrails?: A Lot Help needed moving to and from a bed to a chair (including a wheelchair)?: A Lot Help needed standing up from a chair using your arms (e.g., wheelchair or bedside chair)?: A Lot Help needed to walk in hospital room?: A Little Help needed climbing 3-5 steps with a railing? : A Lot 6 Click Score: 14    End of Session   Activity Tolerance: Patient limited by fatigue;Patient limited by pain Patient left: with call bell/phone within reach;in chair (assisted pt to dial # to order dinner) Nurse Communication: Mobility status PT Visit Diagnosis: Unsteadiness on feet (R26.81);Muscle weakness (generalized) (M62.81);Difficulty in walking, not elsewhere classified (R26.2)     Time: [id number] PT Time Calculation (min) (ACUTE ONLY): 19  min  Charges:  $Therapeutic Activity: 8-22 mins                     Colman Birdwell P., PTA Acute Rehabilitation Services Pager: [phone] Office: [place] 08/02/2020, 5:36 PM

## 2020-08-02 NOTE — Progress Notes (Signed)
CARDIAC REHAB PHASE I   PRE:  Rate/Rhythm: 70 SR  BP:  Sitting: 116/57      SaO2: 90 RA  MODE:  Ambulation: 50 ft x2   POST:  Rate/Rhythm: 89 SR  BP:  Sitting: 109/44    SaO2: 80 RA --> 97 2L   After some encouragement, pt agreeable to ambulate. Pt took 2 tries to stand, then able to ambulate ~184f in hallway with several short standing rest breaks. Pt c/o back and nausea upon return to room, RN aware. Pt coached through purse lipped breathing. Support and encouragement provided. Encouraged continued ambulation and IS use. Will continue to follow.  [id number] Falco RN BSN 08/02/2020 9:45 AM

## 2020-08-02 NOTE — Progress Notes (Addendum)
Patient ID: Kristin [name], female   DOB: 15-Oct-1940, 80 y.o.   MRN: [id number]    Cardiologist: Terrence Dupont  Advanced Heart Failure Rounding Note   Subjective:    - 3/24 NSTEMI - 3/24 Emergent CABG x 5 (LIMA to LAD, SEQ SVG to OM and Diagonal, SEQ to PL and PDA) - 3/25 take back to OR for tamponade. EF on TEE 55%, RV mildly down (3/25) -3/26 Extubated -3/28 Milrinone stopped. Midodrine started.  -3/30 S/P CRT-P.   Started back on IV lasix yesterday has received 40am, 80pm for weight gain and another 80 this am for increased dyspnea, increased LLL effusion.  SCr stable at 1.7.  Seen just before going for thoracentesis.  Feels shortness of breath has not improved much if any.  Upset and depressed today.  Difficult to tell how much output no weights and incomplete I's/O's recorded.      Objective:   Weight Range:  Vital Signs:   Temp:  [97.6 F (36.4 C)-99.3 F (37.4 C)] 97.6 F (36.4 C) (04/05 0738) Pulse Rate:  [78-95] 95 (04/05 0738) Resp:  [16-19] 17 (04/05 0738) BP: (108-128)/(52-73) 108/52 (04/05 0738) SpO2:  [90 %-96 %] 92 % (04/05 0738) Last BM Date: 08/01/20  Weight change: Filed Weights   07/28/20 0839 07/29/20 0400 08/01/20 0425  Weight: 79.4 kg 83.2 kg 83.5 kg    Intake/Output:   Intake/Output Summary (Last 24 hours) at 08/02/2020 1024 Last data filed at 08/01/2020 1827 Gross per 24 hour  Intake --  Output 1300 ml  Net -1300 ml      PHYSICAL EXAM: Cardiac: JVD 11, normal rate and rhythm, clear s1 and s2, no murmurs, rubs or gallops, non pitting LE edema bilaterally Pulmonary: decreased bs LLL, shallow breaths, not in distress Abdominal: non distended abdomen, soft and nontender Psych: Alert, conversant, depressed    Telemetry: BiV pacing 80's-90s   Labs: Basic Metabolic Panel: Recent Labs  Lab 07/29/20 0154 07/30/20 0114 07/31/20 0137 08/01/20 0411 08/02/20 0145  NA 140 137 134* 139 141  K 3.6 3.7 3.9 3.9 4.4  CL 102 100 99 101 103  CO2 '30 27  28 27 28  '$ GLUCOSE 144* 170* 380* 127* 88  BUN 30* 28* 33* 28* 31*  CREATININE 1.63* 1.67* 1.83* 1.72* 1.71*  CALCIUM 8.1* 7.9* 7.8* 8.1* 8.2*  MG 1.9 1.9 2.0 2.0 2.0    Liver Function Tests: No results for input(s): AST, ALT, ALKPHOS, BILITOT, PROT, ALBUMIN in the last 168 hours. No results for input(s): LIPASE, AMYLASE in the last 168 hours. No results for input(s): AMMONIA in the last 168 hours.  CBC: Recent Labs  Lab 07/28/20 0059 07/29/20 0154 07/30/20 0114 07/31/20 0137 08/01/20 0411  WBC 7.7 8.4 10.0 9.2 11.7*  HGB 9.4* 9.1* 9.1* 8.3* 8.5*  HCT 30.1* 28.8* 29.3* 26.3* 27.5*  MCV 99.3 100.7* 100.7* 99.2 103.0*  PLT 142* 204 244 282 346    Cardiac Enzymes: No results for input(s): CKTOTAL, CKMB, CKMBINDEX, TROPONINI in the last 168 hours.  BNP: BNP (last 3 results) No results for input(s): BNP in the last 8760 hours.  ProBNP (last 3 results) No results for input(s): PROBNP in the last 8760 hours.    Other results:  Imaging: DG Chest 2 View  Result Date: 08/01/2020 CLINICAL DATA:  Right buttocks pain. EXAM: CHEST - 2 VIEW COMPARISON:  July 28, 2020. FINDINGS: Stable cardiomediastinal silhouette. Left-sided pacemaker is unchanged in position. Sternotomy wires are noted. Mild to moderate left pleural effusion is  noted with associated left basilar atelectasis or infiltrate. Minimal right pleural effusion is noted. Bony thorax is unremarkable. IMPRESSION: Mild to moderate left pleural effusion is noted with associated left basilar atelectasis or infiltrate. Aortic Atherosclerosis (ICD10-I70.0). Electronically Signed   By: Marijo Conception M.D.   On: 08/01/2020 08:10   ECHOCARDIOGRAM LIMITED  Result Date: 08/01/2020    ECHOCARDIOGRAM LIMITED REPORT   Patient Name:   Kristin [name] Date of Exam: 08/01/2020 Medical Rec #:  [id number]       Height:       66.0 in Accession #:    [id number]      Weight:       184.1 lb Date of Birth:  1940-11-20       BSA:          1.931 m  Patient Age:    23 years        BP:           112/56 mmHg Patient Gender: F               HR:           94 bpm. Exam Location:  Inpatient Procedure: Limited Echo, Color Doppler and Cardiac Doppler Indications:    Congestive Heart Failure I50.9  History:        Patient has prior history of Echocardiogram examinations, most                 recent 07/25/2020. Prior CABG; Risk Factors:Hypertension and                 Diabetes.  Sonographer:    Mikki Santee RDCS (AE) Referring Phys: [place]  1. Inferior basal septal distal anterior wall hypokinesis consistent with multi vessel CAD. Left ventricular ejection fraction, by estimation, is 40 to 45%. The left ventricle has mildly decreased function. The left ventricle demonstrates regional wall motion abnormalities (see scoring diagram/findings for description). The left ventricular internal cavity size was moderately dilated. Left ventricular diastolic parameters are consistent with Grade I diastolic dysfunction (impaired relaxation).  2. ? catheters in RA/RV . Right ventricular systolic function is normal. The right ventricular size is normal. There is normal pulmonary artery systolic pressure.  3. Left atrial size was mildly dilated.  4. Right atrial size was mildly dilated.  5. A small pericardial effusion is present. The pericardial effusion is lateral to the left ventricle and posterior to the left ventricle. Moderate pleural effusion in the left lateral region.  6. The mitral valve is abnormal. Mild mitral valve regurgitation. No evidence of mitral stenosis.  7. Tricuspid valve regurgitation is moderate.  8. The aortic valve is normal in structure. Aortic valve regurgitation is trivial. No aortic stenosis is present.  9. The inferior vena cava is dilated in size with >50% respiratory variability, suggesting right atrial pressure of 8 mmHg. FINDINGS  Left Ventricle: Inferior basal septal distal anterior wall hypokinesis consistent with  multi vessel CAD. Left ventricular ejection fraction, by estimation, is 40 to 45%. The left ventricle has mildly decreased function. The left ventricle demonstrates regional wall motion abnormalities. The left ventricular internal cavity size was moderately dilated. There is no left ventricular hypertrophy. Left ventricular diastolic parameters are consistent with Grade I diastolic dysfunction (impaired relaxation). Right Ventricle: ? catheters in RA/RV. The right ventricular size is normal. No increase in right ventricular wall thickness. Right ventricular systolic function is normal. There is normal pulmonary artery systolic pressure. The tricuspid regurgitant  velocity is 2.29 m/s, and with an assumed right atrial pressure of 8 mmHg, the estimated right ventricular systolic pressure is 0000000 mmHg. Left Atrium: Left atrial size was mildly dilated. Right Atrium: Right atrial size was mildly dilated. Pericardium: A small pericardial effusion is present. The pericardial effusion is lateral to the left ventricle and posterior to the left ventricle. Mitral Valve: The mitral valve is abnormal. There is mild thickening of the mitral valve leaflet(s). There is mild calcification of the mitral valve leaflet(s). Mild mitral valve regurgitation. No evidence of mitral valve stenosis. Tricuspid Valve: The tricuspid valve is normal in structure. Tricuspid valve regurgitation is moderate . No evidence of tricuspid stenosis. Aortic Valve: The aortic valve is normal in structure. Aortic valve regurgitation is trivial. No aortic stenosis is present. Pulmonic Valve: The pulmonic valve was normal in structure. Pulmonic valve regurgitation is not visualized. No evidence of pulmonic stenosis. Aorta: The aortic root is normal in size and structure. Venous: The inferior vena cava is dilated in size with greater than 50% respiratory variability, suggesting right atrial pressure of 8 mmHg. IAS/Shunts: No atrial level shunt detected by color  flow Doppler. Additional Comments: There is a moderate pleural effusion in the left lateral region.  Diastology LV e' medial:    6.09 cm/s LV E/e' medial:  12.4 LV e' lateral:   6.53 cm/s LV E/e' lateral: 11.5  LEFT ATRIUM             Index LA Vol (A2C):   80.7 ml 41.80 ml/m LA Vol (A4C):   19.8 ml 10.26 ml/m LA Biplane Vol: 41.1 ml 21.29 ml/m  MITRAL VALVE                TRICUSPID VALVE MV Area (PHT): 4.10 cm     TR Peak grad:   21.0 mmHg MV Decel Time: 185 msec     TR Vmax:        229.00 cm/s MV E velocity: 75.30 cm/s MV A velocity: 105.00 cm/s MV E/A ratio:  0.72 Jenkins Rouge MD Electronically signed by Jenkins Rouge MD Signature Date/Time: 08/01/2020/3:02:23 PM    Final      Medications:     Scheduled Medications: . acetaminophen (TYLENOL) oral liquid 160 mg/5 mL  1,000 mg Oral Q6H   Or  . acetaminophen  1,000 mg Oral Q6H  . aspirin  81 mg Oral Daily  . atorvastatin  80 mg Oral q1800  . bisacodyl  10 mg Oral Daily   Or  . bisacodyl  10 mg Rectal Daily  . Chlorhexidine Gluconate Cloth  6 each Topical Daily  . clopidogrel  75 mg Oral Daily  . docusate  200 mg Oral Daily   Or  . docusate sodium  200 mg Oral Daily  . enoxaparin (LOVENOX) injection  30 mg Subcutaneous QHS  . feeding supplement  237 mL Oral TID BM  . furosemide  80 mg Intravenous BID  . insulin aspart  0-24 Units Subcutaneous TID WC  . insulin aspart  5 Units Subcutaneous TID WC  . insulin detemir  18 Units Subcutaneous Daily  . mouth rinse  15 mL Mouth Rinse BID  . megestrol  40 mg Oral Daily  . melatonin  3 mg Oral QHS  . midodrine  5 mg Oral TID WC  . multivitamin with minerals  1 tablet Oral Daily  . pantoprazole sodium  40 mg Oral Daily   Or  . pantoprazole  40 mg Oral Daily  . polyethylene  glycol  17 g Oral Daily  . potassium chloride  20 mEq Oral BID  . QUEtiapine  50 mg Oral QHS  . sodium chloride flush  3 mL Intravenous Q12H    Infusions:   PRN Medications: acetaminophen, dextrose,  guaiFENesin-dextromethorphan, magic mouthwash, metoprolol tartrate, ondansetron (ZOFRAN) IV, oxyCODONE, traMADol   Assessment/Plan:   1. Ischemic Cardiomyopathy, Cardiogenic shock - s/p CABG 3/24 with take back for tamponade on 3/25 - LVEF 55% on post-op TEE 3/25 - Off pressors. Off milrinone - continue midodrine 5 mg three times a day - volume status elevated, up 12 lb from pre-op wt + mod left pleural effusion on CXR - started back on IV lasix 08/01/2020 now on 80 BID - Repeat limited echo on 08/01/2020 with EF 40-45%  2. CAD s/p NSTEMI  - CABG x 5 3/24 - no evidence of ongoing ischemia - continue DAPT, statin. No bb with shock.   3. Acute hypoxic respiratory failure post-op/ left pleural effusion  - Stable on room air but endorses mild dyspnea - CXR w/ moderate left pleural effusion  - Continue IV lasix - she has been aspirating, white count going up, no cbc today will order also will get procalcitonin - left thoracentesis today - Needs to use IS  4. AKI - baseline creatinine 1.1 -> peaked at 2>1.4-> 1.7->1.7>1.63>1.8>1.7 - due to shock/ATN - stable with diuresis  5. Acute blood loss anemia, post -op - get cbc  6. Hypernatremia -resolved  7. Thrombocytopenia - Plts trending up 50> 64>79K.-> 107->142k>204>282>346  8. Dysphagia - Speech following on dysph 2 diet  9. Deconditioning - c/w PT/OT - insurance denied CIR  - Plan d/c to SNF when medically ready   10 CHB - EP consulted.  - S/P CRT-P-- 07/27/20   Length of Stay: [address]   Katherine Roan  PA-C 08/02/2020, 10:24 AM  Advanced Heart Failure Team Pager [phone] (M-F; 7a - 4p)  Please contact [facility] for night-coverage after hours (4p -7a ) and weekends on [email or web address]  Patient seen and examined with the above-signed Advanced Practice Provider and/or Housestaff. I personally reviewed laboratory data, imaging studies and relevant notes. I independently examined the patient and formulated the important  aspects of the plan. I have edited the note to reflect any of my changes or salient points. I have personally discussed the plan with the patient and/or family.  More SOB this am despite IV lasix.  Low-grade temps. Underwent left thora today with 1.2 L out. PCT 2.1  General:  Weak appearing. No resp difficulty HEENT: normal Neck: supple. JVP u . Carotids 2+ bilat; no bruits. No lymphadenopathy or thryomegaly appreciated. Cor: PMI nondisplaced. Regular rate & rhythm. No rubs, gallops or murmurs. Lungs: decreased at bases Abdomen: soft, nontender, nondistended. No hepatosplenomegaly. No bruits or masses. Good bowel sounds. Extremities: no cyanosis, clubbing, rash, edema Neuro: alert & orientedx3, cranial nerves grossly intact. moves all 4 extremities w/o difficulty. Affect pleasant  Remains symptomatic. Hopefully will improve after thora. With low grade temps and elevated PCT wil start cefepime. Encourage pulmonary toilet. Can consider chest CT as needed.   [name], MD  2:10 PM

## 2020-08-02 NOTE — Procedures (Signed)
PROCEDURE SUMMARY:  Successful US guided right thoracentesis. Yielded 1.2 L of serosanguinous fluid. Pt tolerated procedure well. No immediate complications.  Specimen was not sent for labs. CXR ordered.  EBL < 5 mL  Ascencion Dike PA-C 08/02/2020 11:25 AM

## 2020-08-02 NOTE — Progress Notes (Addendum)
Kristin [name] [address]       ,[place] [id number]             [phone]      6 Days Post-Op Procedure(s) (LRB): BIV PACEMAKER INSERTION CRT-P (N/A) Subjective: C/O SOB  Objective: Vital signs in last 24 hours: Temp:  [97.6 F (36.4 C)-99.3 F (37.4 C)] 97.6 F (36.4 C) (04/05 0738) Pulse Rate:  [78-95] 95 (04/05 0738) Cardiac Rhythm: Ventricular paced;Heart block;Bundle branch block (04/04 1925) Resp:  [16-19] 17 (04/05 0738) BP: (108-128)/(52-73) 108/52 (04/05 0738) SpO2:  [90 %-96 %] 92 % (04/05 0738)  Hemodynamic parameters for last 24 hours:    Intake/Output from previous day: 04/04 0701 - 04/05 0700 In: -  Out: 1300 [Urine:1300] Intake/Output this shift: No intake/output data recorded.  General appearance: alert, cooperative and no distress Heart: regular rate and rhythm Lungs: dim left base Abdomen: benign Extremities: mo edema Wound: incis healing well  Lab Results: Recent Labs    07/31/20 0137 08/01/20 0411  WBC 9.2 11.7*  HGB 8.3* 8.5*  HCT 26.3* 27.5*  PLT 282 346   BMET:  Recent Labs    08/01/20 0411 08/02/20 0145  NA 139 141  K 3.9 4.4  CL 101 103  CO2 27 28  GLUCOSE 127* 88  BUN 28* 31*  CREATININE 1.72* 1.71*  CALCIUM 8.1* 8.2*    PT/INR: No results for input(s): LABPROT, INR in the last 72 hours. ABG    Component Value Date/Time   PHART 7.442 07/23/2020 0434   HCO3 29.2 (H) 07/23/2020 0434   TCO2 31 07/23/2020 0434   ACIDBASEDEF 1.0 07/22/2020 0814   O2SAT 59.9 07/26/2020 0357   CBG (last 3)  Recent Labs    08/01/20 1544 08/01/20 2122 08/02/20 0620  GLUCAP 209* 80 112*    Meds Scheduled Meds: . acetaminophen (TYLENOL) oral liquid 160 mg/5 mL  1,000 mg Oral Q6H   Or  . acetaminophen  1,000 mg Oral Q6H  . aspirin  81 mg Oral Daily  . atorvastatin  80 mg Oral q1800  . bisacodyl  10 mg Oral Daily   Or  . bisacodyl  10 mg Rectal Daily  . Chlorhexidine Gluconate Cloth  6 each Topical Daily  .  clopidogrel  75 mg Oral Daily  . docusate  200 mg Oral Daily   Or  . docusate sodium  200 mg Oral Daily  . enoxaparin (LOVENOX) injection  30 mg Subcutaneous QHS  . feeding supplement  237 mL Oral TID BM  . furosemide  80 mg Intravenous BID  . insulin aspart  0-24 Units Subcutaneous TID WC  . insulin aspart  5 Units Subcutaneous TID WC  . insulin detemir  18 Units Subcutaneous Daily  . mouth rinse  15 mL Mouth Rinse BID  . megestrol  40 mg Oral Daily  . melatonin  3 mg Oral QHS  . midodrine  5 mg Oral TID WC  . multivitamin with minerals  1 tablet Oral Daily  . pantoprazole sodium  40 mg Oral Daily   Or  . pantoprazole  40 mg Oral Daily  . polyethylene glycol  17 g Oral Daily  . potassium chloride  20 mEq Oral BID  . QUEtiapine  50 mg Oral QHS  . sodium chloride flush  3 mL Intravenous Q12H   Continuous Infusions: PRN Meds:.acetaminophen, dextrose, guaiFENesin-dextromethorphan, magic mouthwash, metoprolol tartrate, ondansetron (ZOFRAN) IV, oxyCODONE, traMADol  Xrays DG Chest 2 View  Result  Date: 08/01/2020 CLINICAL DATA:  Right buttocks pain. EXAM: CHEST - 2 VIEW COMPARISON:  July 28, 2020. FINDINGS: Stable cardiomediastinal silhouette. Left-sided pacemaker is unchanged in position. Sternotomy wires are noted. Mild to moderate left pleural effusion is noted with associated left basilar atelectasis or infiltrate. Minimal right pleural effusion is noted. Bony thorax is unremarkable. IMPRESSION: Mild to moderate left pleural effusion is noted with associated left basilar atelectasis or infiltrate. Aortic Atherosclerosis (ICD10-I70.0). Electronically Signed   By: Marijo Conception M.D.   On: 08/01/2020 08:10   ECHOCARDIOGRAM LIMITED  Result Date: 08/01/2020    ECHOCARDIOGRAM LIMITED REPORT   Patient Name:   Kristin [name] Date of Exam: 08/01/2020 Medical Rec #:  [id number]       Height:       66.0 in Accession #:    [id number]      Weight:       184.1 lb Date of Birth:  1940-07-28       BSA:           1.931 m Patient Age:    3 years        BP:           112/56 mmHg Patient Gender: F               HR:           94 bpm. Exam Location:  Inpatient Procedure: Limited Echo, Color Doppler and Cardiac Doppler Indications:    Congestive Heart Failure I50.9  History:        Patient has prior history of Echocardiogram examinations, most                 recent 07/25/2020. Prior CABG; Risk Factors:Hypertension and                 Diabetes.  Sonographer:    Mikki Santee RDCS (AE) Referring Phys: [place]  1. Inferior basal septal distal anterior wall hypokinesis consistent with multi vessel CAD. Left ventricular ejection fraction, by estimation, is 40 to 45%. The left ventricle has mildly decreased function. The left ventricle demonstrates regional wall motion abnormalities (see scoring diagram/findings for description). The left ventricular internal cavity size was moderately dilated. Left ventricular diastolic parameters are consistent with Grade I diastolic dysfunction (impaired relaxation).  2. ? catheters in RA/RV . Right ventricular systolic function is normal. The right ventricular size is normal. There is normal pulmonary artery systolic pressure.  3. Left atrial size was mildly dilated.  4. Right atrial size was mildly dilated.  5. A small pericardial effusion is present. The pericardial effusion is lateral to the left ventricle and posterior to the left ventricle. Moderate pleural effusion in the left lateral region.  6. The mitral valve is abnormal. Mild mitral valve regurgitation. No evidence of mitral stenosis.  7. Tricuspid valve regurgitation is moderate.  8. The aortic valve is normal in structure. Aortic valve regurgitation is trivial. No aortic stenosis is present.  9. The inferior vena cava is dilated in size with >50% respiratory variability, suggesting right atrial pressure of 8 mmHg. FINDINGS  Left Ventricle: Inferior basal septal distal anterior wall hypokinesis  consistent with multi vessel CAD. Left ventricular ejection fraction, by estimation, is 40 to 45%. The left ventricle has mildly decreased function. The left ventricle demonstrates regional wall motion abnormalities. The left ventricular internal cavity size was moderately dilated. There is no left ventricular hypertrophy. Left ventricular diastolic parameters are consistent with Grade I  diastolic dysfunction (impaired relaxation). Right Ventricle: ? catheters in RA/RV. The right ventricular size is normal. No increase in right ventricular wall thickness. Right ventricular systolic function is normal. There is normal pulmonary artery systolic pressure. The tricuspid regurgitant velocity is 2.29 m/s, and with an assumed right atrial pressure of 8 mmHg, the estimated right ventricular systolic pressure is 0000000 mmHg. Left Atrium: Left atrial size was mildly dilated. Right Atrium: Right atrial size was mildly dilated. Pericardium: A small pericardial effusion is present. The pericardial effusion is lateral to the left ventricle and posterior to the left ventricle. Mitral Valve: The mitral valve is abnormal. There is mild thickening of the mitral valve leaflet(s). There is mild calcification of the mitral valve leaflet(s). Mild mitral valve regurgitation. No evidence of mitral valve stenosis. Tricuspid Valve: The tricuspid valve is normal in structure. Tricuspid valve regurgitation is moderate . No evidence of tricuspid stenosis. Aortic Valve: The aortic valve is normal in structure. Aortic valve regurgitation is trivial. No aortic stenosis is present. Pulmonic Valve: The pulmonic valve was normal in structure. Pulmonic valve regurgitation is not visualized. No evidence of pulmonic stenosis. Aorta: The aortic root is normal in size and structure. Venous: The inferior vena cava is dilated in size with greater than 50% respiratory variability, suggesting right atrial pressure of 8 mmHg. IAS/Shunts: No atrial level shunt  detected by color flow Doppler. Additional Comments: There is a moderate pleural effusion in the left lateral region.  Diastology LV e' medial:    6.09 cm/s LV E/e' medial:  12.4 LV e' lateral:   6.53 cm/s LV E/e' lateral: 11.5  LEFT ATRIUM             Index LA Vol (A2C):   80.7 ml 41.80 ml/m LA Vol (A4C):   19.8 ml 10.26 ml/m LA Biplane Vol: 41.1 ml 21.29 ml/m  MITRAL VALVE                TRICUSPID VALVE MV Area (PHT): 4.10 cm     TR Peak grad:   21.0 mmHg MV Decel Time: 185 msec     TR Vmax:        229.00 cm/s MV E velocity: 75.30 cm/s MV A velocity: 105.00 cm/s MV E/A ratio:  0.72 Jenkins Rouge MD Electronically signed by Jenkins Rouge MD Signature Date/Time: 08/01/2020/3:02:23 PM    Final     Assessment/Plan: S/P Procedure(s) (LRB): BIV PACEMAKER INSERTION CRT-P (N/A)   1 Tmax 99.3, VSS, paced  2 sats ok on 2 liters 3 creat remains stable 4 BS control is reasonable , one reading >200 5 UA appears pretty unremarkable, trace leukocytes, 6-10 WBC 6 mod Left effusion on CXR- may need to consider thoracentesis with increasing SOB 7 TOC involved with SNF bed search      LOS: 13 days    John Giovanni 08/02/2020 Pt seen and examined; agree with documentation. Plan Left thoracentesis. Continue d/c planning Danyl Deems Z. Orvan Seen, [place]

## 2020-08-03 DIAGNOSIS — Z951 Presence of aortocoronary bypass graft: Secondary | ICD-10-CM | POA: Diagnosis not present

## 2020-08-03 DIAGNOSIS — I5021 Acute systolic (congestive) heart failure: Secondary | ICD-10-CM

## 2020-08-03 DIAGNOSIS — I214 Non-ST elevation (NSTEMI) myocardial infarction: Secondary | ICD-10-CM | POA: Diagnosis not present

## 2020-08-03 LAB — CBC
HCT: 25.9 % — ABNORMAL LOW (ref 36.0–46.0)
Hemoglobin: 8.2 g/dL — ABNORMAL LOW (ref 12.0–15.0)
MCH: 32.2 pg (ref 26.0–34.0)
MCHC: 31.7 g/dL (ref 30.0–36.0)
MCV: 101.6 fL — ABNORMAL HIGH (ref 80.0–100.0)
Platelets: 394 10*3/uL (ref 150–400)
RBC: 2.55 MIL/uL — ABNORMAL LOW (ref 3.87–5.11)
RDW: 18.5 % — ABNORMAL HIGH (ref 11.5–15.5)
WBC: 9 10*3/uL (ref 4.0–10.5)
nRBC: 0.2 % (ref 0.0–0.2)

## 2020-08-03 LAB — BASIC METABOLIC PANEL
Anion gap: 8 (ref 5–15)
BUN: 34 mg/dL — ABNORMAL HIGH (ref 8–23)
CO2: 27 mmol/L (ref 22–32)
Calcium: 7.9 mg/dL — ABNORMAL LOW (ref 8.9–10.3)
Chloride: 101 mmol/L (ref 98–111)
Creatinine, Ser: 1.81 mg/dL — ABNORMAL HIGH (ref 0.44–1.00)
GFR, Estimated: 28 mL/min — ABNORMAL LOW (ref >60)
Glucose, Bld: 265 mg/dL — ABNORMAL HIGH (ref 70–99)
Potassium: 4.3 mmol/L (ref 3.5–5.1)
Sodium: 136 mmol/L (ref 135–145)

## 2020-08-03 LAB — GLUCOSE, CAPILLARY
Glucose-Capillary: 210 mg/dL — ABNORMAL HIGH (ref 70–99)
Glucose-Capillary: 224 mg/dL — ABNORMAL HIGH (ref 70–99)
Glucose-Capillary: 165 mg/dL — ABNORMAL HIGH (ref 70–99)
Glucose-Capillary: 79 mg/dL (ref 70–99)

## 2020-08-03 LAB — MAGNESIUM: Magnesium: 2 mg/dL (ref 1.7–2.4)

## 2020-08-03 MED ORDER — INSULIN DETEMIR 100 UNIT/ML ~~LOC~~ SOLN
22.0000 [IU] | Freq: Every day | SUBCUTANEOUS | Status: DC
  Administered 2020-08-03 – 2020-08-06 (×4): 22 [IU] via SUBCUTANEOUS
  Filled 2020-08-03 (×5): qty 0.22

## 2020-08-03 MED ORDER — SPIRONOLACTONE 12.5 MG HALF TABLET
12.5000 mg | ORAL_TABLET | Freq: Every day | ORAL | Status: DC
  Administered 2020-08-03 – 2020-08-04 (×2): 12.5 mg via ORAL
  Filled 2020-08-03 (×4): qty 1

## 2020-08-03 NOTE — Progress Notes (Signed)
  Speech Language Pathology Treatment: Dysphagia  Patient Details Name: Kristin [name] MRN: [id number] DOB: Sep 02, 1940 Today's Date: 08/03/2020 Time: 7579-7282 SLP Time Calculation (min) (ACUTE ONLY): 14 min  Assessment / Plan / Recommendation Clinical Impression  Pt was pleasantly sitting upright in her chair watching tv and appeared to be in better spirits today. Pt continues to demonstrate intermittent throat clearing but this has occurred across all sessions. No other overt s/s of aspiration were noted with either consecutive sips of water or bites of graham cracker and she remains afebrile with clear and diminished breath sounds. She also reports no subjective difficulty with her recent meals and was appreciative of the diet upgrade. Pt continues to report that she would prefer to remain on her current DYS 3 while at the hospital but her solids can be advanced at her next level of care with use of her dentures. Therefore, continue to recommend her current diet and SLP will sign off at this time.    HPI HPI: Pt is a 80 yo female admitted with NSTEMI, cadiogenic shock s/p emergent CABG x5 on 3/24. She returned to the OR on 3/25 for tamponade requiring evacuation of hematoma. ETT 3/24-3/26. PMH includes DM, obesity, HTN, HL      SLP Plan  All goals met       Recommendations  Diet recommendations: Dysphagia 3 (mechanical soft);Thin liquid Liquids provided via: Cup;Straw Medication Administration: Whole meds with puree (crushed if larger) Supervision: Patient able to self feed;Intermittent supervision to cue for compensatory strategies Compensations: Slow rate;Small sips/bites Postural Changes and/or Swallow Maneuvers: Seated upright 90 degrees                Oral Care Recommendations: Oral care BID Follow up Recommendations: Skilled Nursing facility;24 hour supervision/assistance SLP Visit Diagnosis: Dysphagia, unspecified (R13.10) Plan: All goals met       GO                 Jeanine Luz., SLP Student 08/03/2020, 1:43 PM

## 2020-08-03 NOTE — Progress Notes (Addendum)
Patient ID: Kristin [name], female   DOB: [birth year], 80 y.o.   MRN: [id number]    Cardiologist: Terrence Dupont  Advanced Heart Failure Rounding Note   Subjective:    - 3/24 NSTEMI - 3/24 Emergent CABG x 5 (LIMA to LAD, SEQ SVG to OM and Diagonal, SEQ to PL and PDA) - 3/25 take back to OR for tamponade. EF on TEE 55%, RV mildly down (3/25) -3/26 Extubated -3/28 Milrinone stopped. Midodrine started.  -3/30 S/P CRT-P.  -4/5 left thoracentesis, started on cefepime   1.2L removed with left thoracentesis yesterday.  Patient feels much better today breathing has improved. No longer requiring supplemental O2 at rest. Says she peed a lot yesterday also, not sure if bed weight and I's/O's accurate.  She understands how deconditioned she is and is committed to giving a good effort with PT today.  Not using IS.     Objective:   Weight Range:  Vital Signs:   Temp:  [97.7 F (36.5 C)-98.3 F (36.8 C)] 97.7 F (36.5 C) (04/06 0312) Pulse Rate:  [65-93] 72 (04/06 0312) Resp:  [14-18] 18 (04/06 0312) BP: (94-118)/(51-57) 114/54 (04/06 0312) SpO2:  [90 %-95 %] 95 % (04/06 0312) Weight:  [83.6 kg] 83.6 kg (04/06 0312) Last BM Date: 08/02/20  Weight change: Filed Weights   07/29/20 0400 08/01/20 0425 08/03/20 0312  Weight: 83.2 kg 83.5 kg 83.6 kg    Intake/Output:   Intake/Output Summary (Last 24 hours) at 08/03/2020 0805 Last data filed at 08/02/2020 2316 Gross per 24 hour  Intake 537 ml  Output 600 ml  Net -63 ml      PHYSICAL EXAM: Cardiac: JVD difficult to assess, normal rate and rhythm, clear s1 and s2, no murmurs, rubs or gallops, non pitting LE edema bilaterally Pulmonary: improvement in aeration LLL, rales up to mid lung fields bilaterally, shallow breaths, not in distress Abdominal: non distended abdomen, soft and nontender Psych: Alert, conversant, depressed    Telemetry: BiV pacing 80's-90s   Labs: Basic Metabolic Panel: Recent Labs  Lab 07/30/20 0114 07/31/20 0137  08/01/20 0411 08/02/20 0145 08/03/20 0126  NA 137 134* 139 141 136  K 3.7 3.9 3.9 4.4 4.3  CL 100 99 101 103 101  CO2 '27 28 27 28 27  '$ GLUCOSE 170* 380* 127* 88 265*  BUN 28* 33* 28* 31* 34*  CREATININE 1.67* 1.83* 1.72* 1.71* 1.81*  CALCIUM 7.9* 7.8* 8.1* 8.2* 7.9*  MG 1.9 2.0 2.0 2.0 2.0    Liver Function Tests: No results for input(s): AST, ALT, ALKPHOS, BILITOT, PROT, ALBUMIN in the last 168 hours. No results for input(s): LIPASE, AMYLASE in the last 168 hours. No results for input(s): AMMONIA in the last 168 hours.  CBC: Recent Labs  Lab 07/30/20 0114 07/31/20 0137 08/01/20 0411 08/02/20 1131 08/03/20 0126  WBC 10.0 9.2 11.7* 10.8* 9.0  HGB 9.1* 8.3* 8.5* 8.6* 8.2*  HCT 29.3* 26.3* 27.5* 27.6* 25.9*  MCV 100.7* 99.2 103.0* 102.2* 101.6*  PLT 244 282 346 412* 394    Cardiac Enzymes: No results for input(s): CKTOTAL, CKMB, CKMBINDEX, TROPONINI in the last 168 hours.  BNP: BNP (last 3 results) No results for input(s): BNP in the last 8760 hours.  ProBNP (last 3 results) No results for input(s): PROBNP in the last 8760 hours.    Other results:  Imaging: DG Chest 1 View  Result Date: 08/02/2020 CLINICAL DATA:  Follow-up left thoracentesis. EXAM: CHEST  1 VIEW COMPARISON:  08/01/2020 FINDINGS: Previous median sternotomy.  Pacemaker appears unchanged. There is less pleural fluid on the left. Small amount does persist. No evidence of pneumothorax. Small amount of pleural fluid on the right as seen previously. IMPRESSION: Less pleural fluid on the left. No pneumothorax following thoracentesis. Electronically Signed   By: Nelson Chimes M.D.   On: 08/02/2020 11:45   DG Chest 2 View  Result Date: 08/01/2020 CLINICAL DATA:  Right buttocks pain. EXAM: CHEST - 2 VIEW COMPARISON:  July 28, 2020. FINDINGS: Stable cardiomediastinal silhouette. Left-sided pacemaker is unchanged in position. Sternotomy wires are noted. Mild to moderate left pleural effusion is noted with  associated left basilar atelectasis or infiltrate. Minimal right pleural effusion is noted. Bony thorax is unremarkable. IMPRESSION: Mild to moderate left pleural effusion is noted with associated left basilar atelectasis or infiltrate. Aortic Atherosclerosis (ICD10-I70.0). Electronically Signed   By: Marijo Conception M.D.   On: 08/01/2020 08:10   ECHOCARDIOGRAM LIMITED  Result Date: 08/01/2020    ECHOCARDIOGRAM LIMITED REPORT   Patient Name:   Kristin [name] Date of Exam: 08/01/2020 Medical Rec #:  [id number]       Height:       66.0 in Accession #:    [id number]      Weight:       184.1 lb Date of Birth:  [birth year]       BSA:          1.931 m Patient Age:    64 years        BP:           112/56 mmHg Patient Gender: F               HR:           94 bpm. Exam Location:  Inpatient Procedure: Limited Echo, Color Doppler and Cardiac Doppler Indications:    Congestive Heart Failure I50.9  History:        Patient has prior history of Echocardiogram examinations, most                 recent 07/25/2020. Prior CABG; Risk Factors:Hypertension and                 Diabetes.  Sonographer:    Mikki Santee RDCS (AE) Referring Phys: [place]  1. Inferior basal septal distal anterior wall hypokinesis consistent with multi vessel CAD. Left ventricular ejection fraction, by estimation, is 40 to 45%. The left ventricle has mildly decreased function. The left ventricle demonstrates regional wall motion abnormalities (see scoring diagram/findings for description). The left ventricular internal cavity size was moderately dilated. Left ventricular diastolic parameters are consistent with Grade I diastolic dysfunction (impaired relaxation).  2. ? catheters in RA/RV . Right ventricular systolic function is normal. The right ventricular size is normal. There is normal pulmonary artery systolic pressure.  3. Left atrial size was mildly dilated.  4. Right atrial size was mildly dilated.  5. A small pericardial  effusion is present. The pericardial effusion is lateral to the left ventricle and posterior to the left ventricle. Moderate pleural effusion in the left lateral region.  6. The mitral valve is abnormal. Mild mitral valve regurgitation. No evidence of mitral stenosis.  7. Tricuspid valve regurgitation is moderate.  8. The aortic valve is normal in structure. Aortic valve regurgitation is trivial. No aortic stenosis is present.  9. The inferior vena cava is dilated in size with >50% respiratory variability, suggesting right atrial pressure of 8 mmHg. FINDINGS  Left Ventricle: Inferior basal septal distal anterior wall hypokinesis consistent with multi vessel CAD. Left ventricular ejection fraction, by estimation, is 40 to 45%. The left ventricle has mildly decreased function. The left ventricle demonstrates regional wall motion abnormalities. The left ventricular internal cavity size was moderately dilated. There is no left ventricular hypertrophy. Left ventricular diastolic parameters are consistent with Grade I diastolic dysfunction (impaired relaxation). Right Ventricle: ? catheters in RA/RV. The right ventricular size is normal. No increase in right ventricular wall thickness. Right ventricular systolic function is normal. There is normal pulmonary artery systolic pressure. The tricuspid regurgitant velocity is 2.29 m/s, and with an assumed right atrial pressure of 8 mmHg, the estimated right ventricular systolic pressure is 0000000 mmHg. Left Atrium: Left atrial size was mildly dilated. Right Atrium: Right atrial size was mildly dilated. Pericardium: A small pericardial effusion is present. The pericardial effusion is lateral to the left ventricle and posterior to the left ventricle. Mitral Valve: The mitral valve is abnormal. There is mild thickening of the mitral valve leaflet(s). There is mild calcification of the mitral valve leaflet(s). Mild mitral valve regurgitation. No evidence of mitral valve stenosis.  Tricuspid Valve: The tricuspid valve is normal in structure. Tricuspid valve regurgitation is moderate . No evidence of tricuspid stenosis. Aortic Valve: The aortic valve is normal in structure. Aortic valve regurgitation is trivial. No aortic stenosis is present. Pulmonic Valve: The pulmonic valve was normal in structure. Pulmonic valve regurgitation is not visualized. No evidence of pulmonic stenosis. Aorta: The aortic root is normal in size and structure. Venous: The inferior vena cava is dilated in size with greater than 50% respiratory variability, suggesting right atrial pressure of 8 mmHg. IAS/Shunts: No atrial level shunt detected by color flow Doppler. Additional Comments: There is a moderate pleural effusion in the left lateral region.  Diastology LV e' medial:    6.09 cm/s LV E/e' medial:  12.4 LV e' lateral:   6.53 cm/s LV E/e' lateral: 11.5  LEFT ATRIUM             Index LA Vol (A2C):   80.7 ml 41.80 ml/m LA Vol (A4C):   19.8 ml 10.26 ml/m LA Biplane Vol: 41.1 ml 21.29 ml/m  MITRAL VALVE                TRICUSPID VALVE MV Area (PHT): 4.10 cm     TR Peak grad:   21.0 mmHg MV Decel Time: 185 msec     TR Vmax:        229.00 cm/s MV E velocity: 75.30 cm/s MV A velocity: 105.00 cm/s MV E/A ratio:  0.72 Jenkins Rouge MD Electronically signed by Jenkins Rouge MD Signature Date/Time: 08/01/2020/3:02:23 PM    Final    IR THORACENTESIS ASP PLEURAL SPACE W/IMG GUIDE  Result Date: 08/02/2020 INDICATION: Recent CABG. Left-sided pleural effusion. Request therapeutic thoracentesis. EXAM: ULTRASOUND GUIDED LEFT THORACENTESIS MEDICATIONS: None. COMPLICATIONS: None immediate. PROCEDURE: An ultrasound guided thoracentesis was thoroughly discussed with the patient and questions answered. The benefits, risks, alternatives and complications were also discussed. The patient understands and wishes to proceed with the procedure. Written consent was obtained. Ultrasound was performed to localize and mark an adequate pocket of  fluid in the left chest. The area was then prepped and draped in the normal sterile fashion. 1% Lidocaine was used for local anesthesia. Under ultrasound guidance a 6 Fr Safe-T-Centesis catheter was introduced. Thoracentesis was performed. The catheter was removed and a dressing applied. FINDINGS: A total of approximately  1.2 L of serosanguineous fluid was removed. IMPRESSION: Successful ultrasound guided left thoracentesis yielding 1.2 L of pleural fluid. Read by: Ascencion Dike PA-C Electronically Signed   By: Corrie Mckusick D.O.   On: 08/02/2020 11:53     Medications:     Scheduled Medications: . acetaminophen (TYLENOL) oral liquid 160 mg/5 mL  1,000 mg Oral Q6H   Or  . acetaminophen  1,000 mg Oral Q6H  . aspirin  81 mg Oral Daily  . atorvastatin  80 mg Oral q1800  . bisacodyl  10 mg Oral Daily   Or  . bisacodyl  10 mg Rectal Daily  . Chlorhexidine Gluconate Cloth  6 each Topical Daily  . clopidogrel  75 mg Oral Daily  . docusate sodium  200 mg Oral Daily  . enoxaparin (LOVENOX) injection  30 mg Subcutaneous QHS  . feeding supplement  237 mL Oral TID BM  . furosemide  80 mg Intravenous BID  . insulin aspart  0-24 Units Subcutaneous TID WC  . insulin aspart  5 Units Subcutaneous TID WC  . insulin detemir  22 Units Subcutaneous Daily  . mouth rinse  15 mL Mouth Rinse BID  . megestrol  40 mg Oral Daily  . melatonin  3 mg Oral QHS  . midodrine  5 mg Oral TID WC  . multivitamin with minerals  1 tablet Oral Daily  . pantoprazole sodium  40 mg Oral Daily   Or  . pantoprazole  40 mg Oral Daily  . polyethylene glycol  17 g Oral Daily  . potassium chloride  20 mEq Oral BID  . QUEtiapine  50 mg Oral QHS  . sodium chloride flush  3 mL Intravenous Q12H    Infusions: . ceFEPime (MAXIPIME) IV Stopped (08/02/20 2250)    PRN Medications: acetaminophen, dextrose, guaiFENesin-dextromethorphan, lidocaine, magic mouthwash, metoprolol tartrate, ondansetron (ZOFRAN) IV, oxyCODONE,  traMADol   Assessment/Plan:   1. Ischemic Cardiomyopathy, Cardiogenic shock - s/p CABG 3/24 with take back for tamponade on 3/25 - LVEF 55% on post-op TEE 3/25 - Off pressors. Off milrinone - continue midodrine 5 mg three times a day - started back on IV lasix 08/01/2020 now on 80 BID lungs are still wet, hopefully bp will improve with abx and support diuresis, watch renal fx closely - Repeat limited echo on 08/01/2020 with EF 40-45%  2. CAD s/p NSTEMI  - CABG x 5 3/24 - no evidence of ongoing ischemia - continue DAPT, statin. No bb with shock.   3. Acute hypoxic respiratory failure post-op/ left pleural effusion  - Stable on room air but endorses mild dyspnea - CXR w/ moderate left pleural effusion had left thoracentesis 4/5 no fluid analysis performed - Continue IV lasix - treating for aspiration pna with cefepime - needs to use IS today discussed with patient, she has poor inspiratory effort   4. AKI - baseline creatinine 1.1 -> peaked at 2>1.4-> 1.7->1.7>1.63>1.8>1.7>1.8 - due to shock/ATN - continue to monitor with diuresis  5. Acute blood loss anemia, post -op - stable at 8.2  6. Hypernatremia -resolved  7. Thrombocytopenia - resolved  8. Dysphagia - Speech following now on dysphagia 3 diet   9. Deconditioning - c/w PT/OT - insurance denied CIR  - Plan d/c to SNF when medically ready   10 CHB - EP consulted.  - S/P CRT-P-- 07/27/20   Length of Stay: [address]  PA-C 08/03/2020, 8:05 AM  Advanced Heart Failure Team Pager [phone] (M-F; 7a -  4p)  Please contact [facility] for night-coverage after hours (4p -7a ) and weekends on [email or web address]  Patient seen and examined with the above-signed Advanced Practice Provider and/or Housestaff. I personally reviewed laboratory data, imaging studies and relevant notes. I independently examined the patient and formulated the important aspects of the plan. I have edited the note to reflect any of my changes  or salient points. I have personally discussed the plan with the patient and/or family.  Feeling better today after thoracentesis. Less SOB. Remains very anxious. Now on cefepime. Limited echo EF 40-45%  General:  Sitting in chair No resp difficulty HEENT: normal Neck: supple.JVP 7 Carotids 2+ bilat; no bruits. No lymphadenopathy or thryomegaly appreciated. Cor: PMI nondisplaced. Regular rate & rhythm. No rubs, gallops or murmurs. Mild swelling at top of sternotomy scar Lungs: dull on left. Crackles R base  Abdomen: soft, nontender, nondistended. No hepatosplenomegaly. No bruits or masses. Good bowel sounds. Extremities: no cyanosis, clubbing, rash, edema Neuro: alert & orientedx3, cranial nerves grossly intact. moves all 4 extremities w/o difficulty. Affect pleasant  Improving slowly but still a bit to go. Weight still up. Would continue IV lasix. Continue cefepime for possible PNA. I personally instructed on use of IS. Continue to mobilize. Remains on midodrine so hard to titrate GDMT. I will add spiro 12.5.  [name], MD  3:33 PM

## 2020-08-03 NOTE — TOC Progression Note (Addendum)
Transition of Care (TOC) - Progression Note  Heart Failure   Patient Details  Name: Kristin [name] MRN: [id number] Date of Birth: [birth year]  Transition of Care [facility]) CM/SW [place], [place] Phone Number: 08/03/2020, 3:14 PM  Clinical Narrative:    CSW received a call from Arbie Cookey at [facility] this morning at 10:54am and she reported that the patients health insurance isn't in network with the SNF unfortunately. CSW informed the patient of this and although the patient was disappointed she understood and said to try to reach out to [facility] and [facility] for availability. CSW will continue to seek bed offers for Kristin [name] and inform the patients daughter of this information as well.  CSW spoke with Olivia Mackie [phone] at 3:50pm [facility] [place] to ask about bed availability and she reported maybe have a bed available this coming Friday and she will look in the hub and get back to CSW.  TOC will continue to follow for d/c needs.    Expected Discharge Plan: [place] Barriers to Discharge: Continued Medical Work up  Expected Discharge Plan and Services Expected Discharge Plan: [place] In-house Referral: Clinical Social Work Discharge Planning Services: CM Consult Post Acute Care Choice: [place] arrangements for the past 2 months: Single Family Home                                       Social Determinants of Health (SDOH) Interventions    Readmission Risk Interventions No flowsheet data found.   Yue Flanigan, MSW, [place] Heart Failure Social Worker

## 2020-08-03 NOTE — Progress Notes (Signed)
[address]       [place] [address]             [phone]      7 Days Post-Op Procedure(s) (LRB): BIV PACEMAKER INSERTION CRT-P (N/A) Subjective: Feels much better, breathing more comfortably after thoracentesis  Objective: Vital signs in last 24 hours: Temp:  [97.7 F (36.5 C)-98.3 F (36.8 C)] 97.7 F (36.5 C) (04/06 0312) Pulse Rate:  [65-93] 72 (04/06 0312) Cardiac Rhythm: A-V Sequential paced;Heart block (04/05 1925) Resp:  [14-18] 18 (04/06 0312) BP: (94-118)/(51-57) 114/54 (04/06 0312) SpO2:  [90 %-95 %] 95 % (04/06 0312) Weight:  [83.6 kg] 83.6 kg (04/06 0312)  Hemodynamic parameters for last 24 hours:    Intake/Output from previous day: 04/05 0701 - 04/06 0700 In: 537 [P.O.:337; IV Piggyback:200] Out: 600 [Urine:600] Intake/Output this shift: No intake/output data recorded.  General appearance: alert, cooperative and no distress Heart: regular rate and rhythm Lungs: mildly dim in bases Abdomen: benign Extremities: no edema Wound: incis healing well  Lab Results: Recent Labs    08/02/20 1131 08/03/20 0126  WBC 10.8* 9.0  HGB 8.6* 8.2*  HCT 27.6* 25.9*  PLT 412* 394   BMET:  Recent Labs    08/02/20 0145 08/03/20 0126  NA 141 136  K 4.4 4.3  CL 103 101  CO2 28 27  GLUCOSE 88 265*  BUN 31* 34*  CREATININE 1.71* 1.81*  CALCIUM 8.2* 7.9*    PT/INR: No results for input(s): LABPROT, INR in the last 72 hours. ABG    Component Value Date/Time   PHART 7.442 07/23/2020 0434   HCO3 29.2 (H) 07/23/2020 0434   TCO2 31 07/23/2020 0434   ACIDBASEDEF 1.0 07/22/2020 0814   O2SAT 59.9 07/26/2020 0357   CBG (last 3)  Recent Labs    08/02/20 1604 08/02/20 2015 08/03/20 0621  GLUCAP 197* 206* 210*    Meds Scheduled Meds: . acetaminophen (TYLENOL) oral liquid 160 mg/5 mL  1,000 mg Oral Q6H   Or  . acetaminophen  1,000 mg Oral Q6H  . aspirin  81 mg Oral Daily  . atorvastatin  80 mg Oral q1800  . bisacodyl  10 mg  Oral Daily   Or  . bisacodyl  10 mg Rectal Daily  . Chlorhexidine Gluconate Cloth  6 each Topical Daily  . clopidogrel  75 mg Oral Daily  . docusate sodium  200 mg Oral Daily  . enoxaparin (LOVENOX) injection  30 mg Subcutaneous QHS  . feeding supplement  237 mL Oral TID BM  . furosemide  80 mg Intravenous BID  . insulin aspart  0-24 Units Subcutaneous TID WC  . insulin aspart  5 Units Subcutaneous TID WC  . insulin detemir  18 Units Subcutaneous Daily  . mouth rinse  15 mL Mouth Rinse BID  . megestrol  40 mg Oral Daily  . melatonin  3 mg Oral QHS  . midodrine  5 mg Oral TID WC  . multivitamin with minerals  1 tablet Oral Daily  . pantoprazole sodium  40 mg Oral Daily   Or  . pantoprazole  40 mg Oral Daily  . polyethylene glycol  17 g Oral Daily  . potassium chloride  20 mEq Oral BID  . QUEtiapine  50 mg Oral QHS  . sodium chloride flush  3 mL Intravenous Q12H   Continuous Infusions: . ceFEPime (MAXIPIME) IV Stopped (08/02/20 2250)   PRN Meds:.acetaminophen, dextrose, guaiFENesin-dextromethorphan, lidocaine, magic mouthwash,  metoprolol tartrate, ondansetron (ZOFRAN) IV, oxyCODONE, traMADol  Xrays DG Chest 1 View  Result Date: 08/02/2020 CLINICAL DATA:  Follow-up left thoracentesis. EXAM: CHEST  1 VIEW COMPARISON:  08/01/2020 FINDINGS: Previous median sternotomy. Pacemaker appears unchanged. There is less pleural fluid on the left. Small amount does persist. No evidence of pneumothorax. Small amount of pleural fluid on the right as seen previously. IMPRESSION: Less pleural fluid on the left. No pneumothorax following thoracentesis. Electronically Signed   By: Nelson Chimes M.D.   On: 08/02/2020 11:45   DG Chest 2 View  Result Date: 08/01/2020 CLINICAL DATA:  Right buttocks pain. EXAM: CHEST - 2 VIEW COMPARISON:  July 28, 2020. FINDINGS: Stable cardiomediastinal silhouette. Left-sided pacemaker is unchanged in position. Sternotomy wires are noted. Mild to moderate left pleural  effusion is noted with associated left basilar atelectasis or infiltrate. Minimal right pleural effusion is noted. Bony thorax is unremarkable. IMPRESSION: Mild to moderate left pleural effusion is noted with associated left basilar atelectasis or infiltrate. Aortic Atherosclerosis (ICD10-I70.0). Electronically Signed   By: Marijo Conception M.D.   On: 08/01/2020 08:10   ECHOCARDIOGRAM LIMITED  Result Date: 08/01/2020    ECHOCARDIOGRAM LIMITED REPORT   Patient Name:   Kristin [name] Date of Exam: 08/01/2020 Medical Rec #:  [id number]       Height:       66.0 in Accession #:    [id number]      Weight:       184.1 lb Date of Birth:  [birth year]       BSA:          1.931 m Patient Age:    80 years        BP:           112/56 mmHg Patient Gender: F               HR:           94 bpm. Exam Location:  Inpatient Procedure: Limited Echo, Color Doppler and Cardiac Doppler Indications:    Congestive Heart Failure I50.9  History:        Patient has prior history of Echocardiogram examinations, most                 recent 07/25/2020. Prior CABG; Risk Factors:Hypertension and                 Diabetes.  Sonographer:    Mikki Santee RDCS (AE) Referring Phys: [place]  1. Inferior basal septal distal anterior wall hypokinesis consistent with multi vessel CAD. Left ventricular ejection fraction, by estimation, is 40 to 45%. The left ventricle has mildly decreased function. The left ventricle demonstrates regional wall motion abnormalities (see scoring diagram/findings for description). The left ventricular internal cavity size was moderately dilated. Left ventricular diastolic parameters are consistent with Grade I diastolic dysfunction (impaired relaxation).  2. ? catheters in RA/RV . Right ventricular systolic function is normal. The right ventricular size is normal. There is normal pulmonary artery systolic pressure.  3. Left atrial size was mildly dilated.  4. Right atrial size was mildly dilated.   5. A small pericardial effusion is present. The pericardial effusion is lateral to the left ventricle and posterior to the left ventricle. Moderate pleural effusion in the left lateral region.  6. The mitral valve is abnormal. Mild mitral valve regurgitation. No evidence of mitral stenosis.  7. Tricuspid valve regurgitation is moderate.  8. The aortic valve is normal  in structure. Aortic valve regurgitation is trivial. No aortic stenosis is present.  9. The inferior vena cava is dilated in size with >50% respiratory variability, suggesting right atrial pressure of 8 mmHg. FINDINGS  Left Ventricle: Inferior basal septal distal anterior wall hypokinesis consistent with multi vessel CAD. Left ventricular ejection fraction, by estimation, is 40 to 45%. The left ventricle has mildly decreased function. The left ventricle demonstrates regional wall motion abnormalities. The left ventricular internal cavity size was moderately dilated. There is no left ventricular hypertrophy. Left ventricular diastolic parameters are consistent with Grade I diastolic dysfunction (impaired relaxation). Right Ventricle: ? catheters in RA/RV. The right ventricular size is normal. No increase in right ventricular wall thickness. Right ventricular systolic function is normal. There is normal pulmonary artery systolic pressure. The tricuspid regurgitant velocity is 2.29 m/s, and with an assumed right atrial pressure of 8 mmHg, the estimated right ventricular systolic pressure is 0000000 mmHg. Left Atrium: Left atrial size was mildly dilated. Right Atrium: Right atrial size was mildly dilated. Pericardium: A small pericardial effusion is present. The pericardial effusion is lateral to the left ventricle and posterior to the left ventricle. Mitral Valve: The mitral valve is abnormal. There is mild thickening of the mitral valve leaflet(s). There is mild calcification of the mitral valve leaflet(s). Mild mitral valve regurgitation. No evidence of  mitral valve stenosis. Tricuspid Valve: The tricuspid valve is normal in structure. Tricuspid valve regurgitation is moderate . No evidence of tricuspid stenosis. Aortic Valve: The aortic valve is normal in structure. Aortic valve regurgitation is trivial. No aortic stenosis is present. Pulmonic Valve: The pulmonic valve was normal in structure. Pulmonic valve regurgitation is not visualized. No evidence of pulmonic stenosis. Aorta: The aortic root is normal in size and structure. Venous: The inferior vena cava is dilated in size with greater than 50% respiratory variability, suggesting right atrial pressure of 8 mmHg. IAS/Shunts: No atrial level shunt detected by color flow Doppler. Additional Comments: There is a moderate pleural effusion in the left lateral region.  Diastology LV e' medial:    6.09 cm/s LV E/e' medial:  12.4 LV e' lateral:   6.53 cm/s LV E/e' lateral: 11.5  LEFT ATRIUM             Index LA Vol (A2C):   80.7 ml 41.80 ml/m LA Vol (A4C):   19.8 ml 10.26 ml/m LA Biplane Vol: 41.1 ml 21.29 ml/m  MITRAL VALVE                TRICUSPID VALVE MV Area (PHT): 4.10 cm     TR Peak grad:   21.0 mmHg MV Decel Time: 185 msec     TR Vmax:        229.00 cm/s MV E velocity: 75.30 cm/s MV A velocity: 105.00 cm/s MV E/A ratio:  0.72 Jenkins Rouge MD Electronically signed by Jenkins Rouge MD Signature Date/Time: 08/01/2020/3:02:23 PM    Final    IR THORACENTESIS ASP PLEURAL SPACE W/IMG GUIDE  Result Date: 08/02/2020 INDICATION: Recent CABG. Left-sided pleural effusion. Request therapeutic thoracentesis. EXAM: ULTRASOUND GUIDED LEFT THORACENTESIS MEDICATIONS: None. COMPLICATIONS: None immediate. PROCEDURE: An ultrasound guided thoracentesis was thoroughly discussed with the patient and questions answered. The benefits, risks, alternatives and complications were also discussed. The patient understands and wishes to proceed with the procedure. Written consent was obtained. Ultrasound was performed to localize and mark  an adequate pocket of fluid in the left chest. The area was then prepped and draped in the normal  sterile fashion. 1% Lidocaine was used for local anesthesia. Under ultrasound guidance a 6 Fr Safe-T-Centesis catheter was introduced. Thoracentesis was performed. The catheter was removed and a dressing applied. FINDINGS: A total of approximately 1.2 L of serosanguineous fluid was removed. IMPRESSION: Successful ultrasound guided left thoracentesis yielding 1.2 L of pleural fluid. Read by: Ascencion Dike PA-C Electronically Signed   By: Corrie Mckusick D.O.   On: 08/02/2020 11:53    Assessment/Plan: S/P Procedure(s) (LRB): BIV PACEMAKER INSERTION CRT-P (N/A)  1 afeb, VSS, paced rhythm 2 sats good on 2 liters 3 BS running a little higher, will adjust insulin 4 BUN/creat 34/1.81 today, up a little from previous 5 no leukocytosis 6 H/H a bit lower today 7 1.2 liters from thoracentesis 8 ID now on cefepime per AHF team 9 AHF managing diuretics/CHF management 10 SNF when mediacally stable  11 push rehab as able   LOS: 14 days    Kristin Giovanni PA-C Pager [phone] 08/03/2020

## 2020-08-03 NOTE — Progress Notes (Addendum)
Inpatient Diabetes Program Recommendations  AACE/ADA: New Consensus Statement on Inpatient Glycemic Control (2015)  Target Ranges:  Prepandial:   less than 140 mg/dL      Peak postprandial:   less than 180 mg/dL (1-2 hours)      Critically ill patients:  140 - 180 mg/dL   Results for Kristin [name], Kristin [name] (MRN [id number]) as of 08/03/2020 07:50  Ref. Range 08/02/2020 06:20 08/02/2020 11:55 08/02/2020 16:04 08/02/2020 20:15  Glucose-Capillary Latest Ref Range: 70 - 99 mg/dL 112 (H) 239 (H)  13 units NOVOLOG  18 units LEVEMIR  197 (H)  9  units NOVOLOG  206 (H)   Results for Kristin [name], Kristin [name] (MRN [id number]) as of 08/03/2020 07:50  Ref. Range 08/03/2020 06:21  Glucose-Capillary Latest Ref Range: 70 - 99 mg/dL 210 (H)   Home DM Meds: Amaryl 1 mg Daily                             Levemir 42 units Daily                             Actos 30 mg Daily   Current Orders: Levemir 22 units Daily                            Novolog 5 units TID with meals                            Novolog 0-24 units TID     MD- Note Levemir increased this AM.   Noted post-meal CBGs remain elevated yesterday.  Please consider increasing the Novolog Meal Coverage to 8 units TID with meals    --Will follow patient during hospitalization--  Wyn Quaker RN, MSN, CDE Diabetes Coordinator Inpatient Glycemic Control Team Team Pager: [phone] (8a-5p)

## 2020-08-03 NOTE — Progress Notes (Signed)
OT Cancellation Note  Patient Details Name: Kristin [name] MRN: [id number] DOB: 1941-02-03   Cancelled Treatment:    Reason Eval/Treat Not Completed: Patient declined, just worked with mobility specialist, and lied down.  OT to continue efforts as appropriate.    Jearldine Cassady D Kayal Mula 08/03/2020, 4:09 PM

## 2020-08-03 NOTE — Progress Notes (Signed)
Mobility Specialist: Progress Note   08/03/20 1503  Mobility  Activity Ambulated in hall  Level of Assistance Minimal assist, patient does 75% or more  Assistive Device Four wheel walker  Distance Ambulated (ft) 200 ft  Mobility Response Tolerated fair  Mobility performed by Mobility specialist  $Mobility charge 1 Mobility   Pre-Mobility: 95 HR During Mobility: 110 HR, 98% SpO2 Post-Mobility: 95 HR, 135/52 BP, 96% SpO2  Pt was minA to stand from chair and contact guard during ambulation. Pt SOB during ambulation and stopped for a short standing break. Pt otherwise asx. Pt to bed after walk per request. Will f/u tomorrow.   [facility] Maritsa Hunsucker Mobility Specialist Mobility Specialist Phone: [phone]

## 2020-08-04 DIAGNOSIS — I5021 Acute systolic (congestive) heart failure: Secondary | ICD-10-CM | POA: Diagnosis not present

## 2020-08-04 DIAGNOSIS — Z951 Presence of aortocoronary bypass graft: Secondary | ICD-10-CM | POA: Diagnosis not present

## 2020-08-04 DIAGNOSIS — I214 Non-ST elevation (NSTEMI) myocardial infarction: Secondary | ICD-10-CM | POA: Diagnosis not present

## 2020-08-04 LAB — CBC
HCT: 27.5 % — ABNORMAL LOW (ref 36.0–46.0)
Hemoglobin: 8.7 g/dL — ABNORMAL LOW (ref 12.0–15.0)
MCH: 31.9 pg (ref 26.0–34.0)
MCHC: 31.6 g/dL (ref 30.0–36.0)
MCV: 100.7 fL — ABNORMAL HIGH (ref 80.0–100.0)
Platelets: 404 10*3/uL — ABNORMAL HIGH (ref 150–400)
RBC: 2.73 MIL/uL — ABNORMAL LOW (ref 3.87–5.11)
RDW: 18.5 % — ABNORMAL HIGH (ref 11.5–15.5)
WBC: 8.5 10*3/uL (ref 4.0–10.5)
nRBC: 0.2 % (ref 0.0–0.2)

## 2020-08-04 LAB — BASIC METABOLIC PANEL
Anion gap: 10 (ref 5–15)
BUN: 34 mg/dL — ABNORMAL HIGH (ref 8–23)
CO2: 27 mmol/L (ref 22–32)
Calcium: 8.1 mg/dL — ABNORMAL LOW (ref 8.9–10.3)
Chloride: 103 mmol/L (ref 98–111)
Creatinine, Ser: 1.75 mg/dL — ABNORMAL HIGH (ref 0.44–1.00)
GFR, Estimated: 29 mL/min — ABNORMAL LOW (ref >60)
Glucose, Bld: 96 mg/dL (ref 70–99)
Potassium: 4 mmol/L (ref 3.5–5.1)
Sodium: 140 mmol/L (ref 135–145)

## 2020-08-04 LAB — GLUCOSE, CAPILLARY
Glucose-Capillary: 115 mg/dL — ABNORMAL HIGH (ref 70–99)
Glucose-Capillary: 246 mg/dL — ABNORMAL HIGH (ref 70–99)
Glucose-Capillary: 164 mg/dL — ABNORMAL HIGH (ref 70–99)
Glucose-Capillary: 221 mg/dL — ABNORMAL HIGH (ref 70–99)

## 2020-08-04 LAB — MAGNESIUM: Magnesium: 1.9 mg/dL (ref 1.7–2.4)

## 2020-08-04 LAB — PROCALCITONIN: Procalcitonin: 1.58 ng/mL

## 2020-08-04 NOTE — TOC Progression Note (Addendum)
Transition of Care (TOC) - Progression Note  Heart Failure   Patient Details  Name: JAELIN ZAMOR MRN: [id number] Date of Birth: 1940-07-13  Transition of Care [facility]) CM/SW Startup, [place] Phone Number: 08/04/2020, 1:21 PM  Clinical Narrative:    CSW attempted to outreach Olivia Mackie [phone] with [facility] at [facility] at 11:53am and was unable to speak with Olivia Mackie who sent a secure text message that she will call the CSW back. CSW is attempting to follow up with Olivia Mackie at [facility] regarding bed availability and insurance authorization for Ms. Longhi. CSW will continue to outreach [facility] in [place] for SNF bed availability.  3:53pm: CSW received a call back from Olivia Mackie [phone]) at [facility] who reported having no bed availability until next week maybe Tuesday. Olivia Mackie reported having a water leak at [facility] that was unexpected and to call her next week.  The only other option for SNF is [facility] and patient refuses this bed offer. No other bed offers in [place] at this time.  TOC will continue to follow for d/c needs.    Expected Discharge Plan: [place] Barriers to Discharge: Continued Medical Work up  Expected Discharge Plan and Services Expected Discharge Plan: [place] In-house Referral: Clinical Social Work Discharge Planning Services: CM Consult Post Acute Care Choice: [place] arrangements for the past 2 months: Single Family Home                                       Social Determinants of Health (SDOH) Interventions    Readmission Risk Interventions No flowsheet data found.  Clora Ohmer, MSW, [place] Heart Failure Social Worker

## 2020-08-04 NOTE — Progress Notes (Signed)
  Called to reviewed tele with HR variability, 60-110s at most.   Noted periods of paroxysmal atrial fibrillation on tele just prior to 0900.  Device interrogation performed with 1.1% mode switch since implant,  35 episodes total. Longest 38 minutes.   Outside of window of "post op" AF. She is still volume overloaded and diuresing.   Discussed personally with Dr. [name]. Likely reactive and subclinical AF not warranting OAC at this time.   Will continue to follow burden on device. Suspect she will have less as she gets further out from her acute illness.   CHA2DS2VASC of at least 5.   Prolonged episodes will certainly warrant [place].   Legrand Como [id number] [place], PA-C  08/04/2020 2:28 PM

## 2020-08-04 NOTE — Progress Notes (Signed)
[address]       [place] [address]             [phone]      8 Days Post-Op Procedure(s) (LRB): BIV PACEMAKER INSERTION CRT-P (N/A) Subjective: Feels better/SOB improving daily, strength improving  Objective: Vital signs in last 24 hours: Temp:  [98.2 F (36.8 C)-98.5 F (36.9 C)] 98.5 F (36.9 C) (04/07 0334) Pulse Rate:  [88-96] 94 (04/07 0334) Cardiac Rhythm: Ventricular paced (04/07 0722) Resp:  [12-18] 16 (04/07 0334) BP: (112-126)/(49-61) 120/59 (04/07 0334) SpO2:  [94 %-99 %] 95 % (04/07 0334) Weight:  [80.4 kg] 80.4 kg (04/07 0334)  Hemodynamic parameters for last 24 hours:    Intake/Output from previous day: 04/06 0701 - 04/07 0700 In: 650 [P.O.:450; IV Piggyback:200] Out: -  Intake/Output this shift: No intake/output data recorded.  General appearance: alert, cooperative and no distress Heart: regular rate and rhythm Lungs: min dim in bases Abdomen: benign Extremities: no edema Wound: incis healing well  Lab Results: Recent Labs    08/03/20 0126 08/04/20 0213  WBC 9.0 8.5  HGB 8.2* 8.7*  HCT 25.9* 27.5*  PLT 394 404*   BMET:  Recent Labs    08/03/20 0126 08/04/20 0213  NA 136 140  K 4.3 4.0  CL 101 103  CO2 27 27  GLUCOSE 265* 96  BUN 34* 34*  CREATININE 1.81* 1.75*  CALCIUM 7.9* 8.1*    PT/INR: No results for input(s): LABPROT, INR in the last 72 hours. ABG    Component Value Date/Time   PHART 7.442 07/23/2020 0434   HCO3 29.2 (H) 07/23/2020 0434   TCO2 31 07/23/2020 0434   ACIDBASEDEF 1.0 07/22/2020 0814   O2SAT 59.9 07/26/2020 0357   CBG (last 3)  Recent Labs    08/03/20 1610 08/03/20 2143 08/04/20 0614  GLUCAP 165* 79 115*    Meds Scheduled Meds: . acetaminophen (TYLENOL) oral liquid 160 mg/5 mL  1,000 mg Oral Q6H   Or  . acetaminophen  1,000 mg Oral Q6H  . aspirin  81 mg Oral Daily  . atorvastatin  80 mg Oral q1800  . bisacodyl  10 mg Oral Daily   Or  . bisacodyl  10 mg Rectal  Daily  . Chlorhexidine Gluconate Cloth  6 each Topical Daily  . clopidogrel  75 mg Oral Daily  . docusate sodium  200 mg Oral Daily  . enoxaparin (LOVENOX) injection  30 mg Subcutaneous QHS  . feeding supplement  237 mL Oral TID BM  . furosemide  80 mg Intravenous BID  . insulin aspart  0-24 Units Subcutaneous TID WC  . insulin aspart  5 Units Subcutaneous TID WC  . insulin detemir  22 Units Subcutaneous Daily  . mouth rinse  15 mL Mouth Rinse BID  . megestrol  40 mg Oral Daily  . melatonin  3 mg Oral QHS  . midodrine  5 mg Oral TID WC  . multivitamin with minerals  1 tablet Oral Daily  . pantoprazole sodium  40 mg Oral Daily   Or  . pantoprazole  40 mg Oral Daily  . polyethylene glycol  17 g Oral Daily  . potassium chloride  20 mEq Oral BID  . QUEtiapine  50 mg Oral QHS  . sodium chloride flush  3 mL Intravenous Q12H  . spironolactone  12.5 mg Oral Daily   Continuous Infusions: . ceFEPime (MAXIPIME) IV Stopped (08/03/20 2210)   PRN Meds:.acetaminophen, dextrose,  guaiFENesin-dextromethorphan, lidocaine, magic mouthwash, metoprolol tartrate, ondansetron (ZOFRAN) IV, oxyCODONE, traMADol  Xrays DG Chest 1 View  Result Date: 08/02/2020 CLINICAL DATA:  Follow-up left thoracentesis. EXAM: CHEST  1 VIEW COMPARISON:  08/01/2020 FINDINGS: Previous median sternotomy. Pacemaker appears unchanged. There is less pleural fluid on the left. Small amount does persist. No evidence of pneumothorax. Small amount of pleural fluid on the right as seen previously. IMPRESSION: Less pleural fluid on the left. No pneumothorax following thoracentesis. Electronically Signed   By: Nelson Chimes M.D.   On: 08/02/2020 11:45   IR THORACENTESIS ASP PLEURAL SPACE W/IMG GUIDE  Result Date: 08/02/2020 INDICATION: Recent CABG. Left-sided pleural effusion. Request therapeutic thoracentesis. EXAM: ULTRASOUND GUIDED LEFT THORACENTESIS MEDICATIONS: None. COMPLICATIONS: None immediate. PROCEDURE: An ultrasound guided  thoracentesis was thoroughly discussed with the patient and questions answered. The benefits, risks, alternatives and complications were also discussed. The patient understands and wishes to proceed with the procedure. Written consent was obtained. Ultrasound was performed to localize and mark an adequate pocket of fluid in the left chest. The area was then prepped and draped in the normal sterile fashion. 1% Lidocaine was used for local anesthesia. Under ultrasound guidance a 6 Fr Safe-T-Centesis catheter was introduced. Thoracentesis was performed. The catheter was removed and a dressing applied. FINDINGS: A total of approximately 1.2 L of serosanguineous fluid was removed. IMPRESSION: Successful ultrasound guided left thoracentesis yielding 1.2 L of pleural fluid. Read by: Ascencion Dike PA-C Electronically Signed   By: Corrie Mckusick D.O.   On: 08/02/2020 11:53    Assessment/Plan: S/P Procedure(s) (LRB): BIV PACEMAKER INSERTION CRT-P (N/A)  1 afeb, VSS, PPM 2 sats good on 2 liters [place] 3 creat down a little to 1.75 4 no leukocytosis, on cefepime 5 H/H improved trend 6 PCT trend improving 7 BS a bit better control with insulin increase yesterday 8 AHF managing meds/lasix for CHF     LOS: 15 days    Kristin Giovanni PA-C Pager [phone] 08/04/2020

## 2020-08-04 NOTE — Progress Notes (Signed)
Mobility Specialist: Progress Note   08/04/20 1639  Mobility  Activity Refused mobility   Pt up in chair with dinner. Pt said she is too worn out to do anything. Encouraged pt to continue IS use and to continue getting up and working with staff.   [facility] Tamla Winkels Mobility Specialist Mobility Specialist Phone: [phone]

## 2020-08-04 NOTE — Progress Notes (Signed)
CARDIAC REHAB PHASE I   PRE:  Rate/Rhythm: 77 paced  BP:  Sitting: 111/56      SaO2: 97 RA  MODE:  Ambulation: 50 ft +54f   POST:  Rate/Rhythm: 109 paced with PVCs  BP:  Sitting: 105/54 --> 105/66    SaO2: 100 RA   Pt agreeable to ambulated, states she feels better this am. Pt able to rise with one attempt today. Ambulated ~581fin hallway and requested to turn around. About halfway back to room, pt c/o "swimmy" head. Pt assisted to sit on rollator, and wheeled back to room. HR ranging from 66-110. Pt helped back into bed. Pt very fatigued and pale. RN aware. Encouraged ambulation later as able. Will continue to follow.  [id number] FalcoRN BSN 08/04/2020 9:55 AM

## 2020-08-04 NOTE — Progress Notes (Signed)
As per cardiac rehab, pt did not feel well during her walk this morning.  Her heart rate fluctuating between 60-110, concerns that her pacemaker may not be functioning properly. Cardiothoracic PA on call notified and plans to page EP team for review.

## 2020-08-04 NOTE — Progress Notes (Addendum)
Patient ID: ELDRA ACHEY, female   DOB: [birth year], 80 y.o.   MRN: [id number]    Cardiologist: Terrence Dupont  Advanced Heart Failure Rounding Note   Subjective:    - 3/24 NSTEMI - 3/24 Emergent CABG x 5 (LIMA to LAD, SEQ SVG to OM and Diagonal, SEQ to PL and PDA) - 3/25 take back to OR for tamponade. EF on TEE 55%, RV mildly down (3/25) -3/26 Extubated -3/28 Milrinone stopped. Midodrine started.  -3/30 S/P CRT-P.  -4/5 left thoracentesis, started on cefepime   Her breathing is slowly improving she says it's about the same as yesterday SpO2 a little better this am on room air.  Standing weight done today weight going in the right direction about 5lbs from admission weight.  Renal fx stable with diuresis.  BP tolerating spiro well. Using IS a little more.       Objective:   Weight Range:  Vital Signs:   Temp:  [98.2 F (36.8 C)-98.5 F (36.9 C)] 98.2 F (36.8 C) (04/07 0744) Pulse Rate:  [88-96] 95 (04/07 0744) Resp:  [12-18] 18 (04/07 0744) BP: (112-126)/(49-61) 120/56 (04/07 0744) SpO2:  [94 %-99 %] 97 % (04/07 0744) Weight:  [80.4 kg] 80.4 kg (04/07 0334) Last BM Date: 08/02/20  Weight change: Filed Weights   08/01/20 0425 08/03/20 0312 08/04/20 0334  Weight: 83.5 kg 83.6 kg 80.4 kg    Intake/Output:   Intake/Output Summary (Last 24 hours) at 08/04/2020 0755 Last data filed at 08/03/2020 2220 Gross per 24 hour  Intake 650 ml  Output --  Net 650 ml      PHYSICAL EXAM: Cardiac: JVD 8, normal rate and rhythm, clear s1 and s2, no murmurs, rubs or gallops, LE edema now pitting bilaterally 1+ Pulmonary: diminished in bases, rales up to mid lung fields bilaterally, shallow breaths, not in distress Abdominal: non distended abdomen, soft and nontender Psych: Alert, conversant, in better spirits   Telemetry: BiV pacing 80's-90s   Labs: Basic Metabolic Panel: Recent Labs  Lab 07/31/20 0137 08/01/20 0411 08/02/20 0145 08/03/20 0126 08/04/20 0213  NA 134* 139 141 136  140  K 3.9 3.9 4.4 4.3 4.0  CL 99 101 103 101 103  CO2 '28 27 28 27 27  '$ GLUCOSE 380* 127* 88 265* 96  BUN 33* 28* 31* 34* 34*  CREATININE 1.83* 1.72* 1.71* 1.81* 1.75*  CALCIUM 7.8* 8.1* 8.2* 7.9* 8.1*  MG 2.0 2.0 2.0 2.0 1.9    Liver Function Tests: No results for input(s): AST, ALT, ALKPHOS, BILITOT, PROT, ALBUMIN in the last 168 hours. No results for input(s): LIPASE, AMYLASE in the last 168 hours. No results for input(s): AMMONIA in the last 168 hours.  CBC: Recent Labs  Lab 07/31/20 0137 08/01/20 0411 08/02/20 1131 08/03/20 0126 08/04/20 0213  WBC 9.2 11.7* 10.8* 9.0 8.5  HGB 8.3* 8.5* 8.6* 8.2* 8.7*  HCT 26.3* 27.5* 27.6* 25.9* 27.5*  MCV 99.2 103.0* 102.2* 101.6* 100.7*  PLT 282 346 412* 394 404*    Cardiac Enzymes: No results for input(s): CKTOTAL, CKMB, CKMBINDEX, TROPONINI in the last 168 hours.  BNP: BNP (last 3 results) No results for input(s): BNP in the last 8760 hours.  ProBNP (last 3 results) No results for input(s): PROBNP in the last 8760 hours.    Other results:  Imaging: DG Chest 1 View  Result Date: 08/02/2020 CLINICAL DATA:  Follow-up left thoracentesis. EXAM: CHEST  1 VIEW COMPARISON:  08/01/2020 FINDINGS: Previous median sternotomy. Pacemaker appears unchanged. There is  less pleural fluid on the left. Small amount does persist. No evidence of pneumothorax. Small amount of pleural fluid on the right as seen previously. IMPRESSION: Less pleural fluid on the left. No pneumothorax following thoracentesis. Electronically Signed   By: Nelson Chimes M.D.   On: 08/02/2020 11:45   IR THORACENTESIS ASP PLEURAL SPACE W/IMG GUIDE  Result Date: 08/02/2020 INDICATION: Recent CABG. Left-sided pleural effusion. Request therapeutic thoracentesis. EXAM: ULTRASOUND GUIDED LEFT THORACENTESIS MEDICATIONS: None. COMPLICATIONS: None immediate. PROCEDURE: An ultrasound guided thoracentesis was thoroughly discussed with the patient and questions answered. The benefits,  risks, alternatives and complications were also discussed. The patient understands and wishes to proceed with the procedure. Written consent was obtained. Ultrasound was performed to localize and mark an adequate pocket of fluid in the left chest. The area was then prepped and draped in the normal sterile fashion. 1% Lidocaine was used for local anesthesia. Under ultrasound guidance a 6 Fr Safe-T-Centesis catheter was introduced. Thoracentesis was performed. The catheter was removed and a dressing applied. FINDINGS: A total of approximately 1.2 L of serosanguineous fluid was removed. IMPRESSION: Successful ultrasound guided left thoracentesis yielding 1.2 L of pleural fluid. Read by: Ascencion Dike PA-C Electronically Signed   By: Corrie Mckusick D.O.   On: 08/02/2020 11:53     Medications:     Scheduled Medications: . acetaminophen (TYLENOL) oral liquid 160 mg/5 mL  1,000 mg Oral Q6H   Or  . acetaminophen  1,000 mg Oral Q6H  . aspirin  81 mg Oral Daily  . atorvastatin  80 mg Oral q1800  . bisacodyl  10 mg Oral Daily   Or  . bisacodyl  10 mg Rectal Daily  . Chlorhexidine Gluconate Cloth  6 each Topical Daily  . clopidogrel  75 mg Oral Daily  . docusate sodium  200 mg Oral Daily  . enoxaparin (LOVENOX) injection  30 mg Subcutaneous QHS  . feeding supplement  237 mL Oral TID BM  . furosemide  80 mg Intravenous BID  . insulin aspart  0-24 Units Subcutaneous TID WC  . insulin aspart  5 Units Subcutaneous TID WC  . insulin detemir  22 Units Subcutaneous Daily  . mouth rinse  15 mL Mouth Rinse BID  . megestrol  40 mg Oral Daily  . melatonin  3 mg Oral QHS  . midodrine  5 mg Oral TID WC  . multivitamin with minerals  1 tablet Oral Daily  . pantoprazole sodium  40 mg Oral Daily   Or  . pantoprazole  40 mg Oral Daily  . polyethylene glycol  17 g Oral Daily  . potassium chloride  20 mEq Oral BID  . QUEtiapine  50 mg Oral QHS  . sodium chloride flush  3 mL Intravenous Q12H  . spironolactone   12.5 mg Oral Daily    Infusions: . ceFEPime (MAXIPIME) IV Stopped (08/03/20 2210)    PRN Medications: acetaminophen, dextrose, guaiFENesin-dextromethorphan, lidocaine, magic mouthwash, metoprolol tartrate, ondansetron (ZOFRAN) IV, oxyCODONE, traMADol   Assessment/Plan:   1. Ischemic Cardiomyopathy, Cardiogenic shock - s/p CABG 3/24 with take back for tamponade on 3/25 - LVEF 55% on post-op TEE 3/25 - Off pressors. Off milrinone - continue midodrine 5 mg three times a day, continue spironolactone 12.'5mg'$ , GDMT limited by bp - started back on IV lasix 08/01/2020 now on 80 BID would continue for today - Repeat limited echo on 08/01/2020 with EF 40-45%  2. CAD s/p NSTEMI  - CABG x 5 3/24 - no evidence of  ongoing ischemia - continue DAPT, statin. No bb with shock.   3. Acute hypoxic respiratory failure post-op/ left pleural effusion  - Stable on room air but endorses mild dyspnea - CXR w/ moderate left pleural effusion had left thoracentesis 4/5 no fluid analysis performed - Continue IV lasix - treating for  aspiration pna with cefepime procal trending in right direction and wbc count normal now -Continued to encourage IS use   4. AKI - baseline creatinine 1.1 -> peaked at 2>1.4-> 1.7->1.7>1.63>1.8>1.7>1.8>1.7 - due to shock/ATN - continue to monitor with diuresis  5. Acute blood loss anemia, post -op - stable at 8.7  6. Hypernatremia -resolved  7. Thrombocytopenia - resolved  8. Dysphagia - Speech following now on dysphagia 3 diet   9. Deconditioning - c/w PT/OT - insurance denied CIR  - Plan d/c to SNF when medically ready   10 CHB - EP consulted.  - S/P CRT-P-- 07/27/20   Length of Stay: [place]  PA-C 08/04/2020, 7:55 AM  Advanced Heart Failure Team Pager [phone] (M-F; 7a - 4p)  Please contact [facility] for night-coverage after hours (4p -7a ) and weekends on [email or web address]  Patient seen and examined with the above-signed Advanced Practice  Provider and/or Housestaff. I personally reviewed laboratory data, imaging studies and relevant notes. I independently examined the patient and formulated the important aspects of the plan. I have edited the note to reflect any of my changes or salient points. I have personally discussed the plan with the patient and/or family.  Feeling better with diuresis and abx. Weight coming back down to baseline (still 5 pounds up). Had some AF today.   General:  Sitting up in bed. No resp difficulty HEENT: normal Neck: supple. JVP 8. Carotids 2+ bilat; no bruits. No lymphadenopathy or thryomegaly appreciated. Cor: PMI nondisplaced. Regular rate & rhythm. No rubs, gallops or murmurs. Lungs: crackles at bases Abdomen: soft, nontender, nondistended. No hepatosplenomegaly. No bruits or masses. Good bowel sounds. Extremities: no cyanosis, clubbing, rash, 1+ edema Neuro: alert & orientedx3, cranial nerves grossly intact. moves all 4 extremities w/o difficulty. Affect pleasant  Continue IV abx and IV diuresis. Watch AF. May need AC if persists. Continue IS and ambulation.   [name], MD  4:31 PM

## 2020-08-04 NOTE — Care Management Important Message (Signed)
Important Message  Patient Details  Name: Kristin [name] MRN: [id number] Date of Birth: December 01, 1940   Medicare Important Message Given:  Yes     [name] 08/04/2020, 11:10 AM

## 2020-08-04 NOTE — Progress Notes (Signed)
Occupational Therapy Treatment Patient Details Name: Kristin [name] MRN: [id number] DOB: 13-Sep-1940 Today's Date: 08/04/2020    History of present illness Kristin [name] is a 80 y/o female admitted 3/23 from [place] with emergent cath then to ICU on 07/21/20. Pt with NSTEMI and underwent CABGx5 on 07/22/20. Post-op tamponade requiring return to OR on 3/25 for hematoma evacuation and then extubated on 3/26. Underwent BIV pacemaker insertion 3/30. PMH includes DM,HTN, HLD, and history of gallbladder surgery.   OT comments  Patient with difficult start to her day today.  See cardiac rehab note 4/7.  But patient willing to transfer out of bed to the recliner and perform light grooming task.  Patient was able to transfer with [facility] and support of L UE by OT, and completed seated grooming with setup only.  Patient remains in active a-fib, but no dizziness noted as reported earlier.  Patient plans on SNF for short term rehab prior to home.  Patient lives alone and has only intermittent assist.  She currently needs to much assist for ADL and mobility to transition home with only PRN assist and HH.   OT will continue to follow in the acute setting to maximize functional status.    Follow Up Recommendations  SNF    Equipment Recommendations  3 in 1 bedside commode    Recommendations for Other Services      Precautions / Restrictions Precautions Precautions: Sternal;ICD/Pacemaker Restrictions Other Position/Activity Restrictions: sternal precs, PPM precs but allowed to WBAT with elbows tucked in       Mobility Bed Mobility Overal bed mobility: Modified Independent               Patient Response: Cooperative  Transfers Overall transfer level: Needs assistance   Transfers: Sit to/from Stand;Stand Pivot Transfers Sit to Stand: Min guard Stand pivot transfers: Min guard       General transfer comment: no RW this date    Balance Overall balance assessment: Needs  assistance Sitting-balance support: Feet supported Sitting balance-Leahy Scale: Good     Standing balance support: Single extremity supported Standing balance-Leahy Scale: Poor Standing balance comment: reliant on external assist                           ADL either performed or assessed with clinical judgement   ADL       Grooming: Wash/dry hands;Wash/dry face;Oral care;Set up;Sitting                               Functional mobility during ADLs: Min guard       Vision       Perception     Praxis      Cognition Arousal/Alertness: Awake/alert Behavior During Therapy: WFL for tasks assessed/performed;Anxious;Flat affect Overall Cognitive Status: Within Functional Limits for tasks assessed                                                            Pertinent Vitals/ Pain       Pain Assessment: No/denies pain Pain Intervention(s): Monitored during session  Frequency  Min 2X/week        Progress Toward Goals  OT Goals(current goals can now be found in the care plan section)  Progress towards OT goals: Progressing toward goals  Acute Rehab OT Goals Patient Stated Goal: feel better and be able to move like I want to. OT Goal Formulation: With patient Time For Goal Achievement: 08/09/20 Potential to Achieve Goals: Good  Plan Discharge plan remains appropriate    Co-evaluation                 AM-PAC OT "6 Clicks" Daily Activity     Outcome Measure   Help from another person eating meals?: None Help from another person taking care of personal grooming?: None Help from another person toileting, which includes using toliet, bedpan, or urinal?: A Little Help from another person bathing (including washing, rinsing, drying)?: A Lot Help from another person to put on and taking off regular upper body clothing?: A Little Help  from another person to put on and taking off regular lower body clothing?: A Lot 6 Click Score: 18    End of Session    OT Visit Diagnosis: Unsteadiness on feet (R26.81);Other abnormalities of gait and mobility (R26.89);Muscle weakness (generalized) (M62.81)   Activity Tolerance Patient tolerated treatment well   Patient Left in chair;with call bell/phone within reach   Nurse Communication          Time: [id number] OT Time Calculation (min): 17 min  Charges: OT General Charges $OT Visit: 1 Visit OT Treatments $Self Care/Home Management : 8-22 mins  08/04/2020  Rich, OTR/L  Acute Rehabilitation Services  Office:  [place] 08/04/2020, 3:45 PM

## 2020-08-04 NOTE — Progress Notes (Addendum)
Physical Therapy Treatment Patient Details Name: Kristin [name] MRN: [id number] DOB: [birth year] Today's Date: 08/04/2020    History of Present Illness Kristin [name] is a 80 y/o female admitted 3/23 from [place] with emergent cath then to ICU on 07/21/20. Pt with NSTEMI and underwent CABGx5 on 07/22/20. Post-op tamponade requiring return to OR on 3/25 for hematoma evacuation and then extubated on 3/26. Underwent BIV pacemaker insertion 3/30. PMH includes DM,HTN, HLD, and history of gallbladder surgery.    PT Comments    Pt received in chair with daughter also present, pt agreeable to therapy session with heavy encouragement and with fair participation and tolerance for household distance gait trial. Pt continues to need multimodal cues for safety with transfers and unable to state sternal precautions/move in the tube when prompted, unless given multiple hints. Pt internally distracted due to fatigue/pain and anxiety surrounding mobility tasks but participates with increased time/encouragement given. Pt needing increased assist to stand from chair during PT session as compared with OT session in AM likely due to fatigue. Pt continues to benefit from PT services to progress toward functional mobility goals. Pt with slow progress toward goals and would likely benefit from post-acute rehab in a low to moderate intensity setting, discussed with supervising PT Katie F and pt/family in agreement to SNF.  Follow Up Recommendations  Supervision for mobility/OOB;SNF     Equipment Recommendations  Rolling walker with 5" wheels;3in1 (PT)    Recommendations for Other Services       Precautions / Restrictions Precautions Precautions: Sternal;ICD/Pacemaker Restrictions Weight Bearing Restrictions: No Other Position/Activity Restrictions: sternal precs, PPM precs but allowed to WBAT with elbows tucked in    Mobility  Bed Mobility Overal bed mobility: Modified Independent             General bed mobility  comments: received in chair and remained on toilet    Transfers Overall transfer level: Needs assistance   Transfers: Sit to/from Stand Sit to Stand: Mod assist Stand pivot transfers: Min guard       General transfer comment: pt c/o significant fatigue, attempted to stand with min guard from recliner then minA but unable to achieve upright posture until given modA to rise with hands pushing on knees per precautions  Ambulation/Gait Ambulation/Gait assistance: Min assist Gait Distance (Feet): 55 Feet Assistive device: Rolling walker (2 wheeled) Gait Pattern/deviations: Step-through pattern Gait velocity: decreased   General Gait Details: cues needed for proximity within RW and minA to manage device in narrow spaces; no overt LOB however some postural sway and poor RW mgmt needing minA while turning at times   Stairs             Wheelchair Mobility    Modified Rankin (Stroke Patients Only)       Balance Overall balance assessment: Needs assistance Sitting-balance support: Feet supported Sitting balance-Leahy Scale: Good     Standing balance support: Bilateral upper extremity supported Standing balance-Leahy Scale: Poor Standing balance comment: reliant on external assist                            Cognition Arousal/Alertness: Awake/alert Behavior During Therapy: WFL for tasks assessed/performed;Anxious;Flat affect Overall Cognitive Status: Within Functional Limits for tasks assessed    General Comments: moderately anxious regarding mobility, initially refusing but with instruction on benefits, agreeable to attempt      Exercises      General Comments General comments (skin integrity, edema, etc.): SpO2 94% on  RA with exertion, improves to 100% resting; HR 70's-80's bpm      Pertinent Vitals/Pain Pain Assessment: Faces Faces Pain Scale: Hurts even more Pain Location: incisional with coughing Pain Descriptors / Indicators:  Discomfort;Grimacing;Moaning (manual splinting) Pain Intervention(s): Monitored during session;Repositioned;Limited activity within patient's tolerance    PT Goals (current goals can now be found in the care plan section) Acute Rehab PT Goals Patient Stated Goal: feel better and be able to move like I want to. PT Goal Formulation: With patient Time For Goal Achievement: 08/09/20 Potential to Achieve Goals: Good Progress towards PT goals: Progressing toward goals    Frequency    Min 2X/week      PT Plan Discharge plan needs to be updated;Frequency needs to be updated       AM-PAC PT "6 Clicks" Mobility   Outcome Measure  Help needed turning from your back to your side while in a flat bed without using bedrails?: A Little Help needed moving from lying on your back to sitting on the side of a flat bed without using bedrails?: A Lot Help needed moving to and from a bed to a chair (including a wheelchair)?: A Lot Help needed standing up from a chair using your arms (e.g., wheelchair or bedside chair)?: A Lot Help needed to walk in hospital room?: A Little Help needed climbing 3-5 steps with a railing? : A Lot 6 Click Score: 14    End of Session Equipment Utilized During Treatment: Gait belt Activity Tolerance: Patient limited by fatigue;Patient limited by pain Patient left: in chair;with call bell/phone within reach;with family/visitor present (seated on BSC over toilet, daughter present in room and NT/RN notified pt to pull call bell in bathroom when ready to get up/back to bed) Nurse Communication: Mobility status PT Visit Diagnosis: Unsteadiness on feet (R26.81);Muscle weakness (generalized) (M62.81);Difficulty in walking, not elsewhere classified (R26.2)     Time: [id number] PT Time Calculation (min) (ACUTE ONLY): 12 min  Charges:  $Gait Training: 8-22 mins                     Marshell Rieger P., PTA Acute Rehabilitation Services Pager: [phone] Office:  [place] 08/04/2020, 6:00 PM

## 2020-08-05 DIAGNOSIS — Z951 Presence of aortocoronary bypass graft: Secondary | ICD-10-CM | POA: Diagnosis not present

## 2020-08-05 DIAGNOSIS — I214 Non-ST elevation (NSTEMI) myocardial infarction: Secondary | ICD-10-CM | POA: Diagnosis not present

## 2020-08-05 DIAGNOSIS — I5021 Acute systolic (congestive) heart failure: Secondary | ICD-10-CM | POA: Diagnosis not present

## 2020-08-05 LAB — CBC
HCT: 27.1 % — ABNORMAL LOW (ref 36.0–46.0)
Hemoglobin: 8.5 g/dL — ABNORMAL LOW (ref 12.0–15.0)
MCH: 30.8 pg (ref 26.0–34.0)
MCHC: 31.4 g/dL (ref 30.0–36.0)
MCV: 98.2 fL (ref 80.0–100.0)
Platelets: 426 10*3/uL — ABNORMAL HIGH (ref 150–400)
RBC: 2.76 MIL/uL — ABNORMAL LOW (ref 3.87–5.11)
RDW: 18.5 % — ABNORMAL HIGH (ref 11.5–15.5)
WBC: 7.1 10*3/uL (ref 4.0–10.5)
nRBC: 0 % (ref 0.0–0.2)

## 2020-08-05 LAB — GLUCOSE, CAPILLARY
Glucose-Capillary: 242 mg/dL — ABNORMAL HIGH (ref 70–99)
Glucose-Capillary: 194 mg/dL — ABNORMAL HIGH (ref 70–99)
Glucose-Capillary: 135 mg/dL — ABNORMAL HIGH (ref 70–99)
Glucose-Capillary: 184 mg/dL — ABNORMAL HIGH (ref 70–99)

## 2020-08-05 LAB — BASIC METABOLIC PANEL
Anion gap: 10 (ref 5–15)
BUN: 34 mg/dL — ABNORMAL HIGH (ref 8–23)
CO2: 28 mmol/L (ref 22–32)
Calcium: 8.1 mg/dL — ABNORMAL LOW (ref 8.9–10.3)
Chloride: 101 mmol/L (ref 98–111)
Creatinine, Ser: 1.94 mg/dL — ABNORMAL HIGH (ref 0.44–1.00)
GFR, Estimated: 26 mL/min — ABNORMAL LOW (ref >60)
Glucose, Bld: 260 mg/dL — ABNORMAL HIGH (ref 70–99)
Potassium: 3.8 mmol/L (ref 3.5–5.1)
Sodium: 139 mmol/L (ref 135–145)

## 2020-08-05 LAB — MAGNESIUM: Magnesium: 2.1 mg/dL (ref 1.7–2.4)

## 2020-08-05 MED ORDER — SPIRONOLACTONE 12.5 MG HALF TABLET
12.5000 mg | ORAL_TABLET | Freq: Every day | ORAL | Status: DC
  Administered 2020-08-05 – 2020-08-10 (×6): 12.5 mg via ORAL
  Filled 2020-08-05 (×6): qty 1

## 2020-08-05 MED ORDER — SODIUM CHLORIDE 0.9 % IV SOLN
2.0000 g | INTRAVENOUS | Status: DC
  Filled 2020-08-05: qty 2

## 2020-08-05 NOTE — Progress Notes (Addendum)
Patient ID: Kristin [name], female   DOB: 12-04-1940, 80 y.o.   MRN: [id number]    Cardiologist: Terrence Dupont  Advanced Heart Failure Rounding Note   Subjective:    - 3/24 NSTEMI - 3/24 Emergent CABG x 5 (LIMA to LAD, SEQ SVG to OM and Diagonal, SEQ to PL and PDA) - 3/25 take back to OR for tamponade. EF on TEE 55%, RV mildly down (3/25) -3/26 Extubated -3/28 Milrinone stopped. Midodrine started.  -3/30 S/P CRT-P.  -4/5 left thoracentesis, started on cefepime   Weight down only a pound despite good output now at 176lbs.  172 on admission.  Cr up today 1.75>1.94.  Episode of afib after I saw her yesterday.  Midodrine discontinued yesterday, bp lower but adequate.  She says her breathing is a little better, trying to give a good effort with PT but is still very weak.     Objective:   Weight Range:  Vital Signs:   Temp:  [98 F (36.7 C)-99.3 F (37.4 C)] 98.4 F (36.9 C) (04/08 0738) Pulse Rate:  [74-155] 93 (04/08 0738) Resp:  [20] 20 (04/08 0738) BP: (110-122)/(51-73) 111/55 (04/08 0738) SpO2:  [94 %-99 %] 99 % (04/08 0738) Weight:  [79.9 kg] 79.9 kg (04/08 0427) Last BM Date: 08/02/20  Weight change: Filed Weights   08/03/20 0312 08/04/20 0334 08/05/20 0427  Weight: 83.6 kg 80.4 kg 79.9 kg    Intake/Output:   Intake/Output Summary (Last 24 hours) at 08/05/2020 0800 Last data filed at 08/05/2020 0510 Gross per 24 hour  Intake --  Output 2725 ml  Net -2725 ml      PHYSICAL EXAM: Cardiac: normal rate and rhythm, clear s1 and s2, no murmurs, rubs or gallops, LE edema  pitting bilaterally 1+ Pulmonary: diminished in bases, rales up to mid lung fields bilaterally, shallow breaths, not in distress Abdominal: non distended abdomen, soft and nontender Psych: Alert, conversant, in good spirits   Telemetry: BiV pacing 80's-90s   Labs: Basic Metabolic Panel: Recent Labs  Lab 08/01/20 0411 08/02/20 0145 08/03/20 0126 08/04/20 0213 08/05/20 0206  NA 139 141 136 140 139   K 3.9 4.4 4.3 4.0 3.8  CL 101 103 101 103 101  CO2 '27 28 27 27 28  '$ GLUCOSE 127* 88 265* 96 260*  BUN 28* 31* 34* 34* 34*  CREATININE 1.72* 1.71* 1.81* 1.75* 1.94*  CALCIUM 8.1* 8.2* 7.9* 8.1* 8.1*  MG 2.0 2.0 2.0 1.9 2.1    Liver Function Tests: No results for input(s): AST, ALT, ALKPHOS, BILITOT, PROT, ALBUMIN in the last 168 hours. No results for input(s): LIPASE, AMYLASE in the last 168 hours. No results for input(s): AMMONIA in the last 168 hours.  CBC: Recent Labs  Lab 08/01/20 0411 08/02/20 1131 08/03/20 0126 08/04/20 0213 08/05/20 0206  WBC 11.7* 10.8* 9.0 8.5 7.1  HGB 8.5* 8.6* 8.2* 8.7* 8.5*  HCT 27.5* 27.6* 25.9* 27.5* 27.1*  MCV 103.0* 102.2* 101.6* 100.7* 98.2  PLT 346 412* 394 404* 426*    Cardiac Enzymes: No results for input(s): CKTOTAL, CKMB, CKMBINDEX, TROPONINI in the last 168 hours.  BNP: BNP (last 3 results) No results for input(s): BNP in the last 8760 hours.  ProBNP (last 3 results) No results for input(s): PROBNP in the last 8760 hours.    Other results:  Imaging: No results found.   Medications:     Scheduled Medications: . acetaminophen (TYLENOL) oral liquid 160 mg/5 mL  1,000 mg Oral Q6H   Or  .  acetaminophen  1,000 mg Oral Q6H  . aspirin  81 mg Oral Daily  . atorvastatin  80 mg Oral q1800  . bisacodyl  10 mg Oral Daily   Or  . bisacodyl  10 mg Rectal Daily  . clopidogrel  75 mg Oral Daily  . docusate sodium  200 mg Oral Daily  . enoxaparin (LOVENOX) injection  30 mg Subcutaneous QHS  . feeding supplement  237 mL Oral TID BM  . insulin aspart  0-24 Units Subcutaneous TID WC  . insulin aspart  5 Units Subcutaneous TID WC  . insulin detemir  22 Units Subcutaneous Daily  . mouth rinse  15 mL Mouth Rinse BID  . megestrol  40 mg Oral Daily  . melatonin  3 mg Oral QHS  . multivitamin with minerals  1 tablet Oral Daily  . pantoprazole sodium  40 mg Oral Daily   Or  . pantoprazole  40 mg Oral Daily  . polyethylene glycol   17 g Oral Daily  . potassium chloride  20 mEq Oral BID  . QUEtiapine  50 mg Oral QHS  . sodium chloride flush  3 mL Intravenous Q12H  . spironolactone  12.5 mg Oral Daily    Infusions: . ceFEPime (MAXIPIME) IV 2 g (08/04/20 2116)    PRN Medications: acetaminophen, dextrose, guaiFENesin-dextromethorphan, lidocaine, magic mouthwash, metoprolol tartrate, ondansetron (ZOFRAN) IV, oxyCODONE, traMADol   Assessment/Plan:   1. Ischemic Cardiomyopathy, Cardiogenic shock - s/p CABG 3/24 with take back for tamponade on 3/25 - LVEF 55% on post-op TEE 3/25 - Off pressors. Off milrinone - continue midodrine 5 mg three times a day, continue spironolactone 12.'5mg'$ , GDMT limited by bp - started back on IV lasix 08/01/2020 80BID, Cr and bp a little worse today hold diuretics but continue spiro.  Drinking a lot of fluid will start fluid restriction.   - Repeat limited echo on 08/01/2020 with EF 40-45%  2. CAD s/p NSTEMI  - CABG x 5 3/24 - no evidence of ongoing ischemia - continue DAPT, statin. No bb with shock.   3. Acute hypoxic respiratory failure post-op/ left pleural effusion  - Stable on room air but endorses mild dyspnea - CXR w/ moderate left pleural effusion had left thoracentesis 4/5 no fluid analysis performed - will need more diuresis - treating for aspiration pna with cefepime procal trending in right direction and wbc count normal now -Continued to encourage IS use   4. AKI - baseline creatinine 1.1 -> peaked at 2>1.4-> 1.7->1.7>1.63>1.8>1.7>1.8>1.7 - due to shock/ATN - continue to monitor with diuresis  5. Acute blood loss anemia, post -op - stable at 8.5  6. Hypernatremia -resolved  7. Thrombocytopenia - resolved  8. Dysphagia - Speech following now on dysphagia 3 diet   9. Deconditioning - c/w PT/OT - insurance denied CIR  - Plan d/c to SNF when medically ready   10 CHB - EP consulted.  - S/P CRT-P-- 07/27/20   11 PAF -continue monitoring telemetry, had an  episode 4/7, may need ac at some point  Length of Stay: [place]  PA-C 08/05/2020, 8:00 AM  Advanced Heart Failure Team Pager [phone] (M-F; [place])  Please contact [facility] for night-coverage after hours (4p -7a ) and weekends on [email or web address]  Patient seen and examined with the above-signed Advanced Practice Provider and/or Housestaff. I personally reviewed laboratory data, imaging studies and relevant notes. I independently examined the patient and formulated the important aspects of the plan. I  have edited the note to reflect any of my changes or salient points. I have personally discussed the plan with the patient and/or family.  Remains weak but improving slowly. Breathing feels better. Weight nearing baseline with diuresis. No f/c. Creatinine climbing   General:  Sitting up in bed No resp difficulty HEENT: normal Neck: supple. JVP hard to see. Carotids 2+ bilat; no bruits. No lymphadenopathy or thryomegaly appreciated. Cor: PMI nondisplaced. Regular rate & rhythm. No rubs, gallops or murmurs. Lungs: decreased at bases Abdomen: soft, nontender, nondistended. No hepatosplenomegaly. No bruits or masses. Good bowel sounds. Extremities: no cyanosis, clubbing, rash, edema Neuro: alert & orientedx3, cranial nerves grossly intact. moves all 4 extremities w/o difficulty. Affect pleasant  I think she is as dry as we can get her. Will hold IV lasix today and consider starting po in a few days. Will need to have end date for abx.   Hopefully we can get her to SNF on Monday,   [name], MD  7:04 PM

## 2020-08-05 NOTE — Progress Notes (Addendum)
[address]       [place] [address]             [phone]      9 Days Post-Op Procedure(s) (LRB): BIV PACEMAKER INSERTION CRT-P (N/A) Subjective: Didn't sleep well but overall conts to feel better  Objective: Vital signs in last 24 hours: Temp:  [98 F (36.7 C)-99.3 F (37.4 C)] 98.1 F (36.7 C) (04/08 0417) Pulse Rate:  [74-155] 91 (04/08 0417) Cardiac Rhythm: Ventricular paced (04/07 1900) Resp:  [18-20] 20 (04/08 0417) BP: (110-122)/(51-73) 113/54 (04/08 0417) SpO2:  [94 %-99 %] 99 % (04/08 0417) Weight:  [79.9 kg] 79.9 kg (04/08 0427)  Hemodynamic parameters for last 24 hours:    Intake/Output from previous day: 04/07 0701 - 04/08 0700 In: -  Out: 2725 [Urine:2725] Intake/Output this shift: No intake/output data recorded.  General appearance: alert, cooperative and no distress Heart: regular rate and rhythm Lungs: mildly dim in bases Abdomen: benign Extremities: no edema Wound: incis healing well  Lab Results: Recent Labs    08/04/20 0213 08/05/20 0206  WBC 8.5 7.1  HGB 8.7* 8.5*  HCT 27.5* 27.1*  PLT 404* 426*   BMET:  Recent Labs    08/04/20 0213 08/05/20 0206  NA 140 139  K 4.0 3.8  CL 103 101  CO2 27 28  GLUCOSE 96 260*  BUN 34* 34*  CREATININE 1.75* 1.94*  CALCIUM 8.1* 8.1*    PT/INR: No results for input(s): LABPROT, INR in the last 72 hours. ABG    Component Value Date/Time   PHART 7.442 07/23/2020 0434   HCO3 29.2 (H) 07/23/2020 0434   TCO2 31 07/23/2020 0434   ACIDBASEDEF 1.0 07/22/2020 0814   O2SAT 59.9 07/26/2020 0357   CBG (last 3)  Recent Labs    08/04/20 1657 08/04/20 2113 08/05/20 0604  GLUCAP 164* 221* 242*    Meds Scheduled Meds: . acetaminophen (TYLENOL) oral liquid 160 mg/5 mL  1,000 mg Oral Q6H   Or  . acetaminophen  1,000 mg Oral Q6H  . aspirin  81 mg Oral Daily  . atorvastatin  80 mg Oral q1800  . bisacodyl  10 mg Oral Daily   Or  . bisacodyl  10 mg Rectal Daily  .  clopidogrel  75 mg Oral Daily  . docusate sodium  200 mg Oral Daily  . enoxaparin (LOVENOX) injection  30 mg Subcutaneous QHS  . feeding supplement  237 mL Oral TID BM  . insulin aspart  0-24 Units Subcutaneous TID WC  . insulin aspart  5 Units Subcutaneous TID WC  . insulin detemir  22 Units Subcutaneous Daily  . mouth rinse  15 mL Mouth Rinse BID  . megestrol  40 mg Oral Daily  . melatonin  3 mg Oral QHS  . multivitamin with minerals  1 tablet Oral Daily  . pantoprazole sodium  40 mg Oral Daily   Or  . pantoprazole  40 mg Oral Daily  . polyethylene glycol  17 g Oral Daily  . potassium chloride  20 mEq Oral BID  . QUEtiapine  50 mg Oral QHS  . sodium chloride flush  3 mL Intravenous Q12H  . spironolactone  12.5 mg Oral Daily   Continuous Infusions: . ceFEPime (MAXIPIME) IV 2 g (08/04/20 2116)   PRN Meds:.acetaminophen, dextrose, guaiFENesin-dextromethorphan, lidocaine, magic mouthwash, metoprolol tartrate, ondansetron (ZOFRAN) IV, oxyCODONE, traMADol  Xrays No results found.  Assessment/Plan: S/P Procedure(s) (LRB): BIV PACEMAKER INSERTION CRT-P (  N/A)   1 Tmax 99.3 , VSS, CRT-P 2 sats good on RA 3 slow but steady overall progress with PT 4 SNF being worked on by [facility]- transfer when bed available and medically cleared 5 conts to diurese , aggressive AHF management as per team 6 seen yesterday by EP- monitoring Afib and CRT-P therapy     LOS: 16 days    John Giovanni PA-C Pager [phone] 08/05/2020   I have seen and examined the patient and agree with the assessment and plan as outlined.  Rexene Alberts, MD 08/05/2020 5:23 PM

## 2020-08-05 NOTE — Progress Notes (Signed)
Inpatient Diabetes Program Recommendations  AACE/ADA: New Consensus Statement on Inpatient Glycemic Control (2015)  Target Ranges:  Prepandial:   less than 140 mg/dL      Peak postprandial:   less than 180 mg/dL (1-2 hours)      Critically ill patients:  140 - 180 mg/dL   Lab Results  Component Value Date   GLUCAP 242 (H) 08/05/2020   HGBA1C 8.8 (H) 07/20/2020    Review of Glycemic Control Results for Kristin [name], Kristin [name] (MRN [id number]) as of 08/05/2020 09:43  Ref. Range 08/04/2020 06:14 08/04/2020 11:07 08/04/2020 16:57 08/04/2020 21:13 08/05/2020 06:04  Glucose-Capillary Latest Ref Range: 70 - 99 mg/dL 115 (H) 246 (H) 164 (H) 221 (H) 242 (H)   Diabetes history: DM 2 Outpatient Diabetes medications: Amaryl 1 mg Daily, Levemir 42 units Daily, Actos 30 mg Daily Current orders for Inpatient glycemic control:   Inpatient Diabetes Program Recommendations:    -  Increase Novolog meal coverage to 10 units tid if eating >50% of meals  -  Would advise against maybe restarting Actos at time of d/c as it can retain fluid in pts.  Thanks, Tama Headings RN, MSN, BC-ADM Inpatient Diabetes Coordinator Team Pager [phone] (8a-5p)

## 2020-08-05 NOTE — Progress Notes (Signed)
CARDIAC REHAB PHASE I   Went to offer to walk with pt. Pt currently getting up with mobility team. Will f/u later as time allows to encourage continued ambulation.  [name], RN BSN 08/05/2020 11:22 AM

## 2020-08-05 NOTE — TOC Progression Note (Addendum)
Transition of Care (TOC) - Progression Note  Heart Failure   Patient Details  Name: Kristin [name] MRN: [id number] Date of Birth: Sep 20, 1940  Transition of Care [facility]) CM/SW [place], [place] Phone Number: 08/05/2020, 10:10 AM  Clinical Narrative:    CSW called [facility] this morning and spoke with Tammy ([phone]. [phone]) in admissions who reported not having any SNF beds available until next week for rehab. CSW will continue seeking SNF placement for Ms. Bambrick.  TOC to continue to follow for d/c plans.    Expected Discharge Plan: [place] Barriers to Discharge: Continued Medical Work up  Expected Discharge Plan and Services Expected Discharge Plan: [place] In-house Referral: Clinical Social Work Discharge Planning Services: CM Consult Post Acute Care Choice: [place] arrangements for the past 2 months: Single Family Home                                       Social Determinants of Health (SDOH) Interventions    Readmission Risk Interventions No flowsheet data found.  Masin Shatto, MSW, [place] Heart Failure Social Worker

## 2020-08-05 NOTE — Progress Notes (Signed)
Mobility Specialist - Progress Note   08/05/20 1145  Mobility  Activity Ambulated in hall  Level of Assistance Standby assist, set-up cues, supervision of patient - no hands on  Assistive Device Four wheel walker  Distance Ambulated (ft) 100 ft  Mobility Response Tolerated well  Mobility performed by Mobility specialist  $Mobility charge 1 Mobility   Pre-mobility: 86 HR, 99/58 BP During mobility: 113 HR Post-mobility: 97 HR, 129/62 BP  Pt endorsed minor lightheadedness during ambulation and required one standing rest break due to fatigue. Pt to recliner after walk to wait for lunch, call bell at side.   Pricilla Handler Mobility Specialist Mobility Specialist Phone: [phone]

## 2020-08-06 DIAGNOSIS — Z951 Presence of aortocoronary bypass graft: Secondary | ICD-10-CM | POA: Diagnosis not present

## 2020-08-06 DIAGNOSIS — I214 Non-ST elevation (NSTEMI) myocardial infarction: Secondary | ICD-10-CM | POA: Diagnosis not present

## 2020-08-06 LAB — BASIC METABOLIC PANEL
Anion gap: 10 (ref 5–15)
BUN: 30 mg/dL — ABNORMAL HIGH (ref 8–23)
CO2: 22 mmol/L (ref 22–32)
Calcium: 8.4 mg/dL — ABNORMAL LOW (ref 8.9–10.3)
Chloride: 107 mmol/L (ref 98–111)
Creatinine, Ser: 1.89 mg/dL — ABNORMAL HIGH (ref 0.44–1.00)
GFR, Estimated: 27 mL/min — ABNORMAL LOW (ref >60)
Glucose, Bld: 208 mg/dL — ABNORMAL HIGH (ref 70–99)
Potassium: 3.8 mmol/L (ref 3.5–5.1)
Sodium: 139 mmol/L (ref 135–145)

## 2020-08-06 LAB — HEPATIC FUNCTION PANEL
ALT: 32 U/L (ref 0–44)
AST: 32 U/L (ref 15–41)
Albumin: 2.3 g/dL — ABNORMAL LOW (ref 3.5–5.0)
Alkaline Phosphatase: 85 U/L (ref 38–126)
Bilirubin, Direct: 0.2 mg/dL (ref 0.0–0.2)
Indirect Bilirubin: 0.8 mg/dL (ref 0.3–0.9)
Total Bilirubin: 1 mg/dL (ref 0.3–1.2)
Total Protein: 6 g/dL — ABNORMAL LOW (ref 6.5–8.1)

## 2020-08-06 LAB — GLUCOSE, CAPILLARY
Glucose-Capillary: 205 mg/dL — ABNORMAL HIGH (ref 70–99)
Glucose-Capillary: 149 mg/dL — ABNORMAL HIGH (ref 70–99)
Glucose-Capillary: 120 mg/dL — ABNORMAL HIGH (ref 70–99)
Glucose-Capillary: 189 mg/dL — ABNORMAL HIGH (ref 70–99)

## 2020-08-06 LAB — CBC
HCT: 28.3 % — ABNORMAL LOW (ref 36.0–46.0)
Hemoglobin: 8.9 g/dL — ABNORMAL LOW (ref 12.0–15.0)
MCH: 32 pg (ref 26.0–34.0)
MCHC: 31.4 g/dL (ref 30.0–36.0)
MCV: 101.8 fL — ABNORMAL HIGH (ref 80.0–100.0)
Platelets: 423 10*3/uL — ABNORMAL HIGH (ref 150–400)
RBC: 2.78 MIL/uL — ABNORMAL LOW (ref 3.87–5.11)
RDW: 18.3 % — ABNORMAL HIGH (ref 11.5–15.5)
WBC: 7.7 10*3/uL (ref 4.0–10.5)
nRBC: 0 % (ref 0.0–0.2)

## 2020-08-06 LAB — MAGNESIUM: Magnesium: 2.2 mg/dL (ref 1.7–2.4)

## 2020-08-06 MED ORDER — APIXABAN 5 MG PO TABS
5.0000 mg | ORAL_TABLET | Freq: Two times a day (BID) | ORAL | Status: DC
  Administered 2020-08-06 – 2020-08-10 (×9): 5 mg via ORAL
  Filled 2020-08-06 (×8): qty 1

## 2020-08-06 NOTE — Progress Notes (Signed)
CARDIAC REHAB PHASE I   PRE:  Rate/Rhythm: 103 Paced ST with PVC's   Went to check in with pt and ambulate. Mobility specialist with pt getting ready to ambulate. Will continue to follow.   Rick Duff, MS, ACSM-CEP 08/06/2020 12:03 PM

## 2020-08-06 NOTE — Progress Notes (Addendum)
      [address]       [place] [address]             [phone]        10 Days Post-Op Procedure(s) (LRB): BIV PACEMAKER INSERTION CRT-P (N/A)  Subjective: Patient states breathing is better when sitting up. She is not sleeping all that well.   Objective: Vital signs in last 24 hours: Temp:  [98.1 F (36.7 C)-98.6 F (37 C)] 98.5 F (36.9 C) (04/09 0745) Pulse Rate:  [65-97] 97 (04/09 0745) Cardiac Rhythm: Ventricular paced;Other (Comment) (04/08 2049) Resp:  [16-20] 18 (04/09 0745) BP: (102-129)/(49-66) 119/60 (04/09 0745) SpO2:  [96 %-99 %] 96 % (04/09 0745) Weight:  [79.9 kg] 79.9 kg (04/09 0322)  Pre op weight 78.1 kg Current Weight  08/06/20 79.9 kg       Intake/Output from previous day: 04/08 0701 - 04/09 0700 In: 1200 [P.O.:1200] Out: 1400 [Urine:1400]   Physical Exam:  Cardiovascular: V paced Pulmonary: Diminished bibasilar breath sounds Abdomen: Soft, non tender, bowel sounds present. Extremities: Trace bilateral lower extremity edema. Wounds: Sternal and bilateral LE wounds are clean and dry.  No erythema or signs of infection.  Lab Results: CBC: Recent Labs    08/05/20 0206 08/06/20 0117  WBC 7.1 7.7  HGB 8.5* 8.9*  HCT 27.1* 28.3*  PLT 426* 423*   BMET:  Recent Labs    08/05/20 0206 08/06/20 0117  NA 139 139  K 3.8 3.8  CL 101 107  CO2 28 22  GLUCOSE 260* 208*  BUN 34* 30*  CREATININE 1.94* 1.89*  CALCIUM 8.1* 8.4*    PT/INR:  Lab Results  Component Value Date   INR 1.6 (H) 07/21/2020   ABG:  INR: Will add last result for INR, ABG once components are confirmed Will add last 4 CBG results once components are confirmed  Assessment/Plan:  1. CV - S/p NSTEMI. Previous CHB-s/p CRT-P. Previous PAF. Vent paced. On  DAPT. No BB as has had previous cardiogenic shock. 2.  Pulmonary - On room air. Encourage incentive spirometer. 3. Volume Overload - On Spironolactone 12.5 mg daily 4.  Expected post op  acute blood loss anemia - H and H this am stable at 8.9 and 28.3 5. DM-CBGs 135/184/205. On Insulin. Pre op HGA1C 8.8. She was also on Actos and Glimepiride but will not restart secondary to elevated creatinine. Will need close medical follow up after discharge 6. AKI-creatinine this am decreased to 1.89 7. ID-on Cefepime for possible aspiration PNA. This was started on 04/05. Will finish today's dose then stop. 8. Deconditioned-continue PT/OT. Will need SNF when ready for discharge;hopefully, Monday.  Donielle M ZimmermanPA-C 08/06/2020,7:53 AM   I have seen and examined the patient and agree with the assessment and plan as outlined.  Rexene Alberts, MD 08/06/2020 10:47 AM

## 2020-08-06 NOTE — Progress Notes (Signed)
Progress Note  Patient Name: Kristin [name] Date of Encounter: 08/07/2020  [facility] Cardiologist: No primary care provider on file.   Subjective   - 3/24 NSTEMI - 3/24 Emergent CABG x 5 (LIMA to LAD, SEQ SVG to OM and Diagonal, SEQ to PL and PDA) - 3/25 take back to OR for tamponade. EF on TEE 55%, RV mildly down (3/25) -3/26 Extubated -3/28 Milrinone stopped. Midodrine started.  -3/30 S/P CRT-P.  -4/5 left thoracentesis, started on cefepime -4/9 had several episodes of Afib yesterday; started on apixaban  Continues to feel SOB with exertion but did ambulate the hallway to the nurses station twice today. CXR this AM with slight enlargement of left sided pleural effusion compared to prior.   Cr continues to improve 1.89-->1.75 off diuretics  Inpatient Medications    Scheduled Meds: . acetaminophen (TYLENOL) oral liquid 160 mg/5 mL  1,000 mg Oral Q6H   Or  . acetaminophen  1,000 mg Oral Q6H  . apixaban  5 mg Oral BID  . aspirin  81 mg Oral Daily  . atorvastatin  80 mg Oral q1800  . bisacodyl  10 mg Oral Daily   Or  . bisacodyl  10 mg Rectal Daily  . docusate sodium  200 mg Oral Daily  . feeding supplement  237 mL Oral TID BM  . insulin aspart  0-24 Units Subcutaneous TID WC  . insulin aspart  5 Units Subcutaneous TID WC  . insulin detemir  24 Units Subcutaneous Daily  . mouth rinse  15 mL Mouth Rinse BID  . megestrol  40 mg Oral Daily  . melatonin  3 mg Oral QHS  . multivitamin with minerals  1 tablet Oral Daily  . pantoprazole sodium  40 mg Oral Daily   Or  . pantoprazole  40 mg Oral Daily  . polyethylene glycol  17 g Oral Daily  . potassium chloride  20 mEq Oral BID  . QUEtiapine  50 mg Oral QHS  . sodium chloride flush  3 mL Intravenous Q12H  . spironolactone  12.5 mg Oral Daily   Continuous Infusions:  PRN Meds: acetaminophen, dextrose, guaiFENesin-dextromethorphan, lidocaine, magic mouthwash, metoprolol tartrate, ondansetron (ZOFRAN) IV, oxyCODONE,  traMADol   Vital Signs    Vitals:   08/06/20 1940 08/06/20 2310 08/07/20 0336 08/07/20 0805  BP: (!) 105/49 (!) 111/57 124/73 105/61  Pulse: 90 96 99 (!) 107  Resp: '19 20 19 18  '$ Temp: 98.6 F (37 C) 98.2 F (36.8 C) 98 F (36.7 C) 97.9 F (36.6 C)  TempSrc: Oral Oral Oral Oral  SpO2: 93% 100% 96% 96%  Weight:   78.6 kg   Height:        Intake/Output Summary (Last 24 hours) at 08/07/2020 1028 Last data filed at 08/07/2020 0800 Gross per 24 hour  Intake 220 ml  Output 1200 ml  Net -980 ml   Last 3 Weights 08/07/2020 08/06/2020 08/05/2020  Weight (lbs) 173 lb 4.5 oz 176 lb 3.2 oz 176 lb 3.2 oz  Weight (kg) 78.6 kg 79.924 kg 79.924 kg      Telemetry    V-paced; no Afib this AM - Personally Reviewed  ECG    No new tracing- Personally Reviewed  Physical Exam   GEN: No acute distress.   Neck: No JVD Cardiac: RRR, no murmurs, rubs, or gallops.  Respiratory: Diminished at left lung base. Better air movement today GI: Soft, nontender, non-distended  MS: No edema; No deformity. Neuro:  Nonfocal  Psych: Normal affect   Labs    High Sensitivity Troponin:   Recent Labs  Lab 07/20/20 1538 07/20/20 1729  TROPONINIHS 3,198* 3,282*      Chemistry Recent Labs  Lab 08/05/20 0206 08/06/20 0117 08/07/20 0058  NA 139 139 138  K 3.8 3.8 4.3  CL 101 107 109  CO2 '28 22 22  '$ GLUCOSE 260* 208* 233*  BUN 34* 30* 27*  CREATININE 1.94* 1.89* 1.75*  CALCIUM 8.1* 8.4* 8.3*  PROT  --  6.0*  --   ALBUMIN  --  2.3*  --   AST  --  32  --   ALT  --  32  --   ALKPHOS  --  85  --   BILITOT  --  1.0  --   GFRNONAA 26* 27* 29*  ANIONGAP '10 10 7     '$ Hematology Recent Labs  Lab 08/05/20 0206 08/06/20 0117 08/07/20 0058  WBC 7.1 7.7 7.5  RBC 2.76* 2.78* 2.85*  HGB 8.5* 8.9* 8.8*  HCT 27.1* 28.3* 28.9*  MCV 98.2 101.8* 101.4*  MCH 30.8 32.0 30.9  MCHC 31.4 31.4 30.4  RDW 18.5* 18.3* 18.0*  PLT 426* 423* 418*    BNPNo results for input(s): BNP, PROBNP in the last 168  hours.   DDimer No results for input(s): DDIMER in the last 168 hours.   Radiology    DG CHEST PORT 1 VIEW  Result Date: 08/07/2020 CLINICAL DATA:  Pleural effusion EXAM: PORTABLE CHEST 1 VIEW COMPARISON:  August 02, 2020 FINDINGS: There is a partially loculated pleural effusion on the left which appears slightly larger. There is a small right pleural effusion which appears partially loculated, unchanged. There is atelectatic change and questionable infiltrate left base. Stable cardiac prominence with pulmonary vascularity normal. Pacemaker leads attached to right atrium, right ventricle, and coronary sinus. Patient is status post median sternotomy. There is aortic atherosclerosis. No adenopathy. No bone lesions. IMPRESSION: Partially loculated pleural effusion on the left appears slightly larger than on most recent study. Small partially loculated pleural effusion right base appear stable. Atelectatic change with questionable superimposed pneumonia left base. Stable cardiac prominence with pacemaker leads unchanged. Aortic Atherosclerosis (ICD10-I70.0). Electronically Signed   By: Lowella Grip III M.D.   On: 08/07/2020 08:04    Cardiac Studies   TTE 08/01/20: IMPRESSIONS  1. Inferior basal septal distal anterior wall hypokinesis consistent with  multi vessel CAD. Left ventricular ejection fraction, by estimation, is 40  to 45%. The left ventricle has mildly decreased function. The left  ventricle demonstrates regional wall  motion abnormalities (see scoring diagram/findings for description). The  left ventricular internal cavity size was moderately dilated. Left  ventricular diastolic parameters are consistent with Grade I diastolic  dysfunction (impaired relaxation).  2. ? catheters in RA/RV . Right ventricular systolic function is normal.  The right ventricular size is normal. There is normal pulmonary artery  systolic pressure.  3. Left atrial size was mildly dilated.  4. Right  atrial size was mildly dilated.  5. A small pericardial effusion is present. The pericardial effusion is  lateral to the left ventricle and posterior to the left ventricle.  Moderate pleural effusion in the left lateral region.  6. The mitral valve is abnormal. Mild mitral valve regurgitation. No  evidence of mitral stenosis.  7. Tricuspid valve regurgitation is moderate.  8. The aortic valve is normal in structure. Aortic valve regurgitation is  trivial. No aortic stenosis is present.  9. The inferior  vena cava is dilated in size with >50% respiratory  variability, suggesting right atrial pressure of 8 mmHg.   Cath 07/21/20:  Mid LM lesion is 95% stenosed.  Ost Cx lesion is 99% stenosed.  Dist LM to Ost LAD lesion is 95% stenosed.  Mid LAD lesion is 95% stenosed.  Prox Cx lesion is 90% stenosed.  Prox RCA to Mid RCA lesion is 80% stenosed.  RPDA lesion is 80% stenosed.  RPAV lesion is 80% stenosed.  Ost LM lesion is 30% stenosed.   Patient Profile     80 y.o. female with history of DM, HTN, and obesity who presented with NSTEMI found to have critical LM and multivessel CAD prompting IABP placement and emergent CABG x5 on 07/21/20 with (LIMA to LAD, SEQ SVG to OM and Diagonal, SEQ to PL and PDA). Post op course complicated by shock found to have tamponade and was taken back to the OR on 07/22/20. Successfully extubated post-procedure and required milrinone. Underwent CRT-P for CHB on 3/30. Now weaned off milrinone and midodrine.   Assessment & Plan    1. Ischemic Cardiomyopathy, Cardiogenic shock - S/p CABG 3/24 with take back for tamponade on 3/25 - LVEF 55% on post-op TEE 3/25-->40-45% on TTE 08/01/20 - Off milrinone. Midodrine stopped and blood pressures remain stable - Continue spironolactone 12.'5mg'$ , GDMT limited by soft blood pressures - Off lasix due to rise in Cr; continues to improve off diuretic-->likely transition to PO in the next couple of days  2. CAD  s/p NSTEMI  - CABG x 5 3/24 as detailed above - No evidence of ongoing ischemia - Continue ASA and apixaban; plavix stopped due to need for eliquis - Continue statin - No BB/ACE/ARB due to soft blood pressures  3. Acute hypoxic respiratory failure post-op/ left pleural effusion  -S/p left sided thora on 4/5; CXR from today with mild interval increase in left sided effusion -CV surgery to determine if needs repeat thora -On cefepime for aspiration pneumonia -Continued to encourage IS use -Holding diuresis for now; likely PO in the next couple of days  4. AKI - improving - secondary to shock/ATN - baseline creatinine 1.1 -> peaked at 2>now 1.87  - holding diuresis as above with improvement in Cr overnight  5. Acute blood loss anemia, post -op - stable at 8.8 - continue to trend  6. Hypernatremia -resolved  7. Thrombocytopenia - resolved  8. Dysphagia - Speech following now on dysphagia 3 diet   9. Deconditioning - c/w PT/OT - insurance denied CIR  - Plan d/c to SNF when medically ready   10. CHB - EP consulted.  - S/P CRT-P-- 07/27/20  11. PAF - Had several recurrent episodes of Afib on the monitor on 4/7 and 4/9 - Started on apixaban '5mg'$  BID for [facility] - If rate control needed, likely would have to use amiodarone as blood pressures too soft to tolerate BB      For questions or updates, please contact [facility] Please consult [email or web address] for contact info under        Signed, Freada Bergeron, MD  08/07/2020, 10:28 AM

## 2020-08-06 NOTE — Progress Notes (Signed)
Mobility Specialist - Progress Note   08/06/20 1212  Mobility  Activity Ambulated in hall  Level of Assistance Moderate assist, patient does 50-74%  Assistive Device Four wheel walker  Distance Ambulated (ft) 120 ft  Mobility Response Tolerated well  Mobility performed by Mobility specialist  $Mobility charge 1 Mobility   Pre-mobility: 104 HR, 118/91 BP Post-mobility: 100 HR, 124/52 BP  Pt asx throughout ambulation. Pt to recliner after walk to eat lunch, daughter in room.  Pricilla Handler Mobility Specialist Mobility Specialist Phone: [phone]

## 2020-08-06 NOTE — Discharge Instructions (Signed)
Discharge Instructions:  1. You may shower, please wash incisions daily with soap and water and keep dry.  If you wish to cover wounds with dressing you may do so but please keep clean and change daily.  No tub baths or swimming until incisions have completely healed.  If your incisions become red or develop any drainage please call our office at [phone]  2. No Driving until cleared by Dr. Otho Perl office and you are no longer using narcotic pain medications  3. Monitor your weight daily.. Please use the same scale and weigh at same time... If you gain 5-10 lbs in 48 hours with associated lower extremity swelling, please contact our office at [phone]  4. Fever of 101.5 for at least 24 hours with no source, please contact our office at [phone]  5. Activity- up as tolerated, please walk at least 3 times per day.  Avoid strenuous activity, no lifting, pushing, or pulling with your arms over 8-10 lbs for a minimum of 6 weeks  6. If any questions or concerns arise, please do not hesitate to contact our office at [phone]       Supplemental Discharge Instructions for  Pacemaker/Defibrillator Patients   Activity No heavy lifting or vigorous activity with your left/right arm for 6 to 8 weeks.  Do not raise your left/right arm above your head for one week.  Gradually raise your affected arm as drawn below.              08/01/20                      08/02/20                      08/03/20                     08/04/20                      __  NO DRIVING until cleared to by ALL your surgical MD's.  WOUND CARE - Keep the wound area clean and dry.  Do not get this area wet , no showers until cleared to at your wound check visit - The tape/steri-strips on your wound will fall off; do not pull them off.  No bandage is needed on the site.  DO  NOT apply any creams, oils, or ointments to the wound area. - If you notice any drainage or discharge from the wound, any swelling or bruising at  the site, or you develop a fever > 101? F after you are discharged home, call the office at once.  Special Instructions - You are still able to use cellular telephones; use the ear opposite the side where you have your pacemaker/defibrillator.  Avoid carrying your cellular phone near your device. - When traveling through airports, show security personnel your identification card to avoid being screened in the metal detectors.  Ask the security personnel to use the hand wand. - Avoid arc welding equipment, MRI testing (magnetic resonance imaging), TENS units (transcutaneous nerve stimulators).  Call the office for questions about other devices. - Avoid electrical appliances that are in poor condition or are not properly grounded. - Microwave ovens are safe to be near or to operate.   Information on my medicine - ELIQUIS (apixaban)  This medication education was reviewed with me or my healthcare representative as part of my discharge preparation.  The pharmacist that spoke  with me during my hospital stay was:  Amedeo Plenty, [facility]  Why was Eliquis prescribed for you? Eliquis was prescribed for you to reduce the risk of forming blood clots that can cause a stroke if you have a medical condition called atrial fibrillation (a type of irregular heartbeat) OR to reduce the risk of a blood clots forming after orthopedic surgery.  What do You need to know about Eliquis ? Take your Eliquis TWICE DAILY - one tablet in the morning and one tablet in the evening with or without food.  It would be best to take the doses about the same time each day.  If you have difficulty swallowing the tablet whole please discuss with your pharmacist how to take the medication safely.  Take Eliquis exactly as prescribed by your doctor and DO NOT stop taking Eliquis without talking to the doctor who prescribed the medication.  Stopping may increase your risk of developing a new clot or stroke.  Refill your  prescription before you run out.  After discharge, you should have regular check-up appointments with your healthcare provider that is prescribing your Eliquis.  In the future your dose may need to be changed if your kidney function or weight changes by a significant amount or as you get older.  What do you do if you miss a dose? If you miss a dose, take it as soon as you remember on the same day and resume taking twice daily.  Do not take more than one dose of ELIQUIS at the same time.  Important Safety Information A possible side effect of Eliquis is bleeding. You should call your healthcare provider right away if you experience any of the following: ? Bleeding from an injury or your nose that does not stop. ? Unusual colored urine (red or dark brown) or unusual colored stools (red or black). ? Unusual bruising for unknown reasons. ? A serious fall or if you hit your head (even if there is no bleeding).  Some medicines may interact with Eliquis and might increase your risk of bleeding or clotting while on Eliquis. To help avoid this, consult your healthcare provider or pharmacist prior to using any new prescription or non-prescription medications, including herbals, vitamins, non-steroidal anti-inflammatory drugs (NSAIDs) and supplements.  This website has more information on Eliquis (apixaban): [email or web address].

## 2020-08-06 NOTE — Progress Notes (Signed)
ANTICOAGULATION CONSULT NOTE - Initial Consult  Pharmacy Consult for apixaban Indication: atrial fibrillation  Allergies  Allergen Reactions  . Sulfa Antibiotics Hives    Patient Measurements: Height: '5\' 6"'$  (167.6 cm) Weight: 79.9 kg (176 lb 3.2 oz) IBW/kg (Calculated) : 59.3  Vital Signs: Temp: 98 F (36.7 C) (04/09 1125) Temp Source: Oral (04/09 1125) BP: 102/53 (04/09 1125) Pulse Rate: 98 (04/09 1125)  Labs: Recent Labs    08/04/20 0213 08/05/20 0206 08/06/20 0117  HGB 8.7* 8.5* 8.9*  HCT 27.5* 27.1* 28.3*  PLT 404* 426* 423*  CREATININE 1.75* 1.94* 1.89*    Estimated Creatinine Clearance: 26.1 mL/min (A) (by C-G formula based on SCr of 1.89 mg/dL (H)).   Medical History: Past Medical History:  Diagnosis Date  . Depression   . Diabetes mellitus without complication ([place])   . Hypertension   . Night sweats     Medications:  Scheduled:  . acetaminophen (TYLENOL) oral liquid 160 mg/5 mL  1,000 mg Oral Q6H   Or  . acetaminophen  1,000 mg Oral Q6H  . aspirin  81 mg Oral Daily  . atorvastatin  80 mg Oral q1800  . bisacodyl  10 mg Oral Daily   Or  . bisacodyl  10 mg Rectal Daily  . clopidogrel  75 mg Oral Daily  . docusate sodium  200 mg Oral Daily  . enoxaparin (LOVENOX) injection  30 mg Subcutaneous QHS  . feeding supplement  237 mL Oral TID BM  . insulin aspart  0-24 Units Subcutaneous TID WC  . insulin aspart  5 Units Subcutaneous TID WC  . insulin detemir  22 Units Subcutaneous Daily  . mouth rinse  15 mL Mouth Rinse BID  . megestrol  40 mg Oral Daily  . melatonin  3 mg Oral QHS  . multivitamin with minerals  1 tablet Oral Daily  . pantoprazole sodium  40 mg Oral Daily   Or  . pantoprazole  40 mg Oral Daily  . polyethylene glycol  17 g Oral Daily  . potassium chloride  20 mEq Oral BID  . QUEtiapine  50 mg Oral QHS  . sodium chloride flush  3 mL Intravenous Q12H  . spironolactone  12.5 mg Oral Daily    Assessment: PR is a 79yoF starting  apixaban for atrial fibrillation. Of note pt is s/p CABG 3/24 on aspirin and clopidogrel. Discussed with MD, will d/c clopidogrel. Hgb low but recovering after surgery. No s/sx bleeding noted.   Goal of Therapy:  Monitor platelets by anticoagulation protocol: Yes  Plan:  Initiate apixaban '5mg'$  PO twice daily  D/c clopidogrel Follow CBC, monitor for s/sx bleeding   Mercy Riding, PharmD PGY1 Acute Care Pharmacy Resident Please refer to [facility] for unit-specific pharmacist

## 2020-08-06 NOTE — Progress Notes (Signed)
Progress Note  Patient Name: Kristin [name] Date of Encounter: 08/06/2020  [facility] Cardiologist: No primary care provider on file.   Subjective   - 3/24 NSTEMI - 3/24 Emergent CABG x 5 (LIMA to LAD, SEQ SVG to OM and Diagonal, SEQ to PL and PDA) - 3/25 take back to OR for tamponade. EF on TEE 55%, RV mildly down (3/25) -3/26 Extubated -3/28 Milrinone stopped. Midodrine started.  -3/30 S/P CRT-P.  -4/5 left thoracentesis, started on cefepime   Upset because she did not get her breakfast until 11am this morning. Otherwise, she feels a little better. Continue to be short of breath with ambulation.   Net negative 200cc Cr 1.89 from 1.94 Wt 176.2 today (stable from yesterday) Blood pressures stable in 110s/60s  Inpatient Medications    Scheduled Meds: . acetaminophen (TYLENOL) oral liquid 160 mg/5 mL  1,000 mg Oral Q6H   Or  . acetaminophen  1,000 mg Oral Q6H  . aspirin  81 mg Oral Daily  . atorvastatin  80 mg Oral q1800  . bisacodyl  10 mg Oral Daily   Or  . bisacodyl  10 mg Rectal Daily  . clopidogrel  75 mg Oral Daily  . docusate sodium  200 mg Oral Daily  . enoxaparin (LOVENOX) injection  30 mg Subcutaneous QHS  . feeding supplement  237 mL Oral TID BM  . insulin aspart  0-24 Units Subcutaneous TID WC  . insulin aspart  5 Units Subcutaneous TID WC  . insulin detemir  22 Units Subcutaneous Daily  . mouth rinse  15 mL Mouth Rinse BID  . megestrol  40 mg Oral Daily  . melatonin  3 mg Oral QHS  . multivitamin with minerals  1 tablet Oral Daily  . pantoprazole sodium  40 mg Oral Daily   Or  . pantoprazole  40 mg Oral Daily  . polyethylene glycol  17 g Oral Daily  . potassium chloride  20 mEq Oral BID  . QUEtiapine  50 mg Oral QHS  . sodium chloride flush  3 mL Intravenous Q12H  . spironolactone  12.5 mg Oral Daily   Continuous Infusions:  PRN Meds: acetaminophen, dextrose, guaiFENesin-dextromethorphan, lidocaine, magic mouthwash, metoprolol tartrate,  ondansetron (ZOFRAN) IV, oxyCODONE, traMADol   Vital Signs    Vitals:   08/05/20 2021 08/05/20 2322 08/06/20 0322 08/06/20 0745  BP: (!) 108/54 119/62 (!) 102/49 119/60  Pulse: 69 78 65 97  Resp: '18 20 16 18  '$ Temp: 98.3 F (36.8 C) 98.3 F (36.8 C) 98.1 F (36.7 C) 98.5 F (36.9 C)  TempSrc: Oral Oral Axillary Oral  SpO2:  97% 98% 96%  Weight:   79.9 kg   Height:        Intake/Output Summary (Last 24 hours) at 08/06/2020 1028 Last data filed at 08/05/2020 2343 Gross per 24 hour  Intake 840 ml  Output 1400 ml  Net -560 ml   Last 3 Weights 08/06/2020 08/05/2020 08/04/2020  Weight (lbs) 176 lb 3.2 oz 176 lb 3.2 oz 177 lb 4 oz  Weight (kg) 79.924 kg 79.924 kg 80.4 kg      Telemetry    V-paced; had brief run of Afib this morning - Personally Reviewed  ECG    No new tracing - Personally Reviewed  Physical Exam   GEN: Elderly female sitting up in a chair   Neck: No JVD Cardiac: RRR, no murmurs, rubs, or gallops.  Respiratory: Crackles at the bases with L>R  GI: Soft,  nontender, non-distended  MS: No edema; No deformity. Neuro:  Nonfocal  Psych: Normal affect   Labs    High Sensitivity Troponin:   Recent Labs  Lab 07/20/20 1538 07/20/20 1729  TROPONINIHS 3,198* 3,282*      Chemistry Recent Labs  Lab 08/04/20 0213 08/05/20 0206 08/06/20 0117  NA 140 139 139  K 4.0 3.8 3.8  CL 103 101 107  CO2 '27 28 22  '$ GLUCOSE 96 260* 208*  BUN 34* 34* 30*  CREATININE 1.75* 1.94* 1.89*  CALCIUM 8.1* 8.1* 8.4*  PROT  --   --  6.0*  ALBUMIN  --   --  2.3*  AST  --   --  32  ALT  --   --  32  ALKPHOS  --   --  85  BILITOT  --   --  1.0  GFRNONAA 29* 26* 27*  ANIONGAP '10 10 10     '$ Hematology Recent Labs  Lab 08/04/20 0213 08/05/20 0206 08/06/20 0117  WBC 8.5 7.1 7.7  RBC 2.73* 2.76* 2.78*  HGB 8.7* 8.5* 8.9*  HCT 27.5* 27.1* 28.3*  MCV 100.7* 98.2 101.8*  MCH 31.9 30.8 32.0  MCHC 31.6 31.4 31.4  RDW 18.5* 18.5* 18.3*  PLT 404* 426* 423*    BNPNo results  for input(s): BNP, PROBNP in the last 168 hours.   DDimer No results for input(s): DDIMER in the last 168 hours.   Radiology    No results found.  Cardiac Studies   TTE 08/01/20: IMPRESSIONS  1. Inferior basal septal distal anterior wall hypokinesis consistent with  multi vessel CAD. Left ventricular ejection fraction, by estimation, is 40  to 45%. The left ventricle has mildly decreased function. The left  ventricle demonstrates regional wall  motion abnormalities (see scoring diagram/findings for description). The  left ventricular internal cavity size was moderately dilated. Left  ventricular diastolic parameters are consistent with Grade I diastolic  dysfunction (impaired relaxation).  2. ? catheters in RA/RV . Right ventricular systolic function is normal.  The right ventricular size is normal. There is normal pulmonary artery  systolic pressure.  3. Left atrial size was mildly dilated.  4. Right atrial size was mildly dilated.  5. A small pericardial effusion is present. The pericardial effusion is  lateral to the left ventricle and posterior to the left ventricle.  Moderate pleural effusion in the left lateral region.  6. The mitral valve is abnormal. Mild mitral valve regurgitation. No  evidence of mitral stenosis.  7. Tricuspid valve regurgitation is moderate.  8. The aortic valve is normal in structure. Aortic valve regurgitation is  trivial. No aortic stenosis is present.  9. The inferior vena cava is dilated in size with >50% respiratory  variability, suggesting right atrial pressure of 8 mmHg.   Cath 07/21/20:  Mid LM lesion is 95% stenosed.  Ost Cx lesion is 99% stenosed.  Dist LM to Ost LAD lesion is 95% stenosed.  Mid LAD lesion is 95% stenosed.  Prox Cx lesion is 90% stenosed.  Prox RCA to Mid RCA lesion is 80% stenosed.  RPDA lesion is 80% stenosed.  RPAV lesion is 80% stenosed.  Ost LM lesion is 30% stenosed.    Patient Profile      80 y.o. female with history of DM, HTN, and obesity who presented with NSTEMI found to have critical LM and multivessel CAD prompting IABP placement and emergent CABG x5 on 07/21/20 with (LIMA to LAD, SEQ SVG to OM and  Diagonal, SEQ to PL and PDA). Post op course complicated by shock found to have tamponade and was taken back to the OR on 07/22/20. Successfully extubated post-procedure and required milrinone. Underwent CRT-P for CHB on 3/30. Now weaned off milrinone and transitioned to midodrine   Assessment & Plan    1. Ischemic Cardiomyopathy, Cardiogenic shock - S/p CABG 3/24 with take back for tamponade on 3/25 - LVEF 55% on post-op TEE 3/25-->40-45% on TTE 08/01/20 - Off milrinone. Midodrine stopped - Continue spironolactone 12.'5mg'$ , GDMT limited by soft blood pressures   - Off lasix due to rise in Cr; improved today-->will continue to hold today and possibly start PO tomorrow vs Monday  2. CAD s/p NSTEMI  - CABG x 5 3/24 as detailed above - No evidence of ongoing ischemia - Continue DAPT, statin - No BB/ACE/ARB due to soft blood pressures  3. Acute hypoxic respiratory failure post-op/ left pleural effusion  -S/p left sided thora on 4/5 -On cefepime for aspiration pneumonia -Continued to encourage IS use -Holding diuresis for now; likely PO tomorrow vs Monday  4. AKI - secondary to shock/ATN - baseline creatinine 1.1 -> peaked at 2>now 1.87  - holding diuresis as above with improvement in Cr overnight  5. Acute blood loss anemia, post -op - stable at 8.9  6. Hypernatremia -resolved  7. Thrombocytopenia - resolved  8. Dysphagia - Speech following now on dysphagia 3 diet   9. Deconditioning - c/w PT/OT - insurance denied CIR  - Plan d/c to SNF when medically ready   10 CHB - EP consulted.  - S/P CRT-P-- 07/27/20   11 PAF -continue monitoring telemetry, had an episode 4/7 and 4/9, may need ac at some point      For questions or updates, please contact  [place] Please consult [email or web address] for contact info under        Signed, Freada Bergeron, MD  08/06/2020, 10:28 AM

## 2020-08-06 NOTE — Plan of Care (Signed)
  Problem: Education: Goal: Knowledge of General Education information will improve Description: Including pain rating scale, medication(s)/side effects and non-pharmacologic comfort measures Outcome: Progressing   Problem: Health Behavior/Discharge Planning: Goal: Ability to manage health-related needs will improve Outcome: Progressing   Problem: Clinical Measurements: Goal: Ability to maintain clinical measurements within normal limits will improve Outcome: Progressing Goal: Will remain free from infection Outcome: Progressing Goal: Diagnostic test results will improve Outcome: Progressing Goal: Respiratory complications will improve Outcome: Progressing Goal: Cardiovascular complication will be avoided Outcome: Progressing   Problem: Activity: Goal: Risk for activity intolerance will decrease Outcome: Progressing   Problem: Coping: Goal: Level of anxiety will decrease Outcome: Progressing   Problem: Pain Managment: Goal: General experience of comfort will improve Outcome: Progressing   Problem: Safety: Goal: Ability to remain free from injury will improve Outcome: Progressing   Problem: Skin Integrity: Goal: Risk for impaired skin integrity will decrease Outcome: Progressing   Problem: Safety: Goal: Non-violent Restraint(s) Outcome: Progressing   Problem: Education: Goal: Knowledge of cardiac device and self-care will improve Outcome: Progressing Goal: Ability to safely manage health related needs after discharge will improve Outcome: Progressing   Problem: Cardiac: Goal: Ability to achieve and maintain adequate cardiopulmonary perfusion will improve Outcome: Progressing

## 2020-08-07 ENCOUNTER — Inpatient Hospital Stay (HOSPITAL_COMMUNITY): Payer: Medicare HMO

## 2020-08-07 DIAGNOSIS — Z951 Presence of aortocoronary bypass graft: Secondary | ICD-10-CM | POA: Diagnosis not present

## 2020-08-07 DIAGNOSIS — I214 Non-ST elevation (NSTEMI) myocardial infarction: Secondary | ICD-10-CM | POA: Diagnosis not present

## 2020-08-07 LAB — CBC
HCT: 28.9 % — ABNORMAL LOW (ref 36.0–46.0)
Hemoglobin: 8.8 g/dL — ABNORMAL LOW (ref 12.0–15.0)
MCH: 30.9 pg (ref 26.0–34.0)
MCHC: 30.4 g/dL (ref 30.0–36.0)
MCV: 101.4 fL — ABNORMAL HIGH (ref 80.0–100.0)
Platelets: 418 10*3/uL — ABNORMAL HIGH (ref 150–400)
RBC: 2.85 MIL/uL — ABNORMAL LOW (ref 3.87–5.11)
RDW: 18 % — ABNORMAL HIGH (ref 11.5–15.5)
WBC: 7.5 10*3/uL (ref 4.0–10.5)
nRBC: 0 % (ref 0.0–0.2)

## 2020-08-07 LAB — BASIC METABOLIC PANEL
Anion gap: 7 (ref 5–15)
BUN: 27 mg/dL — ABNORMAL HIGH (ref 8–23)
CO2: 22 mmol/L (ref 22–32)
Calcium: 8.3 mg/dL — ABNORMAL LOW (ref 8.9–10.3)
Chloride: 109 mmol/L (ref 98–111)
Creatinine, Ser: 1.75 mg/dL — ABNORMAL HIGH (ref 0.44–1.00)
GFR, Estimated: 29 mL/min — ABNORMAL LOW (ref >60)
Glucose, Bld: 233 mg/dL — ABNORMAL HIGH (ref 70–99)
Potassium: 4.3 mmol/L (ref 3.5–5.1)
Sodium: 138 mmol/L (ref 135–145)

## 2020-08-07 LAB — GLUCOSE, CAPILLARY
Glucose-Capillary: 220 mg/dL — ABNORMAL HIGH (ref 70–99)
Glucose-Capillary: 132 mg/dL — ABNORMAL HIGH (ref 70–99)
Glucose-Capillary: 182 mg/dL — ABNORMAL HIGH (ref 70–99)
Glucose-Capillary: 70 mg/dL (ref 70–99)

## 2020-08-07 LAB — CULTURE, BLOOD (ROUTINE X 2)
Culture: NO GROWTH
Culture: NO GROWTH
Special Requests: ADEQUATE
Special Requests: ADEQUATE

## 2020-08-07 LAB — MAGNESIUM: Magnesium: 2.3 mg/dL (ref 1.7–2.4)

## 2020-08-07 MED ORDER — ALPRAZOLAM 0.25 MG PO TABS
0.2500 mg | ORAL_TABLET | Freq: Once | ORAL | Status: AC
  Administered 2020-08-07: 0.25 mg via ORAL
  Filled 2020-08-07: qty 1

## 2020-08-07 MED ORDER — INSULIN DETEMIR 100 UNIT/ML ~~LOC~~ SOLN
24.0000 [IU] | Freq: Every day | SUBCUTANEOUS | Status: DC
  Administered 2020-08-07 – 2020-08-10 (×4): 24 [IU] via SUBCUTANEOUS
  Filled 2020-08-07 (×4): qty 0.24

## 2020-08-07 NOTE — Progress Notes (Signed)
Mobility Specialist - Progress Note   08/07/20 1059  Mobility  Activity Ambulated in hall  Level of Assistance Moderate assist, patient does 50-74%  Assistive Device Four wheel walker  Distance Ambulated (ft) 120 ft  Mobility Response Tolerated well  Mobility performed by Mobility specialist  $Mobility charge 1 Mobility   Pre-mobility: 90 HR During mobility: 111 HR Post-mobility: 100 HR  Pt mod assist to stand from bed, still requiring frequent cues for sternal precautions. She reports this was her 2nd walk today. Pt back in bed after walk, asx throughout.  Pricilla Handler Mobility Specialist Mobility Specialist Phone: [phone]

## 2020-08-07 NOTE — Progress Notes (Addendum)
      [address]       [place] [address]             [phone]        11 Days Post-Op Procedure(s) (LRB): BIV PACEMAKER INSERTION CRT-P (N/A)  Subjective: Patient states she gets fairly short of breath with exertion. She is still not sleeping well despite Melatonin.  Objective: Vital signs in last 24 hours: Temp:  [98 F (36.7 C)-98.6 F (37 C)] 98 F (36.7 C) (04/10 0336) Pulse Rate:  [90-99] 99 (04/10 0336) Cardiac Rhythm: Ventricular paced;Other (Comment) (04/09 2038) Resp:  [18-20] 19 (04/10 0336) BP: (102-124)/(49-73) 124/73 (04/10 0336) SpO2:  [93 %-100 %] 96 % (04/10 0336) Weight:  [78.6 kg] 78.6 kg (04/10 0336)  Pre op weight 78.1 kg Current Weight  08/07/20 78.6 kg       Intake/Output from previous day: 04/09 0701 - 04/10 0700 In: 220 [P.O.:220] Out: 800 [Urine:800]   Physical Exam:  Cardiovascular: V paced Pulmonary: Diminished left basilar breath sounds Abdomen: Soft, non tender, bowel sounds present. Extremities: Trace bilateral lower extremity edema. Wounds: Sternal and bilateral LE wounds are clean and dry.  No erythema or signs of infection.  Lab Results: CBC: Recent Labs    08/06/20 0117 08/07/20 0058  WBC 7.7 7.5  HGB 8.9* 8.8*  HCT 28.3* 28.9*  PLT 423* 418*   BMET:  Recent Labs    08/06/20 0117 08/07/20 0058  NA 139 138  K 3.8 4.3  CL 107 109  CO2 22 22  GLUCOSE 208* 233*  BUN 30* 27*  CREATININE 1.89* 1.75*  CALCIUM 8.4* 8.3*    PT/INR:  Lab Results  Component Value Date   INR 1.6 (H) 07/21/2020   ABG:  INR: Will add last result for INR, ABG once components are confirmed Will add last 4 CBG results once components are confirmed  Assessment/Plan:  1. CV - S/p NSTEMI. Previous CHB-s/p CRT-P. Previous PAF. Vent paced. On  DAPT. No BB as has had previous cardiogenic shock and somewhat labile BP 2.  Pulmonary - On room air. CXR this am appears to show increased left pleural  effusion/atelectasis. As discussed with Dr. Roxy Manns, will order PA/LAT CXR and determine in am if needs another left thoracentesis.  Of note, she had a left thoracentesis on 04/05. Encourage incentive spirometer. 3. Volume Overload - On Spironolactone 12.5 mg daily 4.  Expected post op acute blood loss anemia - H and H this am stable at 8.8 and 28.9 5. DM-CBGs 120/189/220. On Insulin. Pre op HGA1C 8.8. She was also on Actos and Glimepiride but will not restart secondary to elevated creatinine. Will need close medical follow up after discharge 6. AKI-creatinine this am decreased to 1.75 7. Dysphagia-on Dysphagia III diet (per speech pathology) 8. Deconditioned-continue PT/OT. Will need SNF when ready for discharge;hopefully, Monday.  Donielle M ZimmermanPA-C 08/07/2020,7:35 AM   I have seen and examined the patient and agree with the assessment and plan as outlined.  Will check upright PA + Lat CXR in am.  Hopefully d/c to SNF tomorrow  Rexene Alberts, MD 08/07/2020 11:07 AM

## 2020-08-08 ENCOUNTER — Inpatient Hospital Stay (HOSPITAL_COMMUNITY): Payer: Medicare HMO

## 2020-08-08 DIAGNOSIS — Z951 Presence of aortocoronary bypass graft: Secondary | ICD-10-CM | POA: Diagnosis not present

## 2020-08-08 DIAGNOSIS — I214 Non-ST elevation (NSTEMI) myocardial infarction: Secondary | ICD-10-CM | POA: Diagnosis not present

## 2020-08-08 DIAGNOSIS — I5021 Acute systolic (congestive) heart failure: Secondary | ICD-10-CM | POA: Diagnosis not present

## 2020-08-08 HISTORY — PX: IR THORACENTESIS ASP PLEURAL SPACE W/IMG GUIDE: IMG5380

## 2020-08-08 LAB — BASIC METABOLIC PANEL
Anion gap: 11 (ref 5–15)
BUN: 25 mg/dL — ABNORMAL HIGH (ref 8–23)
CO2: 19 mmol/L — ABNORMAL LOW (ref 22–32)
Calcium: 8.6 mg/dL — ABNORMAL LOW (ref 8.9–10.3)
Chloride: 110 mmol/L (ref 98–111)
Creatinine, Ser: 1.61 mg/dL — ABNORMAL HIGH (ref 0.44–1.00)
GFR, Estimated: 33 mL/min — ABNORMAL LOW (ref >60)
Glucose, Bld: 178 mg/dL — ABNORMAL HIGH (ref 70–99)
Potassium: 4.8 mmol/L (ref 3.5–5.1)
Sodium: 140 mmol/L (ref 135–145)

## 2020-08-08 LAB — GLUCOSE, CAPILLARY
Glucose-Capillary: 195 mg/dL — ABNORMAL HIGH (ref 70–99)
Glucose-Capillary: 330 mg/dL — ABNORMAL HIGH (ref 70–99)
Glucose-Capillary: 147 mg/dL — ABNORMAL HIGH (ref 70–99)
Glucose-Capillary: 175 mg/dL — ABNORMAL HIGH (ref 70–99)

## 2020-08-08 LAB — MAGNESIUM: Magnesium: 2.4 mg/dL (ref 1.7–2.4)

## 2020-08-08 MED ORDER — LIDOCAINE HCL 1 % IJ SOLN
INTRAMUSCULAR | Status: AC
  Filled 2020-08-08: qty 20

## 2020-08-08 MED ORDER — POTASSIUM CHLORIDE CRYS ER 20 MEQ PO TBCR
20.0000 meq | EXTENDED_RELEASE_TABLET | Freq: Every day | ORAL | Status: DC
  Administered 2020-08-08: 20 meq via ORAL
  Filled 2020-08-08: qty 1

## 2020-08-08 MED ORDER — TORSEMIDE 20 MG PO TABS
20.0000 mg | ORAL_TABLET | Freq: Every day | ORAL | Status: DC
  Administered 2020-08-08 – 2020-08-10 (×3): 20 mg via ORAL
  Filled 2020-08-08 (×3): qty 1

## 2020-08-08 MED ORDER — TORSEMIDE 20 MG PO TABS: 40.0000 mg | ORAL_TABLET | Freq: Every day | ORAL | Status: DC

## 2020-08-08 NOTE — Progress Notes (Addendum)
[address]       [place] [address]             [phone]      12 Days Post-Op Procedure(s) (LRB): BIV PACEMAKER INSERTION CRT-P (N/A) Subjective:  feels ok, some SOB, getting stronger slowly with PT/ambulation  Objective: Vital signs in last 24 hours: Temp:  [97.9 F (36.6 C)-98.3 F (36.8 C)] 98 F (36.7 C) (04/11 0440) Pulse Rate:  [70-107] 100 (04/11 0440) Cardiac Rhythm: Ventricular paced (04/10 1900) Resp:  [17-19] 18 (04/11 0440) BP: (105-132)/(52-82) 119/82 (04/11 0440) SpO2:  [96 %-98 %] 98 % (04/11 0440) Weight:  [78.6 kg] 78.6 kg (04/11 0320)  Hemodynamic parameters for last 24 hours:    Intake/Output from previous day: 04/10 0701 - 04/11 0700 In: 120 [P.O.:120] Out: 400 [Urine:400] Intake/Output this shift: No intake/output data recorded.  General appearance: alert, cooperative, fatigued and no distress Heart: regular rate and rhythm Lungs: dim L>R base Abdomen: benign Extremities: min left ankle edema Wound: incis healing well  Lab Results: Recent Labs    08/06/20 0117 08/07/20 0058  WBC 7.7 7.5  HGB 8.9* 8.8*  HCT 28.3* 28.9*  PLT 423* 418*   BMET:  Recent Labs    08/07/20 0058 08/08/20 0126  NA 138 140  K 4.3 4.8  CL 109 110  CO2 22 19*  GLUCOSE 233* 178*  BUN 27* 25*  CREATININE 1.75* 1.61*  CALCIUM 8.3* 8.6*    PT/INR: No results for input(s): LABPROT, INR in the last 72 hours. ABG    Component Value Date/Time   PHART 7.442 07/23/2020 0434   HCO3 29.2 (H) 07/23/2020 0434   TCO2 31 07/23/2020 0434   ACIDBASEDEF 1.0 07/22/2020 0814   O2SAT 59.9 07/26/2020 0357   CBG (last 3)  Recent Labs    08/07/20 1555 08/07/20 2102 08/08/20 0628  GLUCAP 182* 70 195*    Meds Scheduled Meds: . acetaminophen (TYLENOL) oral liquid 160 mg/5 mL  1,000 mg Oral Q6H   Or  . acetaminophen  1,000 mg Oral Q6H  . apixaban  5 mg Oral BID  . aspirin  81 mg Oral Daily  . atorvastatin  80 mg Oral q1800  .  bisacodyl  10 mg Oral Daily   Or  . bisacodyl  10 mg Rectal Daily  . docusate sodium  200 mg Oral Daily  . feeding supplement  237 mL Oral TID BM  . insulin aspart  0-24 Units Subcutaneous TID WC  . insulin aspart  5 Units Subcutaneous TID WC  . insulin detemir  24 Units Subcutaneous Daily  . mouth rinse  15 mL Mouth Rinse BID  . megestrol  40 mg Oral Daily  . melatonin  3 mg Oral QHS  . multivitamin with minerals  1 tablet Oral Daily  . pantoprazole sodium  40 mg Oral Daily   Or  . pantoprazole  40 mg Oral Daily  . polyethylene glycol  17 g Oral Daily  . potassium chloride  20 mEq Oral BID  . QUEtiapine  50 mg Oral QHS  . sodium chloride flush  3 mL Intravenous Q12H  . spironolactone  12.5 mg Oral Daily   Continuous Infusions: PRN Meds:.acetaminophen, dextrose, guaiFENesin-dextromethorphan, lidocaine, magic mouthwash, metoprolol tartrate, ondansetron (ZOFRAN) IV, oxyCODONE, traMADol  Xrays DG CHEST PORT 1 VIEW  Result Date: 08/07/2020 CLINICAL DATA:  Pleural effusion EXAM: PORTABLE CHEST 1 VIEW COMPARISON:  August 02, 2020 FINDINGS: There is a partially  loculated pleural effusion on the left which appears slightly larger. There is a small right pleural effusion which appears partially loculated, unchanged. There is atelectatic change and questionable infiltrate left base. Stable cardiac prominence with pulmonary vascularity normal. Pacemaker leads attached to right atrium, right ventricle, and coronary sinus. Patient is status post median sternotomy. There is aortic atherosclerosis. No adenopathy. No bone lesions. IMPRESSION: Partially loculated pleural effusion on the left appears slightly larger than on most recent study. Small partially loculated pleural effusion right base appear stable. Atelectatic change with questionable superimposed pneumonia left base. Stable cardiac prominence with pacemaker leads unchanged. Aortic Atherosclerosis (ICD10-I70.0). Electronically Signed   By: Lowella Grip III M.D.   On: 08/07/2020 08:04    Assessment/Plan: S/P Procedure(s) (LRB): BIV PACEMAKER INSERTION CRT-P (N/A)   1 afeb, VSS, sinus rhythm , paced at times- placed for CHB- has  CRT-P, on apixaban for prev/recent afib 2 sats good on RA 3 creat trending improved 4 BS  Variable but adequate control on current insulin dose 5 CXR small/mod L>R effus 6 cardiology conts to assist with maximization of therapies , likely restart diuretics po in next few days 7 slowly improving with physical rehab 8 SNF at d/c when medically stable- bed avail at [facility] by tuesday  LOS: 19 days    John Giovanni 08/08/2020    I have seen and examined the patient and agree with the assessment and plan as outlined.  Some re accumulation of left effusion - might benefit from repeat thoracentesis prior to hospital d/c.   Will discuss w/ IR whether or not Eliquis needs to be held.  Rexene Alberts, MD 08/08/2020 9:30 AM

## 2020-08-08 NOTE — Progress Notes (Signed)
CARDIAC REHAB PHASE I   PRE:  Rate/Rhythm: 103 paced  BP:  Sitting: 128/57      SaO2: 100 RA  MODE:  Ambulation: 111 ft x2   POST:  Rate/Rhythm: 117 paced  BP:  Sitting: 138/98    SaO2: 96 RA  Pt agreeable to ambulate this am. Pt ambulated 150f in hallway assist of one with rollator. Pt took a standing rest break c/o SOB. Coached throughout purse lipped breathing, and able to ambulate back to room. Pt returned to recliner. Encouraged continued IS use and walks. Hopeful for rehab tomorrow. Will continue to follow.  [id number] Falco RN BSN 08/08/2020 9:30 AM

## 2020-08-08 NOTE — Progress Notes (Signed)
Progress Note  Patient Name: Kristin [name] Date of Encounter: 08/08/2020  [facility] Cardiologist: No primary care provider on file.   Subjective   - 3/24 NSTEMI - 3/24 Emergent CABG x 5 (LIMA to LAD, SEQ SVG to OM and Diagonal, SEQ to PL and PDA) - 3/25 take back to OR for tamponade. EF on TEE 55%, RV mildly down (3/25) -3/26 Extubated -3/28 Milrinone stopped. Midodrine started.  -3/30 S/P CRT-P.  -4/5 left thoracentesis, started on cefepime -4/8 had several episodes of Afib; started on apixaban  Continues with mild SOB. CXR with mod L and small R effusion.   No further AF. Weight back to baseline    Cr continues to improve 1.89-->1.75 -> 1.6 off diuretics  Inpatient Medications    Scheduled Meds: . acetaminophen (TYLENOL) oral liquid 160 mg/5 mL  1,000 mg Oral Q6H   Or  . acetaminophen  1,000 mg Oral Q6H  . apixaban  5 mg Oral BID  . aspirin  81 mg Oral Daily  . atorvastatin  80 mg Oral q1800  . bisacodyl  10 mg Oral Daily   Or  . bisacodyl  10 mg Rectal Daily  . docusate sodium  200 mg Oral Daily  . feeding supplement  237 mL Oral TID BM  . insulin aspart  0-24 Units Subcutaneous TID WC  . insulin aspart  5 Units Subcutaneous TID WC  . insulin detemir  24 Units Subcutaneous Daily  . mouth rinse  15 mL Mouth Rinse BID  . megestrol  40 mg Oral Daily  . melatonin  3 mg Oral QHS  . multivitamin with minerals  1 tablet Oral Daily  . pantoprazole sodium  40 mg Oral Daily   Or  . pantoprazole  40 mg Oral Daily  . polyethylene glycol  17 g Oral Daily  . potassium chloride  20 mEq Oral BID  . QUEtiapine  50 mg Oral QHS  . sodium chloride flush  3 mL Intravenous Q12H  . spironolactone  12.5 mg Oral Daily   Continuous Infusions:  PRN Meds: acetaminophen, dextrose, guaiFENesin-dextromethorphan, lidocaine, magic mouthwash, metoprolol tartrate, ondansetron (ZOFRAN) IV, oxyCODONE, traMADol   Vital Signs    Vitals:   08/07/20 1920 08/07/20 2335 08/08/20  0320 08/08/20 0440  BP: (!) 126/52 132/60  119/82  Pulse: 100 70  100  Resp: '18 18  18  '$ Temp: 98.3 F (36.8 C) 98 F (36.7 C)  98 F (36.7 C)  TempSrc: Oral Oral  Oral  SpO2: 96% 98%  98%  Weight:   78.6 kg   Height:        Intake/Output Summary (Last 24 hours) at 08/08/2020 0856 Last data filed at 08/07/2020 2000 Gross per 24 hour  Intake 120 ml  Output --  Net 120 ml   Last 3 Weights 08/08/2020 08/07/2020 08/06/2020  Weight (lbs) 173 lb 4.5 oz 173 lb 4.5 oz 176 lb 3.2 oz  Weight (kg) 78.6 kg 78.6 kg 79.924 kg      Telemetry    V-paced; no Afib this AM - Personally Reviewed  ECG    No new tracing- Personally Reviewed  Physical Exam   General:  Sitting up in bed . No resp difficulty HEENT: normal Neck: supple. no JVD. Carotids 2+ bilat; no bruits. No lymphadenopathy or thryomegaly appreciated. Cor: PMI nondisplaced. Regular rate & rhythm. No rubs, gallops or murmurs. Lungs: decreased at bases Abdomen: soft, nontender, nondistended. No hepatosplenomegaly. No bruits or masses. Good bowel sounds.  Extremities: no cyanosis, clubbing, rash, edema Neuro: alert & orientedx3, cranial nerves grossly intact. moves all 4 extremities w/o difficulty. Affect pleasant   Labs    High Sensitivity Troponin:   Recent Labs  Lab 07/20/20 1538 07/20/20 1729  TROPONINIHS 3,198* 3,282*      Chemistry Recent Labs  Lab 08/06/20 0117 08/07/20 0058 08/08/20 0126  NA 139 138 140  K 3.8 4.3 4.8  CL 107 109 110  CO2 22 22 19*  GLUCOSE 208* 233* 178*  BUN 30* 27* 25*  CREATININE 1.89* 1.75* 1.61*  CALCIUM 8.4* 8.3* 8.6*  PROT 6.0*  --   --   ALBUMIN 2.3*  --   --   AST 32  --   --   ALT 32  --   --   ALKPHOS 85  --   --   BILITOT 1.0  --   --   GFRNONAA 27* 29* 33*  ANIONGAP '10 7 11     '$ Hematology Recent Labs  Lab 08/05/20 0206 08/06/20 0117 08/07/20 0058  WBC 7.1 7.7 7.5  RBC 2.76* 2.78* 2.85*  HGB 8.5* 8.9* 8.8*  HCT 27.1* 28.3* 28.9*  MCV 98.2 101.8* 101.4*  MCH  30.8 32.0 30.9  MCHC 31.4 31.4 30.4  RDW 18.5* 18.3* 18.0*  PLT 426* 423* 418*    BNPNo results for input(s): BNP, PROBNP in the last 168 hours.   DDimer No results for input(s): DDIMER in the last 168 hours.   Radiology    DG Chest 2 View  Result Date: 08/08/2020 CLINICAL DATA:  Left-sided pleural effusion EXAM: CHEST - 2 VIEW COMPARISON:  08/07/2020 FINDINGS: Cardiac shadow is enlarged but stable. Postsurgical change and pacing device are again noted and stable. Stable loculated appearing left pleural effusion is noted. Small right effusion is seen as well. No new focal effusion or infiltrate is noted. No pneumothorax is seen. IMPRESSION: Loculated left-sided pleural effusion. Small right-sided effusion is noted as well. Electronically Signed   By: Inez Catalina M.D.   On: 08/08/2020 08:12   DG CHEST PORT 1 VIEW  Result Date: 08/07/2020 CLINICAL DATA:  Pleural effusion EXAM: PORTABLE CHEST 1 VIEW COMPARISON:  August 02, 2020 FINDINGS: There is a partially loculated pleural effusion on the left which appears slightly larger. There is a small right pleural effusion which appears partially loculated, unchanged. There is atelectatic change and questionable infiltrate left base. Stable cardiac prominence with pulmonary vascularity normal. Pacemaker leads attached to right atrium, right ventricle, and coronary sinus. Patient is status post median sternotomy. There is aortic atherosclerosis. No adenopathy. No bone lesions. IMPRESSION: Partially loculated pleural effusion on the left appears slightly larger than on most recent study. Small partially loculated pleural effusion right base appear stable. Atelectatic change with questionable superimposed pneumonia left base. Stable cardiac prominence with pacemaker leads unchanged. Aortic Atherosclerosis (ICD10-I70.0). Electronically Signed   By: Lowella Grip III M.D.   On: 08/07/2020 08:04    Cardiac Studies   TTE 08/01/20: IMPRESSIONS  1. Inferior  basal septal distal anterior wall hypokinesis consistent with  multi vessel CAD. Left ventricular ejection fraction, by estimation, is 40  to 45%. The left ventricle has mildly decreased function. The left  ventricle demonstrates regional wall  motion abnormalities (see scoring diagram/findings for description). The  left ventricular internal cavity size was moderately dilated. Left  ventricular diastolic parameters are consistent with Grade I diastolic  dysfunction (impaired relaxation).  2. ? catheters in RA/RV . Right ventricular systolic function is  normal.  The right ventricular size is normal. There is normal pulmonary artery  systolic pressure.  3. Left atrial size was mildly dilated.  4. Right atrial size was mildly dilated.  5. A small pericardial effusion is present. The pericardial effusion is  lateral to the left ventricle and posterior to the left ventricle.  Moderate pleural effusion in the left lateral region.  6. The mitral valve is abnormal. Mild mitral valve regurgitation. No  evidence of mitral stenosis.  7. Tricuspid valve regurgitation is moderate.  8. The aortic valve is normal in structure. Aortic valve regurgitation is  trivial. No aortic stenosis is present.  9. The inferior vena cava is dilated in size with >50% respiratory  variability, suggesting right atrial pressure of 8 mmHg.   Cath 07/21/20:  Mid LM lesion is 95% stenosed.  Ost Cx lesion is 99% stenosed.  Dist LM to Ost LAD lesion is 95% stenosed.  Mid LAD lesion is 95% stenosed.  Prox Cx lesion is 90% stenosed.  Prox RCA to Mid RCA lesion is 80% stenosed.  RPDA lesion is 80% stenosed.  RPAV lesion is 80% stenosed.  Ost LM lesion is 30% stenosed.   Patient Profile     80 y.o. female with history of DM, HTN, and obesity who presented with NSTEMI found to have critical LM and multivessel CAD prompting IABP placement and emergent CABG x5 on 07/21/20 with (LIMA to LAD, SEQ SVG to OM  and Diagonal, SEQ to PL and PDA). Post op course complicated by shock found to have tamponade and was taken back to the OR on 07/22/20. Successfully extubated post-procedure and required milrinone. Underwent CRT-P for CHB on 3/30. Now weaned off milrinone and midodrine.   Assessment & Plan    1. Ischemic Cardiomyopathy, Cardiogenic shock - S/p CABG 3/24 with take back for tamponade on 3/25 - LVEF 55% on post-op TEE 3/25-->40-45% on TTE 08/01/20 - Off milrinone. Midodrine stopped and blood pressures remain stable - Continue spironolactone 12.'5mg'$ , GDMT limited by soft blood pressures - Off lasix due to rise in Cr. Now improved. Weight back to baseline.  - Will start torsemide 40 daily and watch closely.   2. CAD s/p NSTEMI  - CABG x 5 3/24 as detailed above - No s/s ischemia - Continue ASA and apixaban; plavix stopped due to need for eliquis - Continue statin - No BB/ACE/ARB due to soft blood pressures  3. Acute hypoxic respiratory failure post-op/ left pleural effusion  -S/p left sided thora on 4/5;  - CXR this am. With moderate recurrent left effusion and small right. TCTS following. No plans for Pleurex - Completed cefepime for aspiration pneumonia -Continued to encourage IS use -Restart torsemide  4. AKI - improving - secondary to shock/ATN - baseline creatinine 1.1 -> peaked at 2>now 1.87 -> 1.61  5. Acute blood loss anemia, post -op - stable at 8.8 yesterday - continue to trend  6. Hypernatremia -resolved  7. Thrombocytopenia - resolved  8. Dysphagia - Speech following now on dysphagia 3 diet   9. Deconditioning - c/w PT/OT - insurance denied CIR  - Plan d/c to SNF. Likely tomorrow  10. CHB - S/P CRT-P-- 07/27/20  11. PAF - Had several recurrent episodes of Afib on the monitor on 4/7 and 4/9 - Started on apixaban '5mg'$  BID for [facility] - [facility]       For questions or updates, please contact [place] Please consult [email or web address] for contact  info under  Signed, [name], MD  08/08/2020, 8:56 AM

## 2020-08-08 NOTE — Progress Notes (Signed)
Physical Therapy Treatment Patient Details Name: Kristin [name] MRN: [id number] DOB: 01-06-1941 Today's Date: 08/08/2020    History of Present Illness Kristin [name] is a 80 y/o female admitted 3/23 from [place] with emergent cath then to ICU on 07/21/20. Pt with NSTEMI and underwent CABGx5 on 07/22/20. Post-op tamponade requiring return to OR on 3/25 for hematoma evacuation and then extubated on 3/26. Underwent BIV pacemaker insertion 3/30. Left thoracentesis on 4/5. episodes of Afib on 4/8. PMH includes DM,HTN, HLD, and history of gallbladder surgery.    PT Comments    Pt tolerates treatment well, ambulating for increased distances with PT, however demonstrating increased work of breathing. Pt does continue to require cues to maintain sternal precautions and physical assistance to stand due to LE weakness. Pt will benefit from aggressive mobilization and PT POC to improve LE strength and endurance in an effort to restore her prior level of function. PT continues to recommend SNF placement.   Follow Up Recommendations  Supervision for mobility/OOB;SNF     Equipment Recommendations  Rolling walker with 5" wheels;3in1 (PT)    Recommendations for Other Services       Precautions / Restrictions Precautions Precautions: Sternal;ICD/Pacemaker Precaution Comments: pt reports knowledge of precautions however requires cues to maintain during bed mobility Restrictions Weight Bearing Restrictions: No Other Position/Activity Restrictions: sternal and pacemaker precautions    Mobility  Bed Mobility Overal bed mobility: Needs Assistance Bed Mobility: Rolling;Sidelying to Sit;Sit to Supine Rolling: Min guard Sidelying to sit: Min assist;HOB elevated Supine to sit: Min assist     General bed mobility comments: pt requries cues to avoid pushing/pulling through UE    Transfers Overall transfer level: Needs assistance Equipment used: 4-wheeled walker Transfers: Sit to/from Stand Sit to Stand: Min  assist         General transfer comment: cues for anterior lean and rocking for momentum, PT physical assist to power up  Ambulation/Gait Ambulation/Gait assistance: Min guard Gait Distance (Feet): 100 Feet Assistive device: 4-wheeled walker Gait Pattern/deviations: Step-through pattern Gait velocity: reduced Gait velocity interpretation: <1.8 ft/sec, indicate of risk for recurrent falls General Gait Details: pt with slowed step through gait, one standing rest break mid-walk due to fatigue   Stairs             Wheelchair Mobility    Modified Rankin (Stroke Patients Only)       Balance Overall balance assessment: Needs assistance Sitting-balance support: No upper extremity supported;Feet supported Sitting balance-Leahy Scale: Fair     Standing balance support: Single extremity supported;Bilateral upper extremity supported Standing balance-Leahy Scale: Poor Standing balance comment: reliant on external assist                            Cognition Arousal/Alertness: Awake/alert Behavior During Therapy: WFL for tasks assessed/performed;Anxious;Flat affect Overall Cognitive Status: Impaired/Different from baseline Area of Impairment: Memory                     Memory: Decreased recall of precautions                Exercises      General Comments General comments (skin integrity, edema, etc.): pt on RA, desats to mid 80s with ambulation however poor pleth reading. PT does not increased work of breathing. When resting and not gripping 4 wheeled walker pt sats in low 90s after ambulation      Pertinent Vitals/Pain Pain Assessment: No/denies pain  Home Living                      Prior Function            PT Goals (current goals can now be found in the care plan section) Acute Rehab PT Goals Patient Stated Goal: feel better and be able to move like I want to. PT Goal Formulation: With patient Time For Goal Achievement:  08/22/20 Potential to Achieve Goals: Good Progress towards PT goals: Progressing toward goals    Frequency    Min 2X/week      PT Plan Current plan remains appropriate    Co-evaluation              AM-PAC PT "6 Clicks" Mobility   Outcome Measure  Help needed turning from your back to your side while in a flat bed without using bedrails?: A Little Help needed moving from lying on your back to sitting on the side of a flat bed without using bedrails?: A Little Help needed moving to and from a bed to a chair (including a wheelchair)?: A Little Help needed standing up from a chair using your arms (e.g., wheelchair or bedside chair)?: A Little Help needed to walk in hospital room?: A Little Help needed climbing 3-5 steps with a railing? : A Lot 6 Click Score: 17    End of Session   Activity Tolerance: Patient tolerated treatment well Patient left: in bed;with call bell/phone within reach;with bed alarm set Nurse Communication: Mobility status PT Visit Diagnosis: Unsteadiness on feet (R26.81);Muscle weakness (generalized) (M62.81);Difficulty in walking, not elsewhere classified (R26.2)     Time: [id number] PT Time Calculation (min) (ACUTE ONLY): 19 min  Charges:  $Therapeutic Activity: 8-22 mins                     Zenaida Niece, PT, DPT Acute Rehabilitation Pager: [phone]    Zenaida Niece 08/08/2020, 1:01 PM

## 2020-08-08 NOTE — TOC Progression Note (Addendum)
Transition of Care (TOC) - Progression Note  Heart Failure   Patient Details  Name: GURKIRAN NEWITT MRN: [id number] Date of Birth: 11-12-1940  Transition of Care [facility]) CM/SW [place], [place] Phone Number: 08/08/2020, 12:43 PM  Clinical Narrative:    CSW outreached [facility] in [place] [phone]) to check on bed availability however nobody answered the phone and CSW left a voicemail for them to return the call. CSW will continue to make outreach attempts with [facility] in [place] as no other SNF [place] options available due to patients health insurance not being in network.  CSW outreached [facility] in [place] again at 1:47pm and spoke with Tammy in admissions ([phone]) who reported that she thinks they were holding Ms. Redic a bed in room #701 and that she will start the insurance authorization once CSW sends over the most recent therapy notes. CSW sent over in the hub the most recent therapy notes to Tammy to start the insurance authorization. CSW will keep Tammy updated about patients discharge.   TOC will continue to follow for d/c needs.    Expected Discharge Plan: [place] Barriers to Discharge: Continued Medical Work up  Expected Discharge Plan and Services Expected Discharge Plan: [place] In-house Referral: Clinical Social Work Discharge Planning Services: CM Consult Post Acute Care Choice: [place] arrangements for the past 2 months: Single Family Home                                       Social Determinants of Health (SDOH) Interventions    Readmission Risk Interventions No flowsheet data found.  Dorrance Sellick, MSW, [place] Heart Failure Social Worker

## 2020-08-08 NOTE — Progress Notes (Signed)
Nutrition Follow-up  DOCUMENTATION CODES:   Not applicable  INTERVENTION:    Ensure Enlive po TID, each supplement provides 350 kcal and 20 grams of protein  MVI daily   NUTRITION DIAGNOSIS:   Inadequate oral intake related to acute illness,inability to eat,decreased appetite (Cardiogenic shock and CAD) as evidenced by meal completion < 50%,NPO status.  Ongoing  GOAL:   Patient will meet greater than or equal to 90% of their needs   Progressing   MONITOR:   PO intake,Supplement acceptance,Diet advancement  REASON FOR ASSESSMENT:   Ventilator    ASSESSMENT:  80 yo female admitted with NSTEMI, required emergent CABG x 5 on 3/24, cardiogenic shock. PMH includes DM, obesity, HTN, HL   3/24 - s/p CABG x 5 3/25 - s/p OR for Mediastinal exploration 3/26 - extubated 3/28 - SLP recs Dys 2 with thins 4/05 - s/p L thoracentesis 4/08 - SLP recs Dys 3 with thins  CXR showed moderate recurrent L effusion and small R.   Patient reports appetite has progressed over the last week. Last five meal completions charted as 90%, 40%, 40%, 60%, and 50%. Patient taking two Ensures daily. Plan d/c to SNF tomorrow.   Admission weight: 78.1 kg  Current weight: 78.6 kg   Medications: dulcolax, colace, SS novolog, levemir, miralax, 20 mEq KCl daily, aldactone, demadex Labs: CBG 70-330  Diet Order:   Diet Order            DIET DYS 3 Room service appropriate? Yes with Assist; Fluid consistency: Thin; Fluid restriction: 1500 mL Fluid  Diet effective now                EDUCATION NEEDS:  Education needs have been addressed  Skin:  Skin Assessment: Skin Integrity Issues: Skin Integrity Issues:: Incisions Incisions: chest, leg  Last BM:  4/11  Height:  Ht Readings from Last 1 Encounters:  07/20/20 '5\' 6"'$  (1.676 m)   Weight:  Wt Readings from Last 1 Encounters:  08/08/20 78.6 kg   Ideal Body Weight:  59.1 kg  BMI:  Body mass index is 27.97 kg/m.  Estimated Nutritional  Needs:   Kcal:  1700-1900 kcal   Protein:  85-100 grams   Fluid:  1.5 L restriction   Mariana Single RD, LDN Clinical Nutrition Pager listed in [place]

## 2020-08-08 NOTE — Procedures (Signed)
PROCEDURE SUMMARY:  Successful US guided left thoracentesis. Yielded 500 ml of bloody fluid. Pt tolerated procedure well. No immediate complications.  CXR ordered; no post-procedure pneumothorax identified  EBL < 2 mL  Theresa Duty, NP 08/08/2020 4:16 PM

## 2020-08-08 NOTE — Care Management Important Message (Signed)
Important Message  Patient Details  Name: ZAELIA FINCKE MRN: [id number] Date of Birth: [birth year]   Medicare Important Message Given:  Yes     [name] 08/08/2020, 9:51 AM

## 2020-08-08 NOTE — Progress Notes (Signed)
Mobility Specialist: Progress Note   08/08/20 1600  Mobility  Activity Ambulated to bathroom  Level of Assistance Minimal assist, patient does 75% or more  Assistive Device Front wheel walker  Distance Ambulated (ft) 30 ft  Mobility Response Tolerated well  Mobility performed by Mobility specialist  Bed Position Chair  $Mobility charge 1 Mobility   Post-Mobility: 112 HR  Pt c/o feeling a little light headed after sitting EOB but said she was fine after standing. Pt to BR and then to chair per RN request.   Harrell Gave Alleah Dearman Mobility Specialist Mobility Specialist Phone: [phone]

## 2020-08-09 DIAGNOSIS — I5021 Acute systolic (congestive) heart failure: Secondary | ICD-10-CM | POA: Diagnosis not present

## 2020-08-09 DIAGNOSIS — I214 Non-ST elevation (NSTEMI) myocardial infarction: Secondary | ICD-10-CM | POA: Diagnosis not present

## 2020-08-09 DIAGNOSIS — Z951 Presence of aortocoronary bypass graft: Secondary | ICD-10-CM | POA: Diagnosis not present

## 2020-08-09 LAB — GLUCOSE, CAPILLARY
Glucose-Capillary: 185 mg/dL — ABNORMAL HIGH (ref 70–99)
Glucose-Capillary: 273 mg/dL — ABNORMAL HIGH (ref 70–99)
Glucose-Capillary: 123 mg/dL — ABNORMAL HIGH (ref 70–99)
Glucose-Capillary: 89 mg/dL (ref 70–99)

## 2020-08-09 LAB — BASIC METABOLIC PANEL
Anion gap: 8 (ref 5–15)
BUN: 25 mg/dL — ABNORMAL HIGH (ref 8–23)
CO2: 22 mmol/L (ref 22–32)
Calcium: 8.5 mg/dL — ABNORMAL LOW (ref 8.9–10.3)
Chloride: 109 mmol/L (ref 98–111)
Creatinine, Ser: 1.65 mg/dL — ABNORMAL HIGH (ref 0.44–1.00)
GFR, Estimated: 32 mL/min — ABNORMAL LOW (ref >60)
Glucose, Bld: 169 mg/dL — ABNORMAL HIGH (ref 70–99)
Potassium: 4 mmol/L (ref 3.5–5.1)
Sodium: 139 mmol/L (ref 135–145)

## 2020-08-09 LAB — SARS CORONAVIRUS 2 (TAT 6-24 HRS): SARS Coronavirus 2: NEGATIVE

## 2020-08-09 LAB — MAGNESIUM: Magnesium: 2.1 mg/dL (ref 1.7–2.4)

## 2020-08-09 MED ORDER — LOSARTAN POTASSIUM 25 MG PO TABS
12.5000 mg | ORAL_TABLET | Freq: Every day | ORAL | Status: DC
  Administered 2020-08-09: 12.5 mg via ORAL
  Filled 2020-08-09: qty 1

## 2020-08-09 MED ORDER — METOPROLOL SUCCINATE ER 25 MG PO TB24
25.0000 mg | ORAL_TABLET | Freq: Every day | ORAL | Status: DC
  Administered 2020-08-09 – 2020-08-10 (×2): 25 mg via ORAL
  Filled 2020-08-09 (×2): qty 1

## 2020-08-09 NOTE — Progress Notes (Signed)
CARDIAC REHAB PHASE I   D/c education completed with pt. Pt educated on importance of site care and monitoring incisions daily. Encouraged continued IS use, walks, and sternal precautions. Pt given in-the-tube sheet along with heart healthy and diabetic diets. Reviewed restrictions and exercise guidelines. Will refer to CRP II [place].  [id number] [name], RN BSN 08/09/2020 10:23 AM

## 2020-08-09 NOTE — Progress Notes (Addendum)
Progress Note  Patient Name: Kristin [name] Date of Encounter: 08/09/2020  [facility] Cardiologist: No primary care provider on file.   Subjective   - 3/24 NSTEMI - 3/24 Emergent CABG x 5 (LIMA to LAD, SEQ SVG to OM and Diagonal, SEQ to PL and PDA) - 3/25 take back to OR for tamponade. EF on TEE 55%, RV mildly down (3/25) -3/26 Extubated -3/28 Milrinone stopped. Midodrine started.  -3/30 S/P CRT-P.  -4/5 left thoracentesis, started on cefepime -4/8 had several episodes of Afib; started on apixaban -4/11 repeat L thora with 500 out  Had repeat left thora yesterday. Feels better. Less sob. Torsemide 20 started yesterday. Creatinine unchanged. Weight exactly the sam the last 3 days 173.28 -> 173.28 -> 173.28 (?)   Inpatient Medications    Scheduled Meds: . acetaminophen (TYLENOL) oral liquid 160 mg/5 mL  1,000 mg Oral Q6H   Or  . acetaminophen  1,000 mg Oral Q6H  . apixaban  5 mg Oral BID  . aspirin  81 mg Oral Daily  . atorvastatin  80 mg Oral q1800  . bisacodyl  10 mg Oral Daily   Or  . bisacodyl  10 mg Rectal Daily  . docusate sodium  200 mg Oral Daily  . feeding supplement  237 mL Oral TID BM  . insulin aspart  0-24 Units Subcutaneous TID WC  . insulin aspart  5 Units Subcutaneous TID WC  . insulin detemir  24 Units Subcutaneous Daily  . mouth rinse  15 mL Mouth Rinse BID  . megestrol  40 mg Oral Daily  . melatonin  3 mg Oral QHS  . multivitamin with minerals  1 tablet Oral Daily  . pantoprazole  40 mg Oral Daily  . polyethylene glycol  17 g Oral Daily  . potassium chloride  20 mEq Oral Daily  . QUEtiapine  50 mg Oral QHS  . sodium chloride flush  3 mL Intravenous Q12H  . spironolactone  12.5 mg Oral Daily  . torsemide  20 mg Oral Daily   Continuous Infusions:  PRN Meds: acetaminophen, dextrose, guaiFENesin-dextromethorphan, lidocaine, magic mouthwash, metoprolol tartrate, ondansetron (ZOFRAN) IV, oxyCODONE, traMADol   Vital Signs    Vitals:    08/08/20 2024 08/08/20 2336 08/09/20 0314 08/09/20 0642  BP: (!) 111/54 (!) 129/58 (!) 105/51   Pulse: 70 75 68   Resp: '18 20 18   '$ Temp: 98.5 F (36.9 C) 98.5 F (36.9 C) 98.1 F (36.7 C)   TempSrc: Oral Oral Oral   SpO2:   95%   Weight:    78.6 kg  Height:        Intake/Output Summary (Last 24 hours) at 08/09/2020 0700 Last data filed at 08/09/2020 0314 Gross per 24 hour  Intake 240 ml  Output 200 ml  Net 40 ml   Last 3 Weights 08/09/2020 08/08/2020 08/07/2020  Weight (lbs) 173 lb 4.5 oz 173 lb 4.5 oz 173 lb 4.5 oz  Weight (kg) 78.6 kg 78.6 kg 78.6 kg      Telemetry    Sinus with vpacing 60-70s Personally reviewed    Physical Exam   General:  Sitting up in bed . No resp difficulty HEENT: normal Neck: supple. no JVD. Carotids 2+ bilat; no bruits. No lymphadenopathy or thryomegaly appreciated. Cor: PMI nondisplaced. Regular rate & rhythm. No rubs, gallops or murmurs. Lungs: clear mildly dulla t bases Abdomen: soft, nontender, nondistended. No hepatosplenomegaly. No bruits or masses. Good bowel sounds. Extremities: no cyanosis, clubbing, rash,  edema Neuro: alert & orientedx3, cranial nerves grossly intact. moves all 4 extremities w/o difficulty. Affect pleasant   Labs    High Sensitivity Troponin:   Recent Labs  Lab 07/20/20 1538 07/20/20 1729  TROPONINIHS 3,198* 3,282*      Chemistry Recent Labs  Lab 08/06/20 0117 08/07/20 0058 08/08/20 0126 08/09/20 0256  NA 139 138 140 139  K 3.8 4.3 4.8 4.0  CL 107 109 110 109  CO2 22 22 19* 22  GLUCOSE 208* 233* 178* 169*  BUN 30* 27* 25* 25*  CREATININE 1.89* 1.75* 1.61* 1.65*  CALCIUM 8.4* 8.3* 8.6* 8.5*  PROT 6.0*  --   --   --   ALBUMIN 2.3*  --   --   --   AST 32  --   --   --   ALT 32  --   --   --   ALKPHOS 85  --   --   --   BILITOT 1.0  --   --   --   GFRNONAA 27* 29* 33* 32*  ANIONGAP '10 7 11 8     '$ Hematology Recent Labs  Lab 08/05/20 0206 08/06/20 0117 08/07/20 0058  WBC 7.1 7.7 7.5  RBC  2.76* 2.78* 2.85*  HGB 8.5* 8.9* 8.8*  HCT 27.1* 28.3* 28.9*  MCV 98.2 101.8* 101.4*  MCH 30.8 32.0 30.9  MCHC 31.4 31.4 30.4  RDW 18.5* 18.3* 18.0*  PLT 426* 423* 418*    BNPNo results for input(s): BNP, PROBNP in the last 168 hours.   DDimer No results for input(s): DDIMER in the last 168 hours.   Radiology    DG Chest 1 View  Result Date: 08/08/2020 CLINICAL DATA:  Left-sided thoracentesis EXAM: CHEST  1 VIEW COMPARISON:  08/08/2020 FINDINGS: Single frontal view of the chest demonstrates marked decrease of left pleural effusion, with only trace residual fluid at the left costophrenic angle. No evidence of pneumothorax. Trace right pleural effusion unchanged. Stable central vascular congestion. Cardiac silhouette is stable, with multi lead pacer unchanged. IMPRESSION: 1. No complication after left thoracentesis. Trace residual left pleural effusion. 2. Stable trace right pleural effusion. Electronically Signed   By: Randa Ngo M.D.   On: 08/08/2020 16:06   DG Chest 2 View  Result Date: 08/08/2020 CLINICAL DATA:  Left-sided pleural effusion EXAM: CHEST - 2 VIEW COMPARISON:  08/07/2020 FINDINGS: Cardiac shadow is enlarged but stable. Postsurgical change and pacing device are again noted and stable. Stable loculated appearing left pleural effusion is noted. Small right effusion is seen as well. No new focal effusion or infiltrate is noted. No pneumothorax is seen. IMPRESSION: Loculated left-sided pleural effusion. Small right-sided effusion is noted as well. Electronically Signed   By: Inez Catalina M.D.   On: 08/08/2020 08:12   IR THORACENTESIS ASP PLEURAL SPACE W/IMG GUIDE  Result Date: 08/08/2020 INDICATION: Patient with recent CABG with left-sided pleural effusion. Interventional radiology asked to perform a therapeutic thoracentesis. EXAM: ULTRASOUND GUIDED THORACENTESIS MEDICATIONS: 1% lidocaine 10 mL COMPLICATIONS: None immediate. PROCEDURE: An ultrasound guided thoracentesis was  thoroughly discussed with the patient and questions answered. The benefits, risks, alternatives and complications were also discussed. The patient understands and wishes to proceed with the procedure. Written consent was obtained. Ultrasound was performed to localize and mark an adequate pocket of fluid in the left chest. The area was then prepped and draped in the normal sterile fashion. 1% Lidocaine was used for local anesthesia. Under ultrasound guidance a 6 Fr Safe-T-Centesis catheter  was introduced. Thoracentesis was performed. The catheter was removed and a dressing applied. FINDINGS: A total of approximately 500 mL of bloody fluid was removed. IMPRESSION: Successful ultrasound guided left thoracentesis yielding 500 mL of pleural fluid. Read by: Soyla Dryer, NP Electronically Signed   By: Corrie Mckusick D.O.   On: 08/08/2020 16:18    Cardiac Studies   TTE 08/01/20: IMPRESSIONS  1. Inferior basal septal distal anterior wall hypokinesis consistent with  multi vessel CAD. Left ventricular ejection fraction, by estimation, is 40  to 45%. The left ventricle has mildly decreased function. The left  ventricle demonstrates regional wall  motion abnormalities (see scoring diagram/findings for description). The  left ventricular internal cavity size was moderately dilated. Left  ventricular diastolic parameters are consistent with Grade I diastolic  dysfunction (impaired relaxation).  2. ? catheters in RA/RV . Right ventricular systolic function is normal.  The right ventricular size is normal. There is normal pulmonary artery  systolic pressure.  3. Left atrial size was mildly dilated.  4. Right atrial size was mildly dilated.  5. A small pericardial effusion is present. The pericardial effusion is  lateral to the left ventricle and posterior to the left ventricle.  Moderate pleural effusion in the left lateral region.  6. The mitral valve is abnormal. Mild mitral valve regurgitation. No   evidence of mitral stenosis.  7. Tricuspid valve regurgitation is moderate.  8. The aortic valve is normal in structure. Aortic valve regurgitation is  trivial. No aortic stenosis is present.  9. The inferior vena cava is dilated in size with >50% respiratory  variability, suggesting right atrial pressure of 8 mmHg.   Cath 07/21/20:  Mid LM lesion is 95% stenosed.  Ost Cx lesion is 99% stenosed.  Dist LM to Ost LAD lesion is 95% stenosed.  Mid LAD lesion is 95% stenosed.  Prox Cx lesion is 90% stenosed.  Prox RCA to Mid RCA lesion is 80% stenosed.  RPDA lesion is 80% stenosed.  RPAV lesion is 80% stenosed.  Ost LM lesion is 30% stenosed.   Patient Profile     80 y.o. female with history of DM, HTN, and obesity who presented with NSTEMI found to have critical LM and multivessel CAD prompting IABP placement and emergent CABG x5 on 07/21/20 with (LIMA to LAD, SEQ SVG to OM and Diagonal, SEQ to PL and PDA). Post op course complicated by shock found to have tamponade and was taken back to the OR on 07/22/20. Successfully extubated post-procedure and required milrinone. Underwent CRT-P for CHB on 3/30. Now weaned off milrinone and midodrine.   Assessment & Plan    1. Ischemic Cardiomyopathy, Cardiogenic shock - S/p CABG 3/24 with take back for tamponade on 3/25 - LVEF 55% on post-op TEE 3/25-->40-45% on TTE 08/01/20 - Improved - continue torsemide 20 daily  - continue spiro 12.5 - add losartan 12.5 qhs and toprol 25  2. CAD s/p NSTEMI  - CABG x 5 3/24 as detailed above - No s/s ischemia - Continue ASA and apixaban; plavix stopped due to need for eliquis - Continue statin - Add toprol 25  3. Acute hypoxic respiratory failure post-op/ left pleural effusion  -S/p left sided thora on 4/5; repeat 4/11 - Completed cefepime for aspiration pneumonia -Continued to encourage IS use  4. AKI - improving - secondary to shock/ATN - baseline creatinine 1.1 -> peaked at 2>now  1.87 -> 1.61 -> 1.65  5. Acute blood loss anemia, post -op - stable at  8.8  - continue to trend  6. Hypernatremia -resolved  7. Thrombocytopenia - resolved  8. Dysphagia - Speech following now on dysphagia 3 diet   9. Deconditioning - c/w PT/OT - insurance denied CIR  - Plan d/c to SNF. Possibly today   10. CHB - S/P CRT-P-- 07/27/20  11. PAF - Had several recurrent episodes of Afib on the monitor on 4/7 and 4/9 - Started on apixaban '5mg'$  BID for AC - Quiescent No bleeding   Ok for d/c from our perspective.  Cardiac meds on d/c  Apixaban 5 bid ECASA 81 daily Losartan 12.5 qhs Spiro 12.5 daily Torsemide 20 daily  Atorva 80 daily Toprol 25  Will arrange HF f/u.      For questions or updates, please contact [place] Please consult [email or web address] for contact info under        Signed, [name], MD  08/09/2020, 7:00 AM

## 2020-08-09 NOTE — Progress Notes (Signed)
[address]       [place] [address]             [phone]      13 Days Post-Op Procedure(s) (LRB): BIV PACEMAKER INSERTION CRT-P (N/A) Subjective: Feels a bit better, some SOB persists  Objective: Vital signs in last 24 hours: Temp:  [97.6 F (36.4 C)-98.5 F (36.9 C)] 98.1 F (36.7 C) (04/12 0314) Pulse Rate:  [68-102] 68 (04/12 0314) Cardiac Rhythm: Ventricular paced (04/12 0100) Resp:  [18-20] 18 (04/12 0314) BP: (105-138)/(51-98) 105/51 (04/12 0314) SpO2:  [92 %-100 %] 95 % (04/12 0314) Weight:  [78.6 kg] 78.6 kg (04/12 0642)  Hemodynamic parameters for last 24 hours:    Intake/Output from previous day: 04/11 0701 - 04/12 0700 In: 240 [P.O.:240] Out: 200 [Urine:200] Intake/Output this shift: No intake/output data recorded.  General appearance: alert, cooperative and no distress Heart: regular rate and rhythm Lungs: mildly dim in bases Abdomen: benign Extremities: trace edema Wound: incis healing well  Lab Results: Recent Labs    08/07/20 0058  WBC 7.5  HGB 8.8*  HCT 28.9*  PLT 418*   BMET:  Recent Labs    08/08/20 0126 08/09/20 0256  NA 140 139  K 4.8 4.0  CL 110 109  CO2 19* 22  GLUCOSE 178* 169*  BUN 25* 25*  CREATININE 1.61* 1.65*  CALCIUM 8.6* 8.5*    PT/INR: No results for input(s): LABPROT, INR in the last 72 hours. ABG    Component Value Date/Time   PHART 7.442 07/23/2020 0434   HCO3 29.2 (H) 07/23/2020 0434   TCO2 31 07/23/2020 0434   ACIDBASEDEF 1.0 07/22/2020 0814   O2SAT 59.9 07/26/2020 0357   CBG (last 3)  Recent Labs    08/08/20 1619 08/08/20 2027 08/09/20 0643  GLUCAP 147* 175* 185*    Meds Scheduled Meds: . acetaminophen (TYLENOL) oral liquid 160 mg/5 mL  1,000 mg Oral Q6H   Or  . acetaminophen  1,000 mg Oral Q6H  . apixaban  5 mg Oral BID  . aspirin  81 mg Oral Daily  . atorvastatin  80 mg Oral q1800  . bisacodyl  10 mg Oral Daily   Or  . bisacodyl  10 mg Rectal Daily  .  docusate sodium  200 mg Oral Daily  . feeding supplement  237 mL Oral TID BM  . insulin aspart  0-24 Units Subcutaneous TID WC  . insulin aspart  5 Units Subcutaneous TID WC  . insulin detemir  24 Units Subcutaneous Daily  . mouth rinse  15 mL Mouth Rinse BID  . megestrol  40 mg Oral Daily  . melatonin  3 mg Oral QHS  . multivitamin with minerals  1 tablet Oral Daily  . pantoprazole  40 mg Oral Daily  . polyethylene glycol  17 g Oral Daily  . potassium chloride  20 mEq Oral Daily  . QUEtiapine  50 mg Oral QHS  . sodium chloride flush  3 mL Intravenous Q12H  . spironolactone  12.5 mg Oral Daily  . torsemide  20 mg Oral Daily   Continuous Infusions: PRN Meds:.acetaminophen, dextrose, guaiFENesin-dextromethorphan, lidocaine, magic mouthwash, metoprolol tartrate, ondansetron (ZOFRAN) IV, oxyCODONE, traMADol  Xrays DG Chest 1 View  Result Date: 08/08/2020 CLINICAL DATA:  Left-sided thoracentesis EXAM: CHEST  1 VIEW COMPARISON:  08/08/2020 FINDINGS: Single frontal view of the chest demonstrates marked decrease of left pleural effusion, with only trace residual fluid at the left costophrenic  angle. No evidence of pneumothorax. Trace right pleural effusion unchanged. Stable central vascular congestion. Cardiac silhouette is stable, with multi lead pacer unchanged. IMPRESSION: 1. No complication after left thoracentesis. Trace residual left pleural effusion. 2. Stable trace right pleural effusion. Electronically Signed   By: Randa Ngo M.D.   On: 08/08/2020 16:06   DG Chest 2 View  Result Date: 08/08/2020 CLINICAL DATA:  Left-sided pleural effusion EXAM: CHEST - 2 VIEW COMPARISON:  08/07/2020 FINDINGS: Cardiac shadow is enlarged but stable. Postsurgical change and pacing device are again noted and stable. Stable loculated appearing left pleural effusion is noted. Small right effusion is seen as well. No new focal effusion or infiltrate is noted. No pneumothorax is seen. IMPRESSION: Loculated  left-sided pleural effusion. Small right-sided effusion is noted as well. Electronically Signed   By: Inez Catalina M.D.   On: 08/08/2020 08:12   IR THORACENTESIS ASP PLEURAL SPACE W/IMG GUIDE  Result Date: 08/08/2020 INDICATION: Patient with recent CABG with left-sided pleural effusion. Interventional radiology asked to perform a therapeutic thoracentesis. EXAM: ULTRASOUND GUIDED THORACENTESIS MEDICATIONS: 1% lidocaine 10 mL COMPLICATIONS: None immediate. PROCEDURE: An ultrasound guided thoracentesis was thoroughly discussed with the patient and questions answered. The benefits, risks, alternatives and complications were also discussed. The patient understands and wishes to proceed with the procedure. Written consent was obtained. Ultrasound was performed to localize and mark an adequate pocket of fluid in the left chest. The area was then prepped and draped in the normal sterile fashion. 1% Lidocaine was used for local anesthesia. Under ultrasound guidance a 6 Fr Safe-T-Centesis catheter was introduced. Thoracentesis was performed. The catheter was removed and a dressing applied. FINDINGS: A total of approximately 500 mL of bloody fluid was removed. IMPRESSION: Successful ultrasound guided left thoracentesis yielding 500 mL of pleural fluid. Read by: Soyla Dryer, NP Electronically Signed   By: Corrie Mckusick D.O.   On: 08/08/2020 16:18    Assessment/Plan: S/P Procedure(s) (LRB): BIV PACEMAKER INSERTION CRT-P (N/A)  1 afeb, VSS 2 sats good on RA 3 creat stable at 1.6 4 500 cc fluid obtained from thoracentesis yesterday (L) 5 BS adeq control 6 insurance authorization is being worked on as is process for SNF transfer 7 AHF team to see if felt to be stable for tx from their perspective  LOS: 20 days    John Giovanni 08/09/2020

## 2020-08-09 NOTE — Progress Notes (Signed)
Mobility Specialist: Progress Note   08/09/20 1323  Mobility  Activity Ambulated in hall  Level of Assistance Contact guard assist, steadying assist  Assistive Device Four wheel walker  Distance Ambulated (ft) 300 ft  Mobility Response Tolerated well  Mobility performed by Mobility specialist  $Mobility charge 1 Mobility   During Mobility: 95 HR, 100% SpO2 Post-Mobility: 109 HR, 100% SpO2  Pt stopped for short standing break lasting <1 minute due to SOB and fatigue. Pt had no c/o pain. Pt to bed per request after walk with call bell at her side.   [facility] Oleg Oleson Mobility Specialist Mobility Specialist Phone: [phone]

## 2020-08-09 NOTE — TOC Progression Note (Signed)
Transition of Care [facility]) - Progression Note    Patient Details  Name: RAYCHEL HUPPE MRN: [id number] Date of Birth: August 04, 1940  Transition of Care [facility]) CM/SW [place], [place] Phone Number: 08/09/2020, 11:15 AM  Clinical Narrative:     CSW contacted SNF- insurance authorization still pending.  Thurmond Butts, MSW, LCSW Clinical Social Worker   Expected Discharge Plan: Skilled Nursing Facility Barriers to Discharge: Continued Medical Work up  Expected Discharge Plan and Services Expected Discharge Plan: [place] In-house Referral: Clinical Social Work Discharge Planning Services: CM Consult Post Acute Care Choice: [place] arrangements for the past 2 months: Single Family Home                                       Social Determinants of Health (SDOH) Interventions    Readmission Risk Interventions No flowsheet data found.

## 2020-08-10 ENCOUNTER — Telehealth: Payer: Self-pay | Admitting: Emergency Medicine

## 2020-08-10 DIAGNOSIS — Z23 Encounter for immunization: Secondary | ICD-10-CM | POA: Diagnosis not present

## 2020-08-10 DIAGNOSIS — I214 Non-ST elevation (NSTEMI) myocardial infarction: Secondary | ICD-10-CM | POA: Diagnosis not present

## 2020-08-10 DIAGNOSIS — J90 Pleural effusion, not elsewhere classified: Secondary | ICD-10-CM | POA: Diagnosis not present

## 2020-08-10 DIAGNOSIS — Z7401 Bed confinement status: Secondary | ICD-10-CM | POA: Diagnosis not present

## 2020-08-10 DIAGNOSIS — R9431 Abnormal electrocardiogram [ECG] [EKG]: Secondary | ICD-10-CM | POA: Diagnosis not present

## 2020-08-10 DIAGNOSIS — E785 Hyperlipidemia, unspecified: Secondary | ICD-10-CM | POA: Diagnosis not present

## 2020-08-10 DIAGNOSIS — I959 Hypotension, unspecified: Secondary | ICD-10-CM | POA: Diagnosis not present

## 2020-08-10 DIAGNOSIS — M2550 Pain in unspecified joint: Secondary | ICD-10-CM | POA: Diagnosis not present

## 2020-08-10 DIAGNOSIS — I4891 Unspecified atrial fibrillation: Secondary | ICD-10-CM | POA: Diagnosis not present

## 2020-08-10 DIAGNOSIS — R2689 Other abnormalities of gait and mobility: Secondary | ICD-10-CM | POA: Diagnosis not present

## 2020-08-10 DIAGNOSIS — I2510 Atherosclerotic heart disease of native coronary artery without angina pectoris: Secondary | ICD-10-CM | POA: Diagnosis not present

## 2020-08-10 DIAGNOSIS — I517 Cardiomegaly: Secondary | ICD-10-CM | POA: Diagnosis not present

## 2020-08-10 DIAGNOSIS — R262 Difficulty in walking, not elsewhere classified: Secondary | ICD-10-CM | POA: Diagnosis not present

## 2020-08-10 DIAGNOSIS — Z951 Presence of aortocoronary bypass graft: Secondary | ICD-10-CM | POA: Diagnosis not present

## 2020-08-10 DIAGNOSIS — N390 Urinary tract infection, site not specified: Secondary | ICD-10-CM | POA: Diagnosis not present

## 2020-08-10 DIAGNOSIS — I700 Atherosclerosis of aorta: Secondary | ICD-10-CM | POA: Diagnosis not present

## 2020-08-10 DIAGNOSIS — E1151 Type 2 diabetes mellitus with diabetic peripheral angiopathy without gangrene: Secondary | ICD-10-CM | POA: Diagnosis not present

## 2020-08-10 DIAGNOSIS — R531 Weakness: Secondary | ICD-10-CM | POA: Diagnosis not present

## 2020-08-10 DIAGNOSIS — E119 Type 2 diabetes mellitus without complications: Secondary | ICD-10-CM | POA: Diagnosis not present

## 2020-08-10 DIAGNOSIS — I10 Essential (primary) hypertension: Secondary | ICD-10-CM | POA: Diagnosis not present

## 2020-08-10 DIAGNOSIS — M6281 Muscle weakness (generalized): Secondary | ICD-10-CM | POA: Diagnosis not present

## 2020-08-10 DIAGNOSIS — J9811 Atelectasis: Secondary | ICD-10-CM | POA: Diagnosis not present

## 2020-08-10 LAB — BASIC METABOLIC PANEL
Anion gap: 8 (ref 5–15)
BUN: 26 mg/dL — ABNORMAL HIGH (ref 8–23)
CO2: 23 mmol/L (ref 22–32)
Calcium: 8.8 mg/dL — ABNORMAL LOW (ref 8.9–10.3)
Chloride: 108 mmol/L (ref 98–111)
Creatinine, Ser: 1.63 mg/dL — ABNORMAL HIGH (ref 0.44–1.00)
GFR, Estimated: 32 mL/min — ABNORMAL LOW (ref >60)
Glucose, Bld: 137 mg/dL — ABNORMAL HIGH (ref 70–99)
Potassium: 3.7 mmol/L (ref 3.5–5.1)
Sodium: 139 mmol/L (ref 135–145)

## 2020-08-10 LAB — MAGNESIUM: Magnesium: 2.1 mg/dL (ref 1.7–2.4)

## 2020-08-10 LAB — GLUCOSE, CAPILLARY
Glucose-Capillary: 147 mg/dL — ABNORMAL HIGH (ref 70–99)
Glucose-Capillary: 228 mg/dL — ABNORMAL HIGH (ref 70–99)

## 2020-08-10 MED ORDER — POLYETHYLENE GLYCOL 3350 17 G PO PACK: 17.0000 g | PACK | Freq: Every day | ORAL | 0 refills | Status: DC

## 2020-08-10 MED ORDER — ATORVASTATIN CALCIUM 80 MG PO TABS: 80.0000 mg | ORAL_TABLET | Freq: Every day | ORAL | 1 refills | Status: DC

## 2020-08-10 MED ORDER — LOSARTAN POTASSIUM 25 MG PO TABS: 12.5000 mg | ORAL_TABLET | Freq: Every day | ORAL | 1 refills | Status: DC

## 2020-08-10 MED ORDER — ACETAMINOPHEN 325 MG PO TABS
650.0000 mg | ORAL_TABLET | Freq: Four times a day (QID) | ORAL | Status: AC | PRN
Start: 2020-08-10 — End: ?

## 2020-08-10 MED ORDER — TRAMADOL HCL 50 MG PO TABS: 50.0000 mg | ORAL_TABLET | Freq: Four times a day (QID) | ORAL | 0 refills | Status: DC | PRN

## 2020-08-10 MED ORDER — POTASSIUM CHLORIDE CRYS ER 20 MEQ PO TBCR
20.0000 meq | EXTENDED_RELEASE_TABLET | Freq: Once | ORAL | Status: AC
  Administered 2020-08-10: 20 meq via ORAL
  Filled 2020-08-10: qty 1

## 2020-08-10 MED ORDER — MELATONIN 3 MG PO TABS: 3.0000 mg | ORAL_TABLET | Freq: Every day | ORAL | 0 refills | Status: DC

## 2020-08-10 MED ORDER — INSULIN DETEMIR 100 UNIT/ML ~~LOC~~ SOLN: 24.0000 [IU] | Freq: Every day | SUBCUTANEOUS | 11 refills | Status: DC

## 2020-08-10 MED ORDER — MAGIC MOUTHWASH: 15.0000 mL | Freq: Four times a day (QID) | ORAL | 0 refills | Status: DC | PRN

## 2020-08-10 MED ORDER — QUETIAPINE FUMARATE 50 MG PO TABS: 50.0000 mg | ORAL_TABLET | Freq: Every day | ORAL | Status: DC

## 2020-08-10 MED ORDER — ENSURE ENLIVE PO LIQD: 237.0000 mL | Freq: Three times a day (TID) | ORAL | 12 refills | Status: DC

## 2020-08-10 MED ORDER — METOPROLOL SUCCINATE ER 25 MG PO TB24: 25.0000 mg | ORAL_TABLET | Freq: Every day | ORAL | 1 refills | Status: AC

## 2020-08-10 MED ORDER — ASPIRIN 81 MG PO CHEW: 81.0000 mg | CHEWABLE_TABLET | Freq: Every day | ORAL | Status: DC

## 2020-08-10 MED ORDER — SPIRONOLACTONE 25 MG PO TABS: 12.5000 mg | ORAL_TABLET | Freq: Every day | ORAL | Status: DC

## 2020-08-10 MED ORDER — APIXABAN 5 MG PO TABS: 5.0000 mg | ORAL_TABLET | Freq: Two times a day (BID) | ORAL | Status: DC

## 2020-08-10 MED ORDER — TORSEMIDE 20 MG PO TABS: 20.0000 mg | ORAL_TABLET | Freq: Every day | ORAL | Status: DC

## 2020-08-10 NOTE — Progress Notes (Signed)
Report given to Maudie Mercury, RN at [facility].  PTAR in house preparing to transfer patient.

## 2020-08-10 NOTE — TOC Progression Note (Signed)
Transition of Care (TOC) - Progression Note  Heart Failure   Patient Details  Name: Kristin [name] MRN: [id number] Date of Birth: 02/06/1941  Transition of Care [facility]) CM/SW Contact  Jeremy Mclamb, [place] Phone Number: 08/10/2020, 9:08 AM  Clinical Narrative:    CSW called [facility] and spoke with Olivia Mackie [phone]) and verified the insurance authorization [id number] for 7 days. Olivia Mackie reported Ms. Uhlmann will be in room #708 when she is ready for discharge. CSW will keep Olivia Mackie informed about Ms. Rogowski's disposition/discharge.  TOC will continue to follow for d/c needs.    Expected Discharge Plan: [place] Barriers to Discharge: Continued Medical Work up  Expected Discharge Plan and Services Expected Discharge Plan: [place] In-house Referral: Clinical Social Work Discharge Planning Services: CM Consult Post Acute Care Choice: [place] arrangements for the past 2 months: Single Family Home                                       Social Determinants of Health (SDOH) Interventions    Readmission Risk Interventions No flowsheet data found.  Belisa Eichholz, MSW, [place] Heart Failure Social Worker

## 2020-08-10 NOTE — Progress Notes (Signed)
CARDIAC REHAB PHASE I   PRE:  Rate/Rhythm: 99   BP:  Sitting: 103/56      SaO2: 94 RA  MODE:  Ambulation: 150 ft x2   POST:  Rate/Rhythm: 107  BP:  Sitting: 123/59    SaO2: 92 RA  Pt agreeable to ambulate. Pt ambulated 141f in hallway standby assist with rollator. Pt took a short standing rest break, coached through purse lipped breathing. Pt able to ambulate back to room. Pt settled in recliner. Reinforced d/c education. Pt than helped to BR, BR call light within reach.   [id number] Falco RN BSN 08/10/2020 9:00 AM

## 2020-08-10 NOTE — TOC Transition Note (Signed)
Transition of Care [facility]) - CM/SW Discharge Note Heart Failure   Patient Details  Name: Kristin [name] MRN: [id number] Date of Birth: 24-Jul-1940  Transition of Care [facility]) CM/SW Contact:  [place], [place] Phone Number: 08/10/2020, 10:34 AM   Clinical Narrative:    Patient will DC to: SNF [facility] Anticipated DC date: 08/10/20 Family notified: Daughter Unice Cobble [phone] Transport by: Corey Harold   Per MD patient ready for DC to SNF [facility] in [place]. RN to call report prior to discharge ([phone] RM# [id number]). RN, patient, patient's family, and facility notified of DC. Discharge Summary and FL2 sent to facility. DC packet on chart. Ambulance transport requested for patient.   CSW will sign off for now as social work intervention is no longer needed. Please consult [place] again if new needs arise.      Final next level of care: Skilled Nursing Facility Barriers to Discharge: No Barriers Identified   Patient Goals and CMS Choice Patient states their goals for this hospitalization and ongoing recovery are:: to return home CMS Medicare.gov Compare Post Acute Care list provided to:: Patient Choice offered to / list presented to : Patient  Discharge Placement PASRR number recieved: 08/10/20 Existing PASRR number confirmed : 08/10/20          Patient chooses bed at: [facility],  Patient to be transferred to facility by: [place] Name of family member notified: Unice Cobble, daughter Patient and family notified of of transfer: 08/10/20  Discharge Plan and Services In-house Referral: Clinical Social Work Discharge Planning Services: CM Consult Post Acute Care Choice: [place]                               Social Determinants of Health (SDOH) Interventions     Readmission Risk Interventions No flowsheet data found.  Cynthea Zachman, MSW, [place] Heart Failure Social Worker

## 2020-08-10 NOTE — Progress Notes (Signed)
[address]       [place] [address]             [phone]      14 Days Post-Op Procedure(s) (LRB): BIV PACEMAKER INSERTION CRT-P (N/A) Subjective:  conts to feel better  Objective: Vital signs in last 24 hours: Temp:  [97.7 F (36.5 C)-98.7 F (37.1 C)] 98.5 F (36.9 C) (04/13 0333) Pulse Rate:  [95-98] 95 (04/13 0333) Cardiac Rhythm: Heart block;Bundle branch block (04/12 1900) Resp:  [16] 16 (04/13 0333) BP: (99-119)/(54-66) 107/61 (04/13 0333) SpO2:  [98 %-100 %] 98 % (04/13 0333) Weight:  [75.6 kg] 75.6 kg (04/13 0333)  Hemodynamic parameters for last 24 hours:    Intake/Output from previous day: 04/12 0701 - 04/13 0700 In: -  Out: 2700 [Urine:2700] Intake/Output this shift: No intake/output data recorded.  General appearance: alert, cooperative and no distress Heart: regular rate and rhythm Lungs: clear to auscultation bilaterally Abdomen: benign exam Extremities: no edema Wound: incis healing well Mildly dim BS in left base   Lab Results: No results for input(s): WBC, HGB, HCT, PLT in the last 72 hours. BMET: Recent Labs    08/09/20 0256 08/10/20 0316  NA 139 139  K 4.0 3.7  CL 109 108  CO2 22 23  GLUCOSE 169* 137*  BUN 25* 26*  CREATININE 1.65* 1.63*  CALCIUM 8.5* 8.8*    PT/INR: No results for input(s): LABPROT, INR in the last 72 hours. ABG    Component Value Date/Time   PHART 7.442 07/23/2020 0434   HCO3 29.2 (H) 07/23/2020 0434   TCO2 31 07/23/2020 0434   ACIDBASEDEF 1.0 07/22/2020 0814   O2SAT 59.9 07/26/2020 0357   CBG (last 3)  Recent Labs    08/09/20 1623 08/09/20 2104 08/10/20 0615  GLUCAP 123* 89 147*    Meds Scheduled Meds: . acetaminophen (TYLENOL) oral liquid 160 mg/5 mL  1,000 mg Oral Q6H   Or  . acetaminophen  1,000 mg Oral Q6H  . apixaban  5 mg Oral BID  . aspirin  81 mg Oral Daily  . atorvastatin  80 mg Oral q1800  . bisacodyl  10 mg Oral Daily   Or  . bisacodyl  10 mg Rectal  Daily  . docusate sodium  200 mg Oral Daily  . feeding supplement  237 mL Oral TID BM  . insulin aspart  0-24 Units Subcutaneous TID WC  . insulin aspart  5 Units Subcutaneous TID WC  . insulin detemir  24 Units Subcutaneous Daily  . losartan  12.5 mg Oral QHS  . mouth rinse  15 mL Mouth Rinse BID  . megestrol  40 mg Oral Daily  . melatonin  3 mg Oral QHS  . metoprolol succinate  25 mg Oral Daily  . multivitamin with minerals  1 tablet Oral Daily  . pantoprazole  40 mg Oral Daily  . polyethylene glycol  17 g Oral Daily  . QUEtiapine  50 mg Oral QHS  . sodium chloride flush  3 mL Intravenous Q12H  . spironolactone  12.5 mg Oral Daily  . torsemide  20 mg Oral Daily   Continuous Infusions: PRN Meds:.acetaminophen, dextrose, guaiFENesin-dextromethorphan, lidocaine, magic mouthwash, metoprolol tartrate, ondansetron (ZOFRAN) IV, oxyCODONE, traMADol  Xrays DG Chest 1 View  Result Date: 08/08/2020 CLINICAL DATA:  Left-sided thoracentesis EXAM: CHEST  1 VIEW COMPARISON:  08/08/2020 FINDINGS: Single frontal view of the chest demonstrates marked decrease of left pleural effusion, with only  trace residual fluid at the left costophrenic angle. No evidence of pneumothorax. Trace right pleural effusion unchanged. Stable central vascular congestion. Cardiac silhouette is stable, with multi lead pacer unchanged. IMPRESSION: 1. No complication after left thoracentesis. Trace residual left pleural effusion. 2. Stable trace right pleural effusion. Electronically Signed   By: Randa Ngo M.D.   On: 08/08/2020 16:06   IR THORACENTESIS ASP PLEURAL SPACE W/IMG GUIDE  Result Date: 08/08/2020 INDICATION: Patient with recent CABG with left-sided pleural effusion. Interventional radiology asked to perform a therapeutic thoracentesis. EXAM: ULTRASOUND GUIDED THORACENTESIS MEDICATIONS: 1% lidocaine 10 mL COMPLICATIONS: None immediate. PROCEDURE: An ultrasound guided thoracentesis was thoroughly discussed with the  patient and questions answered. The benefits, risks, alternatives and complications were also discussed. The patient understands and wishes to proceed with the procedure. Written consent was obtained. Ultrasound was performed to localize and mark an adequate pocket of fluid in the left chest. The area was then prepped and draped in the normal sterile fashion. 1% Lidocaine was used for local anesthesia. Under ultrasound guidance a 6 Fr Safe-T-Centesis catheter was introduced. Thoracentesis was performed. The catheter was removed and a dressing applied. FINDINGS: A total of approximately 500 mL of bloody fluid was removed. IMPRESSION: Successful ultrasound guided left thoracentesis yielding 500 mL of pleural fluid. Read by: Soyla Dryer, NP Electronically Signed   By: Corrie Mckusick D.O.   On: 08/08/2020 16:18    Assessment/Plan: S/P Procedure(s) (LRB): BIV PACEMAKER INSERTION CRT-P (N/A)   1 afeb, VSS, some sinus tachy, to 110's 2 sats good on RA 3 CREAT STABLE AT 1.6, K+ 3.7 - will give a dose of potassium, 20 meq x 1 4 awaiting insurance approval- d/c when approved and bed available 5 [facility] has listed d/c meds- will d/c on their recs   LOS: 21 days    John Giovanni PA-C Pager [phone] 08/10/2020

## 2020-08-10 NOTE — Telephone Encounter (Signed)
Received call from patient's daughter Judeen Hammans and patient with her at time of call. Patient will not be able to attend wound check appointment for pacemaker  Tomorrow because she was discharged from [facility] today after a CABG and ppm placement. Se is currently at [facility] in [place], [place] for rehab after surgery.Remote monitor set up next bed and remote transmission received. Steri-strips still in place with no edema , drainage or bleeding. Will call tomorrow and arrange video visit and change appointments to [facility] for follow-up in the future.

## 2020-08-11 ENCOUNTER — Other Ambulatory Visit: Payer: Self-pay

## 2020-08-11 ENCOUNTER — Telehealth: Payer: Self-pay | Admitting: Emergency Medicine

## 2020-08-11 ENCOUNTER — Ambulatory Visit: Payer: Medicare HMO

## 2020-08-11 NOTE — Patient Outreach (Signed)
[facility]) Care Management  08/11/2020  SHAQUERIA WILMOTH 1941-03-13 [id number]     Transition of Care Referral  Referral Date: 08/11/2020 Referral Source: [facility] Discharge Report Date of Discharge: 08/10/2020 Facility: [place]: [facility]    Referral received. Upon chart review, noted that patient discharged to SNF for rehab.    Plan: RN CM will close referral at this time.    Enzo Montgomery, RN,BSN,CCM [facility] Telephonic Care Management Coordinator Direct Phone: [phone] Toll Free: [phone] Fax: [phone]

## 2020-08-15 ENCOUNTER — Telehealth: Payer: Self-pay | Admitting: Emergency Medicine

## 2020-08-15 DIAGNOSIS — R262 Difficulty in walking, not elsewhere classified: Secondary | ICD-10-CM | POA: Diagnosis not present

## 2020-08-15 DIAGNOSIS — E785 Hyperlipidemia, unspecified: Secondary | ICD-10-CM | POA: Diagnosis not present

## 2020-08-15 DIAGNOSIS — E1151 Type 2 diabetes mellitus with diabetic peripheral angiopathy without gangrene: Secondary | ICD-10-CM | POA: Diagnosis not present

## 2020-08-15 DIAGNOSIS — I2510 Atherosclerotic heart disease of native coronary artery without angina pectoris: Secondary | ICD-10-CM | POA: Diagnosis not present

## 2020-08-15 NOTE — Telephone Encounter (Signed)
Spoke to [employer] and aware of video wound check 08/16/20 at 1000 am. Nursing staff at [facility] will remove steri-strips prior to appointment. For appointment tomorrow contact Heather at [phone] or Kim at [phone].

## 2020-08-15 NOTE — Telephone Encounter (Signed)
Opened in error

## 2020-08-16 ENCOUNTER — Telehealth (INDEPENDENT_AMBULATORY_CARE_PROVIDER_SITE_OTHER): Payer: Medicare HMO | Admitting: Emergency Medicine

## 2020-08-16 ENCOUNTER — Other Ambulatory Visit: Payer: Self-pay

## 2020-08-16 ENCOUNTER — Telehealth: Payer: Self-pay | Admitting: Emergency Medicine

## 2020-08-16 NOTE — Telephone Encounter (Signed)
Spoke with Laurence Aly. Steri-strips have not been removed from wound site . Will move appointment to 1520 this afternoon to allow time to remove dressing.  Contact # for appointment is Luellen Pucker at [phone].

## 2020-08-16 NOTE — Telephone Encounter (Signed)
Contacted both # given for appointment contact at [facility] and given alternate # due to staffing change. [facility] at [phone] to call device clinic. Attempted to contact  and nurse assigned to patient with no success. Will try again later

## 2020-08-16 NOTE — Progress Notes (Signed)
Video visit done with permission of patient. Daughter present and Counselling psychologist at [facility]. Steri-strips removed prior to visit, wound site with edges approximated, bruising present at wound site, small dark area reported as scab at right distal edge of incision. No stitch present per Laurence Aly. No drainage, redness or edema at wound site. Education done on lifting restrictions, and care of incision site. Luellen Pucker RN to send photo of wound site with patient permission. Remote transmission sent successfully .

## 2020-08-17 ENCOUNTER — Other Ambulatory Visit: Payer: Self-pay | Admitting: Cardiothoracic Surgery

## 2020-08-17 ENCOUNTER — Telehealth (HOSPITAL_COMMUNITY): Payer: Self-pay

## 2020-08-17 DIAGNOSIS — Z951 Presence of aortocoronary bypass graft: Secondary | ICD-10-CM

## 2020-08-17 NOTE — Telephone Encounter (Signed)
Per phase I, fax cardiac rehab referral to  cardiac rehab.  

## 2020-08-17 NOTE — Telephone Encounter (Signed)
Per phase I, fax cardiac rehab referral to [facility] cardiac rehab.  

## 2020-08-18 ENCOUNTER — Ambulatory Visit
Admission: RE | Admit: 2020-08-18 | Discharge: 2020-08-18 | Disposition: A | Discharge status: Home / Self Care | Payer: Medicare HMO | Source: Ambulatory Visit | Attending: Cardiothoracic Surgery | Admitting: Cardiothoracic Surgery

## 2020-08-18 ENCOUNTER — Ambulatory Visit (INDEPENDENT_AMBULATORY_CARE_PROVIDER_SITE_OTHER): Payer: Self-pay | Admitting: Cardiothoracic Surgery

## 2020-08-18 ENCOUNTER — Other Ambulatory Visit: Payer: Self-pay

## 2020-08-18 VITALS — BP 112/66 | HR 85 | Temp 97.6°F | Resp 20 | Ht 66.0 in | Wt 165.0 lb

## 2020-08-18 DIAGNOSIS — J9811 Atelectasis: Secondary | ICD-10-CM | POA: Diagnosis not present

## 2020-08-18 DIAGNOSIS — Z951 Presence of aortocoronary bypass graft: Secondary | ICD-10-CM

## 2020-08-18 DIAGNOSIS — I700 Atherosclerosis of aorta: Secondary | ICD-10-CM | POA: Diagnosis not present

## 2020-08-18 DIAGNOSIS — J90 Pleural effusion, not elsewhere classified: Secondary | ICD-10-CM | POA: Diagnosis not present

## 2020-08-18 DIAGNOSIS — I517 Cardiomegaly: Secondary | ICD-10-CM | POA: Diagnosis not present

## 2020-08-24 NOTE — Progress Notes (Signed)
[address]       [place] [address]             [phone]     CARDIOTHORACIC SURGERY OFFICE NOTE  Referring Provider is Charolette Forward, MD Primary Cardiologist is No primary care provider on file. PCP is Ernestene Kiel, MD   HPI:  80 year old Kristin [name] is status post recent urgent CABG.  She had significant debilitation after surgery and was ultimately discharged to a skilled nursing facility.  She arrives now for her first outpatient visit after discharge.  She states she has been progressively improving with her ambulation and other activities.  She denies chest pain or shortness of breath.   Current Outpatient Medications  Medication Sig Dispense Refill  . acetaminophen (TYLENOL) 325 MG tablet Take 2 tablets (650 mg total) by mouth every 6 (six) hours as needed for mild pain or fever.    Marland Kitchen albuterol (VENTOLIN HFA) 108 (90 Base) MCG/ACT inhaler Inhale 2 puffs into the lungs every 6 (six) hours as needed for wheezing or shortness of breath.    . Alcohol Swabs (ALCOHOL PREP) 70 % PADS     . apixaban (ELIQUIS) 5 MG TABS tablet Take 1 tablet (5 mg total) by mouth 2 (two) times daily. 60 tablet   . aspirin 81 MG chewable tablet Chew 1 tablet (81 mg total) by mouth daily.    . ASSURE COMFORT LANCETS 30G MISC     . atorvastatin (LIPITOR) 80 MG tablet Take 1 tablet (80 mg total) by mouth daily at 6 PM. 30 tablet 1  . b complex vitamins tablet Take 1 tablet by mouth daily.    . Blood Glucose Monitoring Suppl (EMBRACE BLOOD GLUCOSE MONITOR) DEVI     . buPROPion (WELLBUTRIN XL) 150 MG 24 hr tablet     . cholecalciferol (VITAMIN D) 1000 UNITS tablet Take 1,000 Units by mouth daily.    . COMFORT EZ PEN NEEDLES 31G X 8 MM MISC     . EMBRACE BLOOD GLUCOSE TEST test strip     . feeding supplement (ENSURE ENLIVE / ENSURE PLUS) LIQD Take 237 mLs by mouth 3 (three) times daily between meals. 237 mL 12  . glimepiride (AMARYL) 1 MG tablet Take 1 mg by mouth every morning.     . insulin detemir (LEVEMIR) 100 UNIT/ML injection Inject 0.24 mLs (24 Units total) into the skin daily. 10 mL 11  . LEVEMIR FLEXTOUCH 100 UNIT/ML FlexPen Inject 42 Units into the skin daily.    Marland Kitchen losartan (COZAAR) 25 MG tablet Take 0.5 tablets (12.5 mg total) by mouth at bedtime. 30 tablet 1  . magic mouthwash SOLN Take 15 mLs by mouth 4 (four) times daily as needed for mouth pain.  0  . melatonin 3 MG TABS tablet Take 1 tablet (3 mg total) by mouth at bedtime.  0  . metoprolol succinate (TOPROL-XL) 25 MG 24 hr tablet Take 1 tablet (25 mg total) by mouth daily. 60 tablet 1  . Multiple Vitamins-Minerals (ZINC PO) Take 1 tablet by mouth daily.    . pioglitazone (ACTOS) 30 MG tablet Take 30 mg by mouth daily.    . polyethylene glycol (MIRALAX / GLYCOLAX) 17 g packet Take 17 g by mouth daily. 14 each 0  . QUEtiapine (SEROQUEL) 50 MG tablet Take 1 tablet (50 mg total) by mouth at bedtime.    Marland Kitchen spironolactone (ALDACTONE) 25 MG tablet Take 0.5 tablets (12.5 mg total) by mouth daily.    Marland Kitchen  topiramate (TOPAMAX) 50 MG tablet Take 50 mg by mouth daily.    Marland Kitchen torsemide (DEMADEX) 20 MG tablet Take 1 tablet (20 mg total) by mouth daily.    . traMADol (ULTRAM) 50 MG tablet Take 1 tablet (50 mg total) by mouth every 6 (six) hours as needed for moderate pain. 28 tablet 0   No current facility-administered medications for this visit.      Physical Exam:   BP 112/66   Pulse 85   Temp 97.6 F (36.4 C) (Skin)   Resp 20   Ht '5\' 6"'$  (1.676 m)   Wt Kristin.8 kg   SpO2 95% Comment: RA  BMI 26.63 kg/m   General:  Elderly Kristin [name] no acute distress  Chest:   Clear to auscultation bilaterally  CV:   Regular rate and rhythm no murmur  Incisions:  Healing well  Abdomen:  Soft nontender  Extremities:  Minimal edema  Diagnostic Tests:  Chest x-ray with clear lung fields and stable mediastinal structures   Impression:  Doing reasonably well after urgent CABG  Plan:  Follow-up with thoracic surgery as  needed Okay for discharge from rehab any time Follow-up with advanced heart failure for additional medication adjustment  I spent in excess of 20 minutes during the conduct of this office consultation and >50% of this time involved direct face-to-face encounter with the patient for counseling and/or coordination of their care.  Level 2                 10 minutes Level 3                 15 minutes Level 4                 25 minutes Level 5                 40 minutes  B.  Murvin Natal, MD 08/24/2020 9:48 AM

## 2020-09-02 DIAGNOSIS — R2689 Other abnormalities of gait and mobility: Secondary | ICD-10-CM | POA: Diagnosis not present

## 2020-09-06 DIAGNOSIS — Z7689 Persons encountering health services in other specified circumstances: Secondary | ICD-10-CM | POA: Diagnosis not present

## 2020-09-06 DIAGNOSIS — Z7901 Long term (current) use of anticoagulants: Secondary | ICD-10-CM | POA: Diagnosis not present

## 2020-09-06 DIAGNOSIS — I2510 Atherosclerotic heart disease of native coronary artery without angina pectoris: Secondary | ICD-10-CM | POA: Diagnosis not present

## 2020-09-06 DIAGNOSIS — E119 Type 2 diabetes mellitus without complications: Secondary | ICD-10-CM | POA: Diagnosis not present

## 2020-09-06 DIAGNOSIS — I10 Essential (primary) hypertension: Secondary | ICD-10-CM | POA: Diagnosis not present

## 2020-09-06 DIAGNOSIS — E785 Hyperlipidemia, unspecified: Secondary | ICD-10-CM | POA: Diagnosis not present

## 2020-09-09 DIAGNOSIS — I10 Essential (primary) hypertension: Secondary | ICD-10-CM | POA: Diagnosis not present

## 2020-09-09 DIAGNOSIS — E119 Type 2 diabetes mellitus without complications: Secondary | ICD-10-CM | POA: Diagnosis not present

## 2020-09-09 DIAGNOSIS — E785 Hyperlipidemia, unspecified: Secondary | ICD-10-CM | POA: Diagnosis not present

## 2020-09-09 DIAGNOSIS — I2510 Atherosclerotic heart disease of native coronary artery without angina pectoris: Secondary | ICD-10-CM | POA: Diagnosis not present

## 2020-09-14 ENCOUNTER — Telehealth: Payer: Self-pay | Admitting: *Deleted

## 2020-09-14 NOTE — Telephone Encounter (Signed)
Kristin [name] contacted the office stating she noticed some pus coming from old chest tube site. Patient is s/p CABG 3.23.22 by [name]. Patient noticed the scab formed over the incision was trying to fall off last week. While she was bathing she noticed "pus" like draining coming from under the scab. Patient states slight redness at incision. Photo of wound sent to office cell phone. Scant redness around incision with large scab over wound. No drainage noted in photo. Appointment made for further evaluation with [name] for tomorrow. Patient aware and verbalizes understanding.

## 2020-09-15 ENCOUNTER — Telehealth (HOSPITAL_COMMUNITY): Payer: Self-pay | Admitting: *Deleted

## 2020-09-15 ENCOUNTER — Ambulatory Visit: Payer: Self-pay | Admitting: Cardiothoracic Surgery

## 2020-09-15 ENCOUNTER — Other Ambulatory Visit: Payer: Self-pay

## 2020-09-15 ENCOUNTER — Encounter: Payer: Self-pay | Admitting: Cardiothoracic Surgery

## 2020-09-15 VITALS — BP 129/55 | HR 87 | Temp 97.9°F | Resp 20 | Wt 165.2 lb

## 2020-09-15 DIAGNOSIS — Z951 Presence of aortocoronary bypass graft: Secondary | ICD-10-CM

## 2020-09-15 NOTE — Telephone Encounter (Signed)
Jana Half with [facility] left VM stating Dr.Atkins would like pt to be seen by Dr.Bensimhon as a hospital f/u.  Routed to [employer], RN for approval and scheduling

## 2020-09-19 NOTE — Progress Notes (Signed)
80 year old lady underwent urgent CABG at the end of March of this year.  She did reasonably well but had a somewhat prolonged hospital course due to fragility.  She was ultimately discharged to a skilled nursing facility.  Now residing with her daughter.  She presents for a wound check of a former chest tube site.  There is no fevers or chills and no chest pain  Physical exam: BP (!) 129/55 (BP Location: Right Arm, Patient Position: Sitting, Cuff Size: Normal)   Pulse 87   Temp 97.9 F (36.6 C) (Skin)   Resp 20   Wt 74.9 kg   SpO2 97% Comment: RA  BMI 26.66 kg/m  Well-appearing no acute distress Clear to auscultation bilaterally Regular rate and rhythm Chest tube site has a dense scab on it.  There is no surrounding erythema.    Impression/plan: The patient is encouraged to apply antibiotic ointment to the scab if she feels like it.  This should then be covered with a Band-Aid.  There is nothing to do specifically about the slow healing chest tube site Follow-up as needed  Meklit Cotta Z. Orvan Seen, [place]

## 2020-09-27 ENCOUNTER — Encounter (HOSPITAL_COMMUNITY): Payer: Medicare HMO | Admitting: Internal Medicine

## 2020-10-05 ENCOUNTER — Other Ambulatory Visit (HOSPITAL_COMMUNITY): Payer: Self-pay

## 2020-10-05 ENCOUNTER — Encounter (HOSPITAL_COMMUNITY): Payer: Self-pay | Admitting: Internal Medicine

## 2020-10-05 ENCOUNTER — Ambulatory Visit (HOSPITAL_COMMUNITY)
Admission: RE | Admit: 2020-10-05 | Discharge: 2020-10-05 | Disposition: A | Discharge status: Home / Self Care | Payer: Medicare HMO | Source: Ambulatory Visit | Attending: Internal Medicine | Admitting: Internal Medicine

## 2020-10-05 ENCOUNTER — Other Ambulatory Visit: Payer: Self-pay

## 2020-10-05 ENCOUNTER — Telehealth (HOSPITAL_COMMUNITY): Payer: Self-pay | Admitting: Licensed Clinical Social Worker

## 2020-10-05 ENCOUNTER — Telehealth (HOSPITAL_COMMUNITY): Payer: Self-pay | Admitting: Pharmacy Technician

## 2020-10-05 VITALS — BP 118/56 | HR 75 | Wt 162.6 lb

## 2020-10-05 DIAGNOSIS — Z794 Long term (current) use of insulin: Secondary | ICD-10-CM | POA: Insufficient documentation

## 2020-10-05 DIAGNOSIS — Z7902 Long term (current) use of antithrombotics/antiplatelets: Secondary | ICD-10-CM | POA: Insufficient documentation

## 2020-10-05 DIAGNOSIS — I130 Hypertensive heart and chronic kidney disease with heart failure and stage 1 through stage 4 chronic kidney disease, or unspecified chronic kidney disease: Secondary | ICD-10-CM | POA: Diagnosis not present

## 2020-10-05 DIAGNOSIS — Z950 Presence of cardiac pacemaker: Secondary | ICD-10-CM | POA: Insufficient documentation

## 2020-10-05 DIAGNOSIS — I480 Paroxysmal atrial fibrillation: Secondary | ICD-10-CM | POA: Insufficient documentation

## 2020-10-05 DIAGNOSIS — Z833 Family history of diabetes mellitus: Secondary | ICD-10-CM | POA: Insufficient documentation

## 2020-10-05 DIAGNOSIS — Z7901 Long term (current) use of anticoagulants: Secondary | ICD-10-CM | POA: Insufficient documentation

## 2020-10-05 DIAGNOSIS — Z7982 Long term (current) use of aspirin: Secondary | ICD-10-CM | POA: Diagnosis not present

## 2020-10-05 DIAGNOSIS — N1832 Chronic kidney disease, stage 3b: Secondary | ICD-10-CM | POA: Insufficient documentation

## 2020-10-05 DIAGNOSIS — I5022 Chronic systolic (congestive) heart failure: Secondary | ICD-10-CM

## 2020-10-05 DIAGNOSIS — Z79899 Other long term (current) drug therapy: Secondary | ICD-10-CM | POA: Insufficient documentation

## 2020-10-05 DIAGNOSIS — I2510 Atherosclerotic heart disease of native coronary artery without angina pectoris: Secondary | ICD-10-CM | POA: Insufficient documentation

## 2020-10-05 DIAGNOSIS — Z8249 Family history of ischemic heart disease and other diseases of the circulatory system: Secondary | ICD-10-CM | POA: Insufficient documentation

## 2020-10-05 DIAGNOSIS — Z951 Presence of aortocoronary bypass graft: Secondary | ICD-10-CM

## 2020-10-05 DIAGNOSIS — I252 Old myocardial infarction: Secondary | ICD-10-CM | POA: Diagnosis not present

## 2020-10-05 DIAGNOSIS — I255 Ischemic cardiomyopathy: Secondary | ICD-10-CM | POA: Insufficient documentation

## 2020-10-05 DIAGNOSIS — E1122 Type 2 diabetes mellitus with diabetic chronic kidney disease: Secondary | ICD-10-CM | POA: Diagnosis not present

## 2020-10-05 LAB — BASIC METABOLIC PANEL
Anion gap: 9 (ref 5–15)
BUN: 28 mg/dL — ABNORMAL HIGH (ref 8–23)
CO2: 29 mmol/L (ref 22–32)
Calcium: 9.6 mg/dL (ref 8.9–10.3)
Chloride: 101 mmol/L (ref 98–111)
Creatinine, Ser: 1.7 mg/dL — ABNORMAL HIGH (ref 0.44–1.00)
GFR, Estimated: 30 mL/min — ABNORMAL LOW (ref >60)
Glucose, Bld: 127 mg/dL — ABNORMAL HIGH (ref 70–99)
Potassium: 3.7 mmol/L (ref 3.5–5.1)
Sodium: 139 mmol/L (ref 135–145)

## 2020-10-05 LAB — CBC
HCT: 38.2 % (ref 36.0–46.0)
Hemoglobin: 11.9 g/dL — ABNORMAL LOW (ref 12.0–15.0)
MCH: 30.5 pg (ref 26.0–34.0)
MCHC: 31.2 g/dL (ref 30.0–36.0)
MCV: 97.9 fL (ref 80.0–100.0)
Platelets: 244 K/uL (ref 150–400)
RBC: 3.9 MIL/uL (ref 3.87–5.11)
RDW: 13.8 % (ref 11.5–15.5)
WBC: 7.1 K/uL (ref 4.0–10.5)
nRBC: 0 % (ref 0.0–0.2)

## 2020-10-05 LAB — BRAIN NATRIURETIC PEPTIDE: B Natriuretic Peptide: 149.1 pg/mL — ABNORMAL HIGH (ref 0.0–100.0)

## 2020-10-05 MED ORDER — EMPAGLIFLOZIN 10 MG PO TABS: 10.0000 mg | ORAL_TABLET | Freq: Every day | ORAL | 11 refills | Status: DC

## 2020-10-05 MED ORDER — TORSEMIDE 20 MG PO TABS: 20.0000 mg | ORAL_TABLET | Freq: Every day | ORAL | 3 refills | Status: DC | PRN

## 2020-10-05 NOTE — Telephone Encounter (Signed)
Advanced Heart Failure Patient Advocate Encounter  Patient was seen in clinic today and started on Jardiance. The current 30 day co-pay is $45, which is a barrier. Started an application for [employer].   Will fax in once signatures are obtained.  Spoke with the patient regarding Eliquis assistance as well. Advised her when she spends 3% OOP (about $500) that she would qualify for that as well. Advised her to check with the pharmacy on how much has been spent this year.

## 2020-10-05 NOTE — Telephone Encounter (Signed)
CSW consulted by patient advocate to assist pt with [employer] application as she is process of applying for Jardiance assistance and will likely require a denial from [employer] before they move forward with determination.  Called pt and informed of above- we will plan to complete application tomorrow   Jorge Ny, [facility] Desk#: [phone] Cell#: [phone]

## 2020-10-05 NOTE — Progress Notes (Signed)
ReDS Vest / Clip - 10/05/20 1400      ReDS Vest / Clip   Station Marker C    Ruler Value 27    ReDS Value Range Low volume    ReDS Actual Value 31

## 2020-10-05 NOTE — Addendum Note (Signed)
Encounter addended by: Jolaine Artist, MD on: 10/05/2020 5:39 PM  Actions taken: Level of Service modified, Visit diagnoses modified

## 2020-10-05 NOTE — Progress Notes (Signed)
ADVANCED HF CLINIC CONSULT NOTE  Referring Physician: Dr. Fredrich Romans Primary Care:Prochnau, Chrys Racer Primary Cardiologist: Charolette Forward, Ardra Kuznicki, Quillian Quince  HPI:  Ursela, Morian is a 80 yo female with PMH significant for  T2DM, HTN, Depression, recently diagnosed CAD s/p emergent CABG 2/2 NSTEMI, Systolic CHF, ischemic cardiomyopathy, CHB s/p CRT-P, post op PAF.    No prior cardiac history until 07/20/20 when she presented with chest pain radiating to left arm and shortness of breath, she was in NSR with LBBB, troponin 3,200->3,300 consistent with NSTEMI.  She had a very complicated hospital course.  Her 07/20/20 ECHO with EF 40-45% and RWMA. Started on IV heparin, She underwent catheterization on 3/24 and was found to have multivessel CAD with LM involvement.  She required placement of an IABP.  TCTS consulted and agreed she would require emergent coronary bypass grafting. 3/24 underwent emergent CABG x 5 (LIMA to LAD, SEQ SVG to OM and Diagonal, SEQ to PL and PDA).  After surgery patient persistently hypotensive despite inotropic and vasopressor support.  07/22/20 TEE performed and consistent with tamponade.  Taken back to OR for tamponade. EF on TEE 55%, RV mildly down (3/25).  Post takeback still somewhat hypotensive required continued milrinone support.  Difficulty with adding GDMT milrinone stopped and midodrine added 07/25/20.  She also developed post op CHB and was being paced by epicardial wires with no improvement in conduction therefore CRT-P was placed on 07/27/20.  Additionally she developed pleural effusion requiring left sided thoracentesis 08/02/20 and started on cefepime for possible pna. Repeat ECHO 08/02/20 with EF 40-45%, moderate TR, G1DD, Inferior basal septal distal anterior wall hypokinesis.  On 4/8 several PAF episodes noted and started on eliquis.  Required repeat Thoracentesis 4/11.  BP and respiratory status slowly improved and was able to wean off midodrine and GDMT was  titrated/diuretics adjusted.  She remained very deconditioned and ultimately was discharged to SNF for further rehab.  Discharged on Apixaban 5 bid, ECASA 81 daily, Losartan 12.5 qhs, Spiro 12.5 daily, Torsemide 20 daily, Atorva 80 daily, Toprol 25.     Last seen by [name] 08/18/20 doing well at that time, no changes made.  Out of SNF May 6th, currently living with her daughter, here with her neighbor today.  Still reports feeling weak and unsteady on her feet.  Breathing has been doing well, no chest pain, orthopnea or PND.  Mostly limited by feeling weak and unsteady with walking.  Denies dizziness, light headedness.  Doing exercises but no longer working with PT.      Review of Systems: [y] = yes, '[ ]'$  = no   General: Weight gain '[ ]'$ ; Weight loss '[ ]'$ ; Anorexia '[ ]'$ ; Fatigue [ y]; Fever '[ ]'$ ; Chills '[ ]'$ ; Weakness Blue.Reese ]  Cardiac: Chest pain/pressure '[ ]'$ ; Resting SOB '[ ]'$ ; Exertional SOB '[ ]'$ ; Orthopnea '[ ]'$ ; Pedal Edema '[ ]'$ ; Palpitations '[ ]'$ ; Syncope '[ ]'$ ; Presyncope '[ ]'$ ; Paroxysmal nocturnal dyspnea'[ ]'$   Pulmonary: Cough '[ ]'$ ; Wheezing'[ ]'$ ; Hemoptysis'[ ]'$ ; Sputum '[ ]'$ ; Snoring '[ ]'$   GI: Vomiting'[ ]'$ ; Dysphagia'[ ]'$ ; Melena'[ ]'$ ; Hematochezia '[ ]'$ ; Heartburn'[ ]'$ ; Abdominal pain '[ ]'$ ; Constipation '[ ]'$ ; Diarrhea '[ ]'$ ; BRBPR '[ ]'$   GU: Hematuria'[ ]'$ ; Dysuria '[ ]'$ ; Nocturia'[ ]'$   Vascular: Pain in legs with walking '[ ]'$ ; Pain in feet with lying flat '[ ]'$ ; Non-healing sores '[ ]'$ ; Stroke '[ ]'$ ; TIA '[ ]'$ ; Slurred speech '[ ]'$ ;  Neuro: Headaches'[ ]'$ ; Vertigo'[ ]'$ ; Seizures'[ ]'$ ; Paresthesias'[ ]'$ ;Blurred vision '[ ]'$ ;  Diplopia '[ ]'$ ; Vision changes '[ ]'$   Ortho/Skin: Arthritis Blue.Reese ]; Joint pain '[ ]'$ ; Muscle pain '[ ]'$ ; Joint swelling '[ ]'$ ; Back Pain '[ ]'$ ; Rash '[ ]'$   Psych: Depression[y ]; Anxiety[y ]  Heme: Bleeding problems '[ ]'$ ; Clotting disorders '[ ]'$ ; Anemia '[ ]'$   Endocrine: Diabetes [ y]; Thyroid dysfunction'[ ]'$    Past Medical History:  Diagnosis Date  . Depression   . Diabetes mellitus without complication ([place])   . Hypertension   . Night sweats      Current Outpatient Medications  Medication Sig Dispense Refill  . acetaminophen (TYLENOL) 325 MG tablet Take 2 tablets (650 mg total) by mouth every 6 (six) hours as needed for mild pain or fever.    . Alcohol Swabs (ALCOHOL PREP) 70 % PADS     . apixaban (ELIQUIS) 5 MG TABS tablet Take 1 tablet (5 mg total) by mouth 2 (two) times daily. 60 tablet   . aspirin 81 MG chewable tablet Chew 1 tablet (81 mg total) by mouth daily.    . ASSURE COMFORT LANCETS 30G MISC     . atorvastatin (LIPITOR) 80 MG tablet Take 1 tablet (80 mg total) by mouth daily at 6 PM. 30 tablet 1  . b complex vitamins tablet Take 1 tablet by mouth daily.    . Blood Glucose Monitoring Suppl (EMBRACE BLOOD GLUCOSE MONITOR) DEVI     . buPROPion (WELLBUTRIN XL) 150 MG 24 hr tablet     . cholecalciferol (VITAMIN D) 1000 UNITS tablet Take 1,000 Units by mouth daily.    . COMFORT EZ PEN NEEDLES 31G X 8 MM MISC     . EMBRACE BLOOD GLUCOSE TEST test strip     . feeding supplement (ENSURE ENLIVE / ENSURE PLUS) LIQD Take 237 mLs by mouth 3 (three) times daily between meals. 237 mL 12  . glimepiride (AMARYL) 1 MG tablet Take 1 mg by mouth every morning.    . insulin detemir (LEVEMIR) 100 UNIT/ML injection Inject 0.24 mLs (24 Units total) into the skin daily. 10 mL 11  . LEVEMIR FLEXTOUCH 100 UNIT/ML FlexPen Inject 42 Units into the skin daily.    Marland Kitchen losartan (COZAAR) 25 MG tablet Take 0.5 tablets (12.5 mg total) by mouth at bedtime. 30 tablet 1  . magic mouthwash SOLN Take 15 mLs by mouth 4 (four) times daily as needed for mouth pain.  0  . melatonin 3 MG TABS tablet Take 1 tablet (3 mg total) by mouth at bedtime.  0  . metoprolol succinate (TOPROL-XL) 25 MG 24 hr tablet Take 1 tablet (25 mg total) by mouth daily. 60 tablet 1  . Multiple Vitamins-Minerals (ZINC PO) Take 1 tablet by mouth daily.    . pioglitazone (ACTOS) 30 MG tablet Take 30 mg by mouth daily.    . polyethylene glycol (MIRALAX / GLYCOLAX) 17 g packet Take 17 g by  mouth daily. 14 each 0  . QUEtiapine (SEROQUEL) 50 MG tablet Take 1 tablet (50 mg total) by mouth at bedtime.    Marland Kitchen spironolactone (ALDACTONE) 25 MG tablet Take 0.5 tablets (12.5 mg total) by mouth daily.    Marland Kitchen topiramate (TOPAMAX) 50 MG tablet Take 50 mg by mouth daily.    Marland Kitchen torsemide (DEMADEX) 20 MG tablet Take 1 tablet (20 mg total) by mouth daily.    . traMADol (ULTRAM) 50 MG tablet Take 1 tablet (50 mg total) by mouth every 6 (six) hours as needed for moderate pain. 28 tablet 0  No current facility-administered medications for this visit.    Allergies  Allergen Reactions  . Sulfa Antibiotics Hives      Social History   Socioeconomic History  . Marital status: Widowed    Spouse name: Not on file  . Number of children: Not on file  . Years of education: Not on file  . Highest education level: Not on file  Occupational History  . Occupation: retired   Tobacco Use  . Smoking status: Never Smoker  . Smokeless tobacco: Never Used  Vaping Use  . Vaping Use: Never used  Substance and Sexual Activity  . Alcohol use: No  . Drug use: No  . Sexual activity: Not on file  Other Topics Concern  . Not on file  Social History Narrative  . Not on file   Social Determinants of Health   Financial Resource Strain: Low Risk   . Difficulty of Paying Living Expenses: Not very hard  Food Insecurity: Not on file  Transportation Needs: No Transportation Needs  . Lack of Transportation (Medical): No  . Lack of Transportation (Non-Medical): No  Physical Activity: Not on file  Stress: Not on file  Social Connections: Not on file  Intimate Partner Violence: Not on file      Family History  Problem Relation Age of Onset  . Diabetes Mother   . Heart attack Mother   . Heart attack Father     There were no vitals filed for this visit.  PHYSICAL EXAM: General:  Well appearing. No respiratory difficulty HEENT: normal Neck: supple. no JVD. Carotids 2+ bilat; no bruits. No  lymphadenopathy or thryomegaly appreciated. Cor: Surgical site looks good, PMI nondisplaced. Regular rate & rhythm. No rubs, gallops or murmurs. Lungs: clear Abdomen: soft, nontender, nondistended. No hepatosplenomegaly. No bruits or masses. Good bowel sounds. Extremities: no cyanosis, clubbing, rash, mild LLE edema Neuro: alert & oriented x 3, cranial nerves grossly intact. moves all 4 extremities w/o difficulty. Affect pleasant.  ECG:  Atrial sense BIV pacing rate 74 underlying rhythm NSR, QRS 166m  ASSESSMENT & PLAN:  1. Chronic Systolic CHF, Ischemic Cardiomyopathy -S/p CABG x5 3/24 with take back for tamponade on 3/25 - LVEF 55% on post-op TEE 3/25-->40-45% on TTE 08/01/20 - NYHA class II mostly limited by leg weakness, euvolemic on exam REDS 31% - continue spiro 12.5 - continue losartan 12.5 qhs  - continue toprol 25 - Add jardiance 10, and switch torsemide to '20mg'$  PRN - Needs cardiac rehab - needs repeat echo at follow up visit  2. CAD s/p NSTEMI  - CABG x 5 on 3/24/22as detailed above -No s/s ischemia -Continue ASA and apixaban; plavix stopped due to need for eliquis - Continue statin - continue toprol 25  4. CKD 3b -prior baseline cr 1.1 secondary to shock/ATN, before d/c 07/2020 was around 1.6 -repeat bmp  5. CHB - S/P CRT-P-- 07/27/20 - unable to interrogate device today however ecg with atrial sensing and BIV pacing functioning normally QRS 154  6. PAF - Had several recurrent episodes of Afib on the monitor on 4/7 and 4/9 -Continue apixaban '5mg'$  BID  -In NSR today, no bleeding  7. T2DM -reports good glycemic control -stop actos and start jardiance 10  BVickki Muff MD  Patient seen and examined with the above-signed Advanced Practice Provider and/or Housestaff. I personally reviewed laboratory data, imaging studies and relevant notes. I independently examined the patient and formulated the important aspects of the plan. I have edited the note to  reflect any of my changes or salient points. I have personally discussed the plan with the patient and/or family.  Continues to make slow progress after high-risk CABG and prolonged hospitalization. Was in SNF for 3 weeks. No anginal symptoms. Volume status looks good. Has a lot of anxiety.  General:  Sitting in chair . No resp difficulty HEENT: normal Neck: supple. no JVD. Carotids 2+ bilat; no bruits. No lymphadenopathy or thryomegaly appreciated. Cor: PMI nondisplaced. Sternal wound well healed Regular rate & rhythm. No rubs, gallops or murmurs. Lungs: clear Abdomen: soft, nontender, nondistended. No hepatosplenomegaly. No bruits or masses. Good bowel sounds. Extremities: no cyanosis, clubbing, rash, edema Neuro: alert & orientedx3, cranial nerves grossly intact. moves all 4 extremities w/o difficulty. Affect pleasant  NYHA III mostly due to debility. Making slow but steady improvement. Seems ready for CR. Will refer. Agree with med changes as above. Will schedule repeat echo at next visit.   [name], MD  5:39 PM

## 2020-10-05 NOTE — Patient Instructions (Addendum)
Labs done today. We will contact you only if your labs are abnormal.  START Jardiance '10mg'$  (1 tablet) by mouth daily.   STOP taking Actos  Only take torsemide as needed   No other medication changes were made. Please continue all current medications as prescribed.  You have been referred to Cardiac Rehab at [facility]. They will contact you to schedule an appointment.   Your physician recommends that you schedule a follow-up appointment in: 3 months with Dr. Vaughan Browner  If you have any questions or concerns before your next appointment please send [place] a message through [facility] or call our office at [phone].    TO LEAVE A MESSAGE FOR THE NURSE SELECT OPTION 2, PLEASE LEAVE A MESSAGE INCLUDING: . YOUR NAME . DATE OF BIRTH . CALL BACK NUMBER . REASON FOR CALL**this is important as we prioritize the call backs  YOU WILL RECEIVE A CALL BACK THE SAME DAY AS LONG AS YOU CALL BEFORE 4:00 PM   Do the following things EVERYDAY: 1) Weigh yourself in the morning before breakfast. Write it down and keep it in a log. 2) Take your medicines as prescribed 3) Eat low salt foods--Limit salt (sodium) to 2000 mg per day.  4) Stay as active as you can everyday 5) Limit all fluids for the day to less than 2 liters   At the [facility], you and your health needs are our priority. As part of our continuing mission to provide you with exceptional heart care, we have created designated Provider Care Teams. These Care Teams include your primary Cardiologist (physician) and Advanced Practice Providers (APPs- Physician Assistants and Nurse Practitioners) who all work together to provide you with the care you need, when you need it.   You may see any of the following providers on your designated Care Team at your next follow up: Marland Kitchen Dr [name] . Dr Loralie Champagne . Darrick Grinder, NP . Lyda Jester, PA . Audry Riles, PharmD   Please be sure to bring in all your medications  bottles to every appointment.

## 2020-10-06 ENCOUNTER — Telehealth (HOSPITAL_COMMUNITY): Payer: Self-pay | Admitting: Licensed Clinical Social Worker

## 2020-10-06 NOTE — Telephone Encounter (Signed)
CSW attempted to call pt x3 to help complete Extra Help program- unable to reach or leave VM  Will continue to attempt  Kristin [name], [facility] Desk#: [phone] Cell#: [phone]

## 2020-10-07 ENCOUNTER — Telehealth (HOSPITAL_COMMUNITY): Payer: Self-pay | Admitting: Licensed Clinical Social Worker

## 2020-10-07 DIAGNOSIS — Z1329 Encounter for screening for other suspected endocrine disorder: Secondary | ICD-10-CM | POA: Diagnosis not present

## 2020-10-07 DIAGNOSIS — I10 Essential (primary) hypertension: Secondary | ICD-10-CM | POA: Diagnosis not present

## 2020-10-07 DIAGNOSIS — Z7901 Long term (current) use of anticoagulants: Secondary | ICD-10-CM | POA: Diagnosis not present

## 2020-10-07 DIAGNOSIS — Z1321 Encounter for screening for nutritional disorder: Secondary | ICD-10-CM | POA: Diagnosis not present

## 2020-10-07 NOTE — Telephone Encounter (Signed)
Sent in application via fax.  Will follow up.  

## 2020-10-07 NOTE — Telephone Encounter (Signed)
CSW attempted to call pt to help complete Extra Help application- unable to reach- unable to leave VM  Jorge Ny, [facility] Desk#: [phone] Cell#: [phone]

## 2020-10-17 ENCOUNTER — Other Ambulatory Visit (HOSPITAL_COMMUNITY): Payer: Self-pay | Admitting: *Deleted

## 2020-10-17 ENCOUNTER — Other Ambulatory Visit: Payer: Self-pay

## 2020-10-17 ENCOUNTER — Ambulatory Visit (HOSPITAL_COMMUNITY)
Admission: RE | Admit: 2020-10-17 | Discharge: 2020-10-17 | Disposition: A | Discharge status: Home / Self Care | Payer: Medicare HMO | Source: Ambulatory Visit | Attending: Internal Medicine | Admitting: Internal Medicine

## 2020-10-17 DIAGNOSIS — I5022 Chronic systolic (congestive) heart failure: Secondary | ICD-10-CM | POA: Insufficient documentation

## 2020-10-17 LAB — BASIC METABOLIC PANEL
Anion gap: 8 (ref 5–15)
BUN: 29 mg/dL — ABNORMAL HIGH (ref 8–23)
CO2: 29 mmol/L (ref 22–32)
Calcium: 9.2 mg/dL (ref 8.9–10.3)
Chloride: 96 mmol/L — ABNORMAL LOW (ref 98–111)
Creatinine, Ser: 1.82 mg/dL — ABNORMAL HIGH (ref 0.44–1.00)
GFR, Estimated: 28 mL/min — ABNORMAL LOW (ref >60)
Glucose, Bld: 443 mg/dL — ABNORMAL HIGH (ref 70–99)
Potassium: 3.9 mmol/L (ref 3.5–5.1)
Sodium: 133 mmol/L — ABNORMAL LOW (ref 135–145)

## 2020-10-19 ENCOUNTER — Telehealth (HOSPITAL_COMMUNITY): Payer: Self-pay | Admitting: *Deleted

## 2020-10-19 ENCOUNTER — Telehealth: Payer: Self-pay | Admitting: Cardiology

## 2020-10-19 NOTE — Telephone Encounter (Signed)
   Jolaine Artist, MD  10/17/2020  7:28 PM EDT Back to Top     Glucose markedly elevated  o/w ok.    Marsh Dolly Vincent, RN  10/17/2020  5:21 PM EDT      Attempted to call pt regarding glucose, Left message to call back, Dr Bensimhonis aware  Spoke with pt this AM regarding glucose. She states on Sunday night she had a cook-out with homemade ice cream and she ate quite a bit. She states today her glucose was 196 and she took her insulin as prescribed. She will continue to monitor and contact PCP if it continuously runs high. Pt also complain of pain on L side, she states it feels like when she has had diverticulitis in the past and she feels that is what's happening now, advised to f/u with PCP or urgent care as needed, she is agreeable.

## 2020-10-19 NOTE — Telephone Encounter (Signed)
Attempted to call patient back at [phone], call would not go through. Attempted temporary number listed and was unable to leave a voicemail.  I do not see any monitor results in her chart.  She is a patient at HF clinic

## 2020-10-19 NOTE — Telephone Encounter (Signed)
Pt has not had a heart monitor from our office, I called pt at [phone] to discuss, she states she has not had any heart monitor and she did not call any offices today with any type of questions. Apologized for miscommunication and advised if she had any issues/concerns/questions.

## 2020-10-19 NOTE — Telephone Encounter (Signed)
Patient is requesting results from the heart monitor she wore back in May.

## 2020-10-21 ENCOUNTER — Other Ambulatory Visit (HOSPITAL_COMMUNITY): Payer: Self-pay

## 2020-10-21 DIAGNOSIS — N390 Urinary tract infection, site not specified: Secondary | ICD-10-CM | POA: Diagnosis not present

## 2020-10-21 DIAGNOSIS — K5792 Diverticulitis of intestine, part unspecified, without perforation or abscess without bleeding: Secondary | ICD-10-CM | POA: Diagnosis not present

## 2020-10-21 NOTE — Telephone Encounter (Signed)
Advanced Heart Failure Patient Advocate Encounter  [employer] would like for the patient to apply for and be denied LIS before we can get an approval.  Called and spoke with the patient, submitted LIS application. The patient is aware that the determination letter will be mailed to her. She will call and let me know the results. If denied, will send denial to [place] in order to get approval. If approved, patient's co-pay will significantly decrease.  Will mail patient BMS application for Eliquis assistance. She is aware that she will have to include POI and OOP report from the pharmacy to receive an approval.  Charlann Boxer, CPhT

## 2020-10-27 ENCOUNTER — Ambulatory Visit (INDEPENDENT_AMBULATORY_CARE_PROVIDER_SITE_OTHER): Payer: Medicare HMO

## 2020-10-27 DIAGNOSIS — I480 Paroxysmal atrial fibrillation: Secondary | ICD-10-CM

## 2020-10-27 LAB — CUP PACEART REMOTE DEVICE CHECK
Battery Remaining Longevity: 31 mo
Battery Remaining Percentage: 91 %
Battery Voltage: 2.96 V
Brady Statistic AP VP Percent: 1 %
Brady Statistic AP VS Percent: 1 %
Brady Statistic AS VP Percent: 99 %
Brady Statistic AS VS Percent: 1 %
Brady Statistic RA Percent Paced: 1 %
Date Time Interrogation Session: 20220630043212
Implantable Lead Implant Date: 20220330
Implantable Lead Implant Date: 20220330
Implantable Lead Implant Date: 20220330
Implantable Lead Location: 753858
Implantable Lead Location: 753859
Implantable Lead Location: 753860
Implantable Pulse Generator Implant Date: 20220330
Lead Channel Impedance Value: 350 Ohm
Lead Channel Impedance Value: 440 Ohm
Lead Channel Impedance Value: 630 Ohm
Lead Channel Pacing Threshold Amplitude: 0.5 V
Lead Channel Pacing Threshold Amplitude: 0.75 V
Lead Channel Pacing Threshold Amplitude: 1.25 V
Lead Channel Pacing Threshold Pulse Width: 0.5 ms
Lead Channel Pacing Threshold Pulse Width: 0.5 ms
Lead Channel Pacing Threshold Pulse Width: 0.7 ms
Lead Channel Sensing Intrinsic Amplitude: 12 mV
Lead Channel Sensing Intrinsic Amplitude: 3.7 mV
Lead Channel Setting Pacing Amplitude: 3.5 V
Lead Channel Setting Pacing Amplitude: 3.5 V
Lead Channel Setting Pacing Amplitude: 5 V
Lead Channel Setting Pacing Pulse Width: 0.5 ms
Lead Channel Setting Pacing Pulse Width: 0.7 ms
Lead Channel Setting Sensing Sensitivity: 4 mV
Pulse Gen Model: 3562
Pulse Gen Serial Number: 3862449

## 2020-11-03 DIAGNOSIS — N183 Chronic kidney disease, stage 3 unspecified: Secondary | ICD-10-CM | POA: Diagnosis not present

## 2020-11-03 DIAGNOSIS — Z955 Presence of coronary angioplasty implant and graft: Secondary | ICD-10-CM | POA: Diagnosis not present

## 2020-11-03 DIAGNOSIS — Z951 Presence of aortocoronary bypass graft: Secondary | ICD-10-CM | POA: Diagnosis not present

## 2020-11-03 DIAGNOSIS — I129 Hypertensive chronic kidney disease with stage 1 through stage 4 chronic kidney disease, or unspecified chronic kidney disease: Secondary | ICD-10-CM | POA: Diagnosis not present

## 2020-11-03 DIAGNOSIS — E1122 Type 2 diabetes mellitus with diabetic chronic kidney disease: Secondary | ICD-10-CM | POA: Diagnosis not present

## 2020-11-03 DIAGNOSIS — I252 Old myocardial infarction: Secondary | ICD-10-CM | POA: Diagnosis not present

## 2020-11-04 DIAGNOSIS — Z951 Presence of aortocoronary bypass graft: Secondary | ICD-10-CM | POA: Diagnosis not present

## 2020-11-04 DIAGNOSIS — E1122 Type 2 diabetes mellitus with diabetic chronic kidney disease: Secondary | ICD-10-CM | POA: Diagnosis not present

## 2020-11-04 DIAGNOSIS — Z955 Presence of coronary angioplasty implant and graft: Secondary | ICD-10-CM | POA: Diagnosis not present

## 2020-11-04 DIAGNOSIS — N183 Chronic kidney disease, stage 3 unspecified: Secondary | ICD-10-CM | POA: Diagnosis not present

## 2020-11-04 DIAGNOSIS — I129 Hypertensive chronic kidney disease with stage 1 through stage 4 chronic kidney disease, or unspecified chronic kidney disease: Secondary | ICD-10-CM | POA: Diagnosis not present

## 2020-11-04 DIAGNOSIS — I252 Old myocardial infarction: Secondary | ICD-10-CM | POA: Diagnosis not present

## 2020-11-04 DIAGNOSIS — K5792 Diverticulitis of intestine, part unspecified, without perforation or abscess without bleeding: Secondary | ICD-10-CM | POA: Diagnosis not present

## 2020-11-08 DIAGNOSIS — E119 Type 2 diabetes mellitus without complications: Secondary | ICD-10-CM | POA: Diagnosis not present

## 2020-11-08 DIAGNOSIS — I10 Essential (primary) hypertension: Secondary | ICD-10-CM | POA: Diagnosis not present

## 2020-11-08 DIAGNOSIS — I2510 Atherosclerotic heart disease of native coronary artery without angina pectoris: Secondary | ICD-10-CM | POA: Diagnosis not present

## 2020-11-08 DIAGNOSIS — E785 Hyperlipidemia, unspecified: Secondary | ICD-10-CM | POA: Diagnosis not present

## 2020-11-14 DIAGNOSIS — N281 Cyst of kidney, acquired: Secondary | ICD-10-CM | POA: Diagnosis not present

## 2020-11-14 DIAGNOSIS — N259 Disorder resulting from impaired renal tubular function, unspecified: Secondary | ICD-10-CM | POA: Diagnosis not present

## 2020-11-14 DIAGNOSIS — N2889 Other specified disorders of kidney and ureter: Secondary | ICD-10-CM | POA: Diagnosis not present

## 2020-11-15 ENCOUNTER — Encounter: Payer: Medicare HMO | Admitting: Cardiology

## 2020-11-16 NOTE — Progress Notes (Signed)
Remote pacemaker transmission.   

## 2020-11-19 NOTE — Progress Notes (Signed)
Electrophysiology Office Note   Date:  11/21/2020   ID:  Hayliegh, Rudzinski [birth year], MRN [id number]  PCP:  Ernestene Kiel, MD  Cardiologist:  Jennings Books Primary Electrophysiologist:  Chai Verdejo Meredith Leeds, MD    Chief Complaint: pacemaker   History of Present Illness: Kristin [name] is a 80 y.o. female who is being seen today for the evaluation of pacemaker at the request of Ernestene Kiel, MD. Presenting today for electrophysiology evaluation.  She has a history significant for diabetes, obesity, hypertension.  She was admitted to the hospital with non-STEMI, found to have left main stenosis and multivessel coronary artery disease.  She had emergent CABG 07/22/2020 x 5.  Postoperatively, she had complete heart block and is now status post Abbott CRT-P implanted 07/27/2020.  Today, she denies symptoms of palpitations, chest pain, shortness of breath, orthopnea, PND, lower extremity edema, claudication, dizziness, presyncope, syncope, bleeding, or neurologic sequela. The patient is tolerating medications without difficulties.  Since her TAVR CRT-P, she has felt much improved.  She has quite a bit less shortness of breath.  She is able to do most of her daily activities without overall restriction.   Past Medical History:  Diagnosis Date   Depression    Diabetes mellitus without complication ([place])    Hypertension    Night sweats    Past Surgical History:  Procedure Laterality Date   BIV PACEMAKER INSERTION CRT-P N/A 07/27/2020   Procedure: BIV PACEMAKER INSERTION CRT-P;  Surgeon: Constance Haw, MD;  Location: [facility];  Service: Cardiovascular;  Laterality: N/A;   CORONARY ARTERY BYPASS GRAFT N/A 07/21/2020   Procedure: CORONARY ARTERY BYPASS GRAFTING (CABG), TIMES FIVE, USING LEFT INTERNAL MAMMARY ARTERY AND ENDOSCOPICALLY HARVESTED BILATERAL GREATER SAPHENOUS VEINS;  Surgeon: [name], MD;  Location: [facility];  Service: Open Heart Surgery;   Laterality: N/A;   ENDOVEIN HARVEST OF GREATER SAPHENOUS VEIN N/A 07/21/2020   Procedure: ENDOVEIN HARVEST OF BILATERAL GREATER SAPHENOUS VEINS;  Surgeon: [name], MD;  Location: [facility];  Service: Open Heart Surgery;  Laterality: N/A;   EYE SURGERY     GALLBLADDER SURGERY     IABP INSERTION N/A 07/21/2020   Procedure: IABP Insertion;  Surgeon: Charolette Forward, MD;  Location: [facility];  Service: Cardiovascular;  Laterality: N/A;   IR THORACENTESIS ASP PLEURAL SPACE W/IMG GUIDE  08/02/2020   IR THORACENTESIS ASP PLEURAL SPACE W/IMG GUIDE  08/08/2020   LEFT HEART CATH AND CORONARY ANGIOGRAPHY N/A 07/21/2020   Procedure: LEFT HEART CATH AND CORONARY ANGIOGRAPHY;  Surgeon: Charolette Forward, MD;  Location: [facility];  Service: Cardiovascular;  Laterality: N/A;   MEDIASTINAL EXPLORATION N/A 07/22/2020   Procedure: MEDIASTINAL EXPLORATION;  Surgeon: [name], MD;  Location: [facility];  Service: Thoracic;  Laterality: N/A;   TEE WITHOUT CARDIOVERSION N/A 07/21/2020   Procedure: TRANSESOPHAGEAL ECHOCARDIOGRAM (TEE);  Surgeon: [name], MD;  Location: [facility];  Service: Open Heart Surgery;  Laterality: N/A;   TEE WITHOUT CARDIOVERSION N/A 07/22/2020   Procedure: TRANSESOPHAGEAL ECHOCARDIOGRAM (TEE);  Surgeon: [name], MD;  Location: [facility];  Service: Thoracic;  Laterality: N/A;   toenail surgery     both big toenails     Current Outpatient Medications  Medication Sig Dispense Refill   acetaminophen (TYLENOL) 325 MG tablet Take 2 tablets (650 mg total) by mouth every 6 (six) hours as needed for mild pain or fever.     apixaban (ELIQUIS) 5 MG  TABS tablet Take 1 tablet (5 mg total) by mouth 2 (two) times daily. 60 tablet    aspirin 81 MG chewable tablet Chew 1 tablet (81 mg total) by mouth daily.     b complex vitamins tablet Take 1 tablet by mouth daily.     buPROPion (WELLBUTRIN XL) 150 MG 24 hr tablet      cholecalciferol (VITAMIN D) 1000 UNITS tablet Take 1,000  Units by mouth daily.     empagliflozin (JARDIANCE) 10 MG TABS tablet Take 1 tablet (10 mg total) by mouth daily before breakfast. 30 tablet 11   glimepiride (AMARYL) 1 MG tablet Take 1 mg by mouth every morning.     insulin detemir (LEVEMIR) 100 UNIT/ML injection Inject 0.24 mLs (24 Units total) into the skin daily. 10 mL 11   insulin detemir (LEVEMIR) 100 UNIT/ML injection Inject 10 Units into the skin at bedtime.     losartan (COZAAR) 25 MG tablet Take 0.5 tablets (12.5 mg total) by mouth at bedtime. 30 tablet 1   metoprolol succinate (TOPROL-XL) 25 MG 24 hr tablet Take 1 tablet (25 mg total) by mouth daily. 60 tablet 1   Multiple Vitamins-Minerals (ZINC PO) Take 1 tablet by mouth daily.     Nutritional Supplements (ENSURE MAX PROTEIN PO) Take 1 Can by mouth every other day.     QUEtiapine (SEROQUEL) 50 MG tablet Take 1 tablet (50 mg total) by mouth at bedtime.     spironolactone (ALDACTONE) 25 MG tablet Take 0.5 tablets (12.5 mg total) by mouth daily.     torsemide (DEMADEX) 20 MG tablet Take 1 tablet (20 mg total) by mouth daily as needed. 45 tablet 3   No current facility-administered medications for this visit.    Allergies:   Sulfa antibiotics   Social History:  The patient  reports that she has never smoked. She has never used smokeless tobacco. She reports that she does not drink alcohol and does not use drugs.   Family History:  The patient's family history includes Diabetes in her mother; Heart attack in her father and mother.    ROS:  Please see the history of present illness.   Otherwise, review of systems is positive for none.   All other systems are reviewed and negative.    PHYSICAL EXAM: VS:  BP 124/60   Pulse 64   Ht '5\' 5"'$  (1.651 m)   Wt 159 lb 6.4 oz (72.3 kg)   SpO2 95%   BMI 26.53 kg/m  , BMI Body mass index is 26.53 kg/m. GEN: Well nourished, well developed, in no acute distress  HEENT: normal  Neck: no JVD, carotid bruits, or masses Cardiac: RRR; no  murmurs, rubs, or gallops,no edema  Respiratory:  clear to auscultation bilaterally, normal work of breathing GI: soft, nontender, nondistended, + BS MS: no deformity or atrophy  Skin: warm and dry, device pocket is well healed Neuro:  Strength and sensation are intact Psych: euthymic mood, full affect  EKG:  EKG is not ordered today. Personal review of the ekg ordered 12/03/2020 shows sinus rhythm, ventricular paced  Device interrogation is reviewed today in detail.  See PaceArt for details.   Recent Labs: 08/06/2020: ALT 32 08/10/2020: Magnesium 2.1 10/05/2020: B Natriuretic Peptide 149.1; Hemoglobin 11.9; Platelets 244 10/17/2020: BUN 29; Creatinine, Ser 1.82; Potassium 3.9; Sodium 133    Lipid Panel     Component Value Date/Time   CHOL 170 07/21/2020 0442   TRIG 134 07/21/2020 0442   HDL 51  07/21/2020 0442   CHOLHDL 3.3 07/21/2020 0442   VLDL 27 07/21/2020 0442   LDLCALC 92 07/21/2020 0442     Wt Readings from Last 3 Encounters:  11/21/20 159 lb 6.4 oz (72.3 kg)  10/05/20 162 lb 9.6 oz (73.8 kg)  09/15/20 165 lb 3.2 oz (74.9 kg)      Other studies Reviewed: Additional studies/ records that were reviewed today include: TTE 08/01/20  Review of the above records today demonstrates:   1. Inferior basal septal distal anterior wall hypokinesis consistent with  multi vessel CAD. Left ventricular ejection fraction, by estimation, is 40  to 45%. The left ventricle has mildly decreased function. The left  ventricle demonstrates regional wall  motion abnormalities (see scoring diagram/findings for description). The  left ventricular internal cavity size was moderately dilated. Left  ventricular diastolic parameters are consistent with Grade I diastolic  dysfunction (impaired relaxation).   2. ? catheters in RA/RV . Right ventricular systolic function is normal.  The right ventricular size is normal. There is normal pulmonary artery  systolic pressure.   3. Left atrial size was  mildly dilated.   4. Right atrial size was mildly dilated.   5. A small pericardial effusion is present. The pericardial effusion is  lateral to the left ventricle and posterior to the left ventricle.  Moderate pleural effusion in the left lateral region.   6. The mitral valve is abnormal. Mild mitral valve regurgitation. No  evidence of mitral stenosis.   7. Tricuspid valve regurgitation is moderate.   8. The aortic valve is normal in structure. Aortic valve regurgitation is  trivial. No aortic stenosis is present.   9. The inferior vena cava is dilated in size with >50% respiratory  variability, suggesting right atrial pressure of 8 mmHg.    ASSESSMENT AND PLAN:  1.  Complete heart block: Status post Abbott CRT-P implanted 07/27/2020.  Device functioning appropriately.  No changes at this time.  2.  Chronic systolic heart failure due to ischemic cardiomyopathy: Ejection 40 to 45%.  Currently on optimal medical therapy for heart failure cardiology.  3.  Coronary artery disease: Status post CABG x5.  No current chest pain.  4.  Paroxysmal atrial fibrillation: Currently on Eliquis.  CHA2DS2-VASc of 5.  Current medicines are reviewed at length with the patient today.   The patient does not have concerns regarding her medicines.  The following changes were made today:  none  Labs/ tests ordered today include:  No orders of the defined types were placed in this encounter.    Disposition:   FU with Keneshia Tena 9 months  Signed, Aslee Such Meredith Leeds, MD  11/21/2020 1:57 PM     [address] [place] [address] [phone] (office) [phone] (fax)

## 2020-11-21 ENCOUNTER — Ambulatory Visit (INDEPENDENT_AMBULATORY_CARE_PROVIDER_SITE_OTHER): Payer: Medicare HMO | Admitting: Cardiology

## 2020-11-21 ENCOUNTER — Other Ambulatory Visit: Payer: Self-pay

## 2020-11-21 ENCOUNTER — Encounter: Payer: Self-pay | Admitting: Cardiology

## 2020-11-21 VITALS — BP 124/60 | HR 64 | Ht 65.0 in | Wt 159.4 lb

## 2020-11-21 DIAGNOSIS — I442 Atrioventricular block, complete: Secondary | ICD-10-CM

## 2020-11-24 DIAGNOSIS — K439 Ventral hernia without obstruction or gangrene: Secondary | ICD-10-CM | POA: Diagnosis not present

## 2020-11-24 DIAGNOSIS — I700 Atherosclerosis of aorta: Secondary | ICD-10-CM | POA: Diagnosis not present

## 2020-11-24 DIAGNOSIS — N261 Atrophy of kidney (terminal): Secondary | ICD-10-CM | POA: Diagnosis not present

## 2020-11-24 DIAGNOSIS — N281 Cyst of kidney, acquired: Secondary | ICD-10-CM | POA: Diagnosis not present

## 2020-12-07 DIAGNOSIS — E119 Type 2 diabetes mellitus without complications: Secondary | ICD-10-CM | POA: Diagnosis not present

## 2020-12-07 DIAGNOSIS — F329 Major depressive disorder, single episode, unspecified: Secondary | ICD-10-CM | POA: Diagnosis not present

## 2020-12-07 DIAGNOSIS — E785 Hyperlipidemia, unspecified: Secondary | ICD-10-CM | POA: Diagnosis not present

## 2020-12-07 DIAGNOSIS — R748 Abnormal levels of other serum enzymes: Secondary | ICD-10-CM | POA: Diagnosis not present

## 2020-12-07 DIAGNOSIS — Z7901 Long term (current) use of anticoagulants: Secondary | ICD-10-CM | POA: Diagnosis not present

## 2020-12-07 DIAGNOSIS — I10 Essential (primary) hypertension: Secondary | ICD-10-CM | POA: Diagnosis not present

## 2020-12-15 ENCOUNTER — Telehealth (HOSPITAL_COMMUNITY): Payer: Self-pay | Admitting: Pharmacy Technician

## 2020-12-15 ENCOUNTER — Other Ambulatory Visit (HOSPITAL_COMMUNITY): Payer: Self-pay

## 2020-12-15 NOTE — Telephone Encounter (Signed)
Advanced Heart Failure Patient Advocate Encounter  Spoke with patient regarding LIS outcome. She stated that she received the denial and gave it to another office who is trying to help her get assistance with needy meds. Said she would follow up with me if she needed help with the Jardiance at another time.   It sounds like she may have met her 3% OOP to send in Eliquis assistance application with [employer]. Stated she would send the application back in the mail hopefully tomorrow.

## 2020-12-23 DIAGNOSIS — I10 Essential (primary) hypertension: Secondary | ICD-10-CM | POA: Diagnosis not present

## 2020-12-23 DIAGNOSIS — E119 Type 2 diabetes mellitus without complications: Secondary | ICD-10-CM | POA: Diagnosis not present

## 2020-12-23 DIAGNOSIS — F329 Major depressive disorder, single episode, unspecified: Secondary | ICD-10-CM | POA: Diagnosis not present

## 2021-01-05 ENCOUNTER — Encounter (HOSPITAL_COMMUNITY): Payer: Medicare HMO | Admitting: Internal Medicine

## 2021-01-13 DIAGNOSIS — E119 Type 2 diabetes mellitus without complications: Secondary | ICD-10-CM | POA: Diagnosis not present

## 2021-01-24 DIAGNOSIS — I129 Hypertensive chronic kidney disease with stage 1 through stage 4 chronic kidney disease, or unspecified chronic kidney disease: Secondary | ICD-10-CM | POA: Insufficient documentation

## 2021-01-24 DIAGNOSIS — Z794 Long term (current) use of insulin: Secondary | ICD-10-CM | POA: Diagnosis not present

## 2021-01-24 DIAGNOSIS — N184 Chronic kidney disease, stage 4 (severe): Secondary | ICD-10-CM | POA: Insufficient documentation

## 2021-01-24 DIAGNOSIS — E1122 Type 2 diabetes mellitus with diabetic chronic kidney disease: Secondary | ICD-10-CM | POA: Insufficient documentation

## 2021-01-24 DIAGNOSIS — D631 Anemia in chronic kidney disease: Secondary | ICD-10-CM | POA: Diagnosis not present

## 2021-01-24 DIAGNOSIS — N289 Disorder of kidney and ureter, unspecified: Secondary | ICD-10-CM | POA: Diagnosis not present

## 2021-01-26 ENCOUNTER — Ambulatory Visit (INDEPENDENT_AMBULATORY_CARE_PROVIDER_SITE_OTHER): Payer: Medicare HMO

## 2021-01-26 DIAGNOSIS — I442 Atrioventricular block, complete: Secondary | ICD-10-CM

## 2021-01-26 LAB — CUP PACEART REMOTE DEVICE CHECK
Battery Remaining Longevity: 70 mo
Battery Remaining Percentage: 86 %
Battery Voltage: 2.99 V
Brady Statistic AP VP Percent: 28 %
Brady Statistic AP VS Percent: 1 %
Brady Statistic AS VP Percent: 72 %
Brady Statistic AS VS Percent: 1 %
Brady Statistic RA Percent Paced: 28 %
Date Time Interrogation Session: 20220929040009
Implantable Lead Implant Date: 20220330
Implantable Lead Implant Date: 20220330
Implantable Lead Implant Date: 20220330
Implantable Lead Location: 753858
Implantable Lead Location: 753859
Implantable Lead Location: 753860
Implantable Pulse Generator Implant Date: 20220330
Lead Channel Impedance Value: 350 Ohm
Lead Channel Impedance Value: 450 Ohm
Lead Channel Impedance Value: 580 Ohm
Lead Channel Pacing Threshold Amplitude: 0.5 V
Lead Channel Pacing Threshold Amplitude: 0.75 V
Lead Channel Pacing Threshold Amplitude: 1 V
Lead Channel Pacing Threshold Pulse Width: 0.5 ms
Lead Channel Pacing Threshold Pulse Width: 0.5 ms
Lead Channel Pacing Threshold Pulse Width: 0.7 ms
Lead Channel Sensing Intrinsic Amplitude: 12 mV
Lead Channel Sensing Intrinsic Amplitude: 2.8 mV
Lead Channel Setting Pacing Amplitude: 2 V
Lead Channel Setting Pacing Amplitude: 2 V
Lead Channel Setting Pacing Amplitude: 2.5 V
Lead Channel Setting Pacing Pulse Width: 0.5 ms
Lead Channel Setting Pacing Pulse Width: 0.7 ms
Lead Channel Setting Sensing Sensitivity: 4 mV
Pulse Gen Model: 3562
Pulse Gen Serial Number: 3862449

## 2021-02-06 NOTE — Progress Notes (Signed)
Remote pacemaker transmission.   

## 2021-03-14 ENCOUNTER — Ambulatory Visit (HOSPITAL_COMMUNITY)
Admission: RE | Admit: 2021-03-14 | Discharge: 2021-03-14 | Disposition: A | Discharge status: Home / Self Care | Payer: Medicare HMO | Source: Ambulatory Visit | Attending: Internal Medicine | Admitting: Internal Medicine

## 2021-03-14 ENCOUNTER — Encounter (HOSPITAL_COMMUNITY): Payer: Self-pay | Admitting: Internal Medicine

## 2021-03-14 VITALS — BP 120/60 | HR 69 | Wt 151.8 lb

## 2021-03-14 DIAGNOSIS — Z7984 Long term (current) use of oral hypoglycemic drugs: Secondary | ICD-10-CM | POA: Insufficient documentation

## 2021-03-14 DIAGNOSIS — I5022 Chronic systolic (congestive) heart failure: Secondary | ICD-10-CM | POA: Insufficient documentation

## 2021-03-14 DIAGNOSIS — I252 Old myocardial infarction: Secondary | ICD-10-CM | POA: Insufficient documentation

## 2021-03-14 DIAGNOSIS — E1122 Type 2 diabetes mellitus with diabetic chronic kidney disease: Secondary | ICD-10-CM | POA: Diagnosis not present

## 2021-03-14 DIAGNOSIS — I255 Ischemic cardiomyopathy: Secondary | ICD-10-CM | POA: Insufficient documentation

## 2021-03-14 DIAGNOSIS — Z951 Presence of aortocoronary bypass graft: Secondary | ICD-10-CM | POA: Diagnosis not present

## 2021-03-14 DIAGNOSIS — I130 Hypertensive heart and chronic kidney disease with heart failure and stage 1 through stage 4 chronic kidney disease, or unspecified chronic kidney disease: Secondary | ICD-10-CM | POA: Diagnosis not present

## 2021-03-14 DIAGNOSIS — F32A Depression, unspecified: Secondary | ICD-10-CM | POA: Diagnosis not present

## 2021-03-14 DIAGNOSIS — N1832 Chronic kidney disease, stage 3b: Secondary | ICD-10-CM | POA: Insufficient documentation

## 2021-03-14 DIAGNOSIS — I480 Paroxysmal atrial fibrillation: Secondary | ICD-10-CM | POA: Diagnosis not present

## 2021-03-14 DIAGNOSIS — I2510 Atherosclerotic heart disease of native coronary artery without angina pectoris: Secondary | ICD-10-CM | POA: Diagnosis not present

## 2021-03-14 DIAGNOSIS — Z7901 Long term (current) use of anticoagulants: Secondary | ICD-10-CM | POA: Insufficient documentation

## 2021-03-14 MED ORDER — LOSARTAN POTASSIUM 25 MG PO TABS: 12.5000 mg | ORAL_TABLET | Freq: Every day | ORAL | 6 refills | Status: DC

## 2021-03-14 NOTE — Patient Instructions (Signed)
  Restart Losartan 12.5 mg ( 1/2 tablet) at bedtime.  You have been referred to [facility]. They will call you with your appointment date.  Your physician recommends that you schedule a follow-up appointment in: 4 months.  If you have any questions or concerns before your next appointment please send [place] a message through [place] or call our office at [phone].    TO LEAVE A MESSAGE FOR THE NURSE SELECT OPTION 2, PLEASE LEAVE A MESSAGE INCLUDING: YOUR NAME DATE OF BIRTH CALL BACK NUMBER REASON FOR CALL**this is important as we prioritize the call backs  YOU WILL RECEIVE A CALL BACK THE SAME DAY AS LONG AS YOU CALL BEFORE 4:00 PM  At the [facility], you and your health needs are our priority. As part of our continuing mission to provide you with exceptional heart care, we have created designated Provider Care Teams. These Care Teams include your primary Cardiologist (physician) and Advanced Practice Providers (APPs- Physician Assistants and Nurse Practitioners) who all work together to provide you with the care you need, when you need it.   You may see any of the following providers on your designated Care Team at your next follow up: Dr [name] Dr Haynes Kerns, NP Lyda Jester, [place] [facility], [place] Audry Riles, PharmD   Please be sure to bring in all your medications bottles to every appointment.

## 2021-03-14 NOTE — Progress Notes (Signed)
Primary Care: Duard Larsen, AGNP Primary Cardiologist: Charolette Forward, Denim Kalmbach, Quillian Quince  HPI:  Kristin [name] is a 80 yo female with PMH significant for  T2DM, HTN, Depression, recently diagnosed CAD s/p emergent CABG 2/2 NSTEMI, Systolic CHF, ischemic cardiomyopathy, CHB s/p CRT-P, post op PAF.    No prior cardiac history until 07/20/20 when she presented with chest pain radiating to left arm and shortness of breath, she was in NSR with LBBB, troponin 3,200->3,300 consistent with NSTEMI.  She had a very complicated hospital course.  Her 07/20/20 ECHO with EF 40-45% and RWMA. Started on IV heparin, She underwent catheterization on 3/24 and was found to have multivessel CAD with LM involvement.  She required placement of an IABP.  TCTS consulted and agreed she would require emergent coronary bypass grafting. 3/24 underwent emergent CABG x 5 (LIMA to LAD, SEQ SVG to OM and Diagonal, SEQ to PL and PDA).  After surgery patient persistently hypotensive despite inotropic and vasopressor support.  07/22/20 TEE performed and consistent with tamponade.  Taken back to OR for tamponade. EF on TEE 55%, RV mildly down (3/25).  Post takeback still somewhat hypotensive required continued milrinone support.  Difficulty with adding GDMT milrinone stopped and midodrine added 07/25/20.  She also developed post op CHB and was being paced by epicardial wires with no improvement in conduction therefore CRT-P was placed on 07/27/20. \\  Repeat ECHO 08/02/20 with EF 40-45%, moderate TR, G1DD, Inferior basal septal distal anterior wall hypokinesis.    Here for f/u with her daughter. Doing fairly well. Continues to get stronger. Says her PCP stopped several medicines including losartan and spiro due to low BP. Denies CP, SOB, orthopnea or PND. No recent dizziness.    Past Medical History:  Diagnosis Date   Depression    Diabetes mellitus without complication (H[facility])    Hypertension    Night sweats     Current Outpatient  Medications  Medication Sig Dispense Refill   acetaminophen (TYLENOL) 325 MG tablet Take 2 tablets (650 mg total) by mouth every 6 (six) hours as needed for mild pain or fever.     apixaban (ELIQUIS) 5 MG TABS tablet Take 1 tablet (5 mg total) by mouth 2 (two) times daily. 60 tablet    aspirin 81 MG chewable tablet Chew 1 tablet (81 mg total) by mouth daily.     buPROPion (WELLBUTRIN XL) 150 MG 24 hr tablet      empagliflozin (JARDIANCE) 10 MG TABS tablet Take 1 tablet (10 mg total) by mouth daily before breakfast. 30 tablet 11   insulin detemir (LEVEMIR) 100 UNIT/ML injection Inject 0.24 mLs (24 Units total) into the skin daily. 10 mL 11   insulin detemir (LEVEMIR) 100 UNIT/ML injection Inject 12 Units into the skin at bedtime.     metoprolol succinate (TOPROL-XL) 25 MG 24 hr tablet Take 1 tablet (25 mg total) by mouth daily. 60 tablet 1   Nutritional Supplements (ENSURE MAX PROTEIN PO) Take 1 Can by mouth every other day.     No current facility-administered medications for this encounter.    Allergies  Allergen Reactions   Sulfa Antibiotics Hives      Social History   Socioeconomic History   Marital status: Widowed    Spouse name: Not on file   Number of children: Not on file   Years of education: Not on file   Highest education level: Not on file  Occupational History   Occupation: retired   Tobacco Use  Smoking status: Never   Smokeless tobacco: Never  Vaping Use   Vaping Use: Never used  Substance and Sexual Activity   Alcohol use: No   Drug use: No   Sexual activity: Not on file  Other Topics Concern   Not on file  Social History Narrative   Not on file   Social Determinants of Health   Financial Resource Strain: Low Risk    Difficulty of Paying Living Expenses: Not very hard  Food Insecurity: Not on file  Transportation Needs: No Transportation Needs   Lack of Transportation (Medical): No   Lack of Transportation (Non-Medical): No  Physical Activity: Not  on file  Stress: Not on file  Social Connections: Not on file  Intimate Partner Violence: Not on file      Family History  Problem Relation Age of Onset   Diabetes Mother    Heart attack Mother    Heart attack Father     Vitals:   03/14/21 1422  BP: 120/60  Pulse: 69  SpO2: 97%  Weight: 68.9 kg (151 lb 12.8 oz)    PHYSICAL EXAM: General:  Well appearing. No resp difficulty HEENT: normal Neck: supple. no JVD. Carotids 2+ bilat; no bruits. No lymphadenopathy or thryomegaly appreciated. Cor: PMI nondisplaced. Regular rate & rhythm. No rubs, gallops or murmurs. Lungs: clear Abdomen: soft, nontender, nondistended. No hepatosplenomegaly. No bruits or masses. Good bowel sounds. Extremities: no cyanosis, clubbing, rash, edema Neuro: alert & orientedx3, cranial nerves grossly intact. moves all 4 extremities w/o difficulty. Affect pleasant   ECG: Atrial sense BIV pacing rate 71 underlying rhythm NSR Personally reviewed   ASSESSMENT & PLAN:  1. Chronic Systolic CHF, Ischemic Cardiomyopathy - S/p CABG x5 3/24 with take back for tamponade on 3/25 - LVEF 55% on post-op TEE 3/25-->40-45% on TTE 08/01/20 - Improved NYHA II. Stamina getting better - Volume status ok. Continue Jardiance 10. No longer on torsemide - restart losartan 12.5 qhs - watch for hypotension - continue toprol 25   2. CAD s/p NSTEMI  - CABG x 5 on 07/21/20 as detailed above - No s/s ischemia - Continue ASA and apixaban. Stop ASA at next visit - Continue statin - continue toprol 25   4. CKD 3b-IV -last SCr 1.6-1.8 -labs today - Refer to [place] [facility]   5. CHB - S/P CRT-P-- 07/27/20  - f/u in [facility]   6. PAF - Had several recurrent episodes of Afib on the monitor on 4/7 and 4/9 -Continue apixaban 5mg  BID  -In NSR today. No bleeding  7. DM2 -Following with Ms Dimas Millin -continue jardiance 10  [name], MD  2:51 PM

## 2021-03-16 NOTE — Addendum Note (Signed)
Encounter addended by: Jerl Mina, RN on: 03/16/2021 9:35 AM  Actions taken: Clinical Note Signed

## 2021-03-16 NOTE — Progress Notes (Signed)
Referral faxed to [facility] on  03/16/2021. Fax transmission ok.

## 2021-03-29 ENCOUNTER — Telehealth (HOSPITAL_COMMUNITY): Payer: Self-pay | Admitting: Pharmacy Technician

## 2021-03-29 NOTE — Telephone Encounter (Signed)
Advanced Heart Failure Patient Advocate Encounter  Sent in [employer] application via fax.  Will follow up.

## 2021-04-18 NOTE — Telephone Encounter (Signed)
Advanced Heart Failure Patient Advocate Encounter   Patient was approved to receive Eliquis from [employer]  Effective through 04/29/21  Charlann Boxer, CPhT

## 2021-04-27 ENCOUNTER — Ambulatory Visit (INDEPENDENT_AMBULATORY_CARE_PROVIDER_SITE_OTHER): Payer: Medicare HMO

## 2021-04-27 DIAGNOSIS — I5022 Chronic systolic (congestive) heart failure: Secondary | ICD-10-CM | POA: Diagnosis not present

## 2021-04-27 LAB — CUP PACEART REMOTE DEVICE CHECK
Battery Remaining Longevity: 71 mo
Battery Remaining Percentage: 83 %
Battery Voltage: 2.99 V
Brady Statistic AP VP Percent: 13 %
Brady Statistic AP VS Percent: 1 %
Brady Statistic AS VP Percent: 86 %
Brady Statistic AS VS Percent: 1 %
Brady Statistic RA Percent Paced: 13 %
Date Time Interrogation Session: 20221229040018
Implantable Lead Implant Date: 20220330
Implantable Lead Implant Date: 20220330
Implantable Lead Implant Date: 20220330
Implantable Lead Location: 753858
Implantable Lead Location: 753859
Implantable Lead Location: 753860
Implantable Pulse Generator Implant Date: 20220330
Lead Channel Impedance Value: 410 Ohm
Lead Channel Impedance Value: 460 Ohm
Lead Channel Impedance Value: 630 Ohm
Lead Channel Pacing Threshold Amplitude: 0.5 V
Lead Channel Pacing Threshold Amplitude: 0.75 V
Lead Channel Pacing Threshold Amplitude: 1 V
Lead Channel Pacing Threshold Pulse Width: 0.5 ms
Lead Channel Pacing Threshold Pulse Width: 0.5 ms
Lead Channel Pacing Threshold Pulse Width: 0.7 ms
Lead Channel Sensing Intrinsic Amplitude: 12 mV
Lead Channel Sensing Intrinsic Amplitude: 3.8 mV
Lead Channel Setting Pacing Amplitude: 2 V
Lead Channel Setting Pacing Amplitude: 2 V
Lead Channel Setting Pacing Amplitude: 2.5 V
Lead Channel Setting Pacing Pulse Width: 0.5 ms
Lead Channel Setting Pacing Pulse Width: 0.7 ms
Lead Channel Setting Sensing Sensitivity: 4 mV
Pulse Gen Model: 3562
Pulse Gen Serial Number: 3862449

## 2021-05-09 NOTE — Progress Notes (Signed)
Remote pacemaker transmission.   

## 2021-05-29 ENCOUNTER — Telehealth (HOSPITAL_COMMUNITY): Payer: Self-pay

## 2021-05-29 NOTE — Telephone Encounter (Signed)
Pt called stating that she has been having heartburn and been feeling nauseated. She states she has appointment 07/12/2021 but would like to know is there anything you could recommend for her until then.

## 2021-07-12 ENCOUNTER — Ambulatory Visit (HOSPITAL_COMMUNITY)
Admission: RE | Admit: 2021-07-12 | Discharge: 2021-07-12 | Disposition: A | Discharge status: Home / Self Care | Payer: Medicare HMO | Source: Ambulatory Visit | Attending: Internal Medicine | Admitting: Internal Medicine

## 2021-07-12 ENCOUNTER — Other Ambulatory Visit: Payer: Self-pay

## 2021-07-12 ENCOUNTER — Encounter (HOSPITAL_COMMUNITY): Payer: Self-pay | Admitting: Internal Medicine

## 2021-07-12 ENCOUNTER — Ambulatory Visit (HOSPITAL_BASED_OUTPATIENT_CLINIC_OR_DEPARTMENT_OTHER)
Admission: RE | Admit: 2021-07-12 | Discharge: 2021-07-12 | Disposition: A | Discharge status: Home / Self Care | Payer: Medicare HMO | Source: Ambulatory Visit | Attending: Internal Medicine | Admitting: Internal Medicine

## 2021-07-12 VITALS — BP 110/68 | HR 77 | Wt 141.4 lb

## 2021-07-12 DIAGNOSIS — I5042 Chronic combined systolic (congestive) and diastolic (congestive) heart failure: Secondary | ICD-10-CM | POA: Insufficient documentation

## 2021-07-12 DIAGNOSIS — I447 Left bundle-branch block, unspecified: Secondary | ICD-10-CM | POA: Diagnosis not present

## 2021-07-12 DIAGNOSIS — E1122 Type 2 diabetes mellitus with diabetic chronic kidney disease: Secondary | ICD-10-CM | POA: Diagnosis not present

## 2021-07-12 DIAGNOSIS — Z7984 Long term (current) use of oral hypoglycemic drugs: Secondary | ICD-10-CM | POA: Insufficient documentation

## 2021-07-12 DIAGNOSIS — I255 Ischemic cardiomyopathy: Secondary | ICD-10-CM | POA: Diagnosis not present

## 2021-07-12 DIAGNOSIS — I2510 Atherosclerotic heart disease of native coronary artery without angina pectoris: Secondary | ICD-10-CM | POA: Diagnosis not present

## 2021-07-12 DIAGNOSIS — Z79899 Other long term (current) drug therapy: Secondary | ICD-10-CM | POA: Insufficient documentation

## 2021-07-12 DIAGNOSIS — I5022 Chronic systolic (congestive) heart failure: Secondary | ICD-10-CM | POA: Diagnosis not present

## 2021-07-12 DIAGNOSIS — Z950 Presence of cardiac pacemaker: Secondary | ICD-10-CM | POA: Insufficient documentation

## 2021-07-12 DIAGNOSIS — Z951 Presence of aortocoronary bypass graft: Secondary | ICD-10-CM | POA: Insufficient documentation

## 2021-07-12 DIAGNOSIS — I252 Old myocardial infarction: Secondary | ICD-10-CM | POA: Diagnosis not present

## 2021-07-12 DIAGNOSIS — N1832 Chronic kidney disease, stage 3b: Secondary | ICD-10-CM | POA: Diagnosis not present

## 2021-07-12 DIAGNOSIS — I480 Paroxysmal atrial fibrillation: Secondary | ICD-10-CM | POA: Insufficient documentation

## 2021-07-12 DIAGNOSIS — Z7901 Long term (current) use of anticoagulants: Secondary | ICD-10-CM | POA: Insufficient documentation

## 2021-07-12 DIAGNOSIS — I130 Hypertensive heart and chronic kidney disease with heart failure and stage 1 through stage 4 chronic kidney disease, or unspecified chronic kidney disease: Secondary | ICD-10-CM | POA: Diagnosis not present

## 2021-07-12 LAB — BASIC METABOLIC PANEL
Anion gap: 13 (ref 5–15)
BUN: 21 mg/dL (ref 8–23)
CO2: 26 mmol/L (ref 22–32)
Calcium: 10 mg/dL (ref 8.9–10.3)
Chloride: 102 mmol/L (ref 98–111)
Creatinine, Ser: 1.63 mg/dL — ABNORMAL HIGH (ref 0.44–1.00)
GFR, Estimated: 32 mL/min — ABNORMAL LOW (ref >60)
Glucose, Bld: 128 mg/dL — ABNORMAL HIGH (ref 70–99)
Potassium: 4.1 mmol/L (ref 3.5–5.1)
Sodium: 141 mmol/L (ref 135–145)

## 2021-07-12 LAB — BRAIN NATRIURETIC PEPTIDE: B Natriuretic Peptide: 44.3 pg/mL (ref 0.0–100.0)

## 2021-07-12 LAB — ECHOCARDIOGRAM COMPLETE
Area-P 1/2: 5.42 cm2
Calc EF: 36 %
S' Lateral: 2.5 cm
Single Plane A2C EF: 30.7 %
Single Plane A4C EF: 38.1 %

## 2021-07-12 NOTE — Progress Notes (Signed)
?  Echocardiogram ?2D Echocardiogram has been performed. ? ?Kristin [name] ?07/12/2021, 3:10 PM ?

## 2021-07-12 NOTE — Patient Instructions (Signed)
Medication Changes: ? ?STOP Aspirin ? ?Lab Work: ? ?Done today, we will call you for abnormal results ? ?Testing/Procedures: ? ?None ? ?Referrals: ? ?You have been referred to follow-up with Dr [name], his office will call you to schedule an appointment ? ?Special Instructions // Education: ? ?Do the following things EVERYDAY: ?Weigh yourself in the morning before breakfast. Write it down and keep it in a log. ?Take your medicines as prescribed ?Eat low salt foods--Limit salt (sodium) to 2000 mg per day.  ?Stay as active as you can everyday ?Limit all fluids for the day to less than 2 liters ? ? ?Follow-Up in: 6 months (Sept 2023), **PLEASE CALL OUR OFFICE IN July TO SCHEDULE THIS APPOINTMENT ? ?At the [facility], you and your health needs are our priority. We have a designated team specialized in the treatment of Heart Failure. This Care Team includes your primary Heart Failure Specialized Cardiologist (physician), Advanced Practice Providers (APPs- Physician Assistants and Nurse Practitioners), and Pharmacist who all work together to provide you with the care you need, when you need it.  ? ?You may see any of the following providers on your designated Care Team at your next follow up: ? ?Dr [name] ?Dr Loralie Champagne ?Darrick Grinder, NP ?Lyda Jester, PA ?Jessica Milford,NP ?Marlyce Huge, PA ?Audry Riles, PharmD ? ? ?Please be sure to bring in all your medications bottles to every appointment.  ? ?Need to Contact us: ? ?If you have any questions or concerns before your next appointment please send [place] a message through [place] or call our office at [phone].   ? ?TO LEAVE A MESSAGE FOR THE NURSE SELECT OPTION 2, PLEASE LEAVE A MESSAGE INCLUDING: ?YOUR NAME ?DATE OF BIRTH ?CALL BACK NUMBER ?REASON FOR CALL**this is important as we prioritize the call backs ? ?YOU WILL RECEIVE A CALL BACK THE SAME DAY AS LONG AS YOU CALL BEFORE 4:00 PM ? ? ?

## 2021-07-12 NOTE — Progress Notes (Signed)
? ? ?Primary Care: Duard Larsen, AGNP ?Primary Cardiologist: Charolette Forward ?AHF: Dr. Haroldine Laws, [facility]ince ? ?HPI: ? ?Kristin [name] is a 81 yo female with PMH significant for T2DM, HTN, Depression, recently diagnosed CAD s/p emergent CABG 2/2 NSTEMI, Systolic CHF, ischemic cardiomyopathy, CHB s/p CRT-P, post op PAF.   ? ?No prior cardiac history until 07/20/20 when she presented with chest pain radiating to left arm and shortness of breath, she was in NSR with LBBB, troponin 3,200->3,300 consistent with NSTEMI.  She had a very complicated hospital course.  Her 07/20/20 ECHO with EF 40-45% and RWMA. Started on IV heparin, She underwent catheterization on 3/24 and was found to have multivessel CAD with LM involvement.  She required placement of an IABP.  TCTS consulted and agreed she would require emergent coronary bypass grafting. 3/24 underwent emergent CABG x 5 (LIMA to LAD, SEQ SVG to OM and Diagonal, SEQ to PL and PDA).  After surgery patient persistently hypotensive despite inotropic and vasopressor support.  07/22/20 TEE performed and consistent with tamponade.  Taken back to OR for tamponade for mediastinal exploration and evacuation. 2 discrete bleeding points were seen, one on the IMA graft and one on the vein graft to the right coronary artery distribution, both repaired. EF on TEE 55%, RV mildly down.  Post takeback still somewhat hypotensive required continued milrinone support.  Difficulty with adding GDMT milrinone stopped and midodrine added 07/25/20.  She also developed post op CHB and was being paced by epicardial wires with no improvement in conduction therefore CRT-P was placed on 07/27/20.  ? ?Repeat ECHO 08/02/20 with EF 40-45%, moderate TR, G1DD, Inferior basal septal distal anterior wall hypokinesis.   ? ?Last seen in [facility] 11/22 and was doing well at the time. NYHA Class II. BP was stable, low dose losartan added. She was also referred to [facility] for CKD IIIb-IV.  ? ?She returns back today for f/u and  repeat echo. Echo shows EF 45-50%, RV normal, abnormal (paradoxical) septal motion, consistent with left bundle branch block. EKG shows atrial-sensed v-paced, prolonged AV conduction. QRS 162 ms.  ? ?Her wt is actually down 10 lb from previous visit, 151>>141 lb. She attributes wt loss to change in appetite. Says she is not a big eater. Also recently started on Rybelsus for DM. BP controlled 110/68. NYHA Class II-III. No resting dyspnea. No orthopnea/PND. No LEE. Denies CP.  ? ?She reports she stopped taking losartan. Did not tolerate due to dizziness. She was not able to make it to her appointment w/ nephrology due to lack of transportation that day but plans to reschedule.  ? ?Attempted device interrogation, unable to submit transmission ? ?Echo 3/22 EF 40-45% ?Echo 4/22 EF 40-45%, Mod TR, G1DD, Inferior basal septal distal anterior wall hypokinesis.   ?Echo 3/23 (today) EF 45-50%, RV normal, abnormal (paradoxical) septal motion, consistent with left bundle branch block ? ? ? ?Past Medical History:  ?Diagnosis Date  ? Depression   ? Diabetes mellitus without complication ([place])   ? Hypertension   ? Night sweats   ? ? ?Current Outpatient Medications  ?Medication Sig Dispense Refill  ? acetaminophen (TYLENOL) 325 MG tablet Take 2 tablets (650 mg total) by mouth every 6 (six) hours as needed for mild pain or fever.    ? apixaban (ELIQUIS) 5 MG TABS tablet Take 1 tablet (5 mg total) by mouth 2 (two) times daily. 60 tablet   ? aspirin 81 MG chewable tablet Chew 1 tablet (81 mg total) by mouth  daily.    ? ATORVASTATIN CALCIUM PO Take 1 tablet by mouth daily in the afternoon.    ? buPROPion (WELLBUTRIN XL) 150 MG 24 hr tablet     ? empagliflozin (JARDIANCE) 10 MG TABS tablet Take 1 tablet (10 mg total) by mouth daily before breakfast. 30 tablet 11  ? insulin detemir (LEVEMIR) 100 UNIT/ML injection Inject 0.24 mLs (24 Units total) into the skin daily. 10 mL 11  ? insulin detemir (LEVEMIR) 100 UNIT/ML injection Inject 14  Units into the skin at bedtime.    ? metoprolol succinate (TOPROL-XL) 25 MG 24 hr tablet Take 1 tablet (25 mg total) by mouth daily. 60 tablet 1  ? Nutritional Supplements (ENSURE MAX PROTEIN PO) Take 1 Can by mouth every other day.    ? Semaglutide (RYBELSUS) 14 MG TABS Take 14 mg by mouth daily.    ? ?No current facility-administered medications for this encounter.  ? ? ?Allergies  ?Allergen Reactions  ? Sulfa Antibiotics Hives  ? ? ?  ?Social History  ? ?Socioeconomic History  ? Marital status: Widowed  ?  Spouse name: Not on file  ? Number of children: Not on file  ? Years of education: Not on file  ? Highest education level: Not on file  ?Occupational History  ? Occupation: retired   ?Tobacco Use  ? Smoking status: Never  ? Smokeless tobacco: Never  ?Vaping Use  ? Vaping Use: Never used  ?Substance and Sexual Activity  ? Alcohol use: No  ? Drug use: No  ? Sexual activity: Not on file  ?Other Topics Concern  ? Not on file  ?Social History Narrative  ? Not on file  ? ?Social Determinants of Health  ? ?Financial Resource Strain: Low Risk   ? Difficulty of Paying Living Expenses: Not very hard  ?Food Insecurity: Not on file  ?Transportation Needs: No Transportation Needs  ? Lack of Transportation (Medical): No  ? Lack of Transportation (Non-Medical): No  ?Physical Activity: Not on file  ?Stress: Not on file  ?Social Connections: Not on file  ?Intimate Partner Violence: Not on file  ? ? ?  ?Family History  ?Problem Relation Age of Onset  ? Diabetes Mother   ? Heart attack Mother   ? Heart attack Father   ? ? ?Vitals:  ? 07/12/21 1519  ?BP: 110/68  ?Pulse: 77  ?SpO2: 98%  ?Weight: 64.1 kg (141 lb 6.4 oz)  ? ? ? ?PHYSICAL EXAM: ?General:  Well appearing, thin elderly WF in [place]. No respiratory difficulty ?HEENT: normal ?Neck: supple. no JVD. Carotids 2+ bilat; no bruits. No lymphadenopathy or thyromegaly appreciated. ?Cor: PMI nondisplaced. Regular rate & rhythm. No rubs, gallops or murmurs. ?Lungs: clear ?Abdomen:  soft, nontender, nondistended. No hepatosplenomegaly. No bruits or masses. Good bowel sounds. ?Extremities: no cyanosis, clubbing, rash, edema ?Neuro: alert & oriented x 3, cranial nerves grossly intact. moves all 4 extremities w/o difficulty. Affect pleasant. ? ? ? ?ECG: Atrial-sensed v-paced, prolonged AV conduction. QRS 162 ms  Personally reviewed ? ? ?ASSESSMENT & PLAN: ? ?1. Chronic Systolic CHF, Ischemic Cardiomyopathy ?- S/p CABG x5 in 3/22 ?- LVEF 55% on post-op TEE 3/25-->40-45% on TTE 08/01/20 ?- Echo today 07/12/21 EF 45-50%, RV normal. Abnormal (paradoxical) septal motion, consistent with left bundle branch block ?- NYHA II ?- Volume status ok. Euvolemic  ?- Continue Jardiance 10 mg daily . No longer on torsemide ?- Did not tolerate losartan due to dizziness ?- Continue Toprol XL 25 mg daily  ?-  Unable to submit device transmission for interrogation  ?- Check BMP today  ?  ?2. CAD s/p NSTEMI  ?- CABG x 5 in 3/22 as detailed above ?- No s/s ischemia ?- Stop ASA given Eliquis  ?- Continue statin ?- Continue Toprol XL 25 ?  ?4. CKD 3b-IV ?- last SCr 1.6-1.8, GFR 28  ?- on Jardiance 10 mg  ?- has been referred to [facility]  ?- Check BMP today  ? ?5. CHB ?- S/P CRT-P-- 07/27/20  ?- followed in [facility] ?- unable to send transmission today  ?- septal dyssynchrony noted on echo today  ?- QRS on EKG 162 ms  ?- Will refer back to Dr. [name] for device optimization  ?  ?6. PAF ?- Had several recurrent episodes of Afib on the monitor on 4/7 and 4/9 ?-Continue apixaban 5mg  BID  ?- EKG shows A-v paced rhythm  ? ?7. DM2 ?-Following with Ms Dimas Millin ?-continue jardiance 10 ? ?Return to EP clinic for device optimization. F/u w/ Dr. Haroldine Laws in 6 months  ? ?Lyda Jester, PA-C  ?3:24 PM ? ?Patient seen and examined with the above-signed Advanced Practice Provider and/or Housestaff. I personally reviewed laboratory data, imaging studies and relevant notes. I independently examined the patient and formulated the important  aspects of the plan. I have edited the note to reflect any of my changes or salient points. I have personally discussed the plan with the patient and/or family. ? ?Doing very well after CABG 3/22 and CRT-P. Now [place]

## 2021-07-27 ENCOUNTER — Ambulatory Visit (INDEPENDENT_AMBULATORY_CARE_PROVIDER_SITE_OTHER): Payer: Medicare HMO

## 2021-07-27 DIAGNOSIS — I442 Atrioventricular block, complete: Secondary | ICD-10-CM | POA: Diagnosis not present

## 2021-07-28 LAB — CUP PACEART REMOTE DEVICE CHECK
Battery Remaining Longevity: 67 mo
Battery Remaining Percentage: 80 %
Battery Voltage: 2.99 V
Brady Statistic AP VP Percent: 8.5 %
Brady Statistic AP VS Percent: 1 %
Brady Statistic AS VP Percent: 91 %
Brady Statistic AS VS Percent: 1 %
Brady Statistic RA Percent Paced: 8.5 %
Date Time Interrogation Session: 20230330183642
Implantable Lead Implant Date: 20220330
Implantable Lead Implant Date: 20220330
Implantable Lead Implant Date: 20220330
Implantable Lead Location: 753858
Implantable Lead Location: 753859
Implantable Lead Location: 753860
Implantable Pulse Generator Implant Date: 20220330
Lead Channel Impedance Value: 440 Ohm
Lead Channel Impedance Value: 480 Ohm
Lead Channel Impedance Value: 550 Ohm
Lead Channel Pacing Threshold Amplitude: 0.5 V
Lead Channel Pacing Threshold Amplitude: 0.75 V
Lead Channel Pacing Threshold Amplitude: 1 V
Lead Channel Pacing Threshold Pulse Width: 0.5 ms
Lead Channel Pacing Threshold Pulse Width: 0.5 ms
Lead Channel Pacing Threshold Pulse Width: 0.7 ms
Lead Channel Sensing Intrinsic Amplitude: 12 mV
Lead Channel Sensing Intrinsic Amplitude: 4 mV
Lead Channel Setting Pacing Amplitude: 2 V
Lead Channel Setting Pacing Amplitude: 2 V
Lead Channel Setting Pacing Amplitude: 2.5 V
Lead Channel Setting Pacing Pulse Width: 0.5 ms
Lead Channel Setting Pacing Pulse Width: 0.7 ms
Lead Channel Setting Sensing Sensitivity: 4 mV
Pulse Gen Model: 3562
Pulse Gen Serial Number: 3862449

## 2021-08-08 NOTE — Progress Notes (Signed)
Remote pacemaker transmission.   

## 2021-09-13 ENCOUNTER — Telehealth (HOSPITAL_COMMUNITY): Payer: Self-pay | Admitting: *Deleted

## 2021-09-13 NOTE — Telephone Encounter (Signed)
Received fax from [facility], they contacted pt to sch new pt appt however she did not want to schedule with them so they closed out referral. Message sent to Dr Haroldine Laws ?

## 2021-09-18 ENCOUNTER — Encounter: Payer: Medicare HMO | Admitting: Cardiology

## 2021-09-18 NOTE — Progress Notes (Deleted)
Electrophysiology Office Note   Date:  09/18/2021   ID:  Kristin [name] 1941-01-02, MRN [id number]  PCP:  Barnetta Chapel, NP  Cardiologist:  Jennings Books Primary Electrophysiologist:  Andoni Busch Meredith Leeds, MD    Chief Complaint: pacemaker   History of Present Illness: Kristin [name] is a 81 y.o. female who is being seen today for the evaluation of pacemaker at the request of Barnetta Chapel, NP. Presenting today for electrophysiology evaluation.  She has a history significant for diabetes, obesity, hypertension.  She was admitted to the hospital with non-STEMI was found to have left main stenosis and multivessel coronary artery disease.  She had emergent CABG three 1522 x 5.  Postop she developed complete heart block and is now status post Abbott CRT P implanted 07/27/2020.  Today, denies symptoms of palpitations, chest pain, shortness of breath, orthopnea, PND, lower extremity edema, claudication, dizziness, presyncope, syncope, bleeding, or neurologic sequela. The patient is tolerating medications without difficulties. ***    Past Medical History:  Diagnosis Date   Depression    Diabetes mellitus without complication ([place])    Hypertension    Night sweats    Past Surgical History:  Procedure Laterality Date   BIV PACEMAKER INSERTION CRT-P N/A 07/27/2020   Procedure: BIV PACEMAKER INSERTION CRT-P;  Surgeon: Constance Haw, MD;  Location: [facility];  Service: Cardiovascular;  Laterality: N/A;   CORONARY ARTERY BYPASS GRAFT N/A 07/21/2020   Procedure: CORONARY ARTERY BYPASS GRAFTING (CABG), TIMES FIVE, USING LEFT INTERNAL MAMMARY ARTERY AND ENDOSCOPICALLY HARVESTED BILATERAL GREATER SAPHENOUS VEINS;  Surgeon: [name], MD;  Location: [facility];  Service: Open Heart Surgery;  Laterality: N/A;   ENDOVEIN HARVEST OF GREATER SAPHENOUS VEIN N/A 07/21/2020   Procedure: ENDOVEIN HARVEST OF BILATERAL GREATER SAPHENOUS VEINS;  Surgeon: [name], MD;   Location: [facility];  Service: Open Heart Surgery;  Laterality: N/A;   EYE SURGERY     GALLBLADDER SURGERY     IABP INSERTION N/A 07/21/2020   Procedure: IABP Insertion;  Surgeon: Charolette Forward, MD;  Location: [facility];  Service: Cardiovascular;  Laterality: N/A;   IR THORACENTESIS ASP PLEURAL SPACE W/IMG GUIDE  08/02/2020   IR THORACENTESIS ASP PLEURAL SPACE W/IMG GUIDE  08/08/2020   LEFT HEART CATH AND CORONARY ANGIOGRAPHY N/A 07/21/2020   Procedure: LEFT HEART CATH AND CORONARY ANGIOGRAPHY;  Surgeon: Charolette Forward, MD;  Location: [facility];  Service: Cardiovascular;  Laterality: N/A;   MEDIASTINAL EXPLORATION N/A 07/22/2020   Procedure: MEDIASTINAL EXPLORATION;  Surgeon: [name], MD;  Location: [facility];  Service: Thoracic;  Laterality: N/A;   TEE WITHOUT CARDIOVERSION N/A 07/21/2020   Procedure: TRANSESOPHAGEAL ECHOCARDIOGRAM (TEE);  Surgeon: [name], MD;  Location: [facility];  Service: Open Heart Surgery;  Laterality: N/A;   TEE WITHOUT CARDIOVERSION N/A 07/22/2020   Procedure: TRANSESOPHAGEAL ECHOCARDIOGRAM (TEE);  Surgeon: [name], MD;  Location: [facility];  Service: Thoracic;  Laterality: N/A;   toenail surgery     both big toenails     Current Outpatient Medications  Medication Sig Dispense Refill   acetaminophen (TYLENOL) 325 MG tablet Take 2 tablets (650 mg total) by mouth every 6 (six) hours as needed for mild pain or fever.     apixaban (ELIQUIS) 5 MG TABS tablet Take 1 tablet (5 mg total) by mouth 2 (two) times daily. 60 tablet    ATORVASTATIN CALCIUM PO Take 1 tablet by mouth daily  in the afternoon.     buPROPion (WELLBUTRIN XL) 150 MG 24 hr tablet      empagliflozin (JARDIANCE) 10 MG TABS tablet Take 1 tablet (10 mg total) by mouth daily before breakfast. 30 tablet 11   insulin detemir (LEVEMIR) 100 UNIT/ML injection Inject 0.24 mLs (24 Units total) into the skin daily. 10 mL 11   insulin detemir (LEVEMIR) 100 UNIT/ML injection Inject 14 Units  into the skin at bedtime.     metoprolol succinate (TOPROL-XL) 25 MG 24 hr tablet Take 1 tablet (25 mg total) by mouth daily. 60 tablet 1   Nutritional Supplements (ENSURE MAX PROTEIN PO) Take 1 Can by mouth every other day.     Semaglutide (RYBELSUS) 14 MG TABS Take 14 mg by mouth daily.     No current facility-administered medications for this visit.    Allergies:   Sulfa antibiotics   Social History:  The patient  reports that she has never smoked. She has never used smokeless tobacco. She reports that she does not drink alcohol and does not use drugs.   Family History:  The patient's family history includes Diabetes in her mother; Heart attack in her father and mother.   ROS:  Please see the history of present illness.   Otherwise, review of systems is positive for none.   All other systems are reviewed and negative.   PHYSICAL EXAM: VS:  There were no vitals taken for this visit. , BMI There is no height or weight on file to calculate BMI. GEN: Well nourished, well developed, in no acute distress  HEENT: normal  Neck: no JVD, carotid bruits, or masses Cardiac: ***RRR; no murmurs, rubs, or gallops,no edema  Respiratory:  clear to auscultation bilaterally, normal work of breathing GI: soft, nontender, nondistended, + BS MS: no deformity or atrophy  Skin: warm and dry, device site well healed Neuro:  Strength and sensation are intact Psych: euthymic mood, full affect  EKG:  EKG {ACTION; IS/IS [id number]} ordered today. Personal review of the ekg ordered *** shows ***  Personal review of the device interrogation today. Results in [facility]: 10/05/2020: Hemoglobin 11.9; Platelets 244 07/12/2021: B Natriuretic Peptide 44.3; BUN 21; Creatinine, Ser 1.63; Potassium 4.1; Sodium 141    Lipid Panel     Component Value Date/Time   CHOL 170 07/21/2020 0442   TRIG 134 07/21/2020 0442   HDL 51 07/21/2020 0442   CHOLHDL 3.3 07/21/2020 0442   VLDL 27 07/21/2020 0442    LDLCALC 92 07/21/2020 0442     Wt Readings from Last 3 Encounters:  07/12/21 141 lb 6.4 oz (64.1 kg)  03/14/21 151 lb 12.8 oz (68.9 kg)  11/21/20 159 lb 6.4 oz (72.3 kg)      Other studies Reviewed: Additional studies/ records that were reviewed today include: TTE 08/01/20  Review of the above records today demonstrates:   1. Inferior basal septal distal anterior wall hypokinesis consistent with  multi vessel CAD. Left ventricular ejection fraction, by estimation, is 40  to 45%. The left ventricle has mildly decreased function. The left  ventricle demonstrates regional wall  motion abnormalities (see scoring diagram/findings for description). The  left ventricular internal cavity size was moderately dilated. Left  ventricular diastolic parameters are consistent with Grade I diastolic  dysfunction (impaired relaxation).   2. ? catheters in RA/RV . Right ventricular systolic function is normal.  The right ventricular size is normal. There is normal pulmonary artery  systolic pressure.  3. Left atrial size was mildly dilated.   4. Right atrial size was mildly dilated.   5. A small pericardial effusion is present. The pericardial effusion is  lateral to the left ventricle and posterior to the left ventricle.  Moderate pleural effusion in the left lateral region.   6. The mitral valve is abnormal. Mild mitral valve regurgitation. No  evidence of mitral stenosis.   7. Tricuspid valve regurgitation is moderate.   8. The aortic valve is normal in structure. Aortic valve regurgitation is  trivial. No aortic stenosis is present.   9. The inferior vena cava is dilated in size with >50% respiratory  variability, suggesting right atrial pressure of 8 mmHg.    ASSESSMENT AND PLAN:  1.  Complete heart block: Status post Abbott CRT-P implanted 07/27/2020.  Device functioning appropriately.  No changes at this time.  2.  Chronic systolic heart failure due to ischemic cardiomyopathy: Ejection  fraction 40 to 45%.  Currently not on medical therapy per heart failure cardiology.  3.  Coronary artery disease: Status post CABG x5.  No current chest pain.  4.  Paroxysmal atrial fibrillation: Currently on Eliquis 5 mg twice daily.  CHA2DS2-VASc of 5.***  5.  Secondary hypercoagulable state: Currently on Eliquis for atrial fibrillation as above  Current medicines are reviewed at length with the patient today.   The patient does not have concerns regarding her medicines.  The following changes were made today:  ***  Labs/ tests ordered today include:  No orders of the defined types were placed in this encounter.     Disposition:   FU with Bryanna Yim *** months  Signed, Tareek Sabo Meredith Leeds, MD  09/18/2021 10:31 AM     [facility] [address] [place] [address] [phone] (office) [phone] (fax)

## 2021-10-08 NOTE — Progress Notes (Unsigned)
Cardiology Office Note Date:  10/08/2021  Patient ID:  Kristin, [name] 06-25-1940, MRN [id number] PCP:  Barnetta Chapel, NP  Cardiologist:  Dr. Terrence Dupont HF: Dr. Haroldine Laws Electrophysiologist: Dr. [name]  ***refresh   Chief Complaint: *** device opt  History of Present Illness: Kristin [name] is a 81 y.o. female with history of DM, obesity, HTN admitted with NSTEMI > LHC noted critical LM and Multivessel CAD > IABP > emergent CABG 07/22/20 (x5 (LIMA to LAD, SEQ SVG to OM and Diagonal, SEQ to PL and PDA) > post-op CHB > CRT-P, Afib  She comes today to be seen for Dr. [name], last seen by him July 2022 was doing well, less SOB post op/post pacing, no changes were made  Most recently saw Dr. Haroldine Laws March 2023, echo that day 45-50%, abnormal septal motion c/w LBBB, EKG was AS/VP QRS 162. Clinically doing well Referred to EP to see if device can be optimized to improve her dyssynchrony.  Looks like she is pending nephrology consult  *** ekg *** volume *** BP% *** CAD, CM meds *** Afib burden *** eliquis, bleeding *** volume, CorVue, labs  Device information Abbott CRT-P implanted 07/17/20   Past Medical History:  Diagnosis Date   Depression    Diabetes mellitus without complication ([place])    Hypertension    Night sweats     Past Surgical History:  Procedure Laterality Date   BIV PACEMAKER INSERTION CRT-P N/A 07/27/2020   Procedure: BIV PACEMAKER INSERTION CRT-P;  Surgeon: Constance Haw, MD;  Location: [facility];  Service: Cardiovascular;  Laterality: N/A;   CORONARY ARTERY BYPASS GRAFT N/A 07/21/2020   Procedure: CORONARY ARTERY BYPASS GRAFTING (CABG), TIMES FIVE, USING LEFT INTERNAL MAMMARY ARTERY AND ENDOSCOPICALLY HARVESTED BILATERAL GREATER SAPHENOUS VEINS;  Surgeon: [name], MD;  Location: [facility];  Service: Open Heart Surgery;  Laterality: N/A;   ENDOVEIN HARVEST OF GREATER SAPHENOUS VEIN N/A 07/21/2020   Procedure: ENDOVEIN HARVEST OF  BILATERAL GREATER SAPHENOUS VEINS;  Surgeon: [name], MD;  Location: [facility];  Service: Open Heart Surgery;  Laterality: N/A;   EYE SURGERY     GALLBLADDER SURGERY     IABP INSERTION N/A 07/21/2020   Procedure: IABP Insertion;  Surgeon: Charolette Forward, MD;  Location: [facility];  Service: Cardiovascular;  Laterality: N/A;   IR THORACENTESIS ASP PLEURAL SPACE W/IMG GUIDE  08/02/2020   IR THORACENTESIS ASP PLEURAL SPACE W/IMG GUIDE  08/08/2020   LEFT HEART CATH AND CORONARY ANGIOGRAPHY N/A 07/21/2020   Procedure: LEFT HEART CATH AND CORONARY ANGIOGRAPHY;  Surgeon: Charolette Forward, MD;  Location: [facility];  Service: Cardiovascular;  Laterality: N/A;   MEDIASTINAL EXPLORATION N/A 07/22/2020   Procedure: MEDIASTINAL EXPLORATION;  Surgeon: [name], MD;  Location: [facility];  Service: Thoracic;  Laterality: N/A;   TEE WITHOUT CARDIOVERSION N/A 07/21/2020   Procedure: TRANSESOPHAGEAL ECHOCARDIOGRAM (TEE);  Surgeon: [name], MD;  Location: [facility];  Service: Open Heart Surgery;  Laterality: N/A;   TEE WITHOUT CARDIOVERSION N/A 07/22/2020   Procedure: TRANSESOPHAGEAL ECHOCARDIOGRAM (TEE);  Surgeon: [name], MD;  [facility]: [facility];  Service: Thoracic;  Laterality: N/A;   toenail surgery     both big toenails    Current Outpatient Medications  Medication Sig Dispense Refill   acetaminophen (TYLENOL) 325 MG tablet Take 2 tablets (650 mg total) by mouth every 6 (six) hours as needed for mild pain or fever.  apixaban (ELIQUIS) 5 MG TABS tablet Take 1 tablet (5 mg total) by mouth 2 (two) times daily. 60 tablet    ATORVASTATIN CALCIUM PO Take 1 tablet by mouth daily in the afternoon.     buPROPion (WELLBUTRIN XL) 150 MG 24 hr tablet      empagliflozin (JARDIANCE) 10 MG TABS tablet Take 1 tablet (10 mg total) by mouth daily before breakfast. 30 tablet 11   insulin detemir (LEVEMIR) 100 UNIT/ML injection Inject 0.24 mLs (24 Units total) into the skin daily. 10 mL 11    insulin detemir (LEVEMIR) 100 UNIT/ML injection Inject 14 Units into the skin at bedtime.     metoprolol succinate (TOPROL-XL) 25 MG 24 hr tablet Take 1 tablet (25 mg total) by mouth daily. 60 tablet 1   Nutritional Supplements (ENSURE MAX PROTEIN PO) Take 1 Can by mouth every other day.     Semaglutide (RYBELSUS) 14 MG TABS Take 14 mg by mouth daily.     No current facility-administered medications for this visit.    Allergies:   Sulfa antibiotics   Social History:  The patient  reports that she has never smoked. She has never used smokeless tobacco. She reports that she does not drink alcohol and does not use drugs.   Family History:  The patient's family history includes Diabetes in her mother; Heart attack in her father and mother.  ROS:  Please see the history of present illness.    All other systems are reviewed and otherwise negative.   PHYSICAL EXAM:  VS:  There were no vitals taken for this visit. BMI: There is no height or weight on file to calculate BMI. Well nourished, well developed, in no acute distress HEENT: normocephalic, atraumatic Neck: no JVD, carotid bruits or masses Cardiac:  *** RRR; no significant murmurs, no rubs, or gallops Lungs:  *** CTA b/l, no wheezing, rhonchi or rales Abd: soft, nontender MS: no deformity or *** atrophy Ext: *** no edema Skin: warm and dry, no rash Neuro:  No gross deficits appreciated Psych: euthymic mood, full affect  *** PPM site is stable, no tethering or discomfort   EKG:  Done today and reviewed by myself shows  ***  Device interrogation done today and reviewed by myself:  ***   07/12/2021: TTE  1. Left ventricular ejection fraction, by estimation, is 45 to 50%. The  left ventricle has mildly decreased function. The left ventricle has no  regional wall motion abnormalities. There is mild left ventricular  hypertrophy of the basal-septal segment.  Left ventricular diastolic parameters are consistent with Grade I   diastolic dysfunction (impaired relaxation).   2. Right ventricular systolic function is normal. The right ventricular  size is normal. There is normal pulmonary artery systolic pressure.   3. The mitral valve is normal in structure. No evidence of mitral valve  regurgitation. No evidence of mitral stenosis.   4. The aortic valve is normal in structure. Aortic valve regurgitation is  trivial. No aortic stenosis is present.   5. The inferior vena cava is normal in size with greater than 50%  respiratory variability, suggesting right atrial pressure of 3 mmHg.   6. There is significant septal dyssynchrony with wide QRS on tracing  despite presence of CRT device. Consider device optimization.    07/22/20: TEE LEFT VENTRICLE: EF = 40-45%.The LV has marked LVH and is underfilled.There is external compression of the LV from a fluid collection posterior an lateral to the heart.  RIGHT VENTRICLE: Moderate  HK + swan LEFT ATRIUM: Dilated. Mild external compression  LEFT ATRIAL APPENDAGE: Not visualized RIGHT ATRIUM: Dialted AORTIC VALVE:  Trileaflet. No AI/AS MITRAL VALVE:    Normal. Trivial MR TRICUSPID VALVE: Normal. Trivial TR PULMONIC VALVE: Normal INTERATRIAL SEPTUM: Not well visualized PERICARDIUM: Large echodense collection posterior and lateral to heart.  DESCENDING AORTA: Not visualized     07/21/20: LHC Mid LM lesion is 95% stenosed. Ost Cx lesion is 99% stenosed. Dist LM to Ost LAD lesion is 95% stenosed. Mid LAD lesion is 95% stenosed. Prox Cx lesion is 90% stenosed. Prox RCA to Mid RCA lesion is 80% stenosed. RPDA lesion is 80% stenosed. RPAV lesion is 80% stenosed. Ost LM lesion is 30% stenosed.         07/20/20; TTE IMPRESSIONS   1. Left ventricular ejection fraction, by estimation, is 40 to 45%. The  left ventricle has mild to moderately decreased function. The left  ventricle demonstrates regional wall motion abnormalities (see scoring  diagram/findings for  description). Left  ventricular diastolic parameters are consistent with Grade I diastolic  dysfunction (impaired relaxation).   2. Right ventricular systolic function is normal. The right ventricular  size is normal.   3. The mitral valve is normal in structure. Mild mitral valve  regurgitation.   4. The aortic valve is calcified. Aortic valve regurgitation is not  visualized. Mild aortic valve sclerosis is present, with no evidence of  aortic valve stenosis.   5. The inferior vena cava is normal in size with <50% respiratory  variability, suggesting right atrial pressure of 8 mmHg.     Recent Labs: 07/12/2021: B Natriuretic Peptide 44.3; BUN 21; Creatinine, Ser 1.63; Potassium 4.1; Sodium 141  No results found for requested labs within last 365 days.   CrCl cannot be calculated (Patient's most recent lab result is older than the maximum 21 days allowed.).   Wt Readings from Last 3 Encounters:  07/12/21 141 lb 6.4 oz (64.1 kg)  03/14/21 151 lb 12.8 oz (68.9 kg)  11/21/20 159 lb 6.4 oz (72.3 kg)     Other studies reviewed: Additional studies/records reviewed today include: summarized above  ASSESSMENT AND PLAN:  CRT-P ***  CAD ***  ICM Chronic CHF ***  5.  Paroxysmal AFib CHA2DS2Vasc is 7, on Eliquis, *** appropriately dosed *** % burden   Disposition: F/u with ***  Current medicines are reviewed at length with the patient today.  The patient did not have any concerns regarding medicines.  Venetia Night, PA-C 10/08/2021 4:35 PM     [address] [place] [address] [phone] (office)  [phone] (fax)

## 2021-10-10 ENCOUNTER — Ambulatory Visit: Payer: Medicare HMO | Admitting: Physician Assistant

## 2021-10-10 ENCOUNTER — Encounter: Payer: Self-pay | Admitting: Physician Assistant

## 2021-10-10 VITALS — BP 120/70 | HR 68 | Ht 64.0 in | Wt 138.0 lb

## 2021-10-10 DIAGNOSIS — I480 Paroxysmal atrial fibrillation: Secondary | ICD-10-CM

## 2021-10-10 DIAGNOSIS — I2510 Atherosclerotic heart disease of native coronary artery without angina pectoris: Secondary | ICD-10-CM

## 2021-10-10 DIAGNOSIS — I255 Ischemic cardiomyopathy: Secondary | ICD-10-CM | POA: Diagnosis not present

## 2021-10-10 DIAGNOSIS — I5022 Chronic systolic (congestive) heart failure: Secondary | ICD-10-CM | POA: Diagnosis not present

## 2021-10-10 DIAGNOSIS — Z950 Presence of cardiac pacemaker: Secondary | ICD-10-CM | POA: Diagnosis not present

## 2021-10-10 LAB — CUP PACEART INCLINIC DEVICE CHECK
Date Time Interrogation Session: 20230613161654
Implantable Lead Implant Date: 20220330
Implantable Lead Implant Date: 20220330
Implantable Lead Implant Date: 20220330
Implantable Lead Location: 753858
Implantable Lead Location: 753859
Implantable Lead Location: 753860
Implantable Pulse Generator Implant Date: 20220330
Lead Channel Pacing Threshold Amplitude: 0.75 V
Lead Channel Pacing Threshold Amplitude: 1.25 V
Lead Channel Pacing Threshold Amplitude: 2 V
Lead Channel Pacing Threshold Pulse Width: 0.5 ms
Lead Channel Pacing Threshold Pulse Width: 0.5 ms
Lead Channel Pacing Threshold Pulse Width: 1 ms
Lead Channel Sensing Intrinsic Amplitude: 3.3 mV
Pulse Gen Model: 3562
Pulse Gen Serial Number: 3862449

## 2021-10-10 NOTE — Patient Instructions (Signed)
Medication Instructions:  Your physician recommends that you continue on your current medications as directed. Please refer to the Current Medication list given to you today. *If you need a refill on your cardiac medications before your next appointment, please call your pharmacy*   Lab Work: None Ordered   Testing/Procedures: None Ordered   Follow-Up: At [facility], you and your health needs are our priority.  As part of our continuing mission to provide you with exceptional heart care, we have created designated Provider Care Teams.  These Care Teams include your primary Cardiologist (physician) and Advanced Practice Providers (APPs -  Physician Assistants and Nurse Practitioners) who all work together to provide you with the care you need, when you need it.  We recommend signing up for the patient portal called "MyChart".  Sign up information is provided on this After Visit Summary.  MyChart is used to connect with patients for Virtual Visits (Telemedicine).  Patients are able to view lab/test results, encounter notes, upcoming appointments, etc.  Non-urgent messages can be sent to your provider as well.   To learn more about what you can do with MyChart, go to [email or web address].    Your next appointment:   6 month(s)  The format for your next appointment:   In Person  Provider:   Dr Allegra Lai or Tommye Standard, PA    Other Instructions   Important Information About Sugar

## 2021-10-26 ENCOUNTER — Ambulatory Visit (INDEPENDENT_AMBULATORY_CARE_PROVIDER_SITE_OTHER): Payer: Medicare HMO

## 2021-10-26 DIAGNOSIS — I442 Atrioventricular block, complete: Secondary | ICD-10-CM

## 2021-10-26 LAB — CUP PACEART REMOTE DEVICE CHECK
Battery Remaining Longevity: 58 mo
Battery Remaining Percentage: 76 %
Battery Voltage: 2.98 V
Brady Statistic AP VP Percent: 4.6 %
Brady Statistic AP VS Percent: 1 %
Brady Statistic AS VP Percent: 95 %
Brady Statistic AS VS Percent: 1 %
Brady Statistic RA Percent Paced: 4.5 %
Date Time Interrogation Session: 20230629040016
Implantable Lead Implant Date: 20220330
Implantable Lead Implant Date: 20220330
Implantable Lead Implant Date: 20220330
Implantable Lead Location: 753858
Implantable Lead Location: 753859
Implantable Lead Location: 753860
Implantable Pulse Generator Implant Date: 20220330
Lead Channel Impedance Value: 450 Ohm
Lead Channel Impedance Value: 460 Ohm
Lead Channel Impedance Value: 580 Ohm
Lead Channel Pacing Threshold Amplitude: 0.75 V
Lead Channel Pacing Threshold Amplitude: 1.25 V
Lead Channel Pacing Threshold Amplitude: 2 V
Lead Channel Pacing Threshold Pulse Width: 0.5 ms
Lead Channel Pacing Threshold Pulse Width: 0.5 ms
Lead Channel Pacing Threshold Pulse Width: 1 ms
Lead Channel Sensing Intrinsic Amplitude: 12 mV
Lead Channel Sensing Intrinsic Amplitude: 2.1 mV
Lead Channel Setting Pacing Amplitude: 2.5 V
Lead Channel Setting Pacing Amplitude: 2.5 V
Lead Channel Setting Pacing Amplitude: 2.5 V
Lead Channel Setting Pacing Pulse Width: 0.5 ms
Lead Channel Setting Pacing Pulse Width: 1 ms
Lead Channel Setting Sensing Sensitivity: 4 mV
Pulse Gen Model: 3562
Pulse Gen Serial Number: 3862449

## 2021-11-10 NOTE — Progress Notes (Signed)
Remote pacemaker transmission.   

## 2022-01-15 ENCOUNTER — Ambulatory Visit (HOSPITAL_COMMUNITY)
Admission: RE | Admit: 2022-01-15 | Discharge: 2022-01-15 | Disposition: A | Discharge status: Home / Self Care | Payer: Medicare HMO | Source: Ambulatory Visit | Attending: Family Medicine | Admitting: Family Medicine

## 2022-01-15 ENCOUNTER — Encounter (HOSPITAL_COMMUNITY): Payer: Self-pay

## 2022-01-15 VITALS — BP 128/74 | HR 74 | Wt 134.8 lb

## 2022-01-15 DIAGNOSIS — Z951 Presence of aortocoronary bypass graft: Secondary | ICD-10-CM | POA: Diagnosis not present

## 2022-01-15 DIAGNOSIS — I255 Ischemic cardiomyopathy: Secondary | ICD-10-CM | POA: Insufficient documentation

## 2022-01-15 DIAGNOSIS — Z794 Long term (current) use of insulin: Secondary | ICD-10-CM | POA: Insufficient documentation

## 2022-01-15 DIAGNOSIS — I2510 Atherosclerotic heart disease of native coronary artery without angina pectoris: Secondary | ICD-10-CM

## 2022-01-15 DIAGNOSIS — R42 Dizziness and giddiness: Secondary | ICD-10-CM | POA: Insufficient documentation

## 2022-01-15 DIAGNOSIS — R110 Nausea: Secondary | ICD-10-CM | POA: Diagnosis not present

## 2022-01-15 DIAGNOSIS — I252 Old myocardial infarction: Secondary | ICD-10-CM | POA: Diagnosis not present

## 2022-01-15 DIAGNOSIS — I442 Atrioventricular block, complete: Secondary | ICD-10-CM

## 2022-01-15 DIAGNOSIS — E1122 Type 2 diabetes mellitus with diabetic chronic kidney disease: Secondary | ICD-10-CM | POA: Diagnosis not present

## 2022-01-15 DIAGNOSIS — I130 Hypertensive heart and chronic kidney disease with heart failure and stage 1 through stage 4 chronic kidney disease, or unspecified chronic kidney disease: Secondary | ICD-10-CM | POA: Insufficient documentation

## 2022-01-15 DIAGNOSIS — Z7984 Long term (current) use of oral hypoglycemic drugs: Secondary | ICD-10-CM | POA: Insufficient documentation

## 2022-01-15 DIAGNOSIS — Z7901 Long term (current) use of anticoagulants: Secondary | ICD-10-CM | POA: Diagnosis not present

## 2022-01-15 DIAGNOSIS — I480 Paroxysmal atrial fibrillation: Secondary | ICD-10-CM

## 2022-01-15 DIAGNOSIS — I5022 Chronic systolic (congestive) heart failure: Secondary | ICD-10-CM

## 2022-01-15 DIAGNOSIS — Z79899 Other long term (current) drug therapy: Secondary | ICD-10-CM | POA: Diagnosis not present

## 2022-01-15 DIAGNOSIS — K5900 Constipation, unspecified: Secondary | ICD-10-CM | POA: Insufficient documentation

## 2022-01-15 DIAGNOSIS — N183 Chronic kidney disease, stage 3 unspecified: Secondary | ICD-10-CM | POA: Diagnosis not present

## 2022-01-15 DIAGNOSIS — N1832 Chronic kidney disease, stage 3b: Secondary | ICD-10-CM | POA: Insufficient documentation

## 2022-01-15 LAB — BASIC METABOLIC PANEL
Anion gap: 7 (ref 5–15)
BUN: 30 mg/dL — ABNORMAL HIGH (ref 8–23)
CO2: 29 mmol/L (ref 22–32)
Calcium: 8.8 mg/dL — ABNORMAL LOW (ref 8.9–10.3)
Chloride: 104 mmol/L (ref 98–111)
Creatinine, Ser: 1.9 mg/dL — ABNORMAL HIGH (ref 0.44–1.00)
GFR, Estimated: 26 mL/min — ABNORMAL LOW (ref >60)
Glucose, Bld: 224 mg/dL — ABNORMAL HIGH (ref 70–99)
Potassium: 4.2 mmol/L (ref 3.5–5.1)
Sodium: 140 mmol/L (ref 135–145)

## 2022-01-15 LAB — BRAIN NATRIURETIC PEPTIDE: B Natriuretic Peptide: 50.8 pg/mL (ref 0.0–100.0)

## 2022-01-15 NOTE — Progress Notes (Signed)
PCP: Duard Larsen, AGNP Primary Cardiologist: Charolette Forward AHF: Dr. Haroldine Laws.  HPI: Kristin [name] is a 81 y.o.female with PMH significant for T2DM, HTN, Depression, recently diagnosed CAD s/p emergent CABG 2/2 NSTEMI, Systolic CHF, ischemic cardiomyopathy, CHB s/p CRT-P, post op PAF.    No prior cardiac history until 07/20/20 when she presented with chest pain radiating to left arm and shortness of breath, she was in NSR with LBBB, troponin 3,200->3,300 consistent with NSTEMI.  She had a very complicated hospital course.  Her 07/20/20 Echowith EF 40-45% and RWMA. Started on IV heparin, She underwent catheterization on 3/24 and was found to have multivessel CAD with LM involvement.  She required placement of an IABP.  TCTS consulted and agreed she would require emergent coronary bypass grafting. 3/24 underwent emergent CABG x 5 (LIMA to LAD, SEQ SVG to OM and Diagonal, SEQ to PL and PDA).  After surgery patient persistently hypotensive despite inotropic and vasopressor support.  07/22/20 TEE performed and consistent with tamponade.  Taken back to OR for tamponade for mediastinal exploration and evacuation. 2 discrete bleeding points were seen, one on the IMA graft and one on the vein graft to the right coronary artery distribution, both repaired. EF on TEE 55%, RV mildly down.  Post takeback still somewhat hypotensive required continued milrinone support.  Difficulty with adding GDMT milrinone stopped and midodrine added 07/25/20.  She also developed post op CHB and was being paced by epicardial wires with no improvement in conduction therefore CRT-P was placed on 07/27/20.   Echo 08/02/20 with EF 40-45%, moderate TR, G1DD, Inferior basal septal distal anterior wall hypokinesis.    Echo 3/23 showed EF 45-50%, RV normal, abnormal (paradoxical) septal motion, consistent with left bundle branch block.   Today she returns for HF follow up with her daughter. Overall feeling fine. She has some dizziness and  continues with on-going nausea; vomited yesterday. She is constipated and has heartburn occasionally. She is not SOB with activity. Denies palpitations, abnormal bleeding, CP,  edema, or PND/Orthopnea. Appetite ok. No fever or chills. Weight at home 130-132 pounds. Taking all medications. Does not want to be referred to Nephrology.   Cardiac Studies: - Echo (3/23): EF 45-50%, RV normal, abnormal (paradoxical) septal motion, consistent with left bundle branch block  - Echo (4/22): EF 40-45%, Mod TR, G1DD, Inferior basal septal distal anterior wall hypokinesis.    - Echo (3/22): EF 40-45%  ROS: All systems reviewed and negative except as per HPI.   Past Medical History:  Diagnosis Date   Depression    Diabetes mellitus without complication (HCC)    Hypertension    Night sweats     Current Outpatient Medications  Medication Sig Dispense Refill   acetaminophen (TYLENOL) 325 MG tablet Take 2 tablets (650 mg total) by mouth every 6 (six) hours as needed for mild pain or fever.     apixaban (ELIQUIS) 5 MG TABS tablet Take 1 tablet (5 mg total) by mouth 2 (two) times daily. 60 tablet    atorvastatin (LIPITOR) 80 MG tablet Take 80 mg by mouth daily.     buPROPion (WELLBUTRIN XL) 150 MG 24 hr tablet      insulin detemir (LEVEMIR) 100 UNIT/ML injection Inject 0.24 mLs (24 Units total) into the skin daily. (Patient taking differently: Inject 40 Units into the skin daily. Per patient on 10/10/21) 10 mL 11   JARDIANCE 25 MG TABS tablet Take 25 mg by mouth daily.     metoprolol succinate (  TOPROL-XL) 25 MG 24 hr tablet Take 1 tablet (25 mg total) by mouth daily. 60 tablet 1   Nutritional Supplements (ENSURE MAX PROTEIN PO) Take 1 Can by mouth every other day.     nitroGLYCERIN (NITROSTAT) 0.4 MG SL tablet See admin instructions. (Patient not taking: Reported on 01/15/2022)     Semaglutide (RYBELSUS) 14 MG TABS Take 14 mg by mouth daily. (Patient not taking: Reported on 01/15/2022)     No current  facility-administered medications for this encounter.   Allergies  Allergen Reactions   Sulfa Antibiotics Hives   Social History   Socioeconomic History   Marital status: Widowed    Spouse name: Not on file   Number of children: Not on file   Years of education: Not on file   Highest education level: Not on file  Occupational History   Occupation: retired   Tobacco Use   Smoking status: Never   Smokeless tobacco: Never  Vaping Use   Vaping Use: Never used  Substance and Sexual Activity   Alcohol use: No   Drug use: No   Sexual activity: Not on file  Other Topics Concern   Not on file  Social History Narrative   Not on file   Social Determinants of Health   Financial Resource Strain: Low Risk  (08/10/2020)   Overall Financial Resource Strain (CARDIA)    Difficulty of Paying Living Expenses: Not very hard  Food Insecurity: Not on file  Transportation Needs: No Transportation Needs (08/10/2020)   PRAPARE - Hydrologist (Medical): No    Lack of Transportation (Non-Medical): No  Physical Activity: Not on file  Stress: Not on file  Social Connections: Not on file  Intimate Partner Violence: Not on file   Family History  Problem Relation Age of Onset   Diabetes Mother    Heart attack Mother    Heart attack Father    BP 128/74   Pulse 74   Wt 61.1 kg (134 lb 12.8 oz)   SpO2 98%   BMI 23.14 kg/m   Wt Readings from Last 3 Encounters:  01/15/22 61.1 kg (134 lb 12.8 oz)  10/10/21 62.6 kg (138 lb)  07/12/21 64.1 kg (141 lb 6.4 oz)   PHYSICAL EXAM: General:  NAD. No resp difficulty, elderly, walked into clinic. HEENT: Normal Neck: Supple. No JVD. Carotids 2+ bilat; no bruits. No lymphadenopathy or thryomegaly appreciated. Cor: PMI nondisplaced. Regular rate & rhythm. No rubs, gallops or murmurs. Lungs: Clear Abdomen: Soft, nontender, nondistended. No hepatosplenomegaly. No bruits or masses. Good bowel sounds. Extremities: No cyanosis,  clubbing, rash, edema Neuro: Alert & oriented x 3, cranial nerves grossly intact. Moves all 4 extremities w/o difficulty. Affect pleasant.  Device interrogation: Unable to interrogate device in clinic  ECG (personally reviewed): v-paced, 75 bpm; QRS 118 msec  ASSESSMENT & PLAN: 1. Chronic Systolic CHF, Ischemic Cardiomyopathy - S/p CABG x5 in 3/22 - LVEF 55% on post-op TEE 3/25-->40-45% on TTE 08/01/20 - Echo (3/23): EF 45-50%, RV normal. Abnormal (paradoxical) septal motion, consistent with LBBB - NYHA II. Volume status ok.  Does not need loops. - Hold off on spiro with CKD 3-4 and recent dizziness/nausea. - Continue Jardiance. ? If causing nausea. Can try 1/2 tab to see if nausea improves. Has PCP follow up. - Continue Toprol XL 25 mg daily.  - Did not tolerate losartan due to dizziness. - Labs today.    2. CAD s/p NSTEMI  - CABG x 5  in 3/22 as detailed above - No s/s ischemia. - No ASA given Eliquis.  - Continue statin + beta blocker.  3. CKD 3b-IV - Baseline SCR 1.6-1.8 - Continue SGLT2i. - Declines referral to Nephrology. We discussed this again today. - Labs today.   4. CHB - S/P CRT-P- 07/27/20  - Followed in [facility]. - Septal dyssynchrony noted on echo 3/23.  - QRS on ECG today 118 msec - Referred back to EP to discuss device optimization. Unclear if she is a candidate.   5. PAF - Had several recurrent episodes of Afib on the monitor on 4/7 and 4/9. - CHA2DS2-VASc of 5. - Continue apixaban 5 mg bid. No bleeding issues. - EKG shows A-v paced rhythm   6. DM2 - PCP managing. She has stopped her Rybelsus. - Continue SGLT2i.  Follow up in 6 months with Dr. Haroldine Laws  Rafael Bihari, FNP  2:41 PM

## 2022-01-15 NOTE — Patient Instructions (Addendum)
Thank you for coming in today  Labs were done today, if any labs are abnormal the clinic will call you No news is good news  Try 1/2 tablet of your Jardiance to 12.5 mg to see if your nausea improves   Your physician recommends that you schedule a follow-up appointment in:  6 months with Dr. Haroldine Laws please call January 2024 for March 2024 appointment    Do the following things EVERYDAY: Weigh yourself in the morning before breakfast. Write it down and keep it in a log. Take your medicines as prescribed Eat low salt foods--Limit salt (sodium) to 2000 mg per day.  Stay as active as you can everyday Limit all fluids for the day to less than 2 liters  At the [facility], you and your health needs are our priority. As part of our continuing mission to provide you with exceptional heart care, we have created designated Provider Care Teams. These Care Teams include your primary Cardiologist (physician) and Advanced Practice Providers (APPs- Physician Assistants and Nurse Practitioners) who all work together to provide you with the care you need, when you need it.   You may see any of the following providers on your designated Care Team at your next follow up: Dr [name] Dr Loralie Champagne Dr. Roxana Hires, NP Lyda Jester, [place] [facility] [place], [place] Forestine Na, NP Audry Riles, PharmD   Please be sure to bring in all your medications bottles to every appointment.   If you have any questions or concerns before your next appointment please send [place] a message through [place] or call our office at [phone].    TO LEAVE A MESSAGE FOR THE NURSE SELECT OPTION 2, PLEASE LEAVE A MESSAGE INCLUDING: YOUR NAME DATE OF BIRTH CALL BACK NUMBER REASON FOR CALL**this is important as we prioritize the call backs  YOU WILL RECEIVE A CALL BACK THE SAME DAY AS LONG AS YOU CALL BEFORE 4:00 PM

## 2022-01-25 ENCOUNTER — Ambulatory Visit (INDEPENDENT_AMBULATORY_CARE_PROVIDER_SITE_OTHER): Payer: Medicare HMO

## 2022-01-25 DIAGNOSIS — I442 Atrioventricular block, complete: Secondary | ICD-10-CM | POA: Diagnosis not present

## 2022-01-26 LAB — CUP PACEART REMOTE DEVICE CHECK
Battery Remaining Longevity: 43 mo
Battery Remaining Percentage: 72 %
Battery Voltage: 2.96 V
Brady Statistic AP VP Percent: 5.7 %
Brady Statistic AP VS Percent: 1 %
Brady Statistic AS VP Percent: 94 %
Brady Statistic AS VS Percent: 1 %
Brady Statistic RA Percent Paced: 5.6 %
Date Time Interrogation Session: 20230928040011
Implantable Lead Implant Date: 20220330
Implantable Lead Implant Date: 20220330
Implantable Lead Implant Date: 20220330
Implantable Lead Location: 753858
Implantable Lead Location: 753859
Implantable Lead Location: 753860
Implantable Pulse Generator Implant Date: 20220330
Lead Channel Impedance Value: 460 Ohm
Lead Channel Impedance Value: 530 Ohm
Lead Channel Impedance Value: 630 Ohm
Lead Channel Pacing Threshold Amplitude: 0.75 V
Lead Channel Pacing Threshold Amplitude: 1.25 V
Lead Channel Pacing Threshold Amplitude: 2.25 V
Lead Channel Pacing Threshold Pulse Width: 0.5 ms
Lead Channel Pacing Threshold Pulse Width: 0.5 ms
Lead Channel Pacing Threshold Pulse Width: 1 ms
Lead Channel Sensing Intrinsic Amplitude: 12 mV
Lead Channel Sensing Intrinsic Amplitude: 2.8 mV
Lead Channel Setting Pacing Amplitude: 2.5 V
Lead Channel Setting Pacing Amplitude: 2.5 V
Lead Channel Setting Pacing Amplitude: 2.75 V
Lead Channel Setting Pacing Pulse Width: 0.5 ms
Lead Channel Setting Pacing Pulse Width: 1 ms
Lead Channel Setting Sensing Sensitivity: 4 mV
Pulse Gen Model: 3562
Pulse Gen Serial Number: 3862449

## 2022-01-31 NOTE — Progress Notes (Signed)
Remote pacemaker transmission.   

## 2022-04-10 ENCOUNTER — Encounter: Payer: Self-pay | Admitting: Cardiology

## 2022-04-10 ENCOUNTER — Ambulatory Visit: Payer: Medicare HMO | Attending: Cardiology | Admitting: Cardiology

## 2022-04-10 VITALS — BP 126/82 | HR 74 | Ht 64.0 in | Wt 135.2 lb

## 2022-04-10 DIAGNOSIS — I442 Atrioventricular block, complete: Secondary | ICD-10-CM

## 2022-04-10 DIAGNOSIS — I480 Paroxysmal atrial fibrillation: Secondary | ICD-10-CM | POA: Diagnosis not present

## 2022-04-10 DIAGNOSIS — D6869 Other thrombophilia: Secondary | ICD-10-CM

## 2022-04-10 NOTE — Progress Notes (Signed)
Electrophysiology Office Note   Date:  04/10/2022   ID:  Kristin [name] 03-17-1941, MRN [id number]  PCP:  Barnetta Chapel, NP  Cardiologist:  Jennings Books Primary Electrophysiologist:  Ian Castagna Meredith Leeds, MD    Chief Complaint: pacemaker   History of Present Illness: Kristin [name] is a 81 y.o. female who is being seen today for the evaluation of pacemaker at the request of Barnetta Chapel, NP. Presenting today for electrophysiology evaluation.  He has a history seen for diabetes, obesity, hypertension.  She was admitted to the hospital with a non-STEMI and was found to have left main stenosis and multivessel coronary artery disease.  She had emergent CABG three 2522 x 5.  Postoperatively she had complete heart block and is post Abbott CRT-P implanted 07/27/2020.  Today, denies symptoms of palpitations, chest pain, shortness of breath, orthopnea, PND, lower extremity edema, claudication, dizziness, presyncope, syncope, bleeding, or neurologic sequela. The patient is tolerating medications without difficulties.  Since being seen she has done well.  She does get dizzy when she stands up, and has discussed this with heart failure cardiology.  Hydromet, she has no major complaints.   Past Medical History:  Diagnosis Date   Depression    Diabetes mellitus without complication ([place])    Hypertension    Night sweats    Past Surgical History:  Procedure Laterality Date   BIV PACEMAKER INSERTION CRT-P N/A 07/27/2020   Procedure: BIV PACEMAKER INSERTION CRT-P;  Surgeon: Constance Haw, MD;  Location: [facility];  Service: Cardiovascular;  Laterality: N/A;   CORONARY ARTERY BYPASS GRAFT N/A 07/21/2020   Procedure: CORONARY ARTERY BYPASS GRAFTING (CABG), TIMES FIVE, USING LEFT INTERNAL MAMMARY ARTERY AND ENDOSCOPICALLY HARVESTED BILATERAL GREATER SAPHENOUS VEINS;  Surgeon: [name], MD;  Location: [facility];  Service: Open Heart Surgery;  Laterality: N/A;    ENDOVEIN HARVEST OF GREATER SAPHENOUS VEIN N/A 07/21/2020   Procedure: ENDOVEIN HARVEST OF BILATERAL GREATER SAPHENOUS VEINS;  Surgeon: [name], MD;  Location: [facility];  Service: Open Heart Surgery;  Laterality: N/A;   EYE SURGERY     GALLBLADDER SURGERY     IABP INSERTION N/A 07/21/2020   Procedure: IABP Insertion;  Surgeon: Charolette Forward, MD;  Location: [facility];  Service: Cardiovascular;  Laterality: N/A;   IR THORACENTESIS ASP PLEURAL SPACE W/IMG GUIDE  08/02/2020   IR THORACENTESIS ASP PLEURAL SPACE W/IMG GUIDE  08/08/2020   LEFT HEART CATH AND CORONARY ANGIOGRAPHY N/A 07/21/2020   Procedure: LEFT HEART CATH AND CORONARY ANGIOGRAPHY;  Surgeon: Charolette Forward, MD;  Location: [facility];  Service: Cardiovascular;  Laterality: N/A;   MEDIASTINAL EXPLORATION N/A 07/22/2020   Procedure: MEDIASTINAL EXPLORATION;  Surgeon: [name], MD;  Location: [facility];  Service: Thoracic;  Laterality: N/A;   TEE WITHOUT CARDIOVERSION N/A 07/21/2020   Procedure: TRANSESOPHAGEAL ECHOCARDIOGRAM (TEE);  Surgeon: [name], MD;  Location: [facility];  Service: Open Heart Surgery;  Laterality: N/A;   TEE WITHOUT CARDIOVERSION N/A 07/22/2020   Procedure: TRANSESOPHAGEAL ECHOCARDIOGRAM (TEE);  Surgeon: [name], MD;  Location: [facility];  Service: Thoracic;  Laterality: N/A;   toenail surgery     both big toenails     Current Outpatient Medications  Medication Sig Dispense Refill   acetaminophen (TYLENOL) 325 MG tablet Take 2 tablets (650 mg total) by mouth every 6 (six) hours as needed for mild pain or fever.     apixaban (ELIQUIS) 5  MG TABS tablet Take 1 tablet (5 mg total) by mouth 2 (two) times daily. 60 tablet    atorvastatin (LIPITOR) 80 MG tablet Take 80 mg by mouth daily.     buPROPion (WELLBUTRIN XL) 150 MG 24 hr tablet      gabapentin (NEURONTIN) 100 MG capsule Take 100 mg by mouth at bedtime.     insulin detemir (LEVEMIR) 100 UNIT/ML injection Inject 0.24 mLs (24  Units total) into the skin daily. (Patient taking differently: Inject 40 Units into the skin daily. Per patient on 10/10/21) 10 mL 11   JARDIANCE 25 MG TABS tablet Take 25 mg by mouth daily.     metoprolol succinate (TOPROL-XL) 25 MG 24 hr tablet Take 1 tablet (25 mg total) by mouth daily. 60 tablet 1   nitroGLYCERIN (NITROSTAT) 0.4 MG SL tablet See admin instructions.     Nutritional Supplements (ENSURE MAX PROTEIN PO) Take 1 Can by mouth every other day.     Semaglutide (RYBELSUS) 14 MG TABS Take 14 mg by mouth daily.     No current facility-administered medications for this visit.    Allergies:   Sulfa antibiotics   Social History:  The patient  reports that she has never smoked. She has never used smokeless tobacco. She reports that she does not drink alcohol and does not use drugs.   Family History:  The patient's family history includes Diabetes in her mother; Heart attack in her father and mother.    ROS:  Please see the history of present illness.   Otherwise, review of systems is positive for none.   All other systems are reviewed and negative.   PHYSICAL EXAM: VS:  BP 126/82   Pulse 74   Ht 5\' 4"  (1.626 m)   Wt 135 lb 3.2 oz (61.3 kg)   SpO2 99%   BMI 23.21 kg/m  , BMI Body mass index is 23.21 kg/m. GEN: Well nourished, well developed, in no acute distress  HEENT: normal  Neck: no JVD, carotid bruits, or masses Cardiac: RRR; no murmurs, rubs, or gallops,no edema  Respiratory:  clear to auscultation bilaterally, normal work of breathing GI: soft, nontender, nondistended, + BS MS: no deformity or atrophy  Skin: warm and dry, device site well healed Neuro:  Strength and sensation are intact Psych: euthymic mood, full affect  EKG:  EKG is not ordered today. Personal review of the ekg ordered 01/15/22 shows A sense, V pace  Personal review of the device interrogation today. Results in [place]: 01/15/2022: B Natriuretic Peptide 50.8; BUN 30; Creatinine,  Ser 1.90; Potassium 4.2; Sodium 140    Lipid Panel     Component Value Date/Time   CHOL 170 07/21/2020 0442   TRIG 134 07/21/2020 0442   HDL 51 07/21/2020 0442   CHOLHDL 3.3 07/21/2020 0442   VLDL 27 07/21/2020 0442   LDLCALC 92 07/21/2020 0442     Wt Readings from Last 3 Encounters:  04/10/22 135 lb 3.2 oz (61.3 kg)  01/15/22 134 lb 12.8 oz (61.1 kg)  10/10/21 138 lb (62.6 kg)      Other studies Reviewed: Additional studies/ records that were reviewed today include: TTE 08/01/20  Review of the above records today demonstrates:   1. Inferior basal septal distal anterior wall hypokinesis consistent with  multi vessel CAD. Left ventricular ejection fraction, by estimation, is 40  to 45%. The left ventricle has mildly decreased function. The left  ventricle demonstrates regional wall  motion  abnormalities (see scoring diagram/findings for description). The  left ventricular internal cavity size was moderately dilated. Left  ventricular diastolic parameters are consistent with Grade I diastolic  dysfunction (impaired relaxation).   2. ? catheters in RA/RV . Right ventricular systolic function is normal.  The right ventricular size is normal. There is normal pulmonary artery  systolic pressure.   3. Left atrial size was mildly dilated.   4. Right atrial size was mildly dilated.   5. A small pericardial effusion is present. The pericardial effusion is  lateral to the left ventricle and posterior to the left ventricle.  Moderate pleural effusion in the left lateral region.   6. The mitral valve is abnormal. Mild mitral valve regurgitation. No  evidence of mitral stenosis.   7. Tricuspid valve regurgitation is moderate.   8. The aortic valve is normal in structure. Aortic valve regurgitation is  trivial. No aortic stenosis is present.   9. The inferior vena cava is dilated in size with >50% respiratory  variability, suggesting right atrial pressure of 8 mmHg.    ASSESSMENT AND  PLAN:  1.  Complete heart block: Status post Abbott CRT-P implanted 07/27/2020.  Device functioning properly.  No changes at this time.  2.  Chronic systolic heart failure: Due to ischemic cardiomyopathy.  Ejection fraction 40 to 45%.  Currently off medical therapy per heart failure cardiology as above.  3.  Coronary artery disease: Status post CABG x 5.  No current chest pain  4.  Paroxysmal atrial fibrillation: CHA2DS2-VASc of 5.  Currently on Eliquis.  In sinus rhythm  5.  Secondary hypercoagulable state: Currently on Eliquis for atrial fibrillation as above   Current medicines are reviewed at length with the patient today.   The patient does not have concerns regarding her medicines.  The following changes were made today: None  Labs/ tests ordered today include:  No orders of the defined types were placed in this encounter.     Disposition:   FU 12 months  Signed, Avyukt Cimo Meredith Leeds, MD  04/10/2022 3:20 PM     [facility] [address] [place] [address] [phone] (office) [phone] (fax)

## 2022-04-26 ENCOUNTER — Ambulatory Visit (INDEPENDENT_AMBULATORY_CARE_PROVIDER_SITE_OTHER): Payer: Medicare HMO

## 2022-04-26 DIAGNOSIS — I442 Atrioventricular block, complete: Secondary | ICD-10-CM | POA: Diagnosis not present

## 2022-04-26 LAB — CUP PACEART REMOTE DEVICE CHECK
Battery Remaining Longevity: 40 mo
Battery Remaining Percentage: 67 %
Battery Voltage: 2.96 V
Brady Statistic AP VP Percent: 1 %
Brady Statistic AP VS Percent: 1 %
Brady Statistic AS VP Percent: 99 %
Brady Statistic AS VS Percent: 1 %
Brady Statistic RA Percent Paced: 1 %
Date Time Interrogation Session: 20231228040010
Implantable Lead Connection Status: 753985
Implantable Lead Connection Status: 753985
Implantable Lead Connection Status: 753985
Implantable Lead Implant Date: 20220330
Implantable Lead Implant Date: 20220330
Implantable Lead Implant Date: 20220330
Implantable Lead Location: 753858
Implantable Lead Location: 753859
Implantable Lead Location: 753860
Implantable Pulse Generator Implant Date: 20220330
Lead Channel Impedance Value: 450 Ohm
Lead Channel Impedance Value: 490 Ohm
Lead Channel Impedance Value: 630 Ohm
Lead Channel Pacing Threshold Amplitude: 0.75 V
Lead Channel Pacing Threshold Amplitude: 1.25 V
Lead Channel Pacing Threshold Amplitude: 2.375 V
Lead Channel Pacing Threshold Pulse Width: 0.5 ms
Lead Channel Pacing Threshold Pulse Width: 0.5 ms
Lead Channel Pacing Threshold Pulse Width: 1 ms
Lead Channel Sensing Intrinsic Amplitude: 12 mV
Lead Channel Sensing Intrinsic Amplitude: 2.7 mV
Lead Channel Setting Pacing Amplitude: 2 V
Lead Channel Setting Pacing Amplitude: 2.5 V
Lead Channel Setting Pacing Amplitude: 2.875
Lead Channel Setting Pacing Pulse Width: 0.5 ms
Lead Channel Setting Pacing Pulse Width: 1 ms
Lead Channel Setting Sensing Sensitivity: 4 mV
Pulse Gen Model: 3562
Pulse Gen Serial Number: 3862449

## 2022-05-15 NOTE — Progress Notes (Signed)
Remote pacemaker transmission.   

## 2022-07-23 ENCOUNTER — Telehealth (HOSPITAL_COMMUNITY): Payer: Self-pay

## 2022-07-23 ENCOUNTER — Encounter (HOSPITAL_COMMUNITY): Payer: Self-pay | Admitting: Internal Medicine

## 2022-07-23 ENCOUNTER — Ambulatory Visit (HOSPITAL_COMMUNITY)
Admission: RE | Admit: 2022-07-23 | Discharge: 2022-07-23 | Disposition: A | Discharge status: Home / Self Care | Payer: Medicare HMO | Source: Ambulatory Visit | Attending: Internal Medicine | Admitting: Internal Medicine

## 2022-07-23 VITALS — BP 108/56 | HR 95 | Wt 139.2 lb

## 2022-07-23 DIAGNOSIS — I252 Old myocardial infarction: Secondary | ICD-10-CM | POA: Diagnosis not present

## 2022-07-23 DIAGNOSIS — I130 Hypertensive heart and chronic kidney disease with heart failure and stage 1 through stage 4 chronic kidney disease, or unspecified chronic kidney disease: Secondary | ICD-10-CM | POA: Insufficient documentation

## 2022-07-23 DIAGNOSIS — F32A Depression, unspecified: Secondary | ICD-10-CM | POA: Diagnosis not present

## 2022-07-23 DIAGNOSIS — I442 Atrioventricular block, complete: Secondary | ICD-10-CM | POA: Insufficient documentation

## 2022-07-23 DIAGNOSIS — Z950 Presence of cardiac pacemaker: Secondary | ICD-10-CM | POA: Diagnosis not present

## 2022-07-23 DIAGNOSIS — N184 Chronic kidney disease, stage 4 (severe): Secondary | ICD-10-CM | POA: Diagnosis not present

## 2022-07-23 DIAGNOSIS — I255 Ischemic cardiomyopathy: Secondary | ICD-10-CM | POA: Insufficient documentation

## 2022-07-23 DIAGNOSIS — Z7901 Long term (current) use of anticoagulants: Secondary | ICD-10-CM | POA: Insufficient documentation

## 2022-07-23 DIAGNOSIS — I480 Paroxysmal atrial fibrillation: Secondary | ICD-10-CM | POA: Insufficient documentation

## 2022-07-23 DIAGNOSIS — E1122 Type 2 diabetes mellitus with diabetic chronic kidney disease: Secondary | ICD-10-CM | POA: Insufficient documentation

## 2022-07-23 DIAGNOSIS — N1832 Chronic kidney disease, stage 3b: Secondary | ICD-10-CM | POA: Insufficient documentation

## 2022-07-23 DIAGNOSIS — R270 Ataxia, unspecified: Secondary | ICD-10-CM | POA: Insufficient documentation

## 2022-07-23 DIAGNOSIS — I5022 Chronic systolic (congestive) heart failure: Secondary | ICD-10-CM | POA: Insufficient documentation

## 2022-07-23 DIAGNOSIS — Z951 Presence of aortocoronary bypass graft: Secondary | ICD-10-CM | POA: Diagnosis not present

## 2022-07-23 DIAGNOSIS — Z7985 Long-term (current) use of injectable non-insulin antidiabetic drugs: Secondary | ICD-10-CM | POA: Insufficient documentation

## 2022-07-23 DIAGNOSIS — Z794 Long term (current) use of insulin: Secondary | ICD-10-CM | POA: Diagnosis not present

## 2022-07-23 DIAGNOSIS — Z79899 Other long term (current) drug therapy: Secondary | ICD-10-CM | POA: Insufficient documentation

## 2022-07-23 DIAGNOSIS — I2510 Atherosclerotic heart disease of native coronary artery without angina pectoris: Secondary | ICD-10-CM | POA: Diagnosis not present

## 2022-07-23 NOTE — Patient Instructions (Signed)
There has been no changes to your medications.  You have been referred to Neurology. They will call you to arrange your appointment.  You have been referred to Physical therapy in [place]. They will call you to arrange your appointment.  Your physician recommends that you schedule a follow-up appointment in: 9 months (November) ** please call the office in September to arrange your follow up appointment. **  If you have any questions or concerns before your next appointment please send [place] a message through [place] or call our office at [phone].    TO LEAVE A MESSAGE FOR THE NURSE SELECT OPTION 2, PLEASE LEAVE A MESSAGE INCLUDING: YOUR NAME DATE OF BIRTH CALL BACK NUMBER REASON FOR CALL**this is important as we prioritize the call backs  YOU WILL RECEIVE A CALL BACK THE SAME DAY AS LONG AS YOU CALL BEFORE 4:00 PM  At the [facility], you and your health needs are our priority. As part of our continuing mission to provide you with exceptional heart care, we have created designated Provider Care Teams. These Care Teams include your primary Cardiologist (physician) and Advanced Practice Providers (APPs- Physician Assistants and Nurse Practitioners) who all work together to provide you with the care you need, when you need it.   You may see any of the following providers on your designated Care Team at your next follow up: Dr [name] Dr Loralie Champagne Dr. Roxana Hires, NP Lyda Jester, [place] [facility] [place], [place] Forestine Na, NP Audry Riles, PharmD   Please be sure to bring in all your medications bottles to every appointment.    Thank you for choosing [facility]

## 2022-07-23 NOTE — Progress Notes (Signed)
PCP: Duard Larsen, AGNP Primary Cardiologist: Charolette Forward AHF: Dr. Haroldine Laws.  HPI: Kristin [name] is a 82 y.o.female with DM2, HTN, depression, PAF, CAD s/p emergent CABG [id number], systolic HF due to iCM ischemic cardiomyopathy with CHB s/p CRT-P.   No prior cardiac history until 3/22  admitted with NSTEMI & LBBB.  Echo EF 40-45%. Cath mvCAD with LM  > IABP.  TCTS took for emergent CABG x 5 (LIMA to LAD, Seq VG to OM and Diagonal, SEQ to PL and PDA). Post-op tamponade. Taken back to OR for repair of RCA and IMA grafts.  BP remained low. Developed post op CHB -> STJ CRT-P placed.  Echo 08/02/20 EF 40-45%, mod TR, G1DD, Inferior basal septal distal anterior wall hypokinesis.  Echo 3/23 showed EF 45-50%, RV normal, abnormal (paradoxical) septal motion, consistent with left bundle branch block.   At last visit referred back to [facility]) to see if CRT could be optimized. Device reprogrammed 6/23.   Today she returns for HF follow up with her daughter. Overall doing fairly well but tires quickly. Feels "stumbly" when walking but denies frank orthostasis. Has lost 50 pounds with Rybelsus. No CP, edema, orthopnea or PND.   Cardiac Studies: - Echo (3/23): EF 45-50%, RV normal, abnormal (paradoxical) septal motion, consistent with left bundle branch block  - Echo (4/22): EF 40-45%, Mod TR, G1DD, Inferior basal septal distal anterior wall hypokinesis.    - Echo (3/22): EF 40-45%  ROS: All systems reviewed and negative except as per HPI.   Past Medical History:  Diagnosis Date   Depression    Diabetes mellitus without complication (H[facility])    Hypertension    Night sweats     Current Outpatient Medications  Medication Sig Dispense Refill   acetaminophen (TYLENOL) 325 MG tablet Take 2 tablets (650 mg total) by mouth every 6 (six) hours as needed for mild pain or fever.     apixaban (ELIQUIS) 5 MG TABS tablet Take 1 tablet (5 mg total) by mouth 2 (two) times daily. 60 tablet    atorvastatin  (LIPITOR) 80 MG tablet Take 80 mg by mouth daily.     buPROPion (WELLBUTRIN XL) 150 MG 24 hr tablet 150 mg daily.     insulin detemir (LEVEMIR) 100 UNIT/ML injection Inject 0.24 mLs (24 Units total) into the skin daily. (Patient taking differently: Inject 40 Units into the skin daily. Per patient on 10/10/21) 10 mL 11   JARDIANCE 25 MG TABS tablet Take 25 mg by mouth daily.     metoprolol succinate (TOPROL-XL) 25 MG 24 hr tablet Take 1 tablet (25 mg total) by mouth daily. 60 tablet 1   nitroGLYCERIN (NITROSTAT) 0.4 MG SL tablet See admin instructions.     Nutritional Supplements (ENSURE MAX PROTEIN PO) Take 1 Can by mouth every other day.     Semaglutide (RYBELSUS) 14 MG TABS Take 14 mg by mouth daily.     No current facility-administered medications for this encounter.   Allergies  Allergen Reactions   Sulfa Antibiotics Hives   Social History   Socioeconomic History   Marital status: Widowed    Spouse name: Not on file   Number of children: Not on file   Years of education: Not on file   Highest education level: Not on file  Occupational History   Occupation: retired   Tobacco Use   Smoking status: Never   Smokeless tobacco: Never  Vaping Use   Vaping Use: Never used  Substance and  Sexual Activity   Alcohol use: No   Drug use: No   Sexual activity: Not on file  Other Topics Concern   Not on file  Social History Narrative   Not on file   Social Determinants of Health   Financial Resource Strain: Low Risk  (08/10/2020)   Overall Financial Resource Strain (CARDIA)    Difficulty of Paying Living Expenses: Not very hard  Food Insecurity: Not on file  Transportation Needs: No Transportation Needs (08/10/2020)   PRAPARE - Hydrologist (Medical): No    Lack of Transportation (Non-Medical): No  Physical Activity: Not on file  Stress: Not on file  Social Connections: Not on file  Intimate Partner Violence: Not on file   Family History  Problem  Relation Age of Onset   Diabetes Mother    Heart attack Mother    Heart attack Father    BP (!) 108/56   Pulse 95   Wt 63.1 kg (139 lb 3.2 oz)   SpO2 97%   BMI 23.89 kg/m   Wt Readings from Last 3 Encounters:  07/23/22 63.1 kg (139 lb 3.2 oz)  04/10/22 61.3 kg (135 lb 3.2 oz)  01/15/22 61.1 kg (134 lb 12.8 oz)   PHYSICAL EXAM: General:  Well appearing. No resp difficulty HEENT: normal Neck: supple. no JVD. Carotids 2+ bilat; no bruits. No lymphadenopathy or thryomegaly appreciated. Cor: PMI nondisplaced. Regular rate & rhythm. No rubs, gallops or murmurs. Lungs: clear Abdomen: soft, nontender, nondistended. No hepatosplenomegaly. No bruits or masses. Good bowel sounds. Extremities: no cyanosis, clubbing, rash, edema Neuro: alert & orientedx3, moves all 4 extremities w/o difficulty. Affect pleasant + Romberg (with near fall) Unable to tandem gait   Device interrogation: Unable to interrogate device in clinic   ASSESSMENT & PLAN:  1. Chronic Systolic CHF, Ischemic Cardiomyopathy - S/p CABG x5 in 3/22 - LVEF 55% on post-op TEE 3/25-->40-45% on TTE 08/01/20 - Echo (3/23): EF 45-50%, RV normal. Abnormal (paradoxical) septal motion, consistent with LBBB - Stable NYHA II but limited by imbalance. Has significant ataxia on exam - Hold off on spiro with CKD 3-4 and recent dizziness/nausea. - Continue Jardiance. - Continue Toprol XL 25 mg daily.  - Did not tolerate losartan due to dizziness. Will not rechallenge - Due for repeat echo    2. CAD s/p NSTEMI  - CABG x 5 in 3/22 as detailed above - No s/s ischemia - No ASA given Eliquis.  - Continue statin + beta blocker.  3. CKD 3b-IV - Baseline SCR 1.6-1.8 - Continue SGLT2i. - Previously declined referral to Nephrology.  - Labs today.   4. CHB - S/P CRT-P- 07/27/20  - Followed in [facility]. - Septal dyssynchrony noted on echo 3/23.  - QRS on ECG today 118 msec   5. PAF - In NSR today - CHA2DS2-VASc of 5. - Now with  age 43 and SCr > 1.5 drop dose to 2.5 bid  6. DM2 - PCP managing. - Continue SGLT2i.  7. Ataxia - prominent on exam concerning for cerebellar dysfunction - refer to Neurology for w/u - refer PT/OT   [name], MD  10:36 PM

## 2022-07-23 NOTE — Telephone Encounter (Signed)
Referral faxed to [facility] Physical therapy department.

## 2022-07-26 ENCOUNTER — Ambulatory Visit (INDEPENDENT_AMBULATORY_CARE_PROVIDER_SITE_OTHER): Payer: Medicare HMO

## 2022-07-26 DIAGNOSIS — I442 Atrioventricular block, complete: Secondary | ICD-10-CM | POA: Diagnosis not present

## 2022-07-26 LAB — CUP PACEART REMOTE DEVICE CHECK
Battery Remaining Longevity: 40 mo
Battery Remaining Percentage: 62 %
Battery Voltage: 2.96 V
Brady Statistic AP VP Percent: 9.7 %
Brady Statistic AP VS Percent: 1 %
Brady Statistic AS VP Percent: 90 %
Brady Statistic AS VS Percent: 1 %
Brady Statistic RA Percent Paced: 9.7 %
Date Time Interrogation Session: 20240328052159
Implantable Lead Connection Status: 753985
Implantable Lead Connection Status: 753985
Implantable Lead Connection Status: 753985
Implantable Lead Implant Date: 20220330
Implantable Lead Implant Date: 20220330
Implantable Lead Implant Date: 20220330
Implantable Lead Location: 753858
Implantable Lead Location: 753859
Implantable Lead Location: 753860
Implantable Pulse Generator Implant Date: 20220330
Lead Channel Impedance Value: 450 Ohm
Lead Channel Impedance Value: 530 Ohm
Lead Channel Impedance Value: 630 Ohm
Lead Channel Pacing Threshold Amplitude: 0.625 V
Lead Channel Pacing Threshold Amplitude: 1.25 V
Lead Channel Pacing Threshold Amplitude: 2.125 V
Lead Channel Pacing Threshold Pulse Width: 0.5 ms
Lead Channel Pacing Threshold Pulse Width: 0.5 ms
Lead Channel Pacing Threshold Pulse Width: 1 ms
Lead Channel Sensing Intrinsic Amplitude: 12 mV
Lead Channel Sensing Intrinsic Amplitude: 2.4 mV
Lead Channel Setting Pacing Amplitude: 2 V
Lead Channel Setting Pacing Amplitude: 2.5 V
Lead Channel Setting Pacing Amplitude: 2.625
Lead Channel Setting Pacing Pulse Width: 0.5 ms
Lead Channel Setting Pacing Pulse Width: 1 ms
Lead Channel Setting Sensing Sensitivity: 4 mV
Pulse Gen Model: 3562
Pulse Gen Serial Number: 3862449

## 2022-07-26 NOTE — Progress Notes (Signed)
I called patient and instructed her to change dose of Eliquis.

## 2022-07-26 NOTE — Addendum Note (Signed)
Encounter addended by: Asencion Noble, CMA on: 07/26/2022 9:55 AM  Actions taken: Clinical Note Signed

## 2022-08-29 NOTE — Progress Notes (Signed)
Remote pacemaker transmission.   

## 2022-09-27 ENCOUNTER — Ambulatory Visit: Payer: Medicare HMO | Admitting: Diagnostic Neuroimaging

## 2022-10-25 ENCOUNTER — Ambulatory Visit (INDEPENDENT_AMBULATORY_CARE_PROVIDER_SITE_OTHER): Payer: Medicare PPO

## 2022-10-25 DIAGNOSIS — I442 Atrioventricular block, complete: Secondary | ICD-10-CM | POA: Diagnosis not present

## 2022-10-25 LAB — CUP PACEART REMOTE DEVICE CHECK
Battery Remaining Longevity: 47 mo
Battery Remaining Percentage: 58 %
Battery Voltage: 2.96 V
Brady Statistic AP VP Percent: 6 %
Brady Statistic AP VS Percent: 1 %
Brady Statistic AS VP Percent: 94 %
Brady Statistic AS VS Percent: 1 %
Brady Statistic RA Percent Paced: 6 %
Date Time Interrogation Session: 20240627040007
Implantable Lead Connection Status: 753985
Implantable Lead Connection Status: 753985
Implantable Lead Connection Status: 753985
Implantable Lead Implant Date: 20220330
Implantable Lead Implant Date: 20220330
Implantable Lead Implant Date: 20220330
Implantable Lead Location: 753858
Implantable Lead Location: 753859
Implantable Lead Location: 753860
Implantable Pulse Generator Implant Date: 20220330
Lead Channel Impedance Value: 440 Ohm
Lead Channel Impedance Value: 510 Ohm
Lead Channel Impedance Value: 580 Ohm
Lead Channel Pacing Threshold Amplitude: 0.625 V
Lead Channel Pacing Threshold Amplitude: 1.25 V
Lead Channel Pacing Threshold Amplitude: 2 V
Lead Channel Pacing Threshold Pulse Width: 0.5 ms
Lead Channel Pacing Threshold Pulse Width: 0.5 ms
Lead Channel Pacing Threshold Pulse Width: 1 ms
Lead Channel Sensing Intrinsic Amplitude: 12 mV
Lead Channel Sensing Intrinsic Amplitude: 2.5 mV
Lead Channel Setting Pacing Amplitude: 2 V
Lead Channel Setting Pacing Amplitude: 2.5 V
Lead Channel Setting Pacing Amplitude: 2.5 V
Lead Channel Setting Pacing Pulse Width: 0.5 ms
Lead Channel Setting Pacing Pulse Width: 1 ms
Lead Channel Setting Sensing Sensitivity: 4 mV
Pulse Gen Model: 3562
Pulse Gen Serial Number: 3862449

## 2022-11-08 NOTE — Progress Notes (Signed)
Remote pacemaker transmission.   

## 2022-11-15 IMAGING — CT CT CHEST W/O CM
2 of 4 series · 15 of 36 positions shown, 18 images · non-contrast
Comparison: January 20, 2020.

CLINICAL DATA: Thoracic aortic disease.

EXAM:
CT CHEST WITHOUT CONTRAST
TECHNIQUE: Multidetector CT imaging of the chest was performed following the
standard protocol without IV contrast.

[Series 3: chest wo · axial · 0.65mm/px · z∈[-292,-32]mm · 12 of 154 slices shown, 15 images]
[im 12/154  mediastinal]
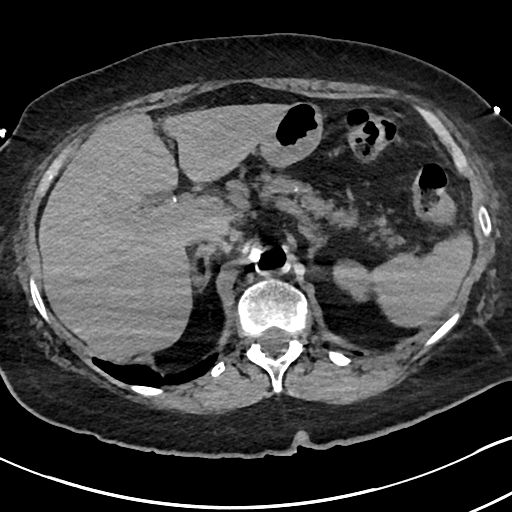
[im 12/154  lung]
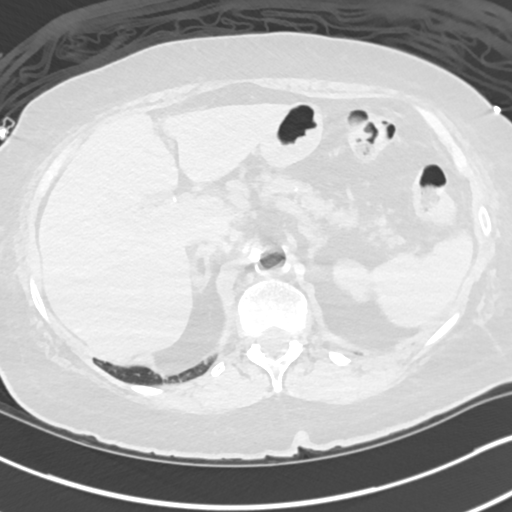
[im 24/154  lung]
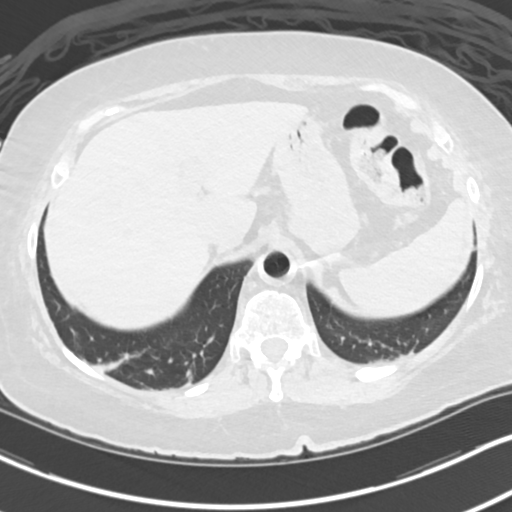
[im 36/154  lung]
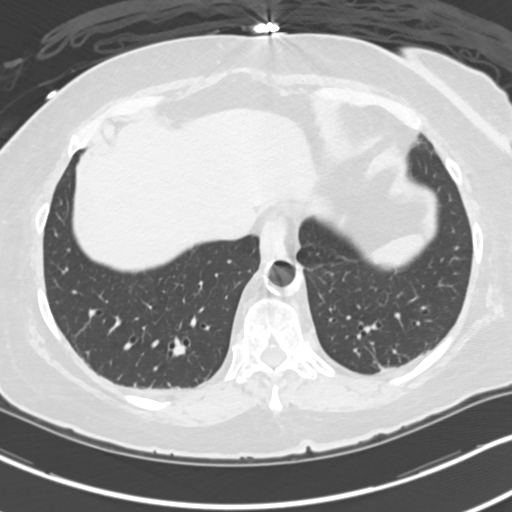
[im 48/154  lung]
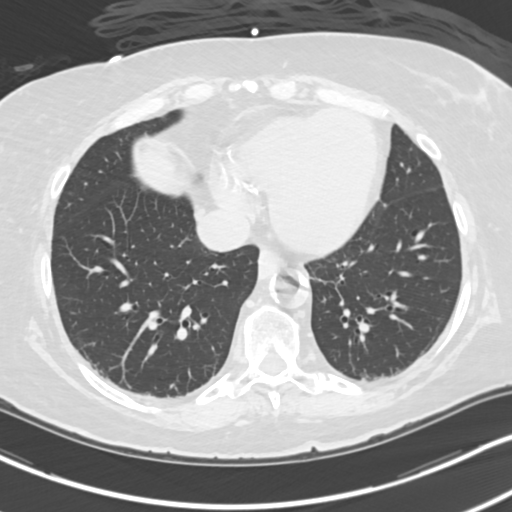
[im 59/154  mediastinal]
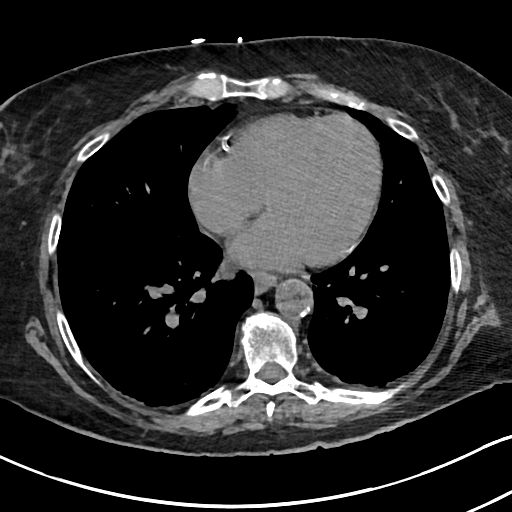
[im 59/154  lung]
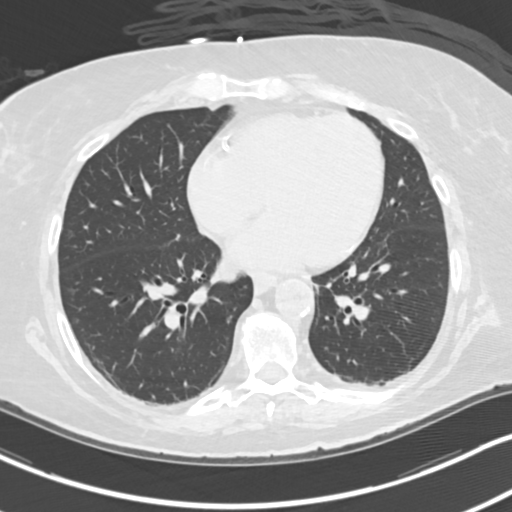
[im 71/154  lung]
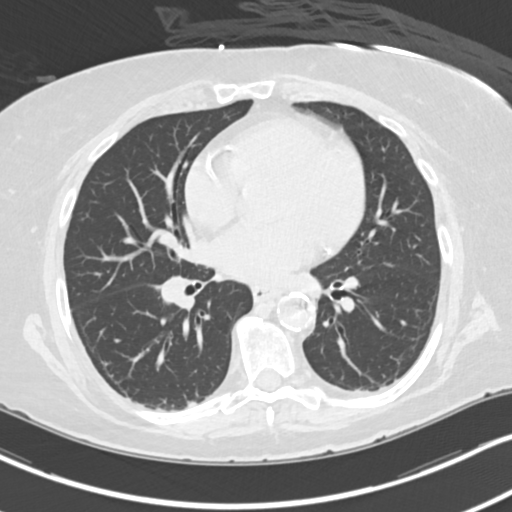
[im 83/154  lung]
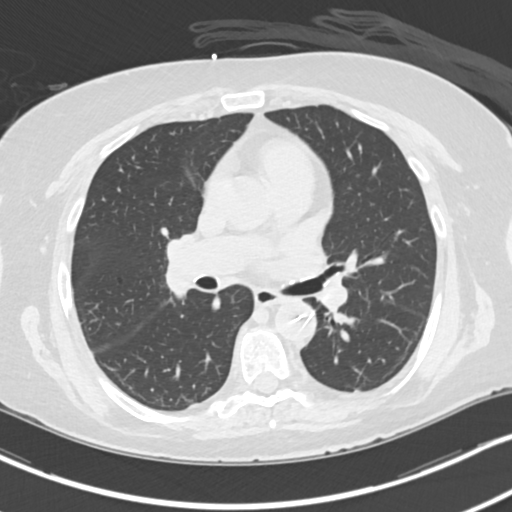
[im 95/154  lung]
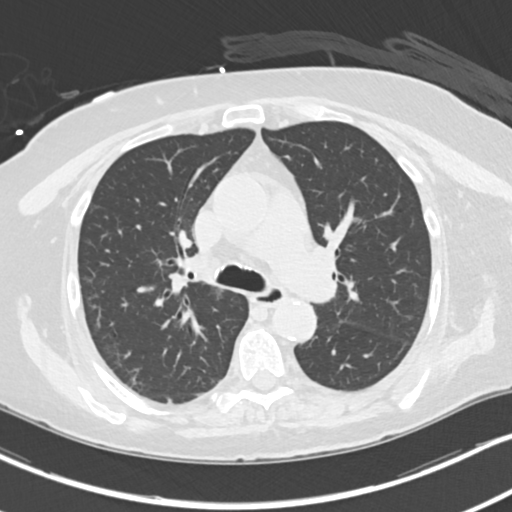
[im 106/154  mediastinal]
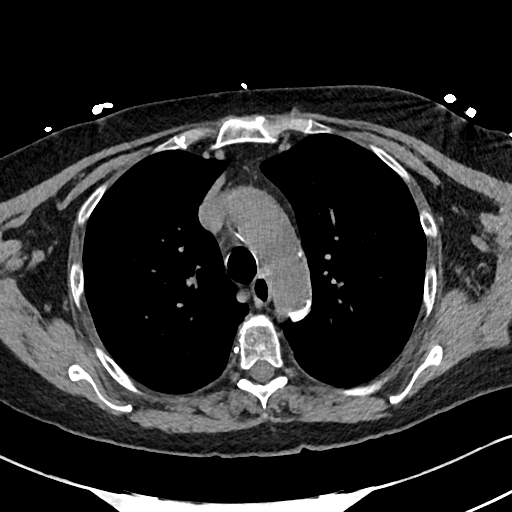
[im 106/154  lung]
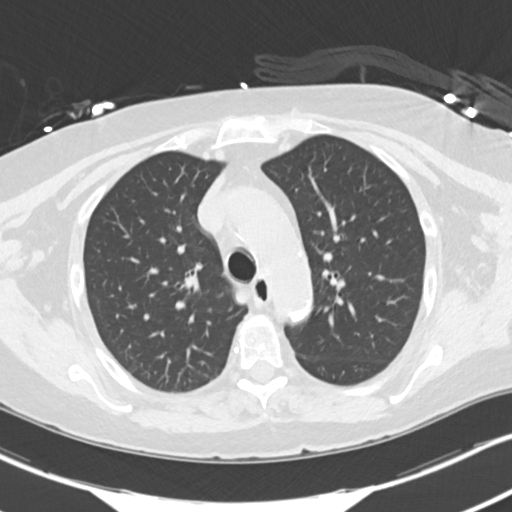
[im 118/154  lung]
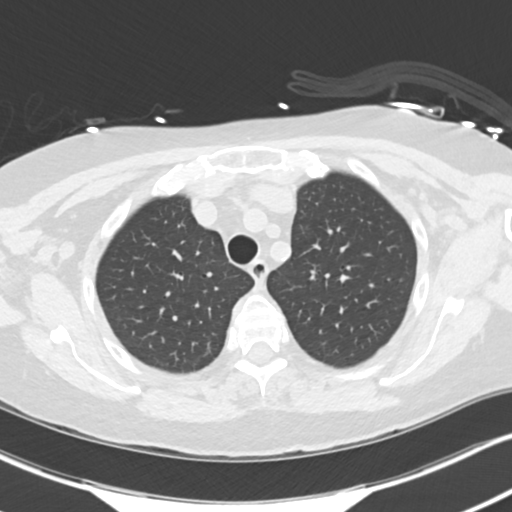
[im 130/154  lung]
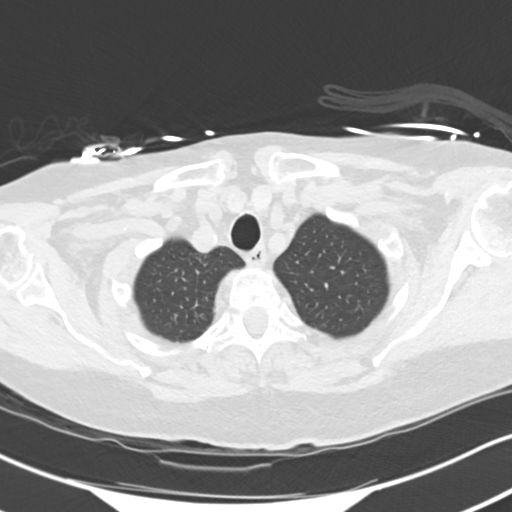
[im 142/154  lung]
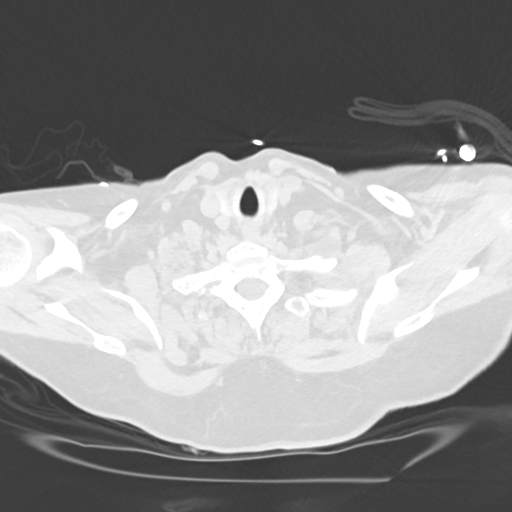

[Series 6: cor · coronal · 0.61mm/px · 3 of 141 slices shown]
[im 29/141  lung]
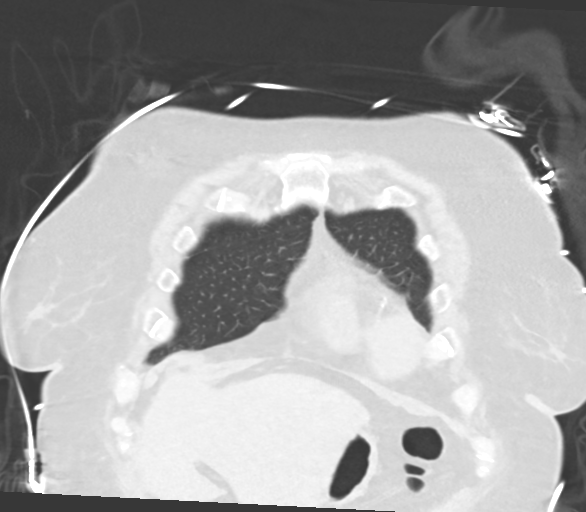
[im 57/141  lung]
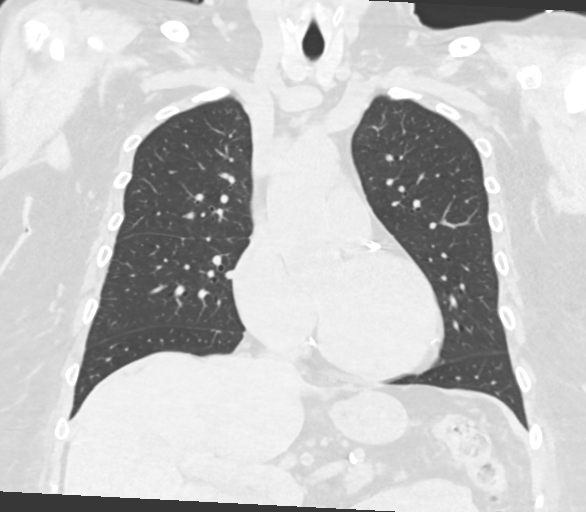
[im 85/141  lung]
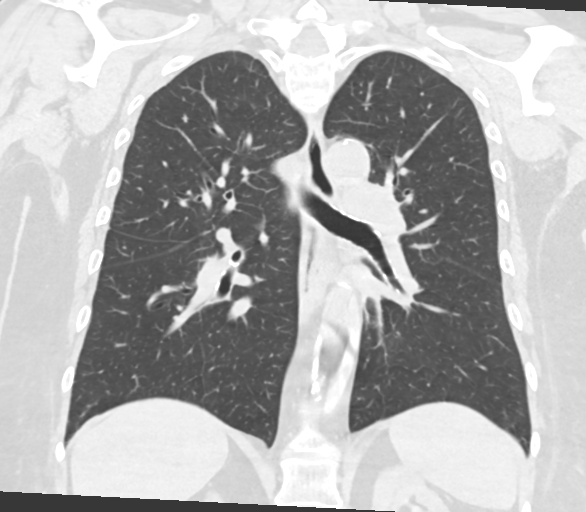

[15 of 36 positions shown; findings below may reference images not displayed]

FINDINGS: Cardiovascular: Atherosclerosis of thoracic aorta is noted without
aneurysm formation. Normal cardiac size. No pericardial effusion.
Extensive coronary artery calcifications are noted. Intra-aortic
balloon is noted in the distal portion of the descending thoracic
aorta.

Mediastinum/Nodes: No enlarged mediastinal or axillary lymph nodes.
Thyroid gland, trachea, and esophagus demonstrate no significant
findings.

Lungs/Pleura: No pneumothorax or pleural effusion is noted. Minimal
bilateral posterior basilar subsegmental atelectasis or scarring is
noted.

Upper Abdomen: No acute abnormality.

Musculoskeletal: No chest wall mass or suspicious bone lesions
identified.
IMPRESSION: 1. Intra-aortic balloon is noted in the distal portion of the
descending thoracic aorta.
2. Extensive coronary artery calcifications are noted suggesting
coronary artery disease.
3. Minimal bilateral posterior basilar subsegmental atelectasis or
scarring is noted.
4. Aortic atherosclerosis.

Aortic Atherosclerosis ([id number]).

## 2022-11-16 IMAGING — DX DG CHEST 1V PORT
1 series · 1 of 1 positions shown · non-contrast
Comparison: Radiograph 07/21/2020

CLINICAL DATA: Status post CABG X 5

EXAM:
PORTABLE CHEST 1 VIEW

[chest ap]
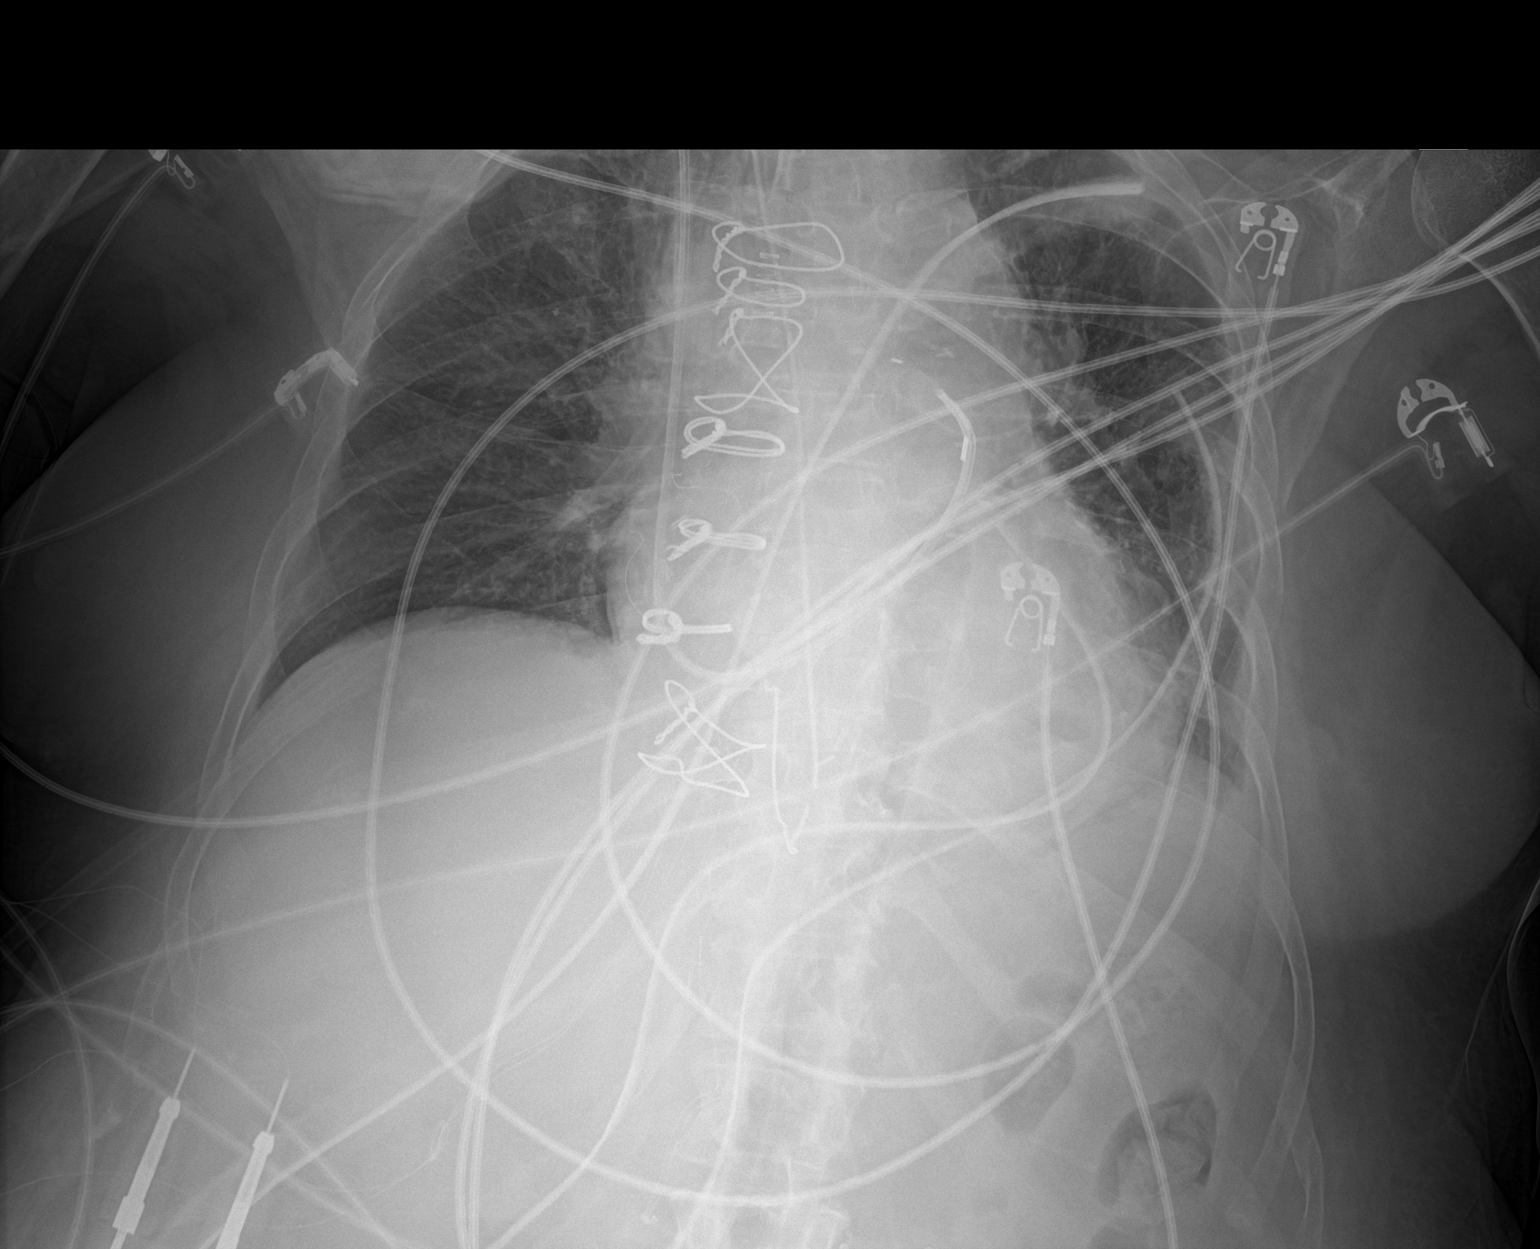

[1 of 1 positions shown; findings below may reference images not displayed]

FINDINGS: Endotracheal tube tip terminates in the mid trachea 5 cm from the
carina.

Right IJ catheter sheath remains in place with Swan-Ganz catheter
terminating near the pulmonary trunk.

A transesophageal tube appears curled within the distal thoracic
esophagus, redirected superiorly. Recommend repositioning.

Mediastinal and left pleural drains in place.

Epicardial pacer wires are seen.

Telemetry leads and additional external support devices overlie the
chest.

Stable postoperative cardiomediastinal contours. Some residual hazy
opacities in the lungs may reflect a combination of atelectasis and
edema with trace residual left effusion. No visible pneumothorax.
Linear lucency extending from the lower mediastinum across the upper
abdomen may be related to skin fold. No other acute osseous or soft
tissue abnormality. Degenerative changes are present in the imaged
spine and shoulders.
IMPRESSION: 1. Lines and tubes as described above.
2. A transesophageal tube appears curled within the distal thoracic
esophagus, redirected superiorly. Recommend repositioning.
3. Linear lucency extending from the lower mediastinum across the
upper abdomen may be related to skin fold, could consider
repositioning and reimaging.
4. Otherwise satisfactory postoperative appearance of the chest.

## 2022-11-18 IMAGING — DX DG CHEST 1V PORT
1 series · 1 of 1 positions shown · non-contrast
Comparison: 07/22/2020

CLINICAL DATA: Pleural effusion

EXAM:
PORTABLE CHEST 1 VIEW

[chest ap]
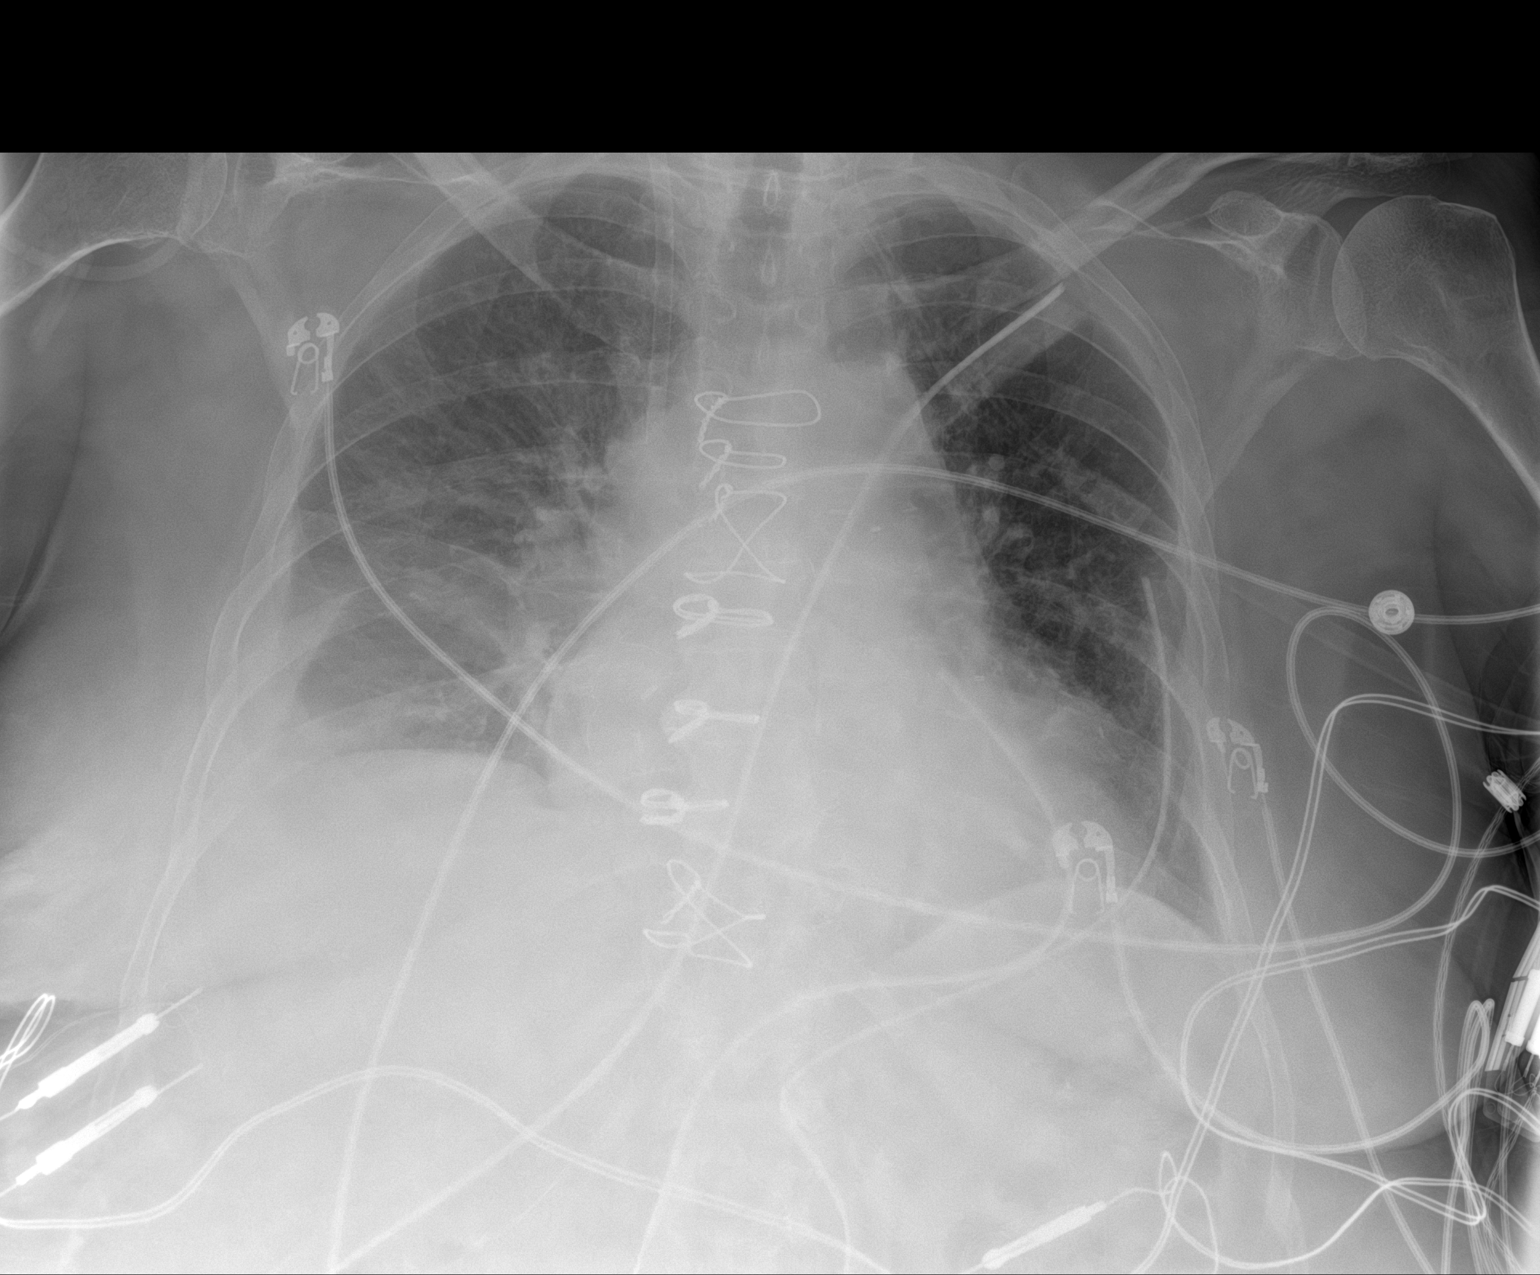

[1 of 1 positions shown; findings below may reference images not displayed]

FINDINGS: Interval extubation and removal of right IJ Swan-Ganz catheter.

Two left chest tubes and mediastinal drain. No pneumothorax is seen.

Small layering right pleural effusion. Associated mild right lower
lobe atelectasis.

Cardiomegaly.  Postsurgical changes related to prior CABG.

Median sternotomy.  Right IJ venous sheath.
IMPRESSION: Two left chest tubes and mediastinal drain. No pneumothorax is seen.

Small layering right pleural effusion. Associated mild right lower
lobe atelectasis.

Interval extubation. Additional support apparatus as above.

## 2022-11-20 IMAGING — DX DG CHEST 1V
1 series · 1 of 1 positions shown · non-contrast
Comparison: 07/24/2020.

CLINICAL DATA: Sore chest.  Status post open heart surgery.

EXAM:
CHEST  1 VIEW

[chest ap]
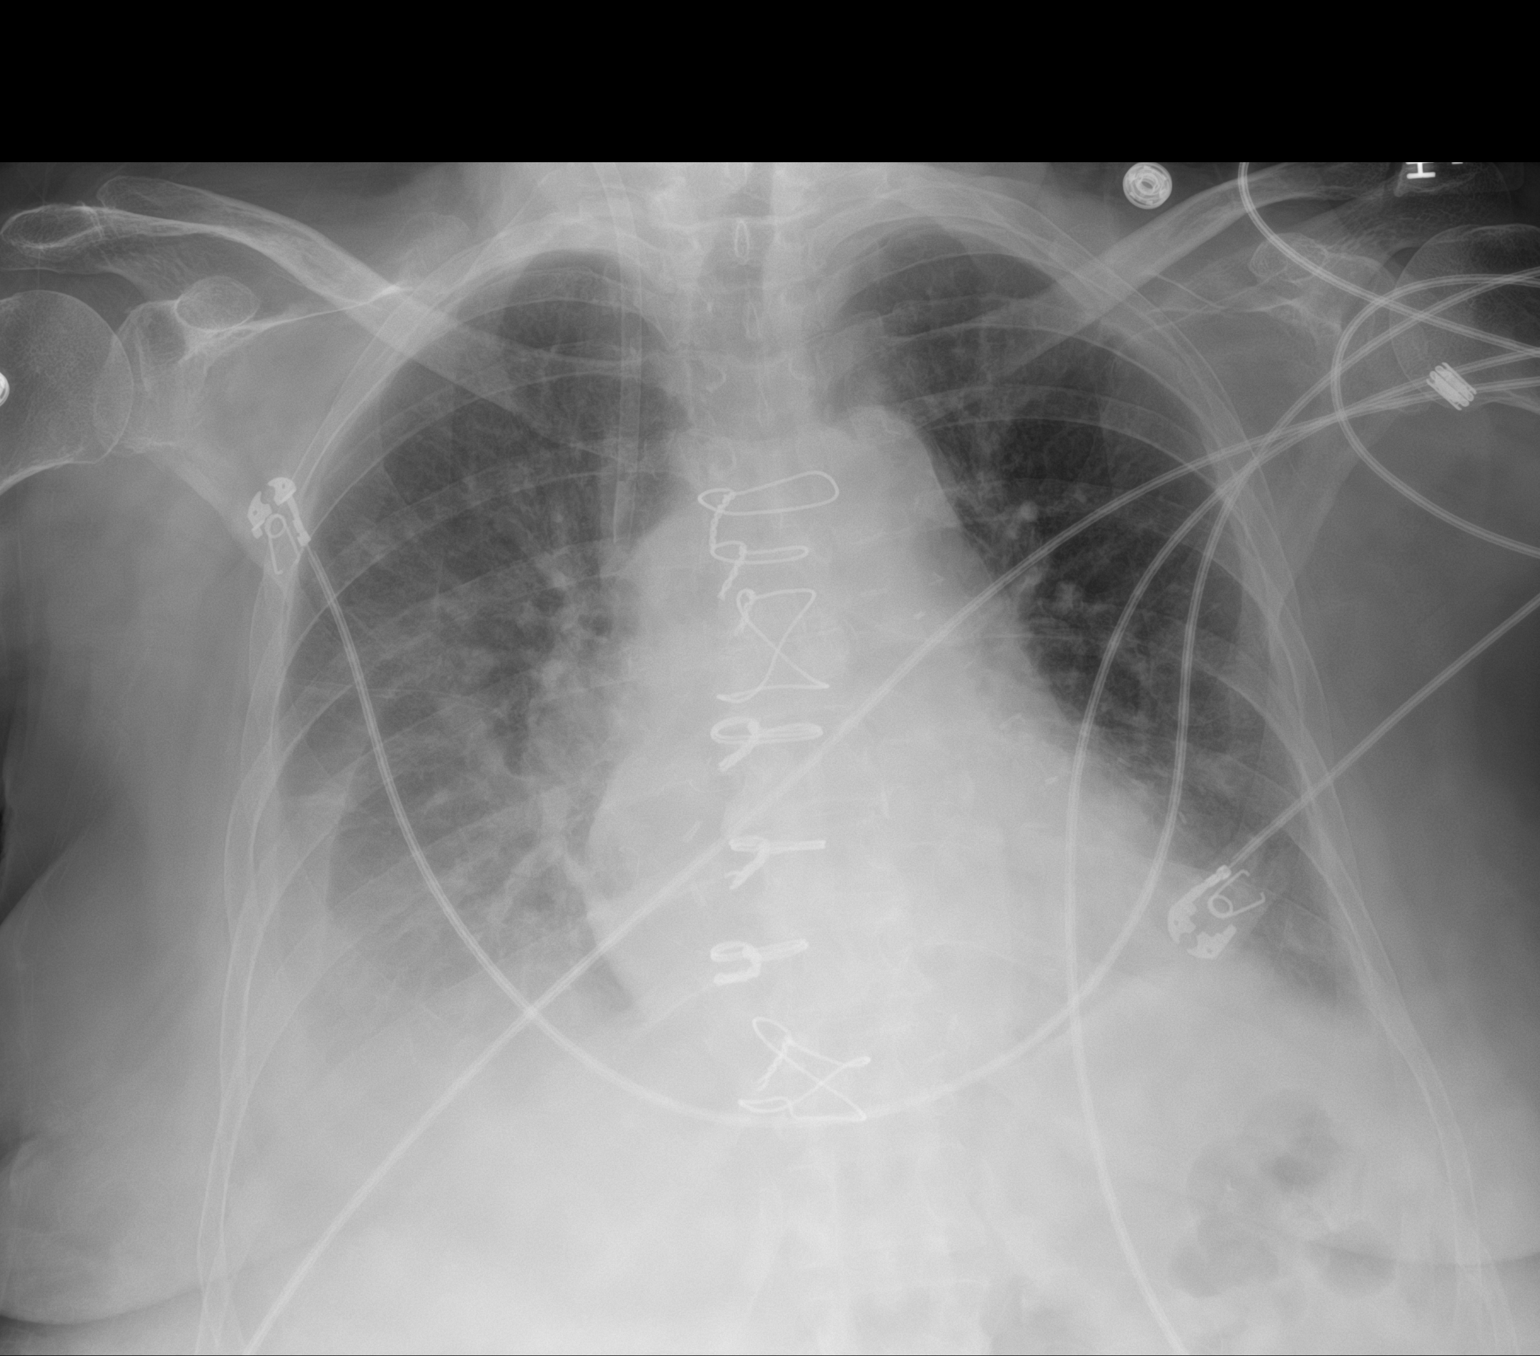

[1 of 1 positions shown; findings below may reference images not displayed]

FINDINGS: Interval removal of 2 left chest tubes. Right IJ sheath in stable
position. Prior median sternotomy. Stable cardiomegaly. Low lung
volumes with persistent bibasilar atelectasis. Bibasilar
infiltrates/edema cannot be excluded. Small right pleural effusion
again noted. Tiny left pleural effusion cannot be excluded. No
pneumothorax.
IMPRESSION: 1. Interval removal of 2 left chest tubes. No pneumothorax. Right IJ
sheath in stable position.
2. Prior median sternotomy. Stable cardiomegaly.
3. Low lung volumes with persistent bibasilar atelectasis. Bibasilar
infiltrates/edema cannot be excluded. Small right pleural effusion
again noted. Tiny left pleural effusion cannot be excluded.

## 2022-12-02 IMAGING — DX DG CHEST 1V PORT
1 series · 1 of 1 positions shown · non-contrast
Comparison: August 02, 2020

CLINICAL DATA: Pleural effusion

EXAM:
PORTABLE CHEST 1 VIEW

[chest ap]
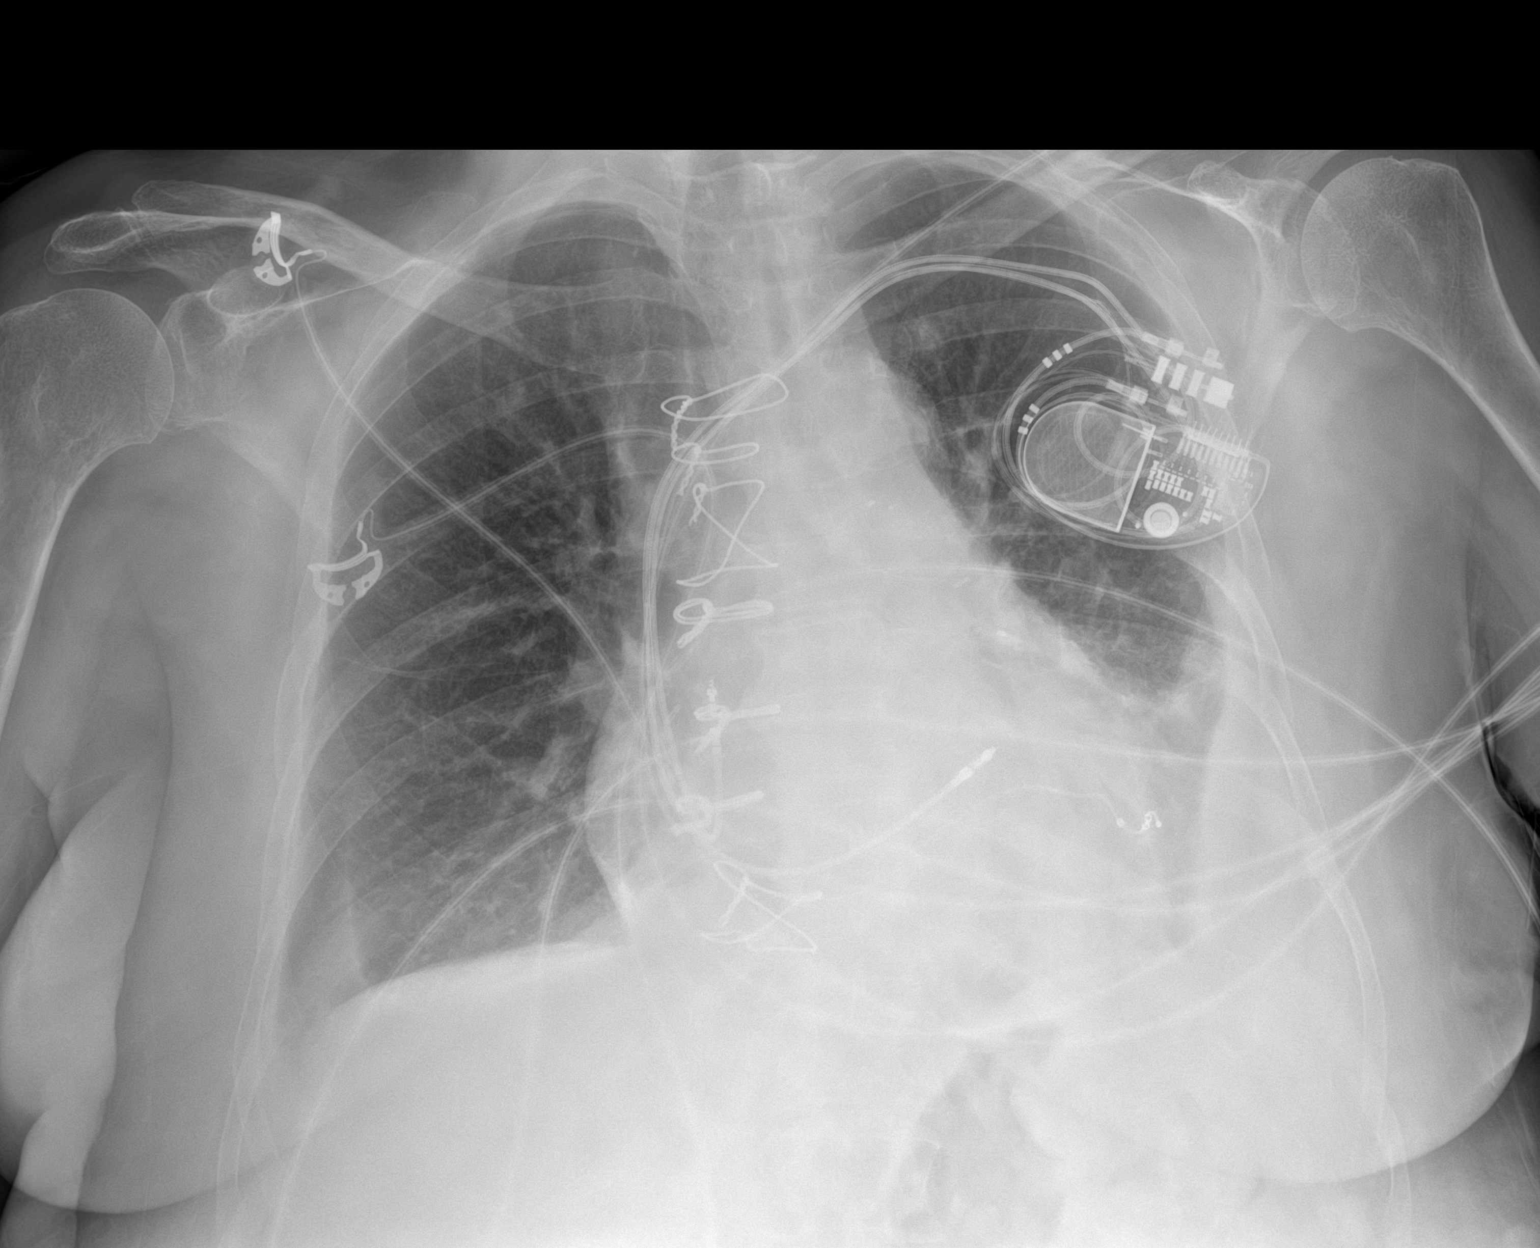

[1 of 1 positions shown; findings below may reference images not displayed]

FINDINGS: There is a partially loculated pleural effusion on the left which
appears slightly larger. There is a small right pleural effusion
which appears partially loculated, unchanged. There is atelectatic
change and questionable infiltrate left base.

Stable cardiac prominence with pulmonary vascularity normal.
Pacemaker leads attached to right atrium, right ventricle, and
coronary sinus. Patient is status post median sternotomy. There is
aortic atherosclerosis. No adenopathy. No bone lesions.
IMPRESSION: Partially loculated pleural effusion on the left appears slightly
larger than on most recent study. Small partially loculated pleural
effusion right base appear stable. Atelectatic change with
questionable superimposed pneumonia left base.

Stable cardiac prominence with pacemaker leads unchanged. Aortic
Atherosclerosis ([id number]).

## 2022-12-26 ENCOUNTER — Ambulatory Visit: Payer: Medicare HMO | Admitting: Diagnostic Neuroimaging

## 2023-01-24 ENCOUNTER — Ambulatory Visit (INDEPENDENT_AMBULATORY_CARE_PROVIDER_SITE_OTHER): Payer: Medicare PPO

## 2023-01-24 DIAGNOSIS — I442 Atrioventricular block, complete: Secondary | ICD-10-CM

## 2023-01-24 LAB — CUP PACEART REMOTE DEVICE CHECK
Battery Remaining Longevity: 44 mo
Battery Remaining Percentage: 54 %
Battery Voltage: 2.98 V
Brady Statistic AP VP Percent: 13 %
Brady Statistic AP VS Percent: 1 %
Brady Statistic AS VP Percent: 87 %
Brady Statistic AS VS Percent: 1 %
Brady Statistic RA Percent Paced: 13 %
Date Time Interrogation Session: 20240926044150
Implantable Lead Connection Status: 753985
Implantable Lead Connection Status: 753985
Implantable Lead Connection Status: 753985
Implantable Lead Implant Date: 20220330
Implantable Lead Implant Date: 20220330
Implantable Lead Implant Date: 20220330
Implantable Lead Location: 753858
Implantable Lead Location: 753859
Implantable Lead Location: 753860
Implantable Pulse Generator Implant Date: 20220330
Lead Channel Impedance Value: 410 Ohm
Lead Channel Impedance Value: 490 Ohm
Lead Channel Impedance Value: 580 Ohm
Lead Channel Pacing Threshold Amplitude: 0.75 V
Lead Channel Pacing Threshold Amplitude: 1.25 V
Lead Channel Pacing Threshold Amplitude: 2 V
Lead Channel Pacing Threshold Pulse Width: 0.5 ms
Lead Channel Pacing Threshold Pulse Width: 0.5 ms
Lead Channel Pacing Threshold Pulse Width: 1 ms
Lead Channel Sensing Intrinsic Amplitude: 12 mV
Lead Channel Sensing Intrinsic Amplitude: 2.8 mV
Lead Channel Setting Pacing Amplitude: 2 V
Lead Channel Setting Pacing Amplitude: 2.5 V
Lead Channel Setting Pacing Amplitude: 2.5 V
Lead Channel Setting Pacing Pulse Width: 0.5 ms
Lead Channel Setting Pacing Pulse Width: 1 ms
Lead Channel Setting Sensing Sensitivity: 4 mV
Pulse Gen Model: 3562
Pulse Gen Serial Number: 3862449

## 2023-02-05 NOTE — Progress Notes (Signed)
Remote pacemaker transmission.   

## 2023-03-19 ENCOUNTER — Encounter: Payer: Self-pay | Admitting: Neurology

## 2023-03-19 ENCOUNTER — Ambulatory Visit (INDEPENDENT_AMBULATORY_CARE_PROVIDER_SITE_OTHER): Payer: Medicare PPO | Admitting: Neurology

## 2023-03-19 VITALS — BP 122/72 | HR 101 | Ht 65.0 in | Wt 152.0 lb

## 2023-03-19 DIAGNOSIS — R269 Unspecified abnormalities of gait and mobility: Secondary | ICD-10-CM | POA: Insufficient documentation

## 2023-03-19 DIAGNOSIS — Z7984 Long term (current) use of oral hypoglycemic drugs: Secondary | ICD-10-CM

## 2023-03-19 DIAGNOSIS — E1142 Type 2 diabetes mellitus with diabetic polyneuropathy: Secondary | ICD-10-CM | POA: Insufficient documentation

## 2023-03-19 NOTE — Progress Notes (Signed)
Chief Complaint  Patient presents with   Gait Problem    Rm 14 alone Pt is well, reports she has had a problem with her balance and gait for about a year. She denies any falls. No other concerns.       ASSESSMENT AND PLAN  Kristin [name] is a 82 y.o. female   Long history of diabetes, under suboptimal control, A1c was 9.5 in September 2023, evidence of diabetic peripheral neuropathy,  Unsteady gait  She has significant orthostatic hypotension, that is most  related to her diabetic peripheral neuropathy, distal weakness, Multiple joints pain, could not rule out possibility of lumbar sacral radiculopathy, Discussed with patient, she does not want to have further evaluation such as MRI of lumbar, EMG nerve conduction study at this point, she is functioning okay, does not have frequent fall accident Advised her to increase water intake, tight stocking, abdominal band, orthostatic counter maneuver,  She will call clinic for worsening symptoms,  DIAGNOSTIC DATA (LABS, IMAGING, TESTING) - I reviewed patient records, labs, notes, testing and imaging myself where available.   MEDICAL HISTORY:  Kristin [name] is a 82 year old female, brought in by daughter, alone at today's clinical visit, seen in request by   his cardiologist Dr. Gala Romney, Reuel Boom for evaluation of unsteady gait, her primary care is at [facility], initial evaluation was on March 19, 2023  History is obtained from the patient and review of electronic medical records. I personally reviewed pertinent available imaging films in PACS.   PMHx of  CAD, CABG DM for more 40 years Depression Pacemaker A fib on Eliquis  She had long history of diabetes, developed diabetic peripheral neuropathy, complains of bilateral toes numbness tingling, slowly worsening over the years, but still stayed below ankle level  She had emergent CABG surgery in March 2022 following non-STMI with left bundle branch block,  systolic congestive heart failure due to ischemic cardiomyopathy, ejection fraction in April 22 was 40 to 45%, moderate tricuspid regurgitation inferior basal septal distal anterior wall hypokinesis,  During her visit with cardiology Dr. Milas Kocher in March 2024, she complained of unsteady gait,  She has this sense of unsteadiness since 2022 her cardiac issue, general deconditioning, daughter moved in with her, she is still independent in her daily activity, ambulate without a cane, denied low back pain, multiple hands joints pain, denies bowel and bladder incontinence no fall accident  Her worst dizziness unsteady sensation is in the morning after overnight sleep,  Today there is significant orthostatic blood pressure change, evidence of diabetic peripheral neuropathy  Sitting down: 141/61, HR 70,  Standing, 111/54, HR 71,  Standing x one minute: 116/55, HR 70 PHYSICAL EXAM:   Vitals:   03/19/23 1523  BP: 122/72  Pulse: (!) 101  Weight: 152 lb (68.9 kg)  Height: 5\' 5"  (1.651 m)   Sitting down: 141/61, HR 70,  Standing, 111/54, HR 71,  Standing x one minute: 116/55, HR 70   Body mass index is 25.29 kg/m.  PHYSICAL EXAMNIATION:  Gen: NAD, conversant, well nourised, well groomed                     Cardiovascular: Regular rate rhythm, no peripheral edema, warm, nontender. Eyes: Conjunctivae clear without exudates or hemorrhage Neck: Supple, no carotid bruits. Pulmonary: Clear to auscultation bilaterally   NEUROLOGICAL EXAM:  MENTAL STATUS: Speech/cognition: Awake, alert, oriented to history taking and casual conversation CRANIAL NERVES: CN II: Visual fields are full to confrontation. Pupils  are round equal and briskly reactive to light. CN III, IV, VI: extraocular movement are normal. No ptosis. CN V: Facial sensation is intact to light touch CN VII: Face is symmetric with normal eye closure  CN VIII: Hearing is normal to causal conversation. CN IX, X: Phonation is  normal. CN XI: Head turning and shoulder shrug are intact  MOTOR: Left hammertoes, mild bilateral ankle dorsiflexion plantarflexion weakness  REFLEXES: Reflexes are 1 and symmetric at the biceps, triceps, absent at knees, and ankles. Plantar responses are flexor.  SENSORY: Length-dependent decreased light touch, pinprick, vibratory sensation to knee level  COORDINATION: There is no trunk or limb dysmetria noted.  GAIT/STANCE: Push-up to get up from seated position, mild bilateral foot drop, unsteady, could not stand up on tiptoe heels,  REVIEW OF SYSTEMS:  Full 14 system review of systems performed and notable only for as above All other review of systems were negative.   ALLERGIES: Allergies  Allergen Reactions   Sulfa Antibiotics Hives    HOME MEDICATIONS: Current Outpatient Medications  Medication Sig Dispense Refill   apixaban (ELIQUIS) 5 MG TABS tablet Take 1 tablet (5 mg total) by mouth 2 (two) times daily. 60 tablet    atorvastatin (LIPITOR) 80 MG tablet Take 80 mg by mouth daily.     buPROPion (WELLBUTRIN XL) 150 MG 24 hr tablet 150 mg daily.     insulin detemir (LEVEMIR) 100 UNIT/ML injection Inject 0.24 mLs (24 Units total) into the skin daily. (Patient taking differently: Inject 40 Units into the skin daily. Per patient on 10/10/21) 10 mL 11   JARDIANCE 25 MG TABS tablet Take 25 mg by mouth daily.     metoprolol succinate (TOPROL-XL) 25 MG 24 hr tablet Take 1 tablet (25 mg total) by mouth daily. 60 tablet 1   Nutritional Supplements (ENSURE MAX PROTEIN PO) Take 1 Can by mouth every other day.     Semaglutide (RYBELSUS) 14 MG TABS Take 14 mg by mouth daily.     acetaminophen (TYLENOL) 325 MG tablet Take 2 tablets (650 mg total) by mouth every 6 (six) hours as needed for mild pain or fever. (Patient not taking: Reported on 03/19/2023)     nitroGLYCERIN (NITROSTAT) 0.4 MG SL tablet See admin instructions. (Patient not taking: Reported on 03/19/2023)     No current  facility-administered medications for this visit.    PAST MEDICAL HISTORY: Past Medical History:  Diagnosis Date   Depression    Diabetes mellitus without complication (HCC)    Hypertension    Night sweats     PAST SURGICAL HISTORY: Past Surgical History:  Procedure Laterality Date   BIV PACEMAKER INSERTION CRT-P N/A 07/27/2020   Procedure: BIV PACEMAKER INSERTION CRT-P;  Surgeon: Regan Lemming, MD;  Location: [facility];  Service: Cardiovascular;  Laterality: N/A;   CORONARY ARTERY BYPASS GRAFT N/A 07/21/2020   Procedure: CORONARY ARTERY BYPASS GRAFTING (CABG), TIMES FIVE, USING LEFT INTERNAL MAMMARY ARTERY AND ENDOSCOPICALLY HARVESTED BILATERAL GREATER SAPHENOUS VEINS;  Surgeon: Linden Dolin, MD;  Location: [facility] OR;  Service: Open Heart Surgery;  Laterality: N/A;   ENDOVEIN HARVEST OF GREATER SAPHENOUS VEIN N/A 07/21/2020   Procedure: ENDOVEIN HARVEST OF BILATERAL GREATER SAPHENOUS VEINS;  Surgeon: Linden Dolin, MD;  Location: [facility] OR;  Service: Open Heart Surgery;  Laterality: N/A;   EYE SURGERY     GALLBLADDER SURGERY     IABP INSERTION N/A 07/21/2020   Procedure: IABP Insertion;  Surgeon: Rinaldo Cloud, MD;  Location:  [facility];  Service: Cardiovascular;  Laterality: N/A;   IR THORACENTESIS ASP PLEURAL SPACE W/IMG GUIDE  08/02/2020   IR THORACENTESIS ASP PLEURAL SPACE W/IMG GUIDE  08/08/2020   LEFT HEART CATH AND CORONARY ANGIOGRAPHY N/A 07/21/2020   Procedure: LEFT HEART CATH AND CORONARY ANGIOGRAPHY;  Surgeon: Rinaldo Cloud, MD;  Location: [facility];  Service: Cardiovascular;  Laterality: N/A;   MEDIASTINAL EXPLORATION N/A 07/22/2020   Procedure: MEDIASTINAL EXPLORATION;  Surgeon: Linden Dolin, MD;  Location: [facility] OR;  Service: Thoracic;  Laterality: N/A;   TEE WITHOUT CARDIOVERSION N/A 07/21/2020   Procedure: TRANSESOPHAGEAL ECHOCARDIOGRAM (TEE);  Surgeon: Linden Dolin, MD;  Location: [facility] OR;  Service: Open Heart Surgery;  Laterality: N/A;    TEE WITHOUT CARDIOVERSION N/A 07/22/2020   Procedure: TRANSESOPHAGEAL ECHOCARDIOGRAM (TEE);  Surgeon: Linden Dolin, MD;  Location: [facility] OR;  Service: Thoracic;  Laterality: N/A;   toenail surgery     both big toenails    FAMILY HISTORY: Family History  Problem Relation Age of Onset   Diabetes Mother    Heart attack Mother    Heart attack Father     SOCIAL HISTORY: Social History   Socioeconomic History   Marital status: Widowed    Spouse name: Not on file   Number of children: Not on file   Years of education: Not on file   Highest education level: Not on file  Occupational History   Occupation: retired   Tobacco Use   Smoking status: Never   Smokeless tobacco: Never  Vaping Use   Vaping status: Never Used  Substance and Sexual Activity   Alcohol use: No   Drug use: No   Sexual activity: Not on file  Other Topics Concern   Not on file  Social History Narrative   Not on file   Social Determinants of Health   Financial Resource Strain: Low Risk  (08/10/2020)   Overall Financial Resource Strain (CARDIA)    Difficulty of Paying Living Expenses: Not very hard  Food Insecurity: Not on file  Transportation Needs: No Transportation Needs (08/10/2020)   PRAPARE - Administrator, Civil Service (Medical): No    Lack of Transportation (Non-Medical): No  Physical Activity: Not on file  Stress: Not on file  Social Connections: Not on file  Intimate Partner Violence: Not on file      Levert Feinstein, M.D. Ph.D.  [facility] [address] [place], [place] [address] Ph: [phone] Fax: [phone]  CC:  Bensimhon, Bevelyn Buckles, MD [address] [place],  [place] [address]  Alveria Apley, NP (Inactive)

## 2023-04-25 ENCOUNTER — Ambulatory Visit (INDEPENDENT_AMBULATORY_CARE_PROVIDER_SITE_OTHER): Payer: Medicare PPO

## 2023-04-25 DIAGNOSIS — I442 Atrioventricular block, complete: Secondary | ICD-10-CM | POA: Diagnosis not present

## 2023-04-27 LAB — CUP PACEART REMOTE DEVICE CHECK
Battery Remaining Longevity: 43 mo
Battery Remaining Percentage: 51 %
Battery Voltage: 2.96 V
Brady Statistic AP VP Percent: 12 %
Brady Statistic AP VS Percent: 1 %
Brady Statistic AS VP Percent: 88 %
Brady Statistic AS VS Percent: 1 %
Brady Statistic RA Percent Paced: 12 %
Date Time Interrogation Session: 20241226040010
Implantable Lead Connection Status: 753985
Implantable Lead Connection Status: 753985
Implantable Lead Connection Status: 753985
Implantable Lead Implant Date: 20220330
Implantable Lead Implant Date: 20220330
Implantable Lead Implant Date: 20220330
Implantable Lead Location: 753858
Implantable Lead Location: 753859
Implantable Lead Location: 753860
Implantable Pulse Generator Implant Date: 20220330
Lead Channel Impedance Value: 360 Ohm
Lead Channel Impedance Value: 510 Ohm
Lead Channel Impedance Value: 550 Ohm
Lead Channel Pacing Threshold Amplitude: 0.625 V
Lead Channel Pacing Threshold Amplitude: 1.25 V
Lead Channel Pacing Threshold Amplitude: 1.5 V
Lead Channel Pacing Threshold Pulse Width: 0.5 ms
Lead Channel Pacing Threshold Pulse Width: 0.5 ms
Lead Channel Pacing Threshold Pulse Width: 1 ms
Lead Channel Sensing Intrinsic Amplitude: 12 mV
Lead Channel Sensing Intrinsic Amplitude: 2.3 mV
Lead Channel Setting Pacing Amplitude: 2 V
Lead Channel Setting Pacing Amplitude: 2 V
Lead Channel Setting Pacing Amplitude: 2.5 V
Lead Channel Setting Pacing Pulse Width: 0.5 ms
Lead Channel Setting Pacing Pulse Width: 1 ms
Lead Channel Setting Sensing Sensitivity: 4 mV
Pulse Gen Model: 3562
Pulse Gen Serial Number: 3862449

## 2023-05-02 NOTE — Progress Notes (Signed)
 This encounter was created in error - please disregard.

## 2023-05-03 ENCOUNTER — Ambulatory Visit: Payer: Medicare PPO | Admitting: Pulmonary Disease

## 2023-05-03 DIAGNOSIS — D6869 Other thrombophilia: Secondary | ICD-10-CM

## 2023-05-03 DIAGNOSIS — I442 Atrioventricular block, complete: Secondary | ICD-10-CM

## 2023-05-03 DIAGNOSIS — I5022 Chronic systolic (congestive) heart failure: Secondary | ICD-10-CM

## 2023-05-03 DIAGNOSIS — I480 Paroxysmal atrial fibrillation: Secondary | ICD-10-CM

## 2023-05-16 NOTE — Progress Notes (Signed)
Electrophysiology Office Note:   Date:  05/17/2023  ID:  Kristin, [name] [birth year], MRN [id number]  Primary Cardiologist: None Primary Heart Failure: None Electrophysiologist: Will Jorja Loa, MD       History of Present Illness:   Kristin [name] is a 83 y.o. female with h/o sCHF / NICM, LBBB, CHB s/p CRT-P, HTN, MI, CAD s/p CABGx5, DM II, anemia due to CKD 4 seen today for routine electrophysiology followup.   Since last being seen in our clinic the patient reports she has no device related concerns. She has arthritis in her hands that is somewhat limiting to her daily life.  She denies chest pain, palpitations, dyspnea, PND, orthopnea, nausea, vomiting, dizziness, syncope, edema, weight gain, or early satiety.   Review of systems complete and found to be negative unless listed in HPI.   EP Information / Studies Reviewed:    EKG is ordered today. Personal review as below.  EKG Interpretation Date/Time:  Friday May 17 2023 15:15:22 EST Ventricular Rate:  61 PR Interval:  192 QRS Duration:  120 QT Interval:  466 QTC Calculation: 469 R Axis:   -38  Text Interpretation: AV dual-paced rhythm Confirmed by Canary Brim ([id number]) on 05/17/2023 3:34:23 PM   PPM Interrogation-  reviewed in detail today,  See PACEART report.  Device History: Abbott BiV PPM implanted 07/27/2020 for CHB, sCHF / ICM  Studies:  ECHO 06/2021 > LVEF 45-50%    Arrhythmia / AAD CHB    Risk Assessment/Calculations:    CHA2DS2-VASc Score = 7   This indicates a 11.2% annual risk of stroke. The patient's score is based upon: CHF History: 1 HTN History: 1 Diabetes History: 1 Stroke History: 0 Vascular Disease History: 1 Age Score: 2 Gender Score: 1             Physical Exam:   VS:  BP (!) 116/48   Pulse 60   Ht 5\' 5"  (1.651 m)   Wt 138 lb (62.6 kg)   SpO2 98%   BMI 22.96 kg/m    Wt Readings from Last 3 Encounters:  05/17/23 138 lb (62.6 kg)  03/19/23 152 lb (68.9 kg)   07/23/22 139 lb 3.2 oz (63.1 kg)     GEN: Well nourished, well developed in no acute distress NECK: No JVD; No carotid bruits CARDIAC: Regular rate and rhythm, no murmurs, rubs, gallops RESPIRATORY:  Clear to auscultation without rales, wheezing or rhonchi  ABDOMEN: Soft, non-tender, non-distended EXTREMITIES:  No edema; No deformity   ASSESSMENT AND PLAN:    CHB s/p Abbott CRT-P   Chronic Systolic CHFdut to ICM  -Normal PPM function -See Pace Art report -No changes today -euvolemic on exam  -99% BiV Pacing / QRS on EKG -GDMT per Dr. Gala Romney   Hx PAF  CHA2DS2-VASc 7 -continue anticoagulation for stroke prophylaxis   -<1 % burden on device, no episodes    Secondary Hypercoagulable State  -continue eliquis > planning on PCP annual visit in February, review PCP labs at that time -based on age / cr, pt's dose should be 2.5 mg BID & she confirms she is taking 2.5mg  > she breaks her 5mg  tablets in half.  She will update the Rx with her PCP when she has it filled again.   CAD s/p CABG x5  -no anginal symptoms   Disposition:   Follow up with Dr. Elberta Fortis in 12 months  Signed, Canary Brim, NP-C, AGACNP-BC [facility] - Electrophysiology  05/17/2023, 4:04 PM

## 2023-05-17 ENCOUNTER — Ambulatory Visit: Payer: Medicare PPO | Attending: Pulmonary Disease | Admitting: Pulmonary Disease

## 2023-05-17 ENCOUNTER — Encounter: Payer: Self-pay | Admitting: Pulmonary Disease

## 2023-05-17 VITALS — BP 116/48 | HR 60 | Ht 65.0 in | Wt 138.0 lb

## 2023-05-17 DIAGNOSIS — I442 Atrioventricular block, complete: Secondary | ICD-10-CM | POA: Diagnosis not present

## 2023-05-17 DIAGNOSIS — I480 Paroxysmal atrial fibrillation: Secondary | ICD-10-CM

## 2023-05-17 DIAGNOSIS — I5022 Chronic systolic (congestive) heart failure: Secondary | ICD-10-CM | POA: Diagnosis not present

## 2023-05-17 DIAGNOSIS — D6869 Other thrombophilia: Secondary | ICD-10-CM | POA: Diagnosis not present

## 2023-05-17 LAB — CUP PACEART INCLINIC DEVICE CHECK
Battery Remaining Longevity: 43 mo
Battery Voltage: 2.96 V
Brady Statistic RA Percent Paced: 14 %
Brady Statistic RV Percent Paced: 99.97 %
Date Time Interrogation Session: 20250117160038
Implantable Lead Connection Status: 753985
Implantable Lead Connection Status: 753985
Implantable Lead Connection Status: 753985
Implantable Lead Implant Date: 20220330
Implantable Lead Implant Date: 20220330
Implantable Lead Implant Date: 20220330
Implantable Lead Location: 753858
Implantable Lead Location: 753859
Implantable Lead Location: 753860
Implantable Pulse Generator Implant Date: 20220330
Lead Channel Impedance Value: 412.5 Ohm
Lead Channel Impedance Value: 562.5 Ohm
Lead Channel Impedance Value: 587.5 Ohm
Lead Channel Pacing Threshold Amplitude: 0.625 V
Lead Channel Pacing Threshold Amplitude: 1 V
Lead Channel Pacing Threshold Amplitude: 1 V
Lead Channel Pacing Threshold Amplitude: 1.75 V
Lead Channel Pacing Threshold Pulse Width: 0.5 ms
Lead Channel Pacing Threshold Pulse Width: 0.5 ms
Lead Channel Pacing Threshold Pulse Width: 0.5 ms
Lead Channel Pacing Threshold Pulse Width: 1 ms
Lead Channel Sensing Intrinsic Amplitude: 12 mV
Lead Channel Sensing Intrinsic Amplitude: 3.2 mV
Lead Channel Setting Pacing Amplitude: 2 V
Lead Channel Setting Pacing Amplitude: 2.5 V
Lead Channel Setting Pacing Amplitude: 2.75 V
Lead Channel Setting Pacing Pulse Width: 0.5 ms
Lead Channel Setting Pacing Pulse Width: 1 ms
Lead Channel Setting Sensing Sensitivity: 4 mV
Pulse Gen Model: 3562
Pulse Gen Serial Number: 3862449

## 2023-05-17 NOTE — Patient Instructions (Signed)
Medication Instructions:  Your physician recommends that you continue on your current medications as directed. Please refer to the Current Medication list given to you today.  *If you need a refill on your cardiac medications before your next appointment, please call your pharmacy*  Lab Work: None ordered If you have labs (blood work) drawn today and your tests are completely normal, you will receive your results only by: MyChart Message (if you have MyChart) OR A paper copy in the mail If you have any lab test that is abnormal or we need to change your treatment, we will call you to review the results.  Follow-Up: At [facility], you and your health needs are our priority.  As part of our continuing mission to provide you with exceptional heart care, we have created designated Provider Care Teams.  These Care Teams include your primary Cardiologist (physician) and Advanced Practice Providers (APPs -  Physician Assistants and Nurse Practitioners) who all work together to provide you with the care you need, when you need it.  We recommend signing up for the patient portal called "MyChart".  Sign up information is provided on this After Visit Summary.  MyChart is used to connect with patients for Virtual Visits (Telemedicine).  Patients are able to view lab/test results, encounter notes, upcoming appointments, etc.  Non-urgent messages can be sent to your provider as well.   To learn more about what you can do with MyChart, go to [email or web address].    Your next appointment:   1 year(s)  Provider:   Loman Brooklyn, MD or Canary Brim, NP

## 2023-07-29 ENCOUNTER — Ambulatory Visit (INDEPENDENT_AMBULATORY_CARE_PROVIDER_SITE_OTHER): Payer: Self-pay

## 2023-07-29 DIAGNOSIS — I442 Atrioventricular block, complete: Secondary | ICD-10-CM

## 2023-07-29 LAB — CUP PACEART REMOTE DEVICE CHECK
Battery Remaining Longevity: 28 mo
Battery Remaining Percentage: 46 %
Battery Voltage: 2.95 V
Brady Statistic AP VP Percent: 16 %
Brady Statistic AP VS Percent: 1 %
Brady Statistic AS VP Percent: 84 %
Brady Statistic AS VS Percent: 1 %
Brady Statistic RA Percent Paced: 16 %
Date Time Interrogation Session: 20250331020009
Implantable Lead Connection Status: 753985
Implantable Lead Connection Status: 753985
Implantable Lead Connection Status: 753985
Implantable Lead Implant Date: 20220330
Implantable Lead Implant Date: 20220330
Implantable Lead Implant Date: 20220330
Implantable Lead Location: 753858
Implantable Lead Location: 753859
Implantable Lead Location: 753860
Implantable Pulse Generator Implant Date: 20220330
Lead Channel Impedance Value: 360 Ohm
Lead Channel Impedance Value: 510 Ohm
Lead Channel Impedance Value: 540 Ohm
Lead Channel Pacing Threshold Amplitude: 0.625 V
Lead Channel Pacing Threshold Amplitude: 1 V
Lead Channel Pacing Threshold Amplitude: 1.75 V
Lead Channel Pacing Threshold Pulse Width: 0.5 ms
Lead Channel Pacing Threshold Pulse Width: 0.5 ms
Lead Channel Pacing Threshold Pulse Width: 1 ms
Lead Channel Sensing Intrinsic Amplitude: 12 mV
Lead Channel Sensing Intrinsic Amplitude: 2.4 mV
Lead Channel Setting Pacing Amplitude: 2 V
Lead Channel Setting Pacing Amplitude: 2.5 V
Lead Channel Setting Pacing Amplitude: 2.75 V
Lead Channel Setting Pacing Pulse Width: 0.5 ms
Lead Channel Setting Pacing Pulse Width: 1 ms
Lead Channel Setting Sensing Sensitivity: 4 mV
Pulse Gen Model: 3562
Pulse Gen Serial Number: 3862449

## 2023-07-30 LAB — LAB REPORT - SCANNED
A1c: 8
Albumin, Urine POC: 1.1
Creatinine, POC: 92.61 mg/dL
EGFR (Non-African Amer.): 35.5
Microalb Creat Ratio: 11.9
TSH: 1.04

## 2023-09-03 ENCOUNTER — Encounter (HOSPITAL_COMMUNITY): Payer: Self-pay | Admitting: Internal Medicine

## 2023-09-03 ENCOUNTER — Ambulatory Visit (HOSPITAL_COMMUNITY)
Admission: RE | Admit: 2023-09-03 | Discharge: 2023-09-03 | Disposition: A | Discharge status: Home / Self Care | Source: Ambulatory Visit | Attending: Internal Medicine | Admitting: Internal Medicine

## 2023-09-03 VITALS — BP 118/60 | HR 60 | Wt 163.0 lb

## 2023-09-03 DIAGNOSIS — I480 Paroxysmal atrial fibrillation: Secondary | ICD-10-CM | POA: Insufficient documentation

## 2023-09-03 DIAGNOSIS — I2510 Atherosclerotic heart disease of native coronary artery without angina pectoris: Secondary | ICD-10-CM | POA: Insufficient documentation

## 2023-09-03 DIAGNOSIS — I130 Hypertensive heart and chronic kidney disease with heart failure and stage 1 through stage 4 chronic kidney disease, or unspecified chronic kidney disease: Secondary | ICD-10-CM | POA: Diagnosis not present

## 2023-09-03 DIAGNOSIS — Z7984 Long term (current) use of oral hypoglycemic drugs: Secondary | ICD-10-CM | POA: Diagnosis not present

## 2023-09-03 DIAGNOSIS — Z951 Presence of aortocoronary bypass graft: Secondary | ICD-10-CM | POA: Diagnosis not present

## 2023-09-03 DIAGNOSIS — I252 Old myocardial infarction: Secondary | ICD-10-CM | POA: Insufficient documentation

## 2023-09-03 DIAGNOSIS — E1122 Type 2 diabetes mellitus with diabetic chronic kidney disease: Secondary | ICD-10-CM | POA: Diagnosis not present

## 2023-09-03 DIAGNOSIS — Z79899 Other long term (current) drug therapy: Secondary | ICD-10-CM | POA: Diagnosis not present

## 2023-09-03 DIAGNOSIS — N1832 Chronic kidney disease, stage 3b: Secondary | ICD-10-CM | POA: Insufficient documentation

## 2023-09-03 DIAGNOSIS — R270 Ataxia, unspecified: Secondary | ICD-10-CM | POA: Diagnosis not present

## 2023-09-03 DIAGNOSIS — I255 Ischemic cardiomyopathy: Secondary | ICD-10-CM | POA: Diagnosis not present

## 2023-09-03 DIAGNOSIS — Z7901 Long term (current) use of anticoagulants: Secondary | ICD-10-CM | POA: Insufficient documentation

## 2023-09-03 DIAGNOSIS — Z45018 Encounter for adjustment and management of other part of cardiac pacemaker: Secondary | ICD-10-CM | POA: Insufficient documentation

## 2023-09-03 DIAGNOSIS — Z794 Long term (current) use of insulin: Secondary | ICD-10-CM | POA: Insufficient documentation

## 2023-09-03 DIAGNOSIS — N184 Chronic kidney disease, stage 4 (severe): Secondary | ICD-10-CM | POA: Diagnosis not present

## 2023-09-03 DIAGNOSIS — I5022 Chronic systolic (congestive) heart failure: Secondary | ICD-10-CM | POA: Insufficient documentation

## 2023-09-03 LAB — BASIC METABOLIC PANEL WITH GFR
Anion gap: 8 (ref 5–15)
BUN: 28 mg/dL — ABNORMAL HIGH (ref 8–23)
CO2: 27 mmol/L (ref 22–32)
Calcium: 9.3 mg/dL (ref 8.9–10.3)
Chloride: 108 mmol/L (ref 98–111)
Creatinine, Ser: 1.57 mg/dL — ABNORMAL HIGH (ref 0.44–1.00)
GFR, Estimated: 33 mL/min — ABNORMAL LOW (ref >60)
Glucose, Bld: 149 mg/dL — ABNORMAL HIGH (ref 70–99)
Potassium: 4.8 mmol/L (ref 3.5–5.1)
Sodium: 143 mmol/L (ref 135–145)

## 2023-09-03 LAB — BRAIN NATRIURETIC PEPTIDE: B Natriuretic Peptide: 98.2 pg/mL (ref 0.0–100.0)

## 2023-09-03 NOTE — Addendum Note (Signed)
 Encounter addended by: Glorietta Lark, RN on: 09/03/2023 4:09 PM  Actions taken: Order list changed, Diagnosis association updated, Clinical Note Signed, Charge Capture section accepted

## 2023-09-03 NOTE — Progress Notes (Signed)
 PCP: Burney Carter, [facility]  Primary Cardiologist: Chapman Commodore AHF: Dr. Marissia Blackham.  HPI: Kristin [name] is a 83 y.o.female with DM2, HTN, depression, PAF, CAD s/p emergent CABG 3/22, systolic HF due to iCM ischemic cardiomyopathy with CHB s/p CRT-P.   No prior cardiac history until 3/22  admitted with NSTEMI & LBBB.  Echo EF 40-45%. Cath mvCAD with LM  > IABP.  TCTS took for emergent CABG x 5 (LIMA to LAD, Seq VG to OM and Diagonal, SEQ to PL and PDA). Post-op tamponade. Taken back to OR for repair of RCA and IMA grafts.  BP remained low. Developed post op CHB -> STJ CRT-P placed.  Echo 08/02/20 EF 40-45%, mod TR, G1DD, Inferior basal septal distal anterior wall hypokinesis. Echo 3/23 showed EF 45-50%, RV normal, abnormal (paradoxical) septal motion, consistent with left bundle branch block.   At last visit referred back to EP [facility]) to see if CRT could be optimized. Device reprogrammed 6/23.   Here for f/u with her daughter. Lost 50-60 pounds with Rebelysus lost down to 128 pounds Weight back up to 162. Very active letting her dog out. Denies CP or SOB. No edema, orthopnea or PND. Remains on Eliquis  2.5 bid. No bleeding    Cardiac Studies: - Echo (3/23): EF 45-50%, RV normal, abnormal (paradoxical) septal motion, consistent with left bundle branch block - Echo (4/22): EF 40-45%, Mod TR, G1DD, Inferior basal septal distal anterior wall hypokinesis.   - Echo (3/22): EF 40-45%  ROS: All systems reviewed and negative except as per HPI.   Past Medical History:  Diagnosis Date   Depression    Diabetes mellitus without complication (HCC)    Hypertension    Night sweats     Current Outpatient Medications  Medication Sig Dispense Refill   acetaminophen  (TYLENOL ) 325 MG tablet Take 2 tablets (650 mg total) by mouth every 6 (six) hours as needed for mild pain or fever.     apixaban  (ELIQUIS ) 2.5 MG TABS tablet Take 2.5 mg by mouth 2 (two) times daily.     atorvastatin   (LIPITOR ) 80 MG tablet Take 80 mg by mouth daily.     buPROPion  (WELLBUTRIN  XL) 150 MG 24 hr tablet 150 mg daily.     insulin  degludec (TRESIBA FLEXTOUCH) 200 UNIT/ML FlexTouch Pen Inject 35 Units into the skin daily.     JARDIANCE  25 MG TABS tablet Take 25 mg by mouth daily.     metoprolol  succinate (TOPROL -XL) 25 MG 24 hr tablet Take 1 tablet (25 mg total) by mouth daily. 60 tablet 1   nitroGLYCERIN  (NITROSTAT ) 0.4 MG SL tablet See admin instructions.     Nutritional Supplements (ENSURE MAX PROTEIN PO) Take 1 Can by mouth every other day.     VITAMIN D, CHOLECALCIFEROL, PO Take 1 tablet by mouth daily.     No current facility-administered medications for this encounter.   Allergies  Allergen Reactions   Sulfa Antibiotics Hives   Social History   Socioeconomic History   Marital status: Widowed    Spouse name: Not on file   Number of children: Not on file   Years of education: Not on file   Highest education level: Not on file  Occupational History   Occupation: retired   Tobacco Use   Smoking status: Never   Smokeless tobacco: Never  Vaping Use   Vaping status: Never Used  Substance and Sexual Activity   Alcohol  use: No   Drug use: No   Sexual  activity: Not on file  Other Topics Concern   Not on file  Social History Narrative   Not on file   Social Drivers of Health   Financial Resource Strain: Low Risk  (08/10/2020)   Overall Financial Resource Strain (CARDIA)    Difficulty of Paying Living Expenses: Not very hard  Food Insecurity: Not on file  Transportation Needs: No Transportation Needs (08/10/2020)   PRAPARE - Administrator, Civil Service (Medical): No    Lack of Transportation (Non-Medical): No  Physical Activity: Not on file  Stress: Not on file  Social Connections: Not on file  Intimate Partner Violence: Not on file   Family History  Problem Relation Age of Onset   Diabetes Mother    Heart attack Mother    Heart attack Father    BP 118/60    Pulse 60   Wt 73.9 kg (163 lb)   SpO2 97%   BMI 27.12 kg/m   Wt Readings from Last 3 Encounters:  09/03/23 73.9 kg (163 lb)  05/17/23 62.6 kg (138 lb)  03/19/23 68.9 kg (152 lb)   PHYSICAL EXAM: General:  Well appearing. No resp difficulty HEENT: normal Neck: supple. no JVD. Carotids 2+ bilat; no bruits. No lymphadenopathy or thryomegaly appreciated. Cor: PMI nondisplaced. Regular rate & rhythm. No rubs, gallops or murmurs. Lungs: clear Abdomen: soft, nontender, nondistended. No hepatosplenomegaly. No bruits or masses. Good bowel sounds. Extremities: no cyanosis, clubbing, rash, edema Neuro: alert & orientedx3, cranial nerves grossly intact. moves all 4 extremities w/o difficulty. Affect pleasant   Device interrogation: No VT/AF. Fluid ok. BiVpacing > 99% Personally reviewed    ASSESSMENT & PLAN:  1. Chronic Systolic CHF, Ischemic Cardiomyopathy - S/p CABG x5 in 3/22 - LVEF 55% on post-op TEE 3/25-->40-45% on TTE 08/01/20 - Echo (3/23): EF 45-50%, RV normal. Abnormal (paradoxical) septal motion, consistent with LBBB - Stable NYHA II - Continue Jardiance . - Continue Toprol  XL 25 mg daily.  - Did not tolerate losartan  due to dizziness. Will not rechallenge - Holding off of spiro with CKD 3-4 and recent dizziness/nausea. - Due for repeat echo   2. CAD s/p NSTEMI  - CABG x 5 in 3/22 as detailed above - No s/s ischemia - No ASA given Eliquis .  - Continue statin + beta blocker.  3. CKD 3b-IV - Baseline SCR 1.6-1.9 - Continue SGLT2i. - Previously declined referral to Nephrology.  - Repeat labs   4. CHB - S/P CRT-P- 07/27/20  - Followed in Device Clinic. - Septal dyssynchrony noted on echo 3/23.  - ICD interrogation as above - done personally in clinic   5. PAF - In NSR.  - Continue Eliquis  2.5 bid  - CHA2DS2-VASc of 5.  6. DM2 - PCP managing. - Continue SGLT2i.  7. Ataxia - prominent on exam concerning for cerebellar dysfunction - refer to Neurology for  w/u - refer PT/OT   Jules Oar, MD  3:52 PM

## 2023-09-03 NOTE — Patient Instructions (Signed)
 Great to see you today!!!  Medication Changes:  None, continue current medications  Lab Work:  Labs done today, your results will be available in MyChart, we will contact you for abnormal readings.   Testing/Procedures:  Your physician has requested that you have an echocardiogram. Echocardiography is a painless test that uses sound waves to create images of your heart. It provides your doctor with information about the size and shape of your heart and how well your heart's chambers and valves are working. This procedure takes approximately one hour. There are no restrictions for this procedure. Please do NOT wear cologne, perfume, aftershave, or lotions (deodorant is allowed). Please arrive 15 minutes prior to your appointment time. IN [place], THEY WILL CALL YOU TO SCHEDULE  Please note: We ask at that you not bring children with you during ultrasound (echo/ vascular) testing. Due to room size and safety concerns, children are not allowed in the ultrasound rooms during exams. Our front office staff cannot provide observation of children in our lobby area while testing is being conducted. An adult accompanying a patient to their appointment will only be allowed in the ultrasound room at the discretion of the ultrasound technician under special circumstances. We apologize for any inconvenience.   Special Instructions // Education:  Do the following things EVERYDAY: Weigh yourself in the morning before breakfast. Write it down and keep it in a log. Take your medicines as prescribed Eat low salt foods--Limit salt (sodium) to 2000 mg per day.  Stay as active as you can everyday Limit all fluids for the day to less than 2 liters   Follow-Up in: 1 year (May 2026), **PLEASE CALL OUR OFFICE IN FEBRUARY TO SCHEDULE THIS APPOINTMENT   At [facility], you and your health needs are our priority. We have a designated team specialized in the treatment of Heart Failure.  This Care Team includes your primary Heart Failure Specialized Cardiologist (physician), Advanced Practice Providers (APPs- Physician Assistants and Nurse Practitioners), and Pharmacist who all work together to provide you with the care you need, when you need it.   You may see any of the following providers on your designated Care Team at your next follow up:  Dr. Jules Oar Dr. Peder Bourdon Dr. Alwin Baars Dr. Judyth Nunnery Nieves Bars, NP Ruddy Corral, [place] [facility] [place], [place] Dennise Fitz, NP [place] Lee, NP Luster Salters, PharmD   Please be sure to bring in all your medications bottles to every appointment.   Need to Contact Us :  If you have any questions or concerns before your next appointment please send us  a message through [place] or call our office at [phone].    TO LEAVE A MESSAGE FOR THE NURSE SELECT OPTION 2, PLEASE LEAVE A MESSAGE INCLUDING: YOUR NAME DATE OF BIRTH CALL BACK NUMBER REASON FOR CALL**this is important as we prioritize the call backs  YOU WILL RECEIVE A CALL BACK THE SAME DAY AS LONG AS YOU CALL BEFORE 4:00 PM

## 2023-09-12 NOTE — Progress Notes (Signed)
 Remote pacemaker transmission.

## 2023-09-27 ENCOUNTER — Encounter: Payer: Self-pay | Admitting: Internal Medicine

## 2023-10-02 ENCOUNTER — Ambulatory Visit: Attending: Internal Medicine

## 2023-10-02 DIAGNOSIS — I5022 Chronic systolic (congestive) heart failure: Secondary | ICD-10-CM | POA: Diagnosis not present

## 2023-10-03 LAB — ECHOCARDIOGRAM COMPLETE
AR max vel: 2.37 cm2
AV Area VTI: 2.67 cm2
AV Area mean vel: 2.25 cm2
AV Mean grad: 2.5 mmHg
AV Peak grad: 5.3 mmHg
AV Vena cont: 0.2 cm
Ao pk vel: 1.16 m/s
Area-P 1/2: 2.95 cm2
P 1/2 time: 1050 ms
S' Lateral: 3 cm

## 2023-10-28 ENCOUNTER — Ambulatory Visit: Payer: Self-pay | Admitting: Cardiology

## 2023-10-28 LAB — CUP PACEART REMOTE DEVICE CHECK
Battery Remaining Longevity: 26 mo
Battery Remaining Percentage: 42 %
Battery Voltage: 2.95 V
Brady Statistic AP VP Percent: 15 %
Brady Statistic AP VS Percent: 1 %
Brady Statistic AS VP Percent: 85 %
Brady Statistic AS VS Percent: 1 %
Brady Statistic RA Percent Paced: 15 %
Date Time Interrogation Session: 20250630020010
Implantable Lead Connection Status: 753985
Implantable Lead Connection Status: 753985
Implantable Lead Connection Status: 753985
Implantable Lead Implant Date: 20220330
Implantable Lead Implant Date: 20220330
Implantable Lead Implant Date: 20220330
Implantable Lead Location: 753858
Implantable Lead Location: 753859
Implantable Lead Location: 753860
Implantable Pulse Generator Implant Date: 20220330
Lead Channel Impedance Value: 360 Ohm
Lead Channel Impedance Value: 590 Ohm
Lead Channel Impedance Value: 590 Ohm
Lead Channel Pacing Threshold Amplitude: 0.625 V
Lead Channel Pacing Threshold Amplitude: 1 V
Lead Channel Pacing Threshold Amplitude: 1.75 V
Lead Channel Pacing Threshold Pulse Width: 0.5 ms
Lead Channel Pacing Threshold Pulse Width: 0.5 ms
Lead Channel Pacing Threshold Pulse Width: 1 ms
Lead Channel Sensing Intrinsic Amplitude: 12 mV
Lead Channel Sensing Intrinsic Amplitude: 2.7 mV
Lead Channel Setting Pacing Amplitude: 2 V
Lead Channel Setting Pacing Amplitude: 2.5 V
Lead Channel Setting Pacing Amplitude: 2.75 V
Lead Channel Setting Pacing Pulse Width: 0.5 ms
Lead Channel Setting Pacing Pulse Width: 1 ms
Lead Channel Setting Sensing Sensitivity: 4 mV
Pulse Gen Model: 3562
Pulse Gen Serial Number: 3862449

## 2023-11-26 LAB — LAB REPORT - SCANNED
A1c: 6.8
EGFR: 34.3

## 2023-12-27 LAB — LAB REPORT - SCANNED: EGFR: 29.7

## 2024-01-27 ENCOUNTER — Ambulatory Visit: Payer: Self-pay

## 2024-01-27 DIAGNOSIS — I5022 Chronic systolic (congestive) heart failure: Secondary | ICD-10-CM

## 2024-01-29 ENCOUNTER — Ambulatory Visit: Payer: Self-pay | Admitting: Cardiology

## 2024-01-29 LAB — CUP PACEART REMOTE DEVICE CHECK
Battery Remaining Longevity: 22 mo
Battery Remaining Percentage: 37 %
Battery Voltage: 2.93 V
Brady Statistic AP VP Percent: 10 %
Brady Statistic AP VS Percent: 1 %
Brady Statistic AS VP Percent: 90 %
Brady Statistic AS VS Percent: 1 %
Brady Statistic RA Percent Paced: 10 %
Date Time Interrogation Session: 20250929033047
Implantable Lead Connection Status: 753985
Implantable Lead Connection Status: 753985
Implantable Lead Connection Status: 753985
Implantable Lead Implant Date: 20220330
Implantable Lead Implant Date: 20220330
Implantable Lead Implant Date: 20220330
Implantable Lead Location: 753858
Implantable Lead Location: 753859
Implantable Lead Location: 753860
Implantable Pulse Generator Implant Date: 20220330
Lead Channel Impedance Value: 410 Ohm
Lead Channel Impedance Value: 550 Ohm
Lead Channel Impedance Value: 580 Ohm
Lead Channel Pacing Threshold Amplitude: 0.5 V
Lead Channel Pacing Threshold Amplitude: 1 V
Lead Channel Pacing Threshold Amplitude: 2.125 V
Lead Channel Pacing Threshold Pulse Width: 0.5 ms
Lead Channel Pacing Threshold Pulse Width: 0.5 ms
Lead Channel Pacing Threshold Pulse Width: 1 ms
Lead Channel Sensing Intrinsic Amplitude: 12 mV
Lead Channel Sensing Intrinsic Amplitude: 2.5 mV
Lead Channel Setting Pacing Amplitude: 2 V
Lead Channel Setting Pacing Amplitude: 2.5 V
Lead Channel Setting Pacing Amplitude: 3.125
Lead Channel Setting Pacing Pulse Width: 0.5 ms
Lead Channel Setting Pacing Pulse Width: 1 ms
Lead Channel Setting Sensing Sensitivity: 4 mV
Pulse Gen Model: 3562
Pulse Gen Serial Number: 3862449

## 2024-01-29 NOTE — Progress Notes (Signed)
 Remote PPM Transmission

## 2024-04-27 ENCOUNTER — Ambulatory Visit (INDEPENDENT_AMBULATORY_CARE_PROVIDER_SITE_OTHER): Payer: Self-pay

## 2024-04-27 DIAGNOSIS — I442 Atrioventricular block, complete: Secondary | ICD-10-CM | POA: Diagnosis not present

## 2024-04-28 LAB — CUP PACEART REMOTE DEVICE CHECK
Battery Remaining Longevity: 18 mo
Battery Remaining Percentage: 32 %
Battery Voltage: 2.92 V
Brady Statistic AP VP Percent: 8.3 %
Brady Statistic AP VS Percent: 1 %
Brady Statistic AS VP Percent: 92 %
Brady Statistic AS VS Percent: 1 %
Brady Statistic RA Percent Paced: 8.2 %
Date Time Interrogation Session: 20251229020009
Implantable Lead Connection Status: 753985
Implantable Lead Connection Status: 753985
Implantable Lead Connection Status: 753985
Implantable Lead Implant Date: 20220330
Implantable Lead Implant Date: 20220330
Implantable Lead Implant Date: 20220330
Implantable Lead Location: 753858
Implantable Lead Location: 753859
Implantable Lead Location: 753860
Implantable Pulse Generator Implant Date: 20220330
Lead Channel Impedance Value: 400 Ohm
Lead Channel Impedance Value: 510 Ohm
Lead Channel Impedance Value: 530 Ohm
Lead Channel Pacing Threshold Amplitude: 0.5 V
Lead Channel Pacing Threshold Amplitude: 1 V
Lead Channel Pacing Threshold Amplitude: 2.125 V
Lead Channel Pacing Threshold Pulse Width: 0.5 ms
Lead Channel Pacing Threshold Pulse Width: 0.5 ms
Lead Channel Pacing Threshold Pulse Width: 1 ms
Lead Channel Sensing Intrinsic Amplitude: 12 mV
Lead Channel Sensing Intrinsic Amplitude: 3 mV
Lead Channel Setting Pacing Amplitude: 2 V
Lead Channel Setting Pacing Amplitude: 2.5 V
Lead Channel Setting Pacing Amplitude: 3.125
Lead Channel Setting Pacing Pulse Width: 0.5 ms
Lead Channel Setting Pacing Pulse Width: 1 ms
Lead Channel Setting Sensing Sensitivity: 4 mV
Pulse Gen Model: 3562
Pulse Gen Serial Number: 3862449

## 2024-05-02 ENCOUNTER — Ambulatory Visit: Payer: Self-pay | Admitting: Cardiology

## 2024-05-06 NOTE — Progress Notes (Signed)
 Remote PPM Transmission
